# Patient Record
Sex: Female | Born: 1950
Health system: Southern US, Community
[De-identification: ages and names within clinical notes are randomized; demographics above are authoritative.]

## PROBLEM LIST (undated history)

## (undated) DIAGNOSIS — R232 Flushing: Secondary | ICD-10-CM

## (undated) DIAGNOSIS — C4490 Unspecified malignant neoplasm of skin, unspecified: Secondary | ICD-10-CM

## (undated) DIAGNOSIS — Z86006 Personal history of melanoma in-situ: Secondary | ICD-10-CM

## (undated) DIAGNOSIS — J301 Allergic rhinitis due to pollen: Secondary | ICD-10-CM

## (undated) DIAGNOSIS — D649 Anemia, unspecified: Secondary | ICD-10-CM

## (undated) DIAGNOSIS — R059 Cough, unspecified: Secondary | ICD-10-CM

## (undated) DIAGNOSIS — Z973 Presence of spectacles and contact lenses: Secondary | ICD-10-CM

## (undated) DIAGNOSIS — Z808 Family history of malignant neoplasm of other organs or systems: Secondary | ICD-10-CM

## (undated) DIAGNOSIS — F3289 Other specified depressive episodes: Secondary | ICD-10-CM

## (undated) DIAGNOSIS — G4700 Insomnia, unspecified: Secondary | ICD-10-CM

## (undated) DIAGNOSIS — F329 Major depressive disorder, single episode, unspecified: Secondary | ICD-10-CM

## (undated) DIAGNOSIS — C50919 Malignant neoplasm of unspecified site of unspecified female breast: Secondary | ICD-10-CM

## (undated) DIAGNOSIS — G2581 Restless legs syndrome: Secondary | ICD-10-CM

## (undated) DIAGNOSIS — T7840XA Allergy, unspecified, initial encounter: Secondary | ICD-10-CM

## (undated) DIAGNOSIS — K219 Gastro-esophageal reflux disease without esophagitis: Secondary | ICD-10-CM

## (undated) DIAGNOSIS — R635 Abnormal weight gain: Secondary | ICD-10-CM

## (undated) DIAGNOSIS — M722 Plantar fascial fibromatosis: Secondary | ICD-10-CM

## (undated) DIAGNOSIS — IMO0002 Reserved for concepts with insufficient information to code with codable children: Secondary | ICD-10-CM

## (undated) DIAGNOSIS — C439 Malignant melanoma of skin, unspecified: Secondary | ICD-10-CM

## (undated) DIAGNOSIS — IMO0001 Reserved for inherently not codable concepts without codable children: Secondary | ICD-10-CM

## (undated) DIAGNOSIS — R05 Cough: Secondary | ICD-10-CM

## (undated) HISTORY — DX: Unspecified malignant neoplasm of skin, unspecified: C44.90

## (undated) HISTORY — PX: TONSILLECTOMY: SUR1361

## (undated) HISTORY — DX: Insomnia, unspecified: G47.00

## (undated) HISTORY — DX: Allergic rhinitis due to pollen: J30.1

## (undated) HISTORY — DX: Allergy, unspecified, initial encounter: T78.40XA

## (undated) HISTORY — DX: Cough, unspecified: R05.9

## (undated) HISTORY — DX: Other specified depressive episodes: F32.89

## (undated) HISTORY — DX: Major depressive disorder, single episode, unspecified: F32.9

## (undated) HISTORY — DX: Family history of malignant neoplasm of other organs or systems: Z80.8

## (undated) HISTORY — DX: Personal history of melanoma in-situ: Z86.006

## (undated) HISTORY — PX: COLONOSCOPY: SHX174

## (undated) HISTORY — DX: Cough: R05

## (undated) HISTORY — DX: Abnormal weight gain: R63.5

## (undated) HISTORY — DX: Plantar fascial fibromatosis: M72.2

## (undated) HISTORY — DX: Malignant neoplasm of unspecified site of unspecified female breast: C50.919

## (undated) HISTORY — PX: MELANOMA EXCISION: SHX5266

## (undated) HISTORY — DX: Malignant melanoma of skin, unspecified: C43.9

## (undated) HISTORY — DX: Flushing: R23.2

## (undated) HISTORY — DX: Reserved for inherently not codable concepts without codable children: IMO0001

## (undated) HISTORY — DX: Restless legs syndrome: G25.81

## (undated) HISTORY — DX: Reserved for concepts with insufficient information to code with codable children: IMO0002

---

## 1997-12-06 ENCOUNTER — Other Ambulatory Visit: Admission: RE | Admit: 1997-12-06 | Discharge: 1997-12-06 | Payer: Self-pay | Admitting: *Deleted

## 1998-03-10 DIAGNOSIS — C439 Malignant melanoma of skin, unspecified: Secondary | ICD-10-CM

## 1998-03-10 HISTORY — DX: Malignant melanoma of skin, unspecified: C43.9

## 1998-03-31 ENCOUNTER — Encounter: Payer: Self-pay | Admitting: Family Medicine

## 1998-03-31 ENCOUNTER — Ambulatory Visit (HOSPITAL_COMMUNITY): Admission: RE | Admit: 1998-03-31 | Discharge: 1998-03-31 | Payer: Self-pay | Admitting: Family Medicine

## 1998-05-08 DIAGNOSIS — D229 Melanocytic nevi, unspecified: Secondary | ICD-10-CM

## 1998-05-08 HISTORY — DX: Melanocytic nevi, unspecified: D22.9

## 1999-05-20 ENCOUNTER — Ambulatory Visit (HOSPITAL_COMMUNITY): Admission: RE | Admit: 1999-05-20 | Discharge: 1999-05-20 | Payer: Self-pay | Admitting: Family Medicine

## 1999-05-20 ENCOUNTER — Encounter: Payer: Self-pay | Admitting: Family Medicine

## 2000-01-04 ENCOUNTER — Other Ambulatory Visit: Admission: RE | Admit: 2000-01-04 | Discharge: 2000-01-04 | Payer: Self-pay | Admitting: *Deleted

## 2001-02-14 ENCOUNTER — Other Ambulatory Visit: Admission: RE | Admit: 2001-02-14 | Discharge: 2001-02-14 | Payer: Self-pay | Admitting: Obstetrics and Gynecology

## 2001-08-09 ENCOUNTER — Ambulatory Visit (HOSPITAL_COMMUNITY): Admission: RE | Admit: 2001-08-09 | Discharge: 2001-08-09 | Payer: Self-pay | Admitting: Family Medicine

## 2001-08-09 ENCOUNTER — Encounter: Payer: Self-pay | Admitting: Family Medicine

## 2002-06-20 ENCOUNTER — Other Ambulatory Visit: Admission: RE | Admit: 2002-06-20 | Discharge: 2002-06-20 | Payer: Self-pay | Admitting: Family Medicine

## 2002-07-31 ENCOUNTER — Ambulatory Visit (HOSPITAL_COMMUNITY): Admission: RE | Admit: 2002-07-31 | Discharge: 2002-07-31 | Payer: Self-pay | Admitting: Gastroenterology

## 2002-07-31 ENCOUNTER — Encounter: Payer: Self-pay | Admitting: Gastroenterology

## 2005-02-03 ENCOUNTER — Other Ambulatory Visit: Admission: RE | Admit: 2005-02-03 | Discharge: 2005-02-03 | Payer: Self-pay | Admitting: *Deleted

## 2005-08-07 ENCOUNTER — Emergency Department (HOSPITAL_COMMUNITY): Admission: EM | Admit: 2005-08-07 | Discharge: 2005-08-08 | Payer: Self-pay | Admitting: Emergency Medicine

## 2005-12-15 DIAGNOSIS — D039 Melanoma in situ, unspecified: Secondary | ICD-10-CM

## 2005-12-15 HISTORY — DX: Melanoma in situ, unspecified: D03.9

## 2006-02-17 ENCOUNTER — Other Ambulatory Visit: Admission: RE | Admit: 2006-02-17 | Discharge: 2006-02-17 | Payer: Self-pay | Admitting: *Deleted

## 2006-02-23 ENCOUNTER — Emergency Department (HOSPITAL_COMMUNITY): Admission: EM | Admit: 2006-02-23 | Discharge: 2006-02-24 | Payer: Self-pay | Admitting: Emergency Medicine

## 2007-06-27 ENCOUNTER — Other Ambulatory Visit: Admission: RE | Admit: 2007-06-27 | Discharge: 2007-06-27 | Payer: Self-pay | Admitting: Family Medicine

## 2008-04-22 ENCOUNTER — Encounter: Admission: RE | Admit: 2008-04-22 | Discharge: 2008-04-22 | Payer: Self-pay | Admitting: Family Medicine

## 2010-07-31 ENCOUNTER — Encounter: Payer: Self-pay | Admitting: Critical Care Medicine

## 2010-08-03 ENCOUNTER — Ambulatory Visit (INDEPENDENT_AMBULATORY_CARE_PROVIDER_SITE_OTHER): Payer: 59 | Admitting: Critical Care Medicine

## 2010-08-03 ENCOUNTER — Other Ambulatory Visit: Payer: 59

## 2010-08-03 ENCOUNTER — Encounter: Payer: Self-pay | Admitting: Critical Care Medicine

## 2010-08-03 DIAGNOSIS — F3289 Other specified depressive episodes: Secondary | ICD-10-CM

## 2010-08-03 DIAGNOSIS — R059 Cough, unspecified: Secondary | ICD-10-CM

## 2010-08-03 DIAGNOSIS — G4700 Insomnia, unspecified: Secondary | ICD-10-CM

## 2010-08-03 DIAGNOSIS — J301 Allergic rhinitis due to pollen: Secondary | ICD-10-CM

## 2010-08-03 DIAGNOSIS — F329 Major depressive disorder, single episode, unspecified: Secondary | ICD-10-CM

## 2010-08-03 DIAGNOSIS — R05 Cough: Secondary | ICD-10-CM

## 2010-08-03 DIAGNOSIS — J45991 Cough variant asthma: Secondary | ICD-10-CM | POA: Insufficient documentation

## 2010-08-03 DIAGNOSIS — K219 Gastro-esophageal reflux disease without esophagitis: Secondary | ICD-10-CM

## 2010-08-03 LAB — IGE: IgE (Immunoglobulin E), Serum: 1.9 IU/mL (ref 0.0–180.0)

## 2010-08-03 MED ORDER — OMEPRAZOLE 20 MG PO CPDR: 20.0000 mg | DELAYED_RELEASE_CAPSULE | Freq: Every day | ORAL | Status: DC

## 2010-08-03 NOTE — Progress Notes (Signed)
Subjective:    [name], female    DOB: 1950-06-14, 60 y.o.   MRN: [id number]  60 y.o.    WF referred for cough and wheezing.  Cough This is a chronic (January 2012) problem. The current episode started more than 1 month ago. The problem has been waxing and waning (comes and goes, and is dry). Episode frequency: when exacerbates is constant.  Last spell was may 2012. The cough is non-productive. Associated symptoms include shortness of breath and wheezing. Pertinent negatives include no chest pain, chills, ear congestion, ear pain, fever, headaches, heartburn, hemoptysis, myalgias, nasal congestion, postnasal drip, rash, rhinorrhea, sore throat, sweats or weight loss. Associated symptoms comments: Chest tightness, notes wheezing  No clearing of throat Weight is up 30# in 1 year No sneezing, eyes were itching. The symptoms are aggravated by cold air, pollens, fumes and lying down. She has tried a beta-agonist inhaler, steroid inhaler and oral steroids for the symptoms. The treatment provided significant relief. Her past medical history is significant for environmental allergies. There is no history of asthma, bronchiectasis, bronchitis, emphysema or pneumonia. Allergy testing 5 yrs ago, positive dust , grasses, weeds. No immunotherapy given    .  Past Medical History  Diagnosis Date  . Cough   . Weight gain   . Hot flashes   . Insomnia, unspecified   . Depressive disorder, not elsewhere classified   . Allergic rhinitis due to pollen   . Plantar fasciitis      Family History  Problem Relation Age of Onset  . Alcohol abuse Father   . Emphysema Mother   . Heart disease Mother   . Heart failure Mother   . Asthma Daughter   . Asthma Other     grandson  . Asthma Sister      History   Social History  . Marital Status: Married    Spouse Name: N/A    Number of Children: 2  . Years of Education: N/A   Occupational History  . Research scientist (medical)    Social  History Main Topics  . Smoking status: Former Smoker -- 0.2 packs/day for 6 years    Types: Cigarettes    Quit date: 01/25/1974  . Smokeless tobacco: Never Used  . Alcohol Use: Yes     1 glass wine monthly  . Drug Use: No  . Sexually Active: Not on file   Other Topics Concern  . Not on file   Social History Narrative  . No narrative on file     Allergies  Allergen Reactions  . Iodine Hives  . Sulfa Antibiotics Hives     Outpatient Prescriptions Prior to Visit  Medication Sig Dispense Refill  . cetirizine (ZYRTEC) 10 MG tablet Take 10 mg by mouth daily.        . chlorpheniramine-HYDROcodone (TUSSIONEX PENNKINETIC ER) 10-8 MG/5ML LQCR Take 5 mLs by mouth at bedtime as needed.        . Cholecalciferol (VITAMIN D) 1000 UNITS capsule Take 4,000 Units by mouth daily.       . DULoxetine (CYMBALTA) 60 MG capsule Take 60 mg by mouth daily.        Marland Kitchen estrogen, conjugated,-medroxyprogesterone (PREMPRO) 0.3-1.5 MG per tablet Take 1 tablet by mouth daily.        . mometasone (NASONEX) 50 MCG/ACT nasal spray Place 2 sprays into the nose daily.        Marland Kitchen zolpidem (AMBIEN) 10 MG tablet Take 10 mg by mouth at  bedtime.       . cyclobenzaprine (FLEXERIL) 10 MG tablet Take 10 mg by mouth at bedtime as needed.        Marland Kitchen azelastine (ASTELIN) 137 MCG/SPRAY nasal spray Place 1 spray into the nose 2 (two) times daily. Use in each nostril as directed       . budesonide-formoterol (SYMBICORT) 160-4.5 MCG/ACT inhaler Inhale 2 puffs into the lungs 2 (two) times daily.        . calcium carbonate (OS-CAL) 600 MG TABS Take 600 mg by mouth. As directed        . esomeprazole (NEXIUM) 40 MG capsule Take 40 mg by mouth daily before breakfast.           Review of Systems  Constitutional: Positive for activity change, appetite change, fatigue and unexpected weight change. Negative for fever, chills, weight loss and diaphoresis.       30# weight gain Stays tired all the time Snores and is recent  Not refreshed  in the morning  HENT: Positive for sinus pressure. Negative for hearing loss, ear pain, nosebleeds, congestion, sore throat, facial swelling, rhinorrhea, sneezing, mouth sores, trouble swallowing, neck pain, neck stiffness, dental problem, voice change, postnasal drip, tinnitus and ear discharge.   Eyes: Positive for itching. Negative for photophobia, discharge and visual disturbance.  Respiratory: Positive for cough, chest tightness, shortness of breath and wheezing. Negative for apnea, hemoptysis, choking and stridor.   Cardiovascular: Positive for leg swelling. Negative for chest pain and palpitations.  Gastrointestinal: Negative for heartburn, nausea, vomiting, abdominal pain, constipation, blood in stool and abdominal distention.  Genitourinary: Negative for dysuria, urgency, frequency, hematuria, flank pain, decreased urine volume and difficulty urinating.  Musculoskeletal: Positive for back pain. Negative for myalgias, joint swelling, arthralgias and gait problem.  Skin: Negative for color change, pallor and rash.  Neurological: Positive for tremors. Negative for dizziness, seizures, syncope, speech difficulty, weakness, light-headedness, numbness and headaches.  Hematological: Positive for environmental allergies. Negative for adenopathy. Does not bruise/bleed easily.  Psychiatric/Behavioral: Positive for confusion and sleep disturbance. Negative for agitation. The patient is nervous/anxious.        Objective:   Physical Exam Filed Vitals:   08/03/10 1013  BP: 116/74  Pulse: 71  Temp: 98 F (36.7 C)  TempSrc: Oral  Height: 5\' 5"  (1.651 m)  Weight: 175 lb 9.6 oz (79.652 kg)  SpO2: 96%    Gen: Pleasant, well-nourished, in no distress,  normal affect  ENT: No lesions,  mouth clear,  oropharynx clear, no postnasal drip  Neck: No JVD, no TMG, no carotid bruits  Lungs: No use of accessory muscles, no dullness to percussion, clear without rales or rhonchi  Cardiovascular: RRR,  heart sounds normal, no murmur or gallops, no peripheral edema  Abdomen: soft and NT, no HSM,  BS normal  Musculoskeletal: No deformities, no cyanosis or clubbing  Neuro: alert, non focal  Skin: Warm, no lesions or rashes      08/03/10 normal spirometry CXR NAD 6/12  Assessment & Plan:   Cough Cyclical cough , suspect reactive airway disease d/t GERD and allergic atopic factors Note spirometry normal today  Plan Stay on pulmicort Stop nexium Start omeprazole one daily Follow reflux diet A sleep study will be obtained Labs today: IgE level Return 6 weeks     Updated Medication List Outpatient Encounter Prescriptions as of 08/03/2010  Medication Sig Dispense Refill  . Azelastine HCl (ASTEPRO) 0.15 % SOLN Place 1 spray into the nose 2 (two) times  daily.        . budesonide (PULMICORT FLEXHALER) 180 MCG/ACT inhaler Inhale 2 puffs into the lungs 2 (two) times daily.        . cetirizine (ZYRTEC) 10 MG tablet Take 10 mg by mouth daily.        . chlorpheniramine-HYDROcodone (TUSSIONEX PENNKINETIC ER) 10-8 MG/5ML LQCR Take 5 mLs by mouth at bedtime as needed.        . Cholecalciferol (VITAMIN D) 1000 UNITS capsule Take 4,000 Units by mouth daily.       . DULoxetine (CYMBALTA) 60 MG capsule Take 60 mg by mouth daily.        Marland Kitchen estrogen, conjugated,-medroxyprogesterone (PREMPRO) 0.3-1.5 MG per tablet Take 1 tablet by mouth daily.        . mometasone (NASONEX) 50 MCG/ACT nasal spray Place 2 sprays into the nose daily.        Marland Kitchen zolpidem (AMBIEN) 10 MG tablet Take 10 mg by mouth at bedtime.       . cyclobenzaprine (FLEXERIL) 10 MG tablet Take 10 mg by mouth at bedtime as needed.        Marland Kitchen omeprazole (PRILOSEC) 20 MG capsule Take 1 capsule (20 mg total) by mouth daily.  30 capsule  6  . DISCONTD: azelastine (ASTELIN) 137 MCG/SPRAY nasal spray Place 1 spray into the nose 2 (two) times daily. Use in each nostril as directed       . DISCONTD: budesonide-formoterol (SYMBICORT) 160-4.5 MCG/ACT  inhaler Inhale 2 puffs into the lungs 2 (two) times daily.        Marland Kitchen DISCONTD: calcium carbonate (OS-CAL) 600 MG TABS Take 600 mg by mouth. As directed        . DISCONTD: esomeprazole (NEXIUM) 40 MG capsule Take 40 mg by mouth daily before breakfast.

## 2010-08-03 NOTE — Patient Instructions (Signed)
Stay on pulmicort Stop nexium Start omeprazole one daily Follow reflux diet A sleep study will be obtained Labs today: IgE level Return 6 weeks

## 2010-08-03 NOTE — Assessment & Plan Note (Addendum)
Cyclical cough , suspect reactive airway disease d/t GERD and allergic atopic factors Note spirometry normal today  Plan Stay on pulmicort Stop nexium Start omeprazole one daily Follow reflux diet A sleep study will be obtained Labs today: IgE level Return 6 weeks

## 2010-08-04 NOTE — Progress Notes (Signed)
Quick Note:  Call pt and tell her labs IgE levels are normal. No evidence allergies are driving her symptoms now. No change in medications based upon our recs at last OV  ______

## 2010-08-06 ENCOUNTER — Ambulatory Visit (HOSPITAL_BASED_OUTPATIENT_CLINIC_OR_DEPARTMENT_OTHER): Payer: 59 | Attending: Critical Care Medicine

## 2010-08-06 DIAGNOSIS — G4700 Insomnia, unspecified: Secondary | ICD-10-CM

## 2010-08-06 DIAGNOSIS — R0989 Other specified symptoms and signs involving the circulatory and respiratory systems: Secondary | ICD-10-CM | POA: Insufficient documentation

## 2010-08-06 DIAGNOSIS — G4710 Hypersomnia, unspecified: Secondary | ICD-10-CM | POA: Insufficient documentation

## 2010-08-06 DIAGNOSIS — R5381 Other malaise: Secondary | ICD-10-CM | POA: Insufficient documentation

## 2010-08-06 DIAGNOSIS — R0609 Other forms of dyspnea: Secondary | ICD-10-CM | POA: Insufficient documentation

## 2010-08-06 DIAGNOSIS — G4734 Idiopathic sleep related nonobstructive alveolar hypoventilation: Secondary | ICD-10-CM | POA: Insufficient documentation

## 2010-08-06 DIAGNOSIS — R51 Headache: Secondary | ICD-10-CM | POA: Insufficient documentation

## 2010-08-07 NOTE — Progress Notes (Signed)
Quick Note:  Called, spoke with pt. She is aware of lab results and recs per PW. She verbalized understanding of this. ______

## 2010-08-13 DIAGNOSIS — R0609 Other forms of dyspnea: Secondary | ICD-10-CM

## 2010-08-13 DIAGNOSIS — R0989 Other specified symptoms and signs involving the circulatory and respiratory systems: Secondary | ICD-10-CM

## 2010-08-13 DIAGNOSIS — R5381 Other malaise: Secondary | ICD-10-CM

## 2010-08-13 DIAGNOSIS — G4710 Hypersomnia, unspecified: Secondary | ICD-10-CM

## 2010-08-13 DIAGNOSIS — R51 Headache: Secondary | ICD-10-CM

## 2010-08-13 DIAGNOSIS — R5383 Other fatigue: Secondary | ICD-10-CM

## 2010-08-13 DIAGNOSIS — G4734 Idiopathic sleep related nonobstructive alveolar hypoventilation: Secondary | ICD-10-CM

## 2010-08-13 NOTE — Procedures (Signed)
NAME:  Theresa Huff, Theresa Huff NO.:  [id number]  MEDICAL RECORD NO.:  [id number]          PATIENT TYPE:  OUT  LOCATION:  SLEEP CENTER                 FACILITY:  [facility]  PHYSICIAN:  Oretha Milch, MD      DATE OF BIRTH:  August 18, 1950  DATE OF STUDY:  08/06/2010                           NOCTURNAL POLYSOMNOGRAM  REFERRING PHYSICIAN:  Charlcie Cradle. Delford Field, MD, FCCP  INDICATION FOR STUDY:  Ms. Petrakis is a 60 year old woman with excessive daytime somnolence and fatigue, loud snoring, restless sleep, and morning headaches.  At the time of this study, she weighed 170 pounds with a height of 5 feet and 5 inches, BMI of 28, neck size 14 inches.  EPWORTH SLEEPINESS SCORE:  19.  This nocturnal polysomnogram was performed with a sleep technologist in attendance.  EEG, EOG, EMG, EKG, respiratory parameters, and then limb movements were recorded.  Sleep stages arousals, limb movements, and respiratory data were scored according to criteria laid out by the American Academy of Sleep Medicine.  MEDICATIONS:  Include Zyrtec, Cymbalta, Astepro, Ambien, Pulmicort, Prilosec, vitamin D.  Ambien was not taken the night of the study.  SLEEP ARCHITECTURE:  Lights out was at 10:53 p.m., lights on was at 5:12 a.m.  Total sleep time was 317 minutes with a sleep period time of 368 minutes and sleep efficiency of 84%.  Sleep latency was 10 minutes. Latency to REM sleep was 91 minutes.  Wake after sleep onset was 53 minutes.  Sleep stages as a percentage of total sleep time was N1 13%, N2 58%, N3 0.6%, and REM sleep 28% (90 minutes).  Supine sleep accounted for 225 minutes and supine REM sleep for 77 minutes.  REM sleep was noted in 3 stages with the longest being around 4:30 a.m.  AROUSAL DATA:  There were 122 arousals with an arousal index of 23 events per hour.  Most of these arousals were spontaneous.  RESPIRATORY DATA:  There were 0 obstructive apneas, 4 central apneas, 0 mixed apneas, and 7  hypopneas with an apnea/hypopnea index of 2 events per hour, 50 respiratory effort related arousals (RERAs) were noted with an RDI of 11 events per hour.  Longest hypopnea was 35 seconds.  OXYGEN DATA:  The desaturation index was 2.46 events per hour.  The lowest desaturation was 84%.  She spent 1-minute with a saturation less than 88%.  CARDIAC DATA:  No arrhythmias were noted.  The low heart rate was 32 beats per minute.  The high heart rate recorded was an artifact.  MOVEMENT-PARASOMNIA:  Periodic limb movement index was 0.8 events per hour, only one limb movement was associated with arousals.  DISCUSSION:  Normal sleep architecture with a good period of supine REM sleep, indicated of a good study.  She did not meet criteria for intervention.  IMPRESSIONS-RECOMMENDATIONS: 1. Predominant RERAs suggestive of upper airway resistance syndrome. 2. Mild sleep-related hypoxia, however, she did not meet criteria for     oxygen intervention. 3. No evidence of cardiac arrhythmias, significant limb movements, or     behavioral disturbance during sleep.  RECOMMENDATIONS:  Treatment options include weight loss, oral appliance or CPAP therapy. CPAP would be  recommended only if she is symptomatic. Rules of sleep hygiene should be discussed. Medications should be reviewed for sedative side effects. Other causes of daytime somnolence can be correlated clinically.     Oretha Milch, MD Electronically Signed    RVA/MEDQ  D:  08/13/2010 10:41:00  T:  08/13/2010 21:49:30  Job:  [id number]

## 2010-08-21 NOTE — Progress Notes (Signed)
Quick Note:  Pt has appt to see VS on 09/09/10. ______

## 2010-09-09 ENCOUNTER — Ambulatory Visit (INDEPENDENT_AMBULATORY_CARE_PROVIDER_SITE_OTHER): Payer: 59 | Admitting: Pulmonary Disease

## 2010-09-09 ENCOUNTER — Encounter: Payer: Self-pay | Admitting: Pulmonary Disease

## 2010-09-09 VITALS — BP 108/64 | HR 68 | Temp 98.1°F | Wt 176.6 lb

## 2010-09-09 DIAGNOSIS — G4733 Obstructive sleep apnea (adult) (pediatric): Secondary | ICD-10-CM

## 2010-09-09 DIAGNOSIS — G4700 Insomnia, unspecified: Secondary | ICD-10-CM

## 2010-09-09 DIAGNOSIS — G2581 Restless legs syndrome: Secondary | ICD-10-CM

## 2010-09-09 HISTORY — DX: Restless legs syndrome: G25.81

## 2010-09-09 NOTE — Assessment & Plan Note (Signed)
She has complaints consistent with restless leg syndrome.  She has prior history of iron deficiency.  Will check her lab tests, and start iron supplementation if her iron levels are low.  Otherwise, would try to optimize her therapy for sleep apnea first.  Of note is that she is using cymbalta.  This class of anti-depressant medications can be associated with restless legs syndrome.  Will defer to primary care whether this can be switched to a different class of anti-depressant medication, or if she can be weaned of this medication.

## 2010-09-09 NOTE — Assessment & Plan Note (Addendum)
She has mild sleep apnea.  She has sleep disruption and daytime sleepiness.  I have reviewed his sleep test results with the patient.  Explained how sleep apnea can affect the patient's health.  Driving precautions and importance of weight loss were discussed.  Treatment options for sleep apnea were reviewed.  She would like to try an oral appliance.  I will refer to Dr. Althea Grimmer to get assessed for this.  Explained that she would need to have further testing with device in place to document efficacy of therapy.

## 2010-09-09 NOTE — Assessment & Plan Note (Signed)
Much of her insomnia symptoms could actually be related to sleep apnea and restless leg syndrome.  Will get these stabilized first, and then determine if she needs to continue with chronic sleep aide therapy.

## 2010-09-09 NOTE — Patient Instructions (Signed)
Will arrange for dental evaluation to assess for oral appliance to treat sleep apnea Lab tests today>>will call with results Follow up in 3 months

## 2010-09-09 NOTE — Progress Notes (Signed)
Subjective:    [name], female    DOB: [birth year], 60 y.o.   MRN: [id number]  HPI  60 yo female with sleep problems.  She has noticed trouble with her sleep for years.  She has trouble falling asleep and staying asleep.  She is then sleepy during the day.  She has been using Theresa Huff regularly for some time, but still has trouble.  She has not used anything else to help her sleep.  She goes to bed at 10 pm.  She takes ambien at 930 pm.  She falls asleep in about 30 minutes.  She wakes up once, but then quickly falls back to sleep.  She gets out of bed at 630 am on weekdays, but sleeps in to 730 am on weekends.  She feels tired in the morning, and gets frequent headaches.  She drinks a cup of tea in the morning.  She will nap for up to 2 hours on the weekend, but this does not seem to help much.  She has gained about 40 lbs over the past several months.  She snores, and this has been getting worse.  She has more trouble sleeping on her back.  She used to grind her teeth, and had used a mouth guard before.  She does talk in her sleep.  She gets terrible feelings in her legs before going to sleep.  These happen several times per week, and can cause trouble falling asleep.  The patient denies sleep walking, or nightmares.  The patient denies sleep hallucinations, sleep paralysis, or cataplexy.  She quit smoking.  She does not drink much alcohol.  She had her tonsils removed.  There is no history of thyroid disease.  She has not been using tussionex, or flexeril recently.  She takes cymbalta in the morning.  Epworth score 19 out of 24.  PSG 08/06/10>>AHI 2, RDI 11, SpO2 low 84%, PLMI 0.8.  Past Medical History  Diagnosis Date  . Cough   . Weight gain   . Hot flashes   . Insomnia, unspecified   . Depressive disorder, not elsewhere classified   . Allergic rhinitis due to pollen   . Plantar fasciitis   . OSA (obstructive sleep apnea) 09/09/2010  . Restless leg syndrome  09/09/2010     Family History  Problem Relation Age of Onset  . Alcohol abuse Father   . Emphysema Mother   . Heart disease Mother   . Heart failure Mother   . Asthma Theresa Huff   . Asthma Theresa Huff     Theresa Huff  . Asthma Sister      History   Social History  . Marital Status: Married    Spouse Name: Theresa Huff    Number of Children: 2  . Years of Education: Theresa Huff   Occupational History  . Research scientist (medical)    Social History Main Topics  . Smoking status: Former Smoker -- 0.2 packs/day for 6 years    Types: Cigarettes    Quit date: 01/25/1974  . Smokeless tobacco: Never Used  . Alcohol Use: Yes     5 glasses per month  . Drug Use: No  . Sexually Active: Not on file   Theresa Huff Topics Concern  . Not on file   Social History Narrative  . No narrative on file     Allergies  Allergen Reactions  . Iodine Hives  . Sulfa Antibiotics Hives    Review of Systems  HENT: Positive for congestion.   Respiratory:  Positive for cough and shortness of breath.   Musculoskeletal: Positive for joint swelling.  Neurological: Positive for headaches.       Objective:   Physical Exam  BP 108/64  Pulse 68  Temp(Src) 98.1 F (36.7 C) (Oral)  Wt 176 lb 9.6 oz (80.105 kg)  SpO2 98%  General - Obese, health HEENT - PERRLA, EOMI, no sinus tenderness, narrow nasal angles, MP 3, scalloped tongue border, slight over-bite, decreased AP diameter oropharynx, no LAN, no thyromegaly Cardiac - s1s2 regular, no murmur, pulses symmetric Chest - no wheeze/rales/dullness Abd - obese, soft, nontender Ext - no e/c/c Neuro - normal strength, CN intact Psych - normal mood/behavior     Assessment & Plan:   OSA (obstructive sleep apnea) She has mild sleep apnea.  She has sleep disruption and daytime sleepiness.  I have reviewed his sleep test results with the patient.  Explained how sleep apnea can affect the patient's health.  Driving precautions and importance of weight loss were discussed.  Treatment  options for sleep apnea were reviewed.  She would like to try an oral appliance.  I will refer to Theresa Huff to get assessed for this.  Explained that she would need to have further testing with device in place to document efficacy of therapy.  Restless leg syndrome She has complaints consistent with restless leg syndrome.  She has prior history of iron deficiency.  Will check her lab tests, and start iron supplementation if her iron levels are low.  Otherwise, would try to optimize her therapy for sleep apnea first.  Of note is that she is using cymbalta.  This class of anti-depressant medications can be associated with restless legs syndrome.  Will defer to primary care whether this can be switched to a different class of anti-depressant medication, or if she can be weaned of this medication.  Insomnia, unspecified Much of her insomnia symptoms could actually be related to sleep apnea and restless leg syndrome.  Will get these stabilized first, and then determine if she needs to continue with chronic sleep aide therapy.    Updated Medication List Outpatient Encounter Prescriptions as of 09/09/2010  Medication Sig Dispense Refill  . Azelastine HCl (ASTEPRO) 0.15 % SOLN Place 1 spray into the nose 2 (two) times daily.        . budesonide (PULMICORT FLEXHALER) 180 MCG/ACT inhaler Inhale 2 puffs into the lungs 2 (two) times daily.        . cetirizine (ZYRTEC) 10 MG tablet Take 10 mg by mouth daily.        . chlorpheniramine-HYDROcodone (TUSSIONEX PENNKINETIC ER) 10-8 MG/5ML LQCR Take 5 mLs by mouth at bedtime as needed.        . Cholecalciferol (VITAMIN D) 1000 UNITS capsule Take 4,000 Units by mouth daily.       . cyclobenzaprine (FLEXERIL) 10 MG tablet Take 10 mg by mouth at bedtime as needed.        . DULoxetine (CYMBALTA) 60 MG capsule Take 60 mg by mouth daily.        Marland Kitchen esomeprazole (NEXIUM) 40 MG capsule Take 40 mg by mouth daily.        Marland Kitchen estrogen, conjugated,-medroxyprogesterone  (PREMPRO) 0.3-1.5 MG per tablet Take 1 tablet by mouth daily.        . mometasone (NASONEX) 50 MCG/ACT nasal spray Place 2 sprays into the nose daily.        Marland Kitchen omeprazole (PRILOSEC) 20 MG capsule Take 1 capsule (20 mg total)  by mouth daily.  30 capsule  6  . zolpidem (AMBIEN) 10 MG tablet Take 10 mg by mouth at bedtime.

## 2010-09-14 ENCOUNTER — Other Ambulatory Visit (INDEPENDENT_AMBULATORY_CARE_PROVIDER_SITE_OTHER): Payer: 59

## 2010-09-14 ENCOUNTER — Other Ambulatory Visit: Payer: Self-pay | Admitting: *Deleted

## 2010-09-14 ENCOUNTER — Ambulatory Visit (INDEPENDENT_AMBULATORY_CARE_PROVIDER_SITE_OTHER): Payer: 59 | Admitting: Critical Care Medicine

## 2010-09-14 ENCOUNTER — Encounter: Payer: Self-pay | Admitting: Critical Care Medicine

## 2010-09-14 ENCOUNTER — Other Ambulatory Visit: Payer: Self-pay | Admitting: Pulmonary Disease

## 2010-09-14 DIAGNOSIS — G2581 Restless legs syndrome: Secondary | ICD-10-CM

## 2010-09-14 DIAGNOSIS — R05 Cough: Secondary | ICD-10-CM

## 2010-09-14 DIAGNOSIS — J45909 Unspecified asthma, uncomplicated: Secondary | ICD-10-CM

## 2010-09-14 DIAGNOSIS — R059 Cough, unspecified: Secondary | ICD-10-CM

## 2010-09-14 LAB — CBC WITH DIFFERENTIAL/PLATELET
Basophils Relative: 0.8 % (ref 0.0–3.0)
Eosinophils Relative: 6.6 % — ABNORMAL HIGH (ref 0.0–5.0)
Lymphocytes Relative: 37.4 % (ref 12.0–46.0)
Monocytes Relative: 9.4 % (ref 3.0–12.0)
Neutrophils Relative %: 45.8 % (ref 43.0–77.0)
Platelets: 257 10*3/uL (ref 150.0–400.0)
RBC: 4.55 Mil/uL (ref 3.87–5.11)
WBC: 6.6 10*3/uL (ref 4.5–10.5)

## 2010-09-14 MED ORDER — BUDESONIDE 180 MCG/ACT IN AEPB: 2.0000 | INHALATION_SPRAY | Freq: Every day | RESPIRATORY_TRACT | Status: DC

## 2010-09-14 NOTE — Progress Notes (Signed)
Subjective:    [name], female    DOB: Feb 15, 1950, 60 y.o.   MRN: [id number]  60 y.o.    WF referred for cough and wheezing.  Cough This is a chronic (January 2012) problem. The current episode started more than 1 month ago. The problem has been rapidly improving. The cough is non-productive. Associated symptoms include shortness of breath. Pertinent negatives include no chest pain, chills, ear congestion, ear pain, fever, headaches, heartburn, hemoptysis, myalgias, nasal congestion, postnasal drip, rash, rhinorrhea, sore throat, sweats, weight loss or wheezing. Associated symptoms comments: No clearing of throat Weight is up 30# in 1 year No sneezing, eyes were itching. The symptoms are aggravated by cold air, pollens, fumes and lying down. She has tried a beta-agonist inhaler, steroid inhaler and oral steroids for the symptoms. The treatment provided significant relief. Her past medical history is significant for environmental allergies. There is no history of asthma, bronchiectasis, bronchitis, emphysema or pneumonia. Allergy testing 5 yrs ago, positive dust , grasses, weeds. No immunotherapy given   09/14/10 Since last ov cough is better, doe only     Pt denies any significant sore throat, nasal congestion or excess secretions, fever, chills, sweats, unintended weight loss, pleurtic or exertional chest pain, orthopnea PND, or leg swelling Pt denies any increase in rescue therapy over baseline, denies waking up needing it or having any early am or nocturnal exacerbations of coughing/wheezing/or dyspnea. Pt also denies any obvious fluctuation in symptoms with  weather or environmental change or other alleviating or aggravating factors   .  Past Medical History  Diagnosis Date  . Cough   . Weight gain   . Hot flashes   . Insomnia, unspecified   . Depressive disorder, not elsewhere classified   . Allergic rhinitis due to pollen   . Plantar fasciitis   . OSA (obstructive  sleep apnea) 09/09/2010  . Restless leg syndrome 09/09/2010     Family History  Problem Relation Age of Onset  . Alcohol abuse Father   . Emphysema Mother   . Heart disease Mother   . Heart failure Mother   . Asthma Daughter   . Asthma Other     grandson  . Asthma Sister      History   Social History  . Marital Status: Married    Spouse Name: N/A    Number of Children: 2  . Years of Education: N/A   Occupational History  . Research scientist (medical)    Social History Main Topics  . Smoking status: Former Smoker -- 0.2 packs/day for 6 years    Types: Cigarettes    Quit date: 01/25/1974  . Smokeless tobacco: Never Used  . Alcohol Use: Yes     5 glasses per month  . Drug Use: No  . Sexually Active: Not on file   Other Topics Concern  . Not on file   Social History Narrative  . No narrative on file     Allergies  Allergen Reactions  . Iodine Hives  . Sulfa Antibiotics Hives     Outpatient Prescriptions Prior to Visit  Medication Sig Dispense Refill  . Azelastine HCl (ASTEPRO) 0.15 % SOLN Place 1 spray into the nose 2 (two) times daily.        . cetirizine (ZYRTEC) 10 MG tablet Take 10 mg by mouth daily.        . chlorpheniramine-HYDROcodone (TUSSIONEX PENNKINETIC ER) 10-8 MG/5ML LQCR Take 5 mLs by mouth at bedtime as needed.        Marland Kitchen  Cholecalciferol (VITAMIN D) 1000 UNITS capsule Take 4,000 Units by mouth daily.       . cyclobenzaprine (FLEXERIL) 10 MG tablet Take 10 mg by mouth at bedtime as needed.        . DULoxetine (CYMBALTA) 60 MG capsule Take 60 mg by mouth daily.        Marland Kitchen estrogen, conjugated,-medroxyprogesterone (PREMPRO) 0.3-1.5 MG per tablet Take 1 tablet by mouth daily.        . mometasone (NASONEX) 50 MCG/ACT nasal spray Place 2 sprays into the nose daily.        Marland Kitchen omeprazole (PRILOSEC) 20 MG capsule Take 1 capsule (20 mg total) by mouth daily.  30 capsule  6  . zolpidem (AMBIEN) 10 MG tablet Take 10 mg by mouth at bedtime.       . budesonide (PULMICORT  FLEXHALER) 180 MCG/ACT inhaler Inhale 2 puffs into the lungs 2 (two) times daily.        Marland Kitchen esomeprazole (NEXIUM) 40 MG capsule Take 40 mg by mouth daily.           Review of Systems  Constitutional: Positive for activity change, appetite change, fatigue and unexpected weight change. Negative for fever, chills, weight loss and diaphoresis.       30# weight gain Stays tired all the time Snores and is recent  Not refreshed in the morning  HENT: Positive for sinus pressure. Negative for hearing loss, ear pain, nosebleeds, congestion, sore throat, facial swelling, rhinorrhea, sneezing, mouth sores, trouble swallowing, neck pain, neck stiffness, dental problem, voice change, postnasal drip, tinnitus and ear discharge.   Eyes: Positive for itching. Negative for photophobia, discharge and visual disturbance.  Respiratory: Positive for cough, chest tightness and shortness of breath. Negative for apnea, hemoptysis, choking, wheezing and stridor.   Cardiovascular: Positive for leg swelling. Negative for chest pain and palpitations.  Gastrointestinal: Negative for heartburn, nausea, vomiting, abdominal pain, constipation, blood in stool and abdominal distention.  Genitourinary: Negative for dysuria, urgency, frequency, hematuria, flank pain, decreased urine volume and difficulty urinating.  Musculoskeletal: Positive for back pain. Negative for myalgias, joint swelling, arthralgias and gait problem.  Skin: Negative for color change, pallor and rash.  Neurological: Positive for tremors. Negative for dizziness, seizures, syncope, speech difficulty, weakness, light-headedness, numbness and headaches.  Hematological: Positive for environmental allergies. Negative for adenopathy. Does not bruise/bleed easily.  Psychiatric/Behavioral: Positive for confusion and sleep disturbance. Negative for agitation. The patient is nervous/anxious.        Objective:   Physical Exam  Filed Vitals:   09/14/10 1626  BP:  128/76  Pulse: 65  Temp: 97.9 F (36.6 C)  TempSrc: Oral  Height: 5' 5.5" (1.664 m)  Weight: 175 lb 3.2 oz (79.47 kg)  SpO2: 98%    Gen: Pleasant, well-nourished, in no distress,  normal affect  ENT: No lesions,  mouth clear,  oropharynx clear, no postnasal drip  Neck: No JVD, no TMG, no carotid bruits  Lungs: No use of accessory muscles, no dullness to percussion, clear without rales or rhonchi  Cardiovascular: RRR, heart sounds normal, no murmur or gallops, no peripheral edema  Abdomen: soft and NT, no HSM,  BS normal  Musculoskeletal: No deformities, no cyanosis or clubbing  Neuro: alert, non focal  Skin: Warm, no lesions or rashes      08/03/10 normal spirometry CXR NAD 6/12  Assessment & Plan:   Asthma, cough variant Cyclical cough , suspect reactive airway disease d/t GERD and allergic atopic factors and reactive  airways disease  Normal spirometry 08/03/10  Plan Reduce pulmicort to two puff daily Return in   6 months     Updated Medication List Outpatient Encounter Prescriptions as of 09/14/2010  Medication Sig Dispense Refill  . Azelastine HCl (ASTEPRO) 0.15 % SOLN Place 1 spray into the nose 2 (two) times daily.        . budesonide (PULMICORT FLEXHALER) 180 MCG/ACT inhaler Inhale 2 puffs into the lungs daily.      . cetirizine (ZYRTEC) 10 MG tablet Take 10 mg by mouth daily.        . chlorpheniramine-HYDROcodone (TUSSIONEX PENNKINETIC ER) 10-8 MG/5ML LQCR Take 5 mLs by mouth at bedtime as needed.        . Cholecalciferol (VITAMIN D) 1000 UNITS capsule Take 4,000 Units by mouth daily.       . cyclobenzaprine (FLEXERIL) 10 MG tablet Take 10 mg by mouth at bedtime as needed.        . DULoxetine (CYMBALTA) 60 MG capsule Take 60 mg by mouth daily.        Marland Kitchen estrogen, conjugated,-medroxyprogesterone (PREMPRO) 0.3-1.5 MG per tablet Take 1 tablet by mouth daily.        . mometasone (NASONEX) 50 MCG/ACT nasal spray Place 2 sprays into the nose daily.        Marland Kitchen  omeprazole (PRILOSEC) 20 MG capsule Take 1 capsule (20 mg total) by mouth daily.  30 capsule  6  . zolpidem (AMBIEN) 10 MG tablet Take 10 mg by mouth at bedtime.       Marland Kitchen DISCONTD: budesonide (PULMICORT FLEXHALER) 180 MCG/ACT inhaler Inhale 2 puffs into the lungs 2 (two) times daily.        Marland Kitchen DISCONTD: esomeprazole (NEXIUM) 40 MG capsule Take 40 mg by mouth daily.

## 2010-09-14 NOTE — Patient Instructions (Signed)
Reduce pulmicort to two puff daily, stay on lowest effective dose to keep cough away No other medication changes Return 6 months

## 2010-09-15 LAB — COMPREHENSIVE METABOLIC PANEL
ALT: 21 U/L (ref 0–35)
BUN: 14 mg/dL (ref 6–23)
CO2: 30 mEq/L (ref 19–32)
Creatinine, Ser: 0.8 mg/dL (ref 0.4–1.2)
GFR: 75.11 mL/min (ref >60.00)
Glucose, Bld: 94 mg/dL (ref 70–99)
Total Bilirubin: 0.4 mg/dL (ref 0.3–1.2)

## 2010-09-15 LAB — FERRITIN: Ferritin: 32.3 ng/mL (ref 10.0–291.0)

## 2010-09-15 LAB — IRON: Iron: 35 ug/dL — ABNORMAL LOW (ref 42–145)

## 2010-09-15 NOTE — Assessment & Plan Note (Signed)
Cyclical cough , suspect reactive airway disease d/t GERD and allergic atopic factors and reactive airways disease  Normal spirometry 08/03/10  Plan Reduce pulmicort to two puff daily Return in   6 months

## 2010-09-16 ENCOUNTER — Telehealth: Payer: Self-pay | Admitting: Pulmonary Disease

## 2010-09-16 DIAGNOSIS — G2581 Restless legs syndrome: Secondary | ICD-10-CM

## 2010-09-16 MED ORDER — FERROUS SULFATE 325 (65 FE) MG PO TABS: 325.0000 mg | ORAL_TABLET | Freq: Two times a day (BID) | ORAL | Status: DC

## 2010-09-16 NOTE — Telephone Encounter (Signed)
Labs from 09/14/10 reviewed.  Ferritin level low.  Advised pt to start ferrous sulfate 325 mg bid for restless leg syndrome.  Will plan to repeat labs at next visit.

## 2012-06-15 ENCOUNTER — Other Ambulatory Visit: Payer: Self-pay | Admitting: Family Medicine

## 2012-06-15 ENCOUNTER — Ambulatory Visit
Admission: RE | Admit: 2012-06-15 | Discharge: 2012-06-15 | Disposition: A | Discharge status: Home / Self Care | Payer: 59 | Source: Ambulatory Visit | Attending: Family Medicine | Admitting: Family Medicine

## 2012-06-15 DIAGNOSIS — M79671 Pain in right foot: Secondary | ICD-10-CM

## 2013-12-14 ENCOUNTER — Ambulatory Visit (INDEPENDENT_AMBULATORY_CARE_PROVIDER_SITE_OTHER): Payer: 59 | Admitting: Neurology

## 2013-12-14 ENCOUNTER — Encounter: Payer: Self-pay | Admitting: Neurology

## 2013-12-14 VITALS — BP 102/70 | HR 70 | Ht 65.0 in | Wt 161.0 lb

## 2013-12-14 DIAGNOSIS — M791 Myalgia, unspecified site: Secondary | ICD-10-CM

## 2013-12-14 DIAGNOSIS — R252 Cramp and spasm: Secondary | ICD-10-CM

## 2013-12-14 DIAGNOSIS — R251 Tremor, unspecified: Secondary | ICD-10-CM

## 2013-12-14 DIAGNOSIS — R202 Paresthesia of skin: Secondary | ICD-10-CM

## 2013-12-14 DIAGNOSIS — G250 Essential tremor: Secondary | ICD-10-CM

## 2013-12-14 DIAGNOSIS — G243 Spasmodic torticollis: Secondary | ICD-10-CM

## 2013-12-14 LAB — CK: CK TOTAL: 51 U/L (ref 7–177)

## 2013-12-14 LAB — TSH: TSH: 1.214 u[IU]/mL (ref 0.350–4.500)

## 2013-12-14 MED ORDER — PRIMIDONE 50 MG PO TABS
50.0000 mg | ORAL_TABLET | Freq: Every day | ORAL | Status: DC
Start: 2013-12-14 — End: 2013-12-17

## 2013-12-14 NOTE — Patient Instructions (Signed)
1. Start Primidone 50 mg tablets. Take 1/2 tablet for 4 nights, then increase to 1 tablet daily.  2. We will call you with appt for your EMG.  3. Your provider has requested that you have labwork completed today. Please go to [facility] on the first floor of this building before leaving the office today.

## 2013-12-14 NOTE — Progress Notes (Signed)
Subjective:   Theresa Huff was seen in consultation in the movement disorder clinic at the request of Gara Kroner, MD.  The evaluation is for tremor.  Pt reports head and hand tremor for the last 5 years.  She is not sure which started first, as she didn't even realize her head was shaking until her sister pointed it out.  Head shakes in the "yes" direction.  Both hands tremor, but the L is worse than the right and she is L handed.  She notices it when holding a camera.  She also notes cramping in the hands and they will close up.    There is a family hx of tremor in paternal cousins and paternal aunt.  It is independent of time of day or cold/hot weather.  That has been going on 1.5 years.  No feet cramping.    She has been on cymbalta for 5-6 years and doesn't know if it contributes to tremor.  Affected by caffeine:  No. (chai tea, starbucks, daily) Affected by alcohol:  unknown Affected by stress:  No. Affected by fatigue:  No. Spills soup if on spoon:  No. Spills glass of liquid if full:  No. Affects ADL's (tying shoes, brushing teeth, etc):  No.  Current/Previously tried tremor medications: no  Current medications that may exacerbate tremor:  ? cymbalta  Outside reports reviewed: historical medical records, lab reports and referral letter/letters.  Allergies  Allergen Reactions  . Codeine Nausea And Vomiting  . Iodine Hives  . Sulfa Antibiotics Hives    Outpatient Encounter Prescriptions as of 12/14/2013  Medication Sig  . CALCIUM PO Take by mouth daily.  . cetirizine (ZYRTEC) 10 MG tablet Take 10 mg by mouth daily.    . Cholecalciferol (VITAMIN D PO) Take 2,000 Units by mouth daily.  . DULoxetine (CYMBALTA) 60 MG capsule Take 30 mg by mouth daily.   Marland Kitchen estrogen, conjugated,-medroxyprogesterone (PREMPRO) 0.3-1.5 MG per tablet Take 1 tablet by mouth daily.  . Eszopiclone (ESZOPICLONE) 3 MG TABS Take 3 mg by mouth at bedtime. Take immediately before bedtime  . mometasone  (NASONEX) 50 MCG/ACT nasal spray Place 2 sprays into the nose daily.    Marland Kitchen omeprazole (PRILOSEC) 20 MG capsule Take 1 capsule (20 mg total) by mouth daily.  . Vitamin D, Ergocalciferol, (DRISDOL) 50000 UNITS CAPS capsule Take 50,000 Units by mouth every 7 (seven) days.  . [DISCONTINUED] Azelastine HCl (ASTEPRO) 0.15 % SOLN Place 1 spray into the nose 2 (two) times daily.    . [DISCONTINUED] Cholecalciferol (VITAMIN D) 1000 UNITS capsule Take 4,000 Units by mouth daily.   . [DISCONTINUED] budesonide (PULMICORT FLEXHALER) 180 MCG/ACT inhaler Inhale 2 puffs into the lungs daily.  . [DISCONTINUED] chlorpheniramine-HYDROcodone (TUSSIONEX PENNKINETIC ER) 10-8 MG/5ML LQCR Take 5 mLs by mouth at bedtime as needed.    . [DISCONTINUED] cyclobenzaprine (FLEXERIL) 10 MG tablet Take 10 mg by mouth at bedtime as needed.    . [DISCONTINUED] estrogen, conjugated,-medroxyprogesterone (PREMPRO) 0.3-1.5 MG per tablet Take 1 tablet by mouth daily.    . [DISCONTINUED] ferrous sulfate 325 (65 FE) MG tablet Take 1 tablet (325 mg total) by mouth 2 (two) times daily.  . [DISCONTINUED] zolpidem (AMBIEN) 10 MG tablet Take 10 mg by mouth at bedtime.     Past Medical History  Diagnosis Date  . Cough   . Weight gain   . Hot flashes   . Insomnia, unspecified   . Depressive disorder, not elsewhere classified   . Allergic rhinitis  due to pollen   . Plantar fasciitis   . OSA (obstructive sleep apnea) 09/09/2010    Pt denies on 12/14/2013  . Restless leg syndrome 09/09/2010    Past Surgical History  Procedure Laterality Date  . Tonsillectomy    . Melanoma excision      right leg, left arm    History   Social History  . Marital Status: Married    Spouse Name: N/A    Number of Children: 2  . Years of Education: N/A   Occupational History  . Scientist, water quality    Social History Main Topics  . Smoking status: Former Smoker -- 0.20 packs/day for 6 years    Types: Cigarettes    Quit date: 01/25/1974  .  Smokeless tobacco: Never Used  . Alcohol Use: Yes     Comment: 5 glasses per month  . Drug Use: No  . Sexual Activity: Not on file   Other Topics Concern  . Not on file   Social History Narrative    Family Status  Relation Status Death Age  . Father Deceased     ETOH  . Mother Deceased     heart disease, emphysema  . Sister Alive     asthma  . Sister Alive     healthy  . Brother Alive     healthy  . Daughter Alive     healthy  . Daughter Alive     thyroid disease    Review of Systems A complete 10 system ROS was obtained and was negative apart from what is mentioned.   Objective:   VITALS:   Filed Vitals:   12/14/13 0820  BP: 102/70  Pulse: 70  Height: 5\' 5"  (1.651 m)  Weight: 161 lb (73.029 kg)   Gen:  Appears stated age and in NAD. HEENT:  Normocephalic, atraumatic. The mucous membranes are moist. The superficial temporal arteries are without ropiness or tenderness.  The head is turned just slightly to the left and tilted just slightly to the left. Cardiovascular: Regular rate and rhythm. Lungs: Clear to auscultation bilaterally. Neck: There are no carotid bruits noted bilaterally.  There is mild hypertrophy of the right sternocleidomastoid.  NEUROLOGICAL:  Orientation:  The patient is alert and oriented x 3.  Recent and remote memory are intact.  Attention span and concentration are normal.  Able to name objects and repeat without trouble.  Fund of knowledge is appropriate Cranial nerves: There is good facial symmetry. The pupils are equal round and reactive to light bilaterally. Fundoscopic exam reveals clear disc margins bilaterally. Extraocular muscles are intact and visual fields are full to confrontational testing. Speech is fluent and clear. Soft palate rises symmetrically and there is no tongue deviation. Hearing is intact to conversational tone. Tone: Tone is good throughout. Sensation: Sensation is intact to light touch and pinprick throughout  (facial, trunk, extremities). Vibration is intact at the bilateral big toe. There is no extinction with double simultaneous stimulation. There is no sensory dermatomal level identified. Coordination:  The patient has no dysdiadichokinesia or dysmetria. Motor: Strength is 5/5 in the bilateral upper and lower extremities.  Shoulder shrug is equal bilaterally.  There is no pronator drift.  There are no fasciculations noted. DTR's: Deep tendon reflexes are 2/4 at the bilateral biceps, triceps, brachioradialis, patella and achilles.  Plantar responses are downgoing bilaterally. Gait and Station: The patient is able to ambulate without difficulty. The patient is able to heel toe walk without any difficulty. The patient  is able to ambulate in a tandem fashion. The patient is able to stand in the Romberg position.   MOVEMENT EXAM: Tremor:  There is no notable tremor in the outstretched hands.  She is able to draw Archimedes spirals without difficulty.  She pours water from one glass to another without difficulty.  There is very rare intermittent tremor of the head in the "no" direction.  It is irregular/nonrhythmic.       Assessment/Plan:   1.  Tremor  -Head tremor likely represents cervical dystonia.  She had very little head tremor in the room, but I was able to catch some, and right sternocleidomastoid is hypertrophied.  She and I talked about various treatments.  It is really pretty mild and I would not recommend treatment right now, but if it gets worse in the future then Botox is an option.  We talked about risks/benefits/side effects including the black box warning.  -The patient does report that she has hand tremor, although I saw none of that today.  She states that that is far more bothersome in the head tremor, which she does not even notice.  She does ask me if we can try some medication for that.  We decided to start primidone.  Risks, benefits, side effects and alternative therapies were  discussed.  The opportunity to ask questions was given and they were answered to the best of my ability.  The patient expressed understanding and willingness to follow the outlined treatment protocols.  -I told the patient that Cymbalta could contribute to tremor, although in some patients it does not affect tremor at all.  -I will check her TSH  2.  Hand cramping.    -I really do not know the etiology of this.  She is having some myalgias and I will check her CPK.  She has some hand paresthesias.  This really does not sound like myotonia.  We decided to go ahead and proceed with an EMG to see if there is anything notable on that. 3.  Follow-up in the next few months, sooner should new neurologic issues arise.

## 2013-12-17 ENCOUNTER — Telehealth: Payer: Self-pay | Admitting: Neurology

## 2013-12-17 NOTE — Telephone Encounter (Signed)
Spoke with patient. She states she took a half tablet of Primidone on Friday and she still feels sick to her stomach and lightheaded today. She was vomiting all weekend and she feels itchy everywhere with no skin changes. She did not take anymore than 1 half tablet. She states she is not going to take this medication and is not interested in trying a different medication at this time. I advised benadryl if she does not plan to be driving to help relieve the itching. She will call if needed. Medication added to allergy list.

## 2013-12-17 NOTE — Telephone Encounter (Signed)
Tell her I'm sorry about that (sounds like first dose effect that I told her about but would be odd to last this long).  There are other meds that can help if she changes her mind that are unrelated to this one

## 2013-12-17 NOTE — Telephone Encounter (Signed)
Pt needs to talk someone about  Medication that Dr tat put her on [phone]

## 2014-01-10 ENCOUNTER — Telehealth: Payer: Self-pay | Admitting: Neurology

## 2014-01-10 NOTE — Telephone Encounter (Signed)
Pt resch the EMG from 01-28-14 to 02-21-14

## 2014-01-28 ENCOUNTER — Encounter: Payer: 59 | Admitting: Neurology

## 2014-02-05 ENCOUNTER — Other Ambulatory Visit: Payer: Self-pay | Admitting: Radiology

## 2014-02-05 DIAGNOSIS — C50912 Malignant neoplasm of unspecified site of left female breast: Secondary | ICD-10-CM

## 2014-02-06 ENCOUNTER — Telehealth: Payer: Self-pay | Admitting: *Deleted

## 2014-02-06 DIAGNOSIS — C50412 Malignant neoplasm of upper-outer quadrant of left female breast: Secondary | ICD-10-CM | POA: Insufficient documentation

## 2014-02-06 NOTE — Telephone Encounter (Signed)
Left message for a return phone call to schedule patient for [facility] 02/13/14.

## 2014-02-06 NOTE — Telephone Encounter (Signed)
Received call back from patient.  Confirmed [facility] for 02/13/14 at 8am .  Instructions and contact information given.

## 2014-02-11 ENCOUNTER — Ambulatory Visit
Admission: RE | Admit: 2014-02-11 | Discharge: 2014-02-11 | Disposition: A | Discharge status: Home / Self Care | Payer: 59 | Source: Ambulatory Visit | Attending: Radiology | Admitting: Radiology

## 2014-02-11 DIAGNOSIS — C50912 Malignant neoplasm of unspecified site of left female breast: Secondary | ICD-10-CM

## 2014-02-11 MED ORDER — GADOBENATE DIMEGLUMINE 529 MG/ML IV SOLN
14.0000 mL | Freq: Once | INTRAVENOUS | Status: AC | PRN
  Administered 2014-02-11: 14 mL via INTRAVENOUS

## 2014-02-13 ENCOUNTER — Other Ambulatory Visit (HOSPITAL_BASED_OUTPATIENT_CLINIC_OR_DEPARTMENT_OTHER): Payer: 59

## 2014-02-13 ENCOUNTER — Ambulatory Visit
Admission: RE | Admit: 2014-02-13 | Discharge: 2014-02-13 | Disposition: A | Discharge status: Home / Self Care | Payer: 59 | Source: Ambulatory Visit | Attending: Radiation Oncology | Admitting: Radiation Oncology

## 2014-02-13 ENCOUNTER — Encounter (INDEPENDENT_AMBULATORY_CARE_PROVIDER_SITE_OTHER): Payer: Self-pay

## 2014-02-13 ENCOUNTER — Encounter: Payer: Self-pay | Admitting: Physical Therapy

## 2014-02-13 ENCOUNTER — Encounter: Payer: Self-pay | Admitting: Hematology and Oncology

## 2014-02-13 ENCOUNTER — Ambulatory Visit (HOSPITAL_BASED_OUTPATIENT_CLINIC_OR_DEPARTMENT_OTHER): Payer: 59

## 2014-02-13 ENCOUNTER — Encounter: Payer: Self-pay | Admitting: *Deleted

## 2014-02-13 ENCOUNTER — Ambulatory Visit (HOSPITAL_BASED_OUTPATIENT_CLINIC_OR_DEPARTMENT_OTHER): Payer: 59 | Admitting: Hematology and Oncology

## 2014-02-13 ENCOUNTER — Ambulatory Visit: Payer: 59 | Attending: General Surgery | Admitting: Physical Therapy

## 2014-02-13 VITALS — BP 120/74 | HR 74 | Temp 97.5°F | Resp 18 | Ht 65.0 in | Wt 154.9 lb

## 2014-02-13 DIAGNOSIS — C50412 Malignant neoplasm of upper-outer quadrant of left female breast: Secondary | ICD-10-CM

## 2014-02-13 DIAGNOSIS — M439 Deforming dorsopathy, unspecified: Secondary | ICD-10-CM | POA: Insufficient documentation

## 2014-02-13 DIAGNOSIS — C50912 Malignant neoplasm of unspecified site of left female breast: Secondary | ICD-10-CM | POA: Insufficient documentation

## 2014-02-13 LAB — CBC WITH DIFFERENTIAL/PLATELET
BASO%: 0.6 % (ref 0.0–2.0)
Basophils Absolute: 0 10*3/uL (ref 0.0–0.1)
EOS%: 2 % (ref 0.0–7.0)
Eosinophils Absolute: 0.1 10*3/uL (ref 0.0–0.5)
HCT: 42 % (ref 34.8–46.6)
HGB: 13.9 g/dL (ref 11.6–15.9)
LYMPH%: 35.7 % (ref 14.0–49.7)
MCH: 30.4 pg (ref 25.1–34.0)
MCHC: 33.1 g/dL (ref 31.5–36.0)
MCV: 91.9 fL (ref 79.5–101.0)
MONO#: 0.5 10*3/uL (ref 0.1–0.9)
MONO%: 8.6 % (ref 0.0–14.0)
NEUT#: 2.9 10*3/uL (ref 1.5–6.5)
NEUT%: 53.1 % (ref 38.4–76.8)
Platelets: 254 10*3/uL (ref 145–400)
RBC: 4.57 10*6/uL (ref 3.70–5.45)
RDW: 13.8 % (ref 11.2–14.5)
WBC: 5.4 10*3/uL (ref 3.9–10.3)
lymph#: 1.9 10*3/uL (ref 0.9–3.3)

## 2014-02-13 LAB — COMPREHENSIVE METABOLIC PANEL (CC13)
ALBUMIN: 4.1 g/dL (ref 3.5–5.0)
ALK PHOS: 73 U/L (ref 40–150)
ALT: 13 U/L (ref 0–55)
AST: 15 U/L (ref 5–34)
Anion Gap: 8 mEq/L (ref 3–11)
BILIRUBIN TOTAL: 0.54 mg/dL (ref 0.20–1.20)
BUN: 14.1 mg/dL (ref 7.0–26.0)
CHLORIDE: 107 meq/L (ref 98–109)
CO2: 28 mEq/L (ref 22–29)
Calcium: 9.3 mg/dL (ref 8.4–10.4)
Creatinine: 0.8 mg/dL (ref 0.6–1.1)
EGFR: 78 mL/min/{1.73_m2} — AB (ref >90)
Glucose: 62 mg/dl — ABNORMAL LOW (ref 70–140)
Potassium: 4.4 mEq/L (ref 3.5–5.1)
Sodium: 142 mEq/L (ref 136–145)
TOTAL PROTEIN: 7 g/dL (ref 6.4–8.3)

## 2014-02-13 NOTE — Progress Notes (Signed)
Checked in new patient with no issues. She has my card and I advised of alight. She has packet.

## 2014-02-13 NOTE — Patient Instructions (Signed)

## 2014-02-13 NOTE — Progress Notes (Signed)
[facility] Psychosocial Distress Screening Clinical Social Work  Patient completed distress screening protocol and scored a 3 on the Psychosocial Distress Thermometer which indicates mild distress. Clinical Social Worker met with patient and patients husband in [facility] to assess for distress and other psychosocial needs.  Patient stated she was feeling "better" after meeting with the treatment team and getting information on her treatment plan.  CSW and patient discussed the importance of emotional support during treatment and common feelings/emotional responses to being diagnosed with cancer.  CSW informed patient of the support team and support services at [facility] and patient was agreeable to an [employer] referral.  CSW encouraged patient and/or family to call with questions or concerns.     ONCBCN DISTRESS SCREENING 02/13/2014  Screening Type Initial Screening  Distress experienced in past week (1-10) 3  Emotional problem type Depression  Information Concerns Type Lack of info about diagnosis;Lack of info about treatment  Physical Problem type Sleep/insomnia  Physician notified of physical symptoms Yes  Referral to clinical psychology No  Referral to clinical social work Yes  Referral to support programs Yes  Referral to palliative care No   Johnnye Lana, MSW, LCSW, OSW-C Clinical Social Worker [place] [phone]

## 2014-02-13 NOTE — Addendum Note (Signed)
Encounter addended by: [name], MD on: 02/13/2014  1:35 PM<BR>     Documentation filed: Follow-up Section, LOS Section

## 2014-02-13 NOTE — Progress Notes (Signed)
[facility] NOTE  Patient Care Team: Gara Kroner, MD as PCP - General (Family Medicine) Elsie Stain, MD (Pulmonary Disease) Rolm Bookbinder, MD as Consulting Physician (General Surgery) Rulon Eisenmenger, MD as Consulting Physician (Hematology and Oncology) [name], MD as Consulting Physician (Radiation Oncology) Roselee Culver, RN as Registered Nurse [facility] Caleen Jobs, RN as Registered Nurse  CHIEF COMPLAINTS/PURPOSE OF CONSULTATION:  Newly diagnosed breast cancer  HISTORY OF PRESENTING ILLNESS:  Theresa Huff 64 y.o. female is here because of recent diagnosis of left breast cancer. Patient felt a palpable abnormality in mid December. She had nipple inversion starting in November. This was evaluated with a mammogram that revealed 3 separate masses in the left breast 3.7 cm, 0 point Was, 1 cm. This led to an ultrasound and ultrasound-guided biopsy of all 3 nodules. The first 2 nodules revealed invasive ductal carcinoma: An invasive ductal carcinoma with DCIS. There were ER/PR positive HER-2 negative with a KS 67 of 19%. The third nodule him back as invasive mammary cancer with lobular features less intraductal papilloma E 100%, PR positive, HER-2 negative, Ki-67 26%. Patient was presented to the breast multidisciplinary tumor board and she is here today at the multidisciplinary clinic to discuss the treatment plan.  I reviewed her records extensively and collaborated the history with the patient.  SUMMARY OF ONCOLOGIC HISTORY:   Breast cancer of upper-outer quadrant of left female breast   02/04/2014 Initial Diagnosis left breast: Invasive ductal carcinoma with DCIS ER 100%, PR 100%, HER-2 negative, Ki-67 ranged from 19-26%; third biopsy showed invasive mammary cancer with lobular features and intraductal papilloma, ER/PR positive HER-2 negative   02/11/2014 Breast MRI left breast: 3 masses that are confluent measuring 5.6 x 2.7 x 3 cm extends to  the left aspect of area along the penis to involve skin of the area no lymph nodes detected right breast negative    In terms of breast cancer risk profile:  She menarched at early age of 12 and went to menopause at age 72  She had 2 pregnancy, her first child was born at age 11  She has not received birth control pills.  She was never exposed to fertility medications or hormone replacement therapy.  She has no family history of Breast/GYN/GI cancer  MEDICAL HISTORY:  Past Medical History  Diagnosis Date  . Cough   . Weight gain   . Hot flashes   . Insomnia, unspecified   . Depressive disorder, not elsewhere classified   . Allergic rhinitis due to pollen   . Plantar fasciitis   . OSA (obstructive sleep apnea) 09/09/2010    Pt denies on 12/14/2013  . Restless leg syndrome 09/09/2010  . Breast cancer   . Skin cancer     SURGICAL HISTORY: Past Surgical History  Procedure Laterality Date  . Tonsillectomy    . Melanoma excision      right leg, left arm    SOCIAL HISTORY: History   Social History  . Marital Status: Married    Spouse Name: N/A    Number of Children: 2  . Years of Education: N/A   Occupational History  . Scientist, water quality    Social History Main Topics  . Smoking status: Former Smoker -- 0.20 packs/day for 6 years    Types: Cigarettes    Quit date: 01/25/1974  . Smokeless tobacco: Never Used  . Alcohol Use: Yes     Comment: 5 glasses per month  .  Drug Use: No  . Sexual Activity: Not on file   Other Topics Concern  . Not on file   Social History Narrative    FAMILY HISTORY: Family History  Problem Relation Age of Onset  . Alcohol abuse Father   . Emphysema Mother   . Heart disease Mother   . Heart failure Mother   . Asthma Daughter   . Asthma Other     grandson  . Asthma Sister     ALLERGIES:  is allergic to codeine; iodine; primidone; and sulfa antibiotics.  MEDICATIONS:  Current Outpatient Prescriptions  Medication Sig Dispense  Refill  . cetirizine (ZYRTEC) 10 MG tablet Take 10 mg by mouth daily.      . Cholecalciferol (VITAMIN D PO) Take 2,000 Units by mouth daily.    . DULoxetine (CYMBALTA) 30 MG capsule Take 30 mg by mouth daily.  5  . DULoxetine (CYMBALTA) 60 MG capsule Take 30 mg by mouth daily.     Marland Kitchen esomeprazole (NEXIUM) 20 MG packet Take 20 mg by mouth daily before breakfast.    . Eszopiclone (ESZOPICLONE) 3 MG TABS Take 3 mg by mouth at bedtime. Take immediately before bedtime    . Vitamin D, Ergocalciferol, (DRISDOL) 50000 UNITS CAPS capsule Take 50,000 Units by mouth every 7 (seven) days.    Marland Kitchen CALCIUM PO Take by mouth daily.    Marland Kitchen estrogen, conjugated,-medroxyprogesterone (PREMPRO) 0.3-1.5 MG per tablet Take 1 tablet by mouth daily.    . mometasone (NASONEX) 50 MCG/ACT nasal spray Place 2 sprays into the nose daily.      Marland Kitchen omeprazole (PRILOSEC) 20 MG capsule Take 1 capsule (20 mg total) by mouth daily. 30 capsule 6   No current facility-administered medications for this visit.    REVIEW OF SYSTEMS:   Constitutional: Denies fevers, chills or abnormal night sweats Eyes: Denies blurriness of vision, double vision or watery eyes Ears, nose, mouth, throat, and face: Denies mucositis or sore throat Respiratory: Denies cough, dyspnea or wheezes Cardiovascular: Denies palpitation, chest discomfort or lower extremity swelling Gastrointestinal:  Denies nausea, heartburn or change in bowel habits Skin: Denies abnormal skin rashes Lymphatics: Denies new lymphadenopathy or easy bruising Neurological:Denies numbness, tingling or new weaknesses Behavioral/Psych: Mood is stable, no new changes  Breast: large palpable lump in the right breast with nipple inversion All other systems were reviewed with the patient and are negative.  PHYSICAL EXAMINATION: ECOG PERFORMANCE STATUS: 0 - Asymptomatic  Filed Vitals:   02/13/14 0903  BP: 120/74  Pulse: 74  Temp: 97.5 F (36.4 C)  Resp: 18   Filed Weights    02/13/14 0903  Weight: 154 lb 14.4 oz (70.262 kg)    GENERAL:alert, no distress and comfortable SKIN: skin color, texture, turgor are normal, no rashes or significant lesions EYES: normal, conjunctiva are pink and non-injected, sclera clear OROPHARYNX:no exudate, no erythema and lips, buccal mucosa, and tongue normal  NECK: supple, thyroid normal size, non-tender, without nodularity LYMPH:  no palpable lymphadenopathy in the cervical, axillary or inguinal LUNGS: clear to auscultation and percussion with normal breathing effort HEART: regular rate & rhythm and no murmurs and no lower extremity edema ABDOMEN:abdomen soft, non-tender and normal bowel sounds Musculoskeletal:no cyanosis of digits and no clubbing  PSYCH: alert & oriented x 3 with fluent speech NEURO: no focal motor/sensory deficits BREAST:large palpable breast in the right breast 5 cm in size with bruising. No palpable axillary or supraclavicular lymphadenopathy (exam performed in the presence of a chaperone)   LABORATORY  DATA:  I have reviewed the data as listed Lab Results  Component Value Date   WBC 5.4 02/13/2014   HGB 13.9 02/13/2014   HCT 42.0 02/13/2014   MCV 91.9 02/13/2014   PLT 254 02/13/2014   Lab Results  Component Value Date   NA 142 02/13/2014   K 4.4 02/13/2014   CL 102 09/14/2010   CO2 28 02/13/2014    RADIOGRAPHIC STUDIES: I have personally reviewed the radiological reports and agreed with the findings in the report. Results summarized as above  ASSESSMENT AND PLAN:  Breast cancer of upper-outer quadrant of left female breast Left breast multifocal invasive ductal carcinoma with DCIS, 3 nodules measuring 3.7 cm, 0.97 as, 1 cm all 3 are ER/PR positive HER-2 negative cast some 19% to 26%; third nodule invasive mammary cancer with intraductal papilloma, MRI showing 5.6 and another area of enhancement, clinical stage TIII N0 M0 stage IIB, Grade 1  Pathology radiology counseling:Discussed with the  patient, the details of pathology including the type of breast cancer,the clinical staging, the significance of ER, PR and HER-2/neu receptors and the implications for treatment. After reviewing the pathology in detail, we proceeded to discuss the different treatment options between surgery, radiation, chemotherapy, antiestrogen therapies.  Recommendation: 1. Breast conserving surgery 2. Oncotype DX testing to determine benefit from chemotherapy 3. Adjuvant radiation therapy followed by 4. Antiestrogen therapy with anastrozole for 5 years  Oncotype DX counseling:I discussed Oncotype DX test. I explained to the patient that this is a 21 gene panel to evaluate patient tumors DNA to calculate recurrence score. This would help determine whether patient has high risk or intermediate risk or low risk breast cancer. She understands that if her tumor was found to be high risk, she would benefit from systemic chemotherapy. If low risk, no need of chemotherapy. If she was found to be intermediate risk, we would need to evaluate the score as well as other risk factors and determine if an abbreviated chemotherapy may be of benefit.  Plan: Return to clinic after surgery to discuss final pathology and to determine if Oncotype DX needs to be sent   All questions were answered. The patient knows to call the clinic with any problems, questions or concerns.    Rulon Eisenmenger, MD 11:56 AM

## 2014-02-13 NOTE — Assessment & Plan Note (Signed)
Left breast multifocal invasive ductal carcinoma with DCIS, 3 nodules measuring 3.7 cm, 0.97 as, 1 cm all 3 are ER/PR positive HER-2 negative cast some 19% to 26%; third nodule invasive mammary cancer with intraductal papilloma, MRI showing 5.6 and another area of enhancement, clinical stage TIII N0 M0 stage IIB, Grade 1  Pathology radiology counseling:Discussed with the patient, the details of pathology including the type of breast cancer,the clinical staging, the significance of ER, PR and HER-2/neu receptors and the implications for treatment. After reviewing the pathology in detail, we proceeded to discuss the different treatment options between surgery, radiation, chemotherapy, antiestrogen therapies.  Recommendation: 1. Breast conserving surgery 2. Oncotype DX testing to determine benefit from chemotherapy 3. Adjuvant radiation therapy followed by 4. Antiestrogen therapy with anastrozole for 5 years  Oncotype DX counseling:I discussed Oncotype DX test. I explained to the patient that this is a 21 gene panel to evaluate patient tumors DNA to calculate recurrence score. This would help determine whether patient has high risk or intermediate risk or low risk breast cancer. She understands that if her tumor was found to be high risk, she would benefit from systemic chemotherapy. If low risk, no need of chemotherapy. If she was found to be intermediate risk, we would need to evaluate the score as well as other risk factors and determine if an abbreviated chemotherapy may be of benefit.  Plan: Return to clinic after surgery to discuss final pathology and to determine if Oncotype DX needs to be sent  

## 2014-02-13 NOTE — Therapy (Signed)
[place], [place], [address] Phone: [phone]   Fax:  [phone]  Physical Therapy Evaluation  Patient Details  Name: Theresa Huff MRN: [id number] Date of Birth: 10/10/1950 Referring Provider:  Rolm Bookbinder, MD  Encounter Date: 02/13/2014      PT End of Session - 02/13/14 1036    Visit Number 1   Number of Visits 1   PT Start Time 1003   PT Stop Time 1027   PT Time Calculation (min) 24 min   Activity Tolerance Patient tolerated treatment well   Behavior During Therapy [facility] for tasks assessed/performed      Past Medical History  Diagnosis Date  . Cough   . Weight gain   . Hot flashes   . Insomnia, unspecified   . Depressive disorder, not elsewhere classified   . Allergic rhinitis due to pollen   . Plantar fasciitis   . OSA (obstructive sleep apnea) 09/09/2010    Pt denies on 12/14/2013  . Restless leg syndrome 09/09/2010  . Breast cancer   . Skin cancer     Past Surgical History  Procedure Laterality Date  . Tonsillectomy    . Melanoma excision      right leg, left arm    There were no vitals taken for this visit.  Visit Diagnosis:  Breast cancer, left  Postural deformity      Subjective Assessment - 02/13/14 1028    Symptoms Patient is being seen today at the breast multidisciplinary clinic for her new diagnosis of left breast cancer.   Pertinent History Diagnosed 01/30/14 with left ER/PR positive, HER2 negative grade I invasive ductal carcinoma breast cancer.  She is being seen today for a baseline assessment.   Patient Stated Goals Reduce lymphedema risk and learn post op shoulder ROM exercises.   Currently in Pain? No/denies          [facility] PT Assessment - 02/13/14 0001    Assessment   Medical Diagnosis Left breast cancer   Onset Date 01/30/14   Precautions   Precautions Other (comment)  Active left breast cancer   Restrictions   Weight Bearing Restrictions  No   Balance Screen   Has the patient fallen in the past 6 months No   Has the patient had a decrease in activity level because of a fear of falling?  No   Is the patient reluctant to leave their home because of a fear of falling?  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   Leisure Does cardio 3x/wk; plays alot of racquetball   Cognition   Overall Cognitive Status Within Functional Limits for tasks assessed   Posture/Postural Control   Posture/Postural Control Postural limitations   Postural Limitations Rounded Shoulders;Forward head   AROM   Right Shoulder Extension 64 Degrees   Right Shoulder Flexion 156 Degrees   Right Shoulder ABduction 168 Degrees   Right Shoulder Internal Rotation 82 Degrees   Right Shoulder External Rotation 81 Degrees   Left Shoulder Extension 68 Degrees   Left Shoulder Flexion 151 Degrees   Left Shoulder ABduction 169 Degrees   Left Shoulder Internal Rotation 75 Degrees   Left Shoulder External Rotation 75 Degrees  Strength   Overall Strength Within functional limits for tasks performed           LYMPHEDEMA/ONCOLOGY QUESTIONNAIRE - 02/13/14 1033    Type   Cancer Type Left breast   Lymphedema Assessments   Lymphedema Assessments Upper extremities   Right Upper Extremity Lymphedema   10 cm Proximal to Olecranon Process 28 cm   Olecranon Process 24.3 cm   10 cm Proximal to Ulnar Styloid Process 19.5 cm   Just Proximal to Ulnar Styloid Process 14.7 cm   Across Hand at [employer] 17.1 cm   At [place] of 2nd Digit 5.7 cm   Left Upper Extremity Lymphedema   10 cm Proximal to Olecranon Process 29.4 cm   Olecranon Process 24.8 cm   10 cm Proximal to Ulnar Styloid Process 20.4  cm   Just Proximal to Ulnar Styloid Process 15.2 cm   Across Hand at [employer] 18.1 cm   At [place] of 2nd Digit 5.8 cm           PT Education - 02/13/14 1036    Education provided Yes   Education Details Post op shoulder ROM HEP; lymphedema risk reduction practices   Person(s) Educated Patient;Spouse   Methods Explanation;Demonstration;Handout   Comprehension Verbalized understanding              Breast Clinic Goals - 02/13/14 1043    Patient will be able to verbalize understanding of pertinent lymphedema risk reduction practices relevant to her diagnosis specifically related to skin care.   Time 1   Period Days   Status Achieved   Patient will be able to return demonstrate and/or verbalize understanding of the post-op home exercise program related to regaining shoulder range of motion.   Time 1   Period Days   Status Achieved   Patient will be able to verbalize understanding of the importance of attending the postoperative After Breast Cancer Class for further lymphedema risk reduction education and therapeutic exercise.   Time 1   Period Days   Status Achieved              Plan - 02/13/14 1037    Clinical Impression Statement Patient was esen today for baseline PT assessment to reduce and monitor for lymphedema risk and obtain arm circumferential and shoulder ROM measurements.  She is planning to have a left mastectomy and sentinel node biopsy with reconstruction followed by radiation and anti-estrogen therapy.   Pt will benefit from skilled therapeutic intervention in order to improve on the following deficits Decreased range of motion;Increased edema;Decreased knowledge of precautions;Pain;Impaired UE functional use;Decreased strength   Rehab Potential Excellent   Clinical Impairments Affecting Rehab Potential none   PT Frequency One time visit   PT Treatment/Interventions Patient/family education;Therapeutic exercise   PT Next Visit Plan Will follow up  postoperatively if needed.   Consulted and Agree with Plan of Care Patient;Family member/caregiver   Family Member Consulted Husband         Problem List Patient Active Problem List   Diagnosis Date Noted  . Breast cancer of upper-outer quadrant of left female breast 02/06/2014  . OSA (obstructive sleep apnea) 09/09/2010  . Restless leg syndrome 09/09/2010  . Depressive disorder, not elsewhere classified   . Asthma, cough variant   . Insomnia, unspecified   . Allergic rhinitis due to pollen     [facility], PT 02/13/2014, 10:47 AM  [place], [place], [address]  Phone: [phone]   Fax:  [phone]

## 2014-02-13 NOTE — Progress Notes (Signed)
Radiation Oncology         [phone]) [phone] ________________________________  Initial outpatient Consultation - Date: 02/13/2014   Name: Theresa Huff MRN: [id number]   DOB: Oct 18, 1950  REFERRING PHYSICIAN: Rolm Bookbinder, MD  DIAGNOSIS:    ICD-9-CM ICD-10-CM   1. Breast cancer of upper-outer quadrant of left female breast 174.4 C50.412     STAGE: T3N0 Invasive Ductal Carcinoma of the left breast (multifocal)  HISTORY OF PRESENT ILLNESS::Theresa Huff is a 64 y.o. female  Who palpated a left breast mass and noted nipple inversion. This was not painful.  She was found on mammogram to have a mass in the upper outer quadrant. Ultrasound confirmed 3 masses measuring 0.9, 3.7, and 1.0 cm These were biopsied separately and were found to be mostly invasive ductal cancer, strongly ER and PR positive HER2- with low KI67.  The lesion closes to the nipple had lobular features. MRI showed a 5.6 cm mass with possible involvement of the areola. She is sore after her biopsy and has some bruising. She has stopped her HRT which she was weaning off of and taking every other day. She is GxP2 with her first birth at 55. Menarche at 50 with menopause in 2003. She is accompanied by a friend and her husband.   PREVIOUS RADIATION THERAPY: No  FAMILY HISTORY:  Family History  Problem Relation Age of Onset  . Alcohol abuse Father   . Emphysema Mother   . Heart disease Mother   . Heart failure Mother   . Asthma Daughter   . Asthma Other     grandson  . Asthma Sister     SOCIAL HISTORY:  History  Substance Use Topics  . Smoking status: Former Smoker -- 0.20 packs/day for 6 years    Types: Cigarettes    Quit date: 01/25/1974  . Smokeless tobacco: Never Used  . Alcohol Use: Yes     Comment: 5 glasses per month    REVIEW OF SYSTEMS:  A 15 point review of systems is documented in the electronic medical record. This was obtained by the nursing staff. However, I reviewed this with the patient to  discuss relevant findings and make appropriate changes.  Pertinent positives are included in the chart.   PHYSICAL EXAM:  Pleasant female. No distress. Palapble mass from the left nipple out to the upper outer quadrant with associated bruising. No palpable lymphadenopathy bilaterally. No palpable abnormalities of the left breast. Appropriate mood affect and judgement.   IMPRESSION: T3N0 Invasive ductal carcinoma of the left breast  PLAN:I spoke to the patient today regarding her diagnosis and options for treatment. We discussed the equivalence in terms of survival and local failure between mastectomy and breast conservation. We discussed the role of radiation in decreasing local failures in patients who undergo mastectomy and have risk factors for recurrence including positive lymph nodes and/or tumors over 5 cm and/or positive margins. We discussed the process of simulation and the placement tattoos. We discussed 6 weeks of treatment as an outpatient. We discussed the possibility of asymptomatic lung damage. We discussed the low likelihood of secondary malignancies. We discussed the possible side effects including but not limited to skin redness, fatigue, permanent skin darkening, and chest wall swelling. We discussed increased complications that can occur with reconstruction after radiation.    She will undergo surgery and follow up with me following this. I would consider treatment of the chest wall alone if her lymph nodes are negative and no LVI was noted.  She will be referred to plastic surgery for consultation. We discussed the pros and cons of immediate versus delayed reconstruction.   She will also have oncotype ordered to determine her need for chemotherapy vs. Antiestrogen treatment alone.   I spent 60 minutes  face to face with the patient and more than 50% of that time was spent in counseling and/or coordination of care.   ------------------------------------------------  Theresa Silversmith,  MD

## 2014-02-13 NOTE — Progress Notes (Signed)
Ms. Villatoro is a very pleasant 64 y.o. female from [place], [place] with newly diagnosed grade 1 invasive ductal carcinoma and foci of DCIS of the left breast.  Biopsy results revealed the tumor's hormone status as ER positive, PR positive, and HER2/neu negative. Ki67 is 19%.  She presents today with her husband and friend to the [facility]) for treatment consideration and recommendations from the breast surgeon, radiation oncologist, and medical oncologist.     I briefly met with Ms. Hardacre and her family during her [facility] visit today. We discussed the purpose of the Survivorship Clinic, which will include monitoring for recurrence, coordinating completion of age and gender-appropriate cancer screenings, promotion of overall wellness, as well as managing potential late/long-term side effects of anti-cancer treatments.    As of today, the treatment plan for Ms. Matlin will likely include surgery and anti-estrogen therapy.  The intent of treatment for Ms. Moeser is cure, therefore she will be eligible for the Survivorship Clinic upon her completion of treatment.  Her survivorship care plan (SCP) document will be drafted and updated throughout the course of her treatment trajectory. She will receive the SCP in an office visit with myself in the Survivorship Clinic once she has completed treatment.   Ms. Etzkorn was encouraged to ask questions and all questions were answered to her satisfaction.  She was given my business card and encouraged to contact me with any concerns regarding survivorship.  I look forward to participating in her care.   Mike Craze, NP [place] [phone]

## 2014-02-13 NOTE — Progress Notes (Signed)
MD note created during office visit - copy to patient original to scan.

## 2014-02-14 ENCOUNTER — Telehealth: Payer: Self-pay | Admitting: Neurology

## 2014-02-14 NOTE — Telephone Encounter (Signed)
Pt called and canceled EMG on 02-21-14 with Dr Posey Pronto and her appt with Dr Tat on 03-26-14. She was just dx with breast cancer and is working on getting those appts first. Will call bck to resch when she can

## 2014-02-18 ENCOUNTER — Telehealth: Payer: Self-pay | Admitting: *Deleted

## 2014-02-18 NOTE — Telephone Encounter (Signed)
Left vm for pt to return call regarding [place] from 02/13/14.

## 2014-02-18 NOTE — Telephone Encounter (Signed)
Spoke to pt concerning [place] from 02/13/14. Pt denies questions or concerns regarding dx or treatment care plan. Pt saw Dr. Harlow Mares today for plastic consult. Pt has decided not to have reconstruction at this time. Informed pt that I would inform care team. Encourage pt to call with needs. Received verbal understanding. Contact information given.

## 2014-02-19 ENCOUNTER — Other Ambulatory Visit (INDEPENDENT_AMBULATORY_CARE_PROVIDER_SITE_OTHER): Payer: Self-pay | Admitting: General Surgery

## 2014-02-19 DIAGNOSIS — C50412 Malignant neoplasm of upper-outer quadrant of left female breast: Secondary | ICD-10-CM

## 2014-02-21 ENCOUNTER — Encounter: Payer: 59 | Admitting: Neurology

## 2014-02-22 ENCOUNTER — Telehealth: Payer: Self-pay | Admitting: *Deleted

## 2014-02-22 NOTE — Telephone Encounter (Signed)
Spoke with patient and confirmed follow up with Dr. Lindi Adie for 03/22/14 at 845am.  Encouraged patient to call with any needs or concerns.

## 2014-03-01 ENCOUNTER — Encounter (HOSPITAL_BASED_OUTPATIENT_CLINIC_OR_DEPARTMENT_OTHER): Payer: Self-pay | Admitting: *Deleted

## 2014-03-01 NOTE — Progress Notes (Signed)
Labs done 02/13/14-were in good range-no other labs needed To bring all meds and overnight bag

## 2014-03-07 ENCOUNTER — Encounter (HOSPITAL_BASED_OUTPATIENT_CLINIC_OR_DEPARTMENT_OTHER)
Admission: RE | Disposition: A | Discharge status: Home / Self Care | Payer: Self-pay | Source: Ambulatory Visit | Attending: General Surgery

## 2014-03-07 ENCOUNTER — Encounter (HOSPITAL_BASED_OUTPATIENT_CLINIC_OR_DEPARTMENT_OTHER): Payer: Self-pay | Admitting: Anesthesiology

## 2014-03-07 ENCOUNTER — Ambulatory Visit (HOSPITAL_BASED_OUTPATIENT_CLINIC_OR_DEPARTMENT_OTHER)
Admission: RE | Admit: 2014-03-07 | Discharge: 2014-03-08 | Disposition: A | Discharge status: Home / Self Care | Payer: 59 | Source: Ambulatory Visit | Attending: General Surgery | Admitting: General Surgery

## 2014-03-07 ENCOUNTER — Encounter (HOSPITAL_COMMUNITY)
Admission: RE | Admit: 2014-03-07 | Discharge: 2014-03-07 | Disposition: A | Discharge status: Home / Self Care | Payer: 59 | Source: Ambulatory Visit | Attending: General Surgery | Admitting: General Surgery

## 2014-03-07 ENCOUNTER — Ambulatory Visit (HOSPITAL_BASED_OUTPATIENT_CLINIC_OR_DEPARTMENT_OTHER): Payer: 59 | Admitting: Anesthesiology

## 2014-03-07 DIAGNOSIS — F329 Major depressive disorder, single episode, unspecified: Secondary | ICD-10-CM | POA: Diagnosis not present

## 2014-03-07 DIAGNOSIS — C50412 Malignant neoplasm of upper-outer quadrant of left female breast: Secondary | ICD-10-CM | POA: Insufficient documentation

## 2014-03-07 DIAGNOSIS — Z170 Estrogen receptor positive status [ER+]: Secondary | ICD-10-CM | POA: Insufficient documentation

## 2014-03-07 DIAGNOSIS — Z85828 Personal history of other malignant neoplasm of skin: Secondary | ICD-10-CM | POA: Diagnosis not present

## 2014-03-07 DIAGNOSIS — G4730 Sleep apnea, unspecified: Secondary | ICD-10-CM | POA: Diagnosis not present

## 2014-03-07 DIAGNOSIS — C50912 Malignant neoplasm of unspecified site of left female breast: Secondary | ICD-10-CM | POA: Insufficient documentation

## 2014-03-07 DIAGNOSIS — K219 Gastro-esophageal reflux disease without esophagitis: Secondary | ICD-10-CM | POA: Diagnosis not present

## 2014-03-07 DIAGNOSIS — Z87891 Personal history of nicotine dependence: Secondary | ICD-10-CM | POA: Diagnosis not present

## 2014-03-07 DIAGNOSIS — C50919 Malignant neoplasm of unspecified site of unspecified female breast: Secondary | ICD-10-CM | POA: Diagnosis present

## 2014-03-07 DIAGNOSIS — C773 Secondary and unspecified malignant neoplasm of axilla and upper limb lymph nodes: Secondary | ICD-10-CM | POA: Diagnosis not present

## 2014-03-07 DIAGNOSIS — N6012 Diffuse cystic mastopathy of left breast: Secondary | ICD-10-CM | POA: Insufficient documentation

## 2014-03-07 DIAGNOSIS — Z9010 Acquired absence of unspecified breast and nipple: Secondary | ICD-10-CM

## 2014-03-07 HISTORY — PX: MASTECTOMY W/ SENTINEL NODE BIOPSY: SHX2001

## 2014-03-07 HISTORY — DX: Gastro-esophageal reflux disease without esophagitis: K21.9

## 2014-03-07 HISTORY — DX: Presence of spectacles and contact lenses: Z97.3

## 2014-03-07 SURGERY — MASTECTOMY WITH SENTINEL LYMPH NODE BIOPSY
Anesthesia: General | Site: Breast | Laterality: Left

## 2014-03-07 MED ORDER — ESOMEPRAZOLE MAGNESIUM 20 MG PO PACK: 20.0000 mg | PACK | Freq: Every day | ORAL | Status: DC

## 2014-03-07 MED ORDER — ZOLPIDEM TARTRATE 5 MG PO TABS: 5.0000 mg | ORAL_TABLET | Freq: Every evening | ORAL | Status: DC | PRN

## 2014-03-07 MED ORDER — SODIUM CHLORIDE 0.9 % IJ SOLN
INTRAMUSCULAR | Status: DC | PRN
  Administered 2014-03-07: 3 mL via INTRAVENOUS

## 2014-03-07 MED ORDER — TECHNETIUM TC 99M SULFUR COLLOID FILTERED
1.0000 | Freq: Once | INTRAVENOUS | Status: AC | PRN
  Administered 2014-03-07: 1 via INTRADERMAL

## 2014-03-07 MED ORDER — OXYCODONE HCL 5 MG/5ML PO SOLN: 5.0000 mg | Freq: Once | ORAL | Status: AC | PRN

## 2014-03-07 MED ORDER — DULOXETINE HCL 30 MG PO CPEP: 30.0000 mg | ORAL_CAPSULE | Freq: Every day | ORAL | Status: DC

## 2014-03-07 MED ORDER — DEXAMETHASONE SODIUM PHOSPHATE 4 MG/ML IJ SOLN
INTRAMUSCULAR | Status: DC | PRN
  Administered 2014-03-07: 10 mg via INTRAVENOUS

## 2014-03-07 MED ORDER — MORPHINE SULFATE 2 MG/ML IJ SOLN: 2.0000 mg | INTRAMUSCULAR | Status: DC | PRN

## 2014-03-07 MED ORDER — MIDAZOLAM HCL 2 MG/2ML IJ SOLN
INTRAMUSCULAR | Status: AC
  Filled 2014-03-07: qty 2

## 2014-03-07 MED ORDER — FENTANYL CITRATE 0.05 MG/ML IJ SOLN
INTRAMUSCULAR | Status: AC
  Filled 2014-03-07: qty 6

## 2014-03-07 MED ORDER — FENTANYL CITRATE 0.05 MG/ML IJ SOLN
INTRAMUSCULAR | Status: DC | PRN
  Administered 2014-03-07 (×2): 50 ug via INTRAVENOUS

## 2014-03-07 MED ORDER — FENTANYL CITRATE 0.05 MG/ML IJ SOLN
INTRAMUSCULAR | Status: AC
  Filled 2014-03-07: qty 2

## 2014-03-07 MED ORDER — MIDAZOLAM HCL 2 MG/2ML IJ SOLN
1.0000 mg | INTRAMUSCULAR | Status: DC | PRN
  Administered 2014-03-07 (×2): 2 mg via INTRAVENOUS

## 2014-03-07 MED ORDER — LIDOCAINE HCL (CARDIAC) 20 MG/ML IV SOLN
INTRAVENOUS | Status: DC | PRN
  Administered 2014-03-07: 60 mg via INTRAVENOUS

## 2014-03-07 MED ORDER — FENTANYL CITRATE 0.05 MG/ML IJ SOLN
50.0000 ug | INTRAMUSCULAR | Status: DC | PRN
  Administered 2014-03-07 (×2): 100 ug via INTRAVENOUS

## 2014-03-07 MED ORDER — HYDROMORPHONE HCL 1 MG/ML IJ SOLN
INTRAMUSCULAR | Status: AC
  Filled 2014-03-07: qty 1

## 2014-03-07 MED ORDER — OXYCODONE HCL 5 MG PO TABS
5.0000 mg | ORAL_TABLET | ORAL | Status: DC | PRN
Start: 2014-03-07 — End: 2014-03-08
  Administered 2014-03-07 – 2014-03-08 (×4): 5 mg via ORAL
  Filled 2014-03-07 (×4): qty 1

## 2014-03-07 MED ORDER — CEFAZOLIN SODIUM-DEXTROSE 2-3 GM-% IV SOLR
2.0000 g | INTRAVENOUS | Status: AC
  Administered 2014-03-07: 2 g via INTRAVENOUS

## 2014-03-07 MED ORDER — PROPOFOL 10 MG/ML IV BOLUS
INTRAVENOUS | Status: DC | PRN
  Administered 2014-03-07: 200 mg via INTRAVENOUS

## 2014-03-07 MED ORDER — ACETAMINOPHEN 650 MG RE SUPP: 650.0000 mg | Freq: Four times a day (QID) | RECTAL | Status: DC | PRN

## 2014-03-07 MED ORDER — ACETAMINOPHEN 325 MG PO TABS: 650.0000 mg | ORAL_TABLET | Freq: Four times a day (QID) | ORAL | Status: DC | PRN

## 2014-03-07 MED ORDER — ONDANSETRON HCL 4 MG/2ML IJ SOLN
INTRAMUSCULAR | Status: DC | PRN
  Administered 2014-03-07: 4 mg via INTRAVENOUS

## 2014-03-07 MED ORDER — SODIUM CHLORIDE 0.9 % IV SOLN
INTRAVENOUS | Status: DC
  Administered 2014-03-07: 16:00:00 via INTRAVENOUS

## 2014-03-07 MED ORDER — METHYLENE BLUE 1 % INJ SOLN
INTRAMUSCULAR | Status: DC | PRN
  Administered 2014-03-07: 2 mL via SUBMUCOSAL

## 2014-03-07 MED ORDER — ONDANSETRON HCL 4 MG/2ML IJ SOLN: 4.0000 mg | Freq: Once | INTRAMUSCULAR | Status: AC | PRN

## 2014-03-07 MED ORDER — OXYCODONE HCL 5 MG PO TABS: 5.0000 mg | ORAL_TABLET | Freq: Once | ORAL | Status: AC | PRN

## 2014-03-07 MED ORDER — LACTATED RINGERS IV SOLN
INTRAVENOUS | Status: DC
  Administered 2014-03-07 (×3): via INTRAVENOUS

## 2014-03-07 MED ORDER — CEFAZOLIN SODIUM-DEXTROSE 2-3 GM-% IV SOLR
INTRAVENOUS | Status: AC
  Filled 2014-03-07: qty 50

## 2014-03-07 MED ORDER — ONDANSETRON HCL 4 MG/2ML IJ SOLN: 4.0000 mg | Freq: Four times a day (QID) | INTRAMUSCULAR | Status: DC | PRN

## 2014-03-07 MED ORDER — HYDROMORPHONE HCL 1 MG/ML IJ SOLN
0.2500 mg | INTRAMUSCULAR | Status: DC | PRN
  Administered 2014-03-07 (×2): 0.5 mg via INTRAVENOUS

## 2014-03-07 SURGICAL SUPPLY — 70 items
ADH SKN CLS APL DERMABOND .7 (GAUZE/BANDAGES/DRESSINGS) ×2
APL SKNCLS STERI-STRIP NONHPOA (GAUZE/BANDAGES/DRESSINGS) ×1
APPLIER CLIP 11 MED OPEN (CLIP)
APPLIER CLIP 9.375 MED OPEN (MISCELLANEOUS) ×2
APR CLP MED 11 20 MLT OPN (CLIP)
APR CLP MED 9.3 20 MLT OPN (MISCELLANEOUS) ×1
BENZOIN TINCTURE PRP APPL 2/3 (GAUZE/BANDAGES/DRESSINGS) ×2 IMPLANT
BIOPATCH RED 1 DISK 7.0 (GAUZE/BANDAGES/DRESSINGS) ×1 IMPLANT
BLADE CLIPPER SURG (BLADE) IMPLANT
BLADE HEX COATED 2.75 (ELECTRODE) ×2 IMPLANT
BLADE SURG 10 STRL SS (BLADE) ×2 IMPLANT
BLADE SURG 15 STRL LF DISP TIS (BLADE) ×1 IMPLANT
BLADE SURG 15 STRL SS (BLADE) ×2
CANISTER SUCT 1200ML W/VALVE (MISCELLANEOUS) ×2 IMPLANT
CHLORAPREP W/TINT 26ML (MISCELLANEOUS) ×2 IMPLANT
CLIP APPLIE 11 MED OPEN (CLIP) IMPLANT
CLIP APPLIE 9.375 MED OPEN (MISCELLANEOUS) IMPLANT
COVER BACK TABLE 60X90IN (DRAPES) ×2 IMPLANT
COVER MAYO STAND STRL (DRAPES) ×2 IMPLANT
COVER PROBE W GEL 5X96 (DRAPES) ×2 IMPLANT
DECANTER SPIKE VIAL GLASS SM (MISCELLANEOUS) ×2 IMPLANT
DERMABOND ADVANCED (GAUZE/BANDAGES/DRESSINGS) ×2
DERMABOND ADVANCED .7 DNX12 (GAUZE/BANDAGES/DRESSINGS) IMPLANT
DEVICE DISSECT PLASMABLAD 3.0S (MISCELLANEOUS) ×1 IMPLANT
DRAIN CHANNEL 19F RND (DRAIN) ×2 IMPLANT
DRAPE LAPAROSCOPIC ABDOMINAL (DRAPES) ×2 IMPLANT
DRAPE UTILITY XL STRL (DRAPES) ×2 IMPLANT
DRSG TEGADERM 2-3/8X2-3/4 SM (GAUZE/BANDAGES/DRESSINGS) ×1 IMPLANT
ELECT REM PT RETURN 9FT ADLT (ELECTROSURGICAL) ×2
ELECTRODE REM PT RTRN 9FT ADLT (ELECTROSURGICAL) ×1 IMPLANT
EVACUATOR SILICONE 100CC (DRAIN) ×2 IMPLANT
GLOVE BIO SURGEON STRL SZ7 (GLOVE) ×2 IMPLANT
GLOVE BIOGEL M STRL SZ7.5 (GLOVE) ×1 IMPLANT
GLOVE BIOGEL PI IND STRL 7.5 (GLOVE) ×1 IMPLANT
GLOVE BIOGEL PI IND STRL 8 (GLOVE) IMPLANT
GLOVE BIOGEL PI INDICATOR 7.5 (GLOVE) ×1
GLOVE BIOGEL PI INDICATOR 8 (GLOVE) ×1
GOWN STRL REUS W/ TWL LRG LVL3 (GOWN DISPOSABLE) ×3 IMPLANT
GOWN STRL REUS W/TWL LRG LVL3 (GOWN DISPOSABLE) ×6
MARKER SKIN DUAL TIP RULER LAB (MISCELLANEOUS) ×2 IMPLANT
NDL HYPO 25X1 1.5 SAFETY (NEEDLE) ×2 IMPLANT
NDL SAFETY ECLIPSE 18X1.5 (NEEDLE) ×1 IMPLANT
NEEDLE HYPO 18GX1.5 SHARP (NEEDLE) ×2
NEEDLE HYPO 25X1 1.5 SAFETY (NEEDLE) ×4 IMPLANT
NS IRRIG 1000ML POUR BTL (IV SOLUTION) ×2 IMPLANT
PACK BASIN DAY SURGERY FS (CUSTOM PROCEDURE TRAY) ×2 IMPLANT
PENCIL BUTTON HOLSTER BLD 10FT (ELECTRODE) ×2 IMPLANT
PIN SAFETY STERILE (MISCELLANEOUS) ×2 IMPLANT
PLASMABLADE 3.0S (MISCELLANEOUS) ×2
SHEET MEDIUM DRAPE 40X70 STRL (DRAPES) ×2 IMPLANT
SLEEVE SCD COMPRESS KNEE MED (MISCELLANEOUS) ×2 IMPLANT
SPONGE GAUZE 4X4 12PLY STER LF (GAUZE/BANDAGES/DRESSINGS) IMPLANT
SPONGE LAP 18X18 X RAY DECT (DISPOSABLE) ×2 IMPLANT
SPONGE LAP 4X18 X RAY DECT (DISPOSABLE) IMPLANT
STAPLER VISISTAT 35W (STAPLE) ×2 IMPLANT
STOCKINETTE IMPERVIOUS LG (DRAPES) IMPLANT
STRIP CLOSURE SKIN 1/2X4 (GAUZE/BANDAGES/DRESSINGS) ×2 IMPLANT
SUT ETHILON 2 0 FS 18 (SUTURE) ×3 IMPLANT
SUT MNCRL AB 4-0 PS2 18 (SUTURE) ×1 IMPLANT
SUT SILK 2 0 SH (SUTURE) IMPLANT
SUT VIC AB 3-0 54X BRD REEL (SUTURE) IMPLANT
SUT VIC AB 3-0 BRD 54 (SUTURE)
SUT VIC AB 3-0 SH 27 (SUTURE)
SUT VIC AB 3-0 SH 27X BRD (SUTURE) IMPLANT
SUT VICRYL 3-0 CR8 SH (SUTURE) ×2 IMPLANT
SYR CONTROL 10ML LL (SYRINGE) ×4 IMPLANT
TOWEL OR 17X24 6PK STRL BLUE (TOWEL DISPOSABLE) ×4 IMPLANT
TOWEL OR NON WOVEN STRL DISP B (DISPOSABLE) ×2 IMPLANT
TUBE CONNECTING 20X1/4 (TUBING) ×2 IMPLANT
YANKAUER SUCT BULB TIP NO VENT (SUCTIONS) ×2 IMPLANT

## 2014-03-07 NOTE — Transfer of Care (Signed)
Immediate Anesthesia Transfer of Care Note  Patient: Theresa Huff  Procedure(s) Performed: Procedure(s): LEFT TOTAL MASTECTOMY WITH LEFT SENTINEL LYMPH NODE BIOPSY (Left)  Patient Location: PACU  Anesthesia Type:General  Level of Consciousness: sedated  Airway & Oxygen Therapy: Patient Spontanous Breathing and Patient connected to face mask oxygen  Post-op Assessment: Report given to RN and Post -op Vital signs reviewed and stable  Post vital signs: Reviewed and stable  Last Vitals:  Filed Vitals:   03/07/14 1308  BP:   Pulse: 64  Temp:   Resp: 4    Complications: No apparent anesthesia complications

## 2014-03-07 NOTE — H&P (Signed)
64 yof who palpated a left breast mass Dec 26. Has not changed much since then.She had noticed in November that it looked like her left nipple was inverting and turning outwards. She denies discharge. She has no FH. She underwent mm that showed abnormality. US showed three separate masses. the largest was the 3.7 mass at 2 oclock. There are two other about 1 cm masses at 3 and 1 oclock. Biopsy of the two are morphologically identical and show grade I IDC that is er pos, pr pos, her 2 neg and Ki is 19% The third lesion has some lobular features. I have asked pathology to do separate prognostic panel on this one and will await those results. MRI shows a 5.6x2.7x3 cm mass on the left, right is negative no abnormal nodes seen. She is here today to discuss options.   Other Problems Francee Nodal, RN; 02/13/2014 8:43 AM) Back Pain Breast Cancer Depression Gastroesophageal Reflux Disease Melanoma Sleep Apnea  Past Surgical History Francee Nodal, RN; 02/13/2014 8:43 AM) Breast Biopsy Left. Oral Surgery Tonsillectomy  Diagnostic Studies History Francee Nodal, RN; 02/13/2014 8:43 AM) Colonoscopy 5-10 years ago Mammogram within last year Pap Smear 1-5 years ago  Social History Francee Nodal, RN; 02/13/2014 8:43 AM) Alcohol use Moderate alcohol use. Caffeine use Carbonated beverages. Illicit drug use Remotely quit drug use. Tobacco use Former smoker.  Family History Francee Nodal, RN; 02/13/2014 8:43 AM) Alcohol Abuse Father. Arthritis Mother. Depression Mother. Heart Disease Mother. Hypertension Father, Mother. Melanoma Brother. Migraine Headache Daughter, Family Members In General. Respiratory Condition Father. Thyroid problems Daughter, Family Members In General.  Pregnancy / Birth History Francee Nodal, RN; 02/13/2014 8:43 AM) Age at menarche 31 years. Age of menopause 13-50 Contraceptive History Intrauterine device, Oral contraceptives. Gravida  3 Irregular periods Maternal age 62-20 Para 2  Review of Systems Francee Nodal RN; 02/13/2014 8:43 AM) General Present- Appetite Loss and Weight Loss. Not Present- Chills, Fatigue, Fever, Night Sweats and Weight Gain. Skin Present- Dryness. Not Present- Change in Wart/Mole, Hives, Jaundice, New Lesions, Non-Healing Wounds, Rash and Ulcer. HEENT Present- Seasonal Allergies and Wears glasses/contact lenses. Not Present- Earache, Hearing Loss, Hoarseness, Nose Bleed, Oral Ulcers, Ringing in the Ears, Sinus Pain, Sore Throat, Visual Disturbances and Yellow Eyes. Respiratory Present- Snoring. Not Present- Bloody sputum, Chronic Cough, Difficulty Breathing and Wheezing. Breast Present- Breast Mass, Breast Pain and Skin Changes. Not Present- Nipple Discharge. Cardiovascular Present- Leg Cramps. Not Present- Chest Pain, Difficulty Breathing Lying Down, Palpitations, Rapid Heart Rate, Shortness of Breath and Swelling of Extremities. Gastrointestinal Present- Change in Bowel Habits, Constipation and Gets full quickly at meals. Not Present- Abdominal Pain, Bloating, Bloody Stool, Chronic diarrhea, Difficulty Swallowing, Excessive gas, Hemorrhoids, Indigestion, Nausea, Rectal Pain and Vomiting. Female Genitourinary Not Present- Frequency, Nocturia, Painful Urination, Pelvic Pain and Urgency. Musculoskeletal Not Present- Back Pain, Joint Pain, Joint Stiffness, Muscle Pain, Muscle Weakness and Swelling of Extremities. Neurological Present- Tremor. Not Present- Decreased Memory, Fainting, Headaches, Numbness, Seizures, Tingling, Trouble walking and Weakness. Psychiatric Present- Depression. Not Present- Anxiety, Bipolar, Change in Sleep Pattern, Fearful and Frequent crying. Endocrine Not Present- Cold Intolerance, Excessive Hunger, Hair Changes, Heat Intolerance, Hot flashes and New Diabetes. Hematology Present- Easy Bruising. Not Present- Excessive bleeding, Gland problems, HIV and Persistent  Infections.   Physical Exam Rolm Bookbinder MD; 02/13/2014 8:20 PM) General Mental Status-Alert. Orientation-Oriented X3.  Chest and Lung Exam Chest and lung exam reveals -on auscultation, normal breath sounds, no adventitious sounds and normal vocal resonance.  Breast Nipples-No Discharge. Breast -  Right-Normal. Note: left nipple is turned laterally and partially inverted left breast has small 1 cm mass palpable and adherent to areolar skin at lateral border with larger more amorphous area extending laterally measuring about 5 cm   Cardiovascular Cardiovascular examination reveals -normal heart sounds, regular rate and rhythm with no murmurs.  Lymphatic Head & Neck  General Head & Neck Lymphatics: Bilateral - Description - Normal. Axillary  General Axillary Region: Bilateral - Description - Normal. Note: no Alta adenopathy     Assessment & Plan Rolm Bookbinder MD; 02/13/2014 8:22 PM) STAGE III CARCINOMA OF BREAST, LEFT (174.9  C50.912) Story: Left total mastectomy with left axillary sentinel node biopsy

## 2014-03-07 NOTE — Interval H&P Note (Signed)
History and Physical Interval Note:  03/07/2014 1:00 PM  Theresa Huff  has presented today for surgery, with the diagnosis of LEFT BREAST CANCER  The various methods of treatment have been discussed with the patient and family. After consideration of risks, benefits and other options for treatment, the patient has consented to  Procedure(s): LEFT TOTAL MASTECTOMY WITH LEFT SENTINEL LYMPH NODE BIOPSY (Left) as a surgical intervention .  The patient's history has been reviewed, patient examined, no change in status, stable for surgery.  I have reviewed the patient's chart and labs.  Questions were answered to the patient's satisfaction.     Debi Cousin

## 2014-03-07 NOTE — Anesthesia Preprocedure Evaluation (Signed)
Anesthesia Evaluation  Patient identified by MRN, date of birth, ID band Patient awake    Reviewed: Allergy & Precautions, NPO status , Patient's Chart, lab work & pertinent test results  Airway Mallampati: I  TM Distance: >3 FB Neck ROM: Full    Dental  (+) Teeth Intact, Dental Advisory Given   Pulmonary former smoker,  breath sounds clear to auscultation        Cardiovascular Rhythm:Regular Rate:Normal     Neuro/Psych    GI/Hepatic   Endo/Other    Renal/GU      Musculoskeletal   Abdominal   Peds  Hematology   Anesthesia Other Findings   Reproductive/Obstetrics                             Anesthesia Physical Anesthesia Plan  ASA: II  Anesthesia Plan: General   Post-op Pain Management: MAC Combined w/ Regional for Post-op pain   Induction: Intravenous  Airway Management Planned: LMA  Additional Equipment:   Intra-op Plan:   Post-operative Plan: Extubation in OR  Informed Consent: I have reviewed the patients History and Physical, chart, labs and discussed the procedure including the risks, benefits and alternatives for the proposed anesthesia with the patient or authorized representative who has indicated his/her understanding and acceptance.   Dental advisory given  Plan Discussed with: CRNA, Anesthesiologist and Surgeon  Anesthesia Plan Comments:         Anesthesia Quick Evaluation

## 2014-03-07 NOTE — Op Note (Signed)
Preoperative diagnosis: clinical stage III left breast cancer Postoperative diagnosis: same as above Procedure: 1. Left total mastectomy 2. Left axillary sentinel node biopsy 3. Injection of blue dye for sentinel node identification Surgeon: Dr Serita Grammes Anesthesia: general with pectoral block Specimen: 1. Left breast marked short superior, long lateral 2. Sentinel node packet with highest count of 49 but blue dye appears in at least 2 nodes Complications none Drains: 19 Fr Blake  Sponge and needle count correct Dispo to pacu stable  Indications: This is a 64 year old female who noted a left breast mass. This area is involving her nipple areolar complex. On MRI this was over 5 cm. She clinically does not have any lymph node involvement. She was seen in the multidisciplinary clinic. She has elected to undergo a left total mastectomy with left axillary sentinel lymph node biopsy. She has declined reconstruction.  Procedure: After informed consent was obtained the patient first underwent a pectoral block as well as administration of technetium in the standard periareolar fashion. She was given 2 g of cefazolin. Sequential compression devices were on her legs. She was placed under general anesthesia without complication. Her left breast was then prepped and draped in the standard sterile surgical fashion. A surgical timeout was then performed.I infiltrated a mixture of methylene blue dye and saline in the periareolar area and massaged this for 2 minutes. I used the dye as there was not a lot of radioactivity in her axilla.  I made an elliptical incision encompassing both the tumor and the nipple areolar complex. Flaps were then created to the clavicle, parasternal region, inframammary crease, and the latissimus laterally. I then removed the breast in the pectoralis fascia from the muscle. This was then marked as above. She had very little radioactivity in the axilla. She did however have what  appeared to be 2 or possibly 3 lymph nodes that had blue dye in them. I removed these. There was no background radioactivity. There was no more blue dye that was present. I then obtained hemostasis. I placed a 109 [place] Blake drain in this space. I then closed this with 3-0 Vicryl and 4-0 Monocryl. Glue and Steri-Strips were applied. A Biopatch was applied to the drain. A breast binder was placed. She tolerated this well was extubated and transferred to recovery stable.

## 2014-03-07 NOTE — Anesthesia Procedure Notes (Signed)
Procedure Name: LMA Insertion Date/Time: 03/07/2014 1:31 PM Performed by: Maryella Shivers Pre-anesthesia Checklist: Patient identified, Emergency Drugs available, Suction available and Patient being monitored Patient Re-evaluated:Patient Re-evaluated prior to inductionOxygen Delivery Method: Circle System Utilized Preoxygenation: Pre-oxygenation with 100% oxygen Intubation Type: IV induction Ventilation: Mask ventilation without difficulty LMA: LMA inserted LMA Size: 4.0 Number of attempts: 1 Airway Equipment and Method: Bite block Placement Confirmation: positive ETCO2 Tube secured with: Tape Dental Injury: Teeth and Oropharynx as per pre-operative assessment

## 2014-03-07 NOTE — Progress Notes (Signed)
Emotional support during breast injections °

## 2014-03-07 NOTE — Anesthesia Postprocedure Evaluation (Signed)
  Anesthesia Post-op Note  Patient: Theresa Huff  Procedure(s) Performed: Procedure(s): LEFT TOTAL MASTECTOMY WITH LEFT SENTINEL LYMPH NODE BIOPSY (Left)  Patient Location: PACU  Anesthesia Type: General   Level of Consciousness: awake, alert  and oriented  Airway and Oxygen Therapy: Patient Spontanous Breathing  Post-op Pain: mild  Post-op Assessment: Post-op Vital signs reviewed  Post-op Vital Signs: Reviewed  Last Vitals:  Filed Vitals:   03/07/14 1530  BP: 121/69  Pulse: 67  Temp:   Resp: 14    Complications: No apparent anesthesia complications

## 2014-03-07 NOTE — Progress Notes (Signed)
Assisted Dr. Crews with left, ultrasound guided, pectoralis block. Side rails up, monitors on throughout procedure. See vital signs in flow sheet. Tolerated Procedure well. 

## 2014-03-08 DIAGNOSIS — C50912 Malignant neoplasm of unspecified site of left female breast: Secondary | ICD-10-CM | POA: Diagnosis not present

## 2014-03-08 MED ORDER — OXYCODONE HCL 5 MG PO TABS: 5.0000 mg | ORAL_TABLET | ORAL | Status: DC | PRN

## 2014-03-08 NOTE — Discharge Instructions (Signed)
[facility] °[phone] ° °MASTECTOMY: POST OP INSTRUCTIONS ° °Always review your discharge instruction sheet given to you by the facility where your surgery was performed. °IF YOU HAVE DISABILITY OR FAMILY LEAVE FORMS, YOU MUST BRING THEM TO THE OFFICE FOR PROCESSING.   °DO NOT GIVE THEM TO YOUR DOCTOR. °A prescription for pain medication may be given to you upon discharge.  Take your pain medication as prescribed, if needed.  If narcotic pain medicine is not needed, then you may take acetaminophen (Tylenol), naprosyn (Alleve) or ibuprofen (Advil) as needed. °1. Take your usually prescribed medications unless otherwise directed. °2. If you need a refill on your pain medication, please contact your pharmacy.  They will contact our office to request authorization.  Prescriptions will not be filled after 5pm or on week-ends. °3. You should follow a light diet the first few days after arrival home, such as soup and crackers, etc.  Resume your normal diet the day after surgery. °4. Most patients will experience some swelling and bruising on the chest and underarm.  Ice packs will help.  Swelling and bruising can take several days to resolve. Wear the binder day and night until you return to the office.  °5. It is common to experience some constipation if taking pain medication after surgery.  Increasing fluid intake and taking a stool softener (such as Colace) will usually help or prevent this problem from occurring.  A mild laxative (Milk of Magnesia or Miralax) should be taken according to package instructions if there are no bowel movements after 48 hours. °6. Unless discharge instructions indicate otherwise, leave your bandage dry and in place until your next appointment in 3-5 days.  You may take a limited sponge bath.  No tube baths or showers until the drains are removed.  You may have steri-strips (small skin tapes) in place directly over the incision.  These strips should be left on the  skin for 7-10 days. If you have glue it will come off in next couple week.  Any sutures will be removed at an office visit °7. DRAINS:  If you have drains in place, it is important to keep a list of the amount of drainage produced each day in your drains.  Before leaving the hospital, you should be instructed on drain care.  Call our office if you have any questions about your drains. I will remove your drains when they put out less than 30 cc or ml for 2 consecutive days. °8. ACTIVITIES:  You may resume regular (light) daily activities beginning the next day--such as daily self-care, walking, climbing stairs--gradually increasing activities as tolerated.  You may have sexual intercourse when it is comfortable.  Refrain from any heavy lifting or straining until approved by your doctor. °a. You may drive when you are no longer taking prescription pain medication, you can comfortably wear a seatbelt, and you can safely maneuver your car and apply brakes. °b. RETURN TO WORK:  __________________________________________________________ °9. You should see your doctor in the office for a follow-up appointment approximately 3-5 days after your surgery.  Your doctor’s nurse will typically make your follow-up appointment when she calls you with your pathology report.  Expect your pathology report 3-4business days after surgery. °10. OTHER INSTRUCTIONS: ______________________________________________________________________________________________ ____________________________________________________________________________________________ °WHEN TO CALL YOUR DR [place]: °1. Fever over 101.0 °2. Nausea and/or vomiting °3. Extreme swelling or bruising °4. Continued bleeding from incision. °5. Increased pain, redness, or drainage from the incision. °The clinic staff is available   to answer your questions during regular business hours.  Please don’t hesitate to call and ask to speak to one of the nurses for clinical concerns.  If  you have a medical emergency, go to the nearest emergency room or call 911.  A surgeon from [facility] is always on call at the hospital. °[address], [address], [place], [place]  [address] ? [address] [address], [place], [place]   [address] °[phone] ? [phone] ? FAX [phone] °Web site: [email or web address] ° ° ° °About my Jackson-Pratt Bulb Drain ° °What is a Jackson-Pratt bulb? °A Jackson-Pratt is a soft, round device used to collect drainage. It is connected to a long, thin drainage catheter, which is held in place by one or two small stiches near your surgical incision site. When the bulb is squeezed, it forms a vacuum, forcing the drainage to empty into the bulb. ° °Emptying the Jackson-Pratt bulb- °To empty the bulb: °1. Release the plug on the top of the bulb. °2. Pour the bulb's contents into a measuring container which your nurse will provide. °3. Record the time emptied and amount of drainage. Empty the drain(s) as often as your     doctor or nurse recommends. ° °Date                  Time                    Amount (Drain 1)                 Amount (Drain 2) ° °_____________________________________________________________________ ° °_____________________________________________________________________ ° °_____________________________________________________________________ ° °_____________________________________________________________________ ° °_____________________________________________________________________ ° °_____________________________________________________________________ ° °_____________________________________________________________________ ° °_____________________________________________________________________ ° °Squeezing the Jackson-Pratt Bulb- °To squeeze the bulb: °1. Make sure the plug at the top of the bulb is open. °2. Squeeze the bulb tightly in your fist. You will hear air squeezing from the bulb. °3. Replace the plug while the bulb is  squeezed. °4. Use a safety pin to attach the bulb to your clothing. This will keep the catheter from     pulling at the bulb insertion site. ° °When to call your doctor- °Call your doctor if: °· Drain site becomes red, swollen or hot. °· You have a fever greater than 101 degrees F. °· There is oozing at the drain site. °· Drain falls out (apply a guaze bandage over the drain hole and secure it with tape). °· Drainage increases daily not related to activity patterns. (You will usually have more drainage when you are active than when you are resting.) °· Drainage has a bad odor. ° ° °

## 2014-03-11 ENCOUNTER — Encounter (HOSPITAL_BASED_OUTPATIENT_CLINIC_OR_DEPARTMENT_OTHER): Payer: Self-pay | Admitting: General Surgery

## 2014-03-15 ENCOUNTER — Other Ambulatory Visit (INDEPENDENT_AMBULATORY_CARE_PROVIDER_SITE_OTHER): Payer: Self-pay | Admitting: General Surgery

## 2014-03-22 ENCOUNTER — Telehealth: Payer: Self-pay | Admitting: Hematology and Oncology

## 2014-03-22 ENCOUNTER — Telehealth: Payer: Self-pay | Admitting: *Deleted

## 2014-03-22 ENCOUNTER — Ambulatory Visit: Payer: 59 | Admitting: Neurology

## 2014-03-22 ENCOUNTER — Ambulatory Visit (HOSPITAL_BASED_OUTPATIENT_CLINIC_OR_DEPARTMENT_OTHER): Payer: 59 | Admitting: Hematology and Oncology

## 2014-03-22 VITALS — BP 110/69 | HR 79 | Temp 98.5°F | Resp 18 | Ht 65.0 in | Wt 149.2 lb

## 2014-03-22 DIAGNOSIS — C50412 Malignant neoplasm of upper-outer quadrant of left female breast: Secondary | ICD-10-CM

## 2014-03-22 DIAGNOSIS — Z170 Estrogen receptor positive status [ER+]: Secondary | ICD-10-CM

## 2014-03-22 DIAGNOSIS — C773 Secondary and unspecified malignant neoplasm of axilla and upper limb lymph nodes: Secondary | ICD-10-CM

## 2014-03-22 MED ORDER — ONDANSETRON HCL 8 MG PO TABS: 8.0000 mg | ORAL_TABLET | Freq: Two times a day (BID) | ORAL | Status: DC | PRN

## 2014-03-22 MED ORDER — DEXAMETHASONE 4 MG PO TABS: ORAL_TABLET | ORAL | Status: DC

## 2014-03-22 MED ORDER — LIDOCAINE-PRILOCAINE 2.5-2.5 % EX CREA: TOPICAL_CREAM | CUTANEOUS | Status: DC

## 2014-03-22 MED ORDER — PROCHLORPERAZINE MALEATE 10 MG PO TABS: 10.0000 mg | ORAL_TABLET | Freq: Four times a day (QID) | ORAL | Status: DC | PRN

## 2014-03-22 MED ORDER — LORAZEPAM 0.5 MG PO TABS: 0.5000 mg | ORAL_TABLET | Freq: Four times a day (QID) | ORAL | Status: DC | PRN

## 2014-03-22 NOTE — Telephone Encounter (Signed)
Per staff message and POF I have scheduled appts. Advised scheduler of appts. JMW  

## 2014-03-22 NOTE — Telephone Encounter (Signed)
per pof to sch pty appt-gave pt copy of sch-sent emailt o MW to sch trmt-sent email to [facility] to pre-cert [facility] call pt after reply

## 2014-03-22 NOTE — Progress Notes (Signed)
Patient Care Team: Gara Kroner, MD as PCP - General (Family Medicine) Elsie Stain, MD (Pulmonary Disease) Rolm Bookbinder, MD as Consulting Physician (General Surgery) Rulon Eisenmenger, MD as Consulting Physician (Hematology and Oncology) [name], MD as Consulting Physician (Radiation Oncology) Roselee Culver, RN as Registered Nurse [facility], RN as Registered Nurse Trinda Pascal, NP as Nurse Practitioner (Nurse Practitioner)  DIAGNOSIS: Breast cancer of upper-outer quadrant of left female breast   Staging form: Breast, AJCC 7th Edition     Clinical stage from 02/13/2014: Stage IIB (T3, N0, M0) - Unsigned       Staging comments: Staged at breast conference on 1.20.16      Pathologic stage from 03/11/2014: Stage IIB (T2, N1a, cM0) - Signed by Seward Grater, MD on 03/19/2014       Staging comments: Staged on final mastectomy specimen by Dr. Avis Epley    SUMMARY OF ONCOLOGIC HISTORY:   Breast cancer of upper-outer quadrant of left female breast   02/04/2014 Initial Diagnosis left breast: Invasive ductal carcinoma with DCIS ER 100%, PR 100%, HER-2 negative, Ki-67 ranged from 19-26%; third biopsy showed invasive mammary cancer with lobular features and intraductal papilloma, ER/PR positive HER-2 negative   02/11/2014 Breast MRI left breast: 3 masses that are confluent measuring 5.6 x 2.7 x 3 cm extends to the left aspect of area along the penis to involve skin of the area no lymph nodes detected right breast negative   03/07/2014 Surgery Left breast invasive ductal carcinoma grade 1; 4.8 cm with low-grade DCIS; margins negative 1/4 lymph nodes positive, ER 100% PR 100% HER-2 negative ratio 1.18 Ki-67 26% and 19% T2 N1 stage IIB    CHIEF COMPLIANT: Follow-up after surgery to discuss treatment options  INTERVAL HISTORY: Theresa Huff is a 64 year old lady with above-mentioned history of left-sided breast cancer who underwent left mastectomy and is here today  to discuss pathology report and treatment options. She is accompanied by HER-2 daughters. She is recovering very well from surgery without any major problems or concerns.  REVIEW OF SYSTEMS:   Constitutional: Denies fevers, chills or abnormal weight loss Eyes: Denies blurriness of vision Ears, nose, mouth, throat, and face: Denies mucositis or sore throat Respiratory: Denies cough, dyspnea or wheezes Cardiovascular: Denies palpitation, chest discomfort or lower extremity swelling Gastrointestinal:  Denies nausea, heartburn or change in bowel habits Skin: Denies abnormal skin rashes Lymphatics: Denies new lymphadenopathy or easy bruising Neurological:Denies numbness, tingling or new weaknesses Behavioral/Psych: Mood is stable, no new changes  Breast: Recovering well from surgery. All other systems were reviewed with the patient and are negative.  I have reviewed the past medical history, past surgical history, social history and family history with the patient and they are unchanged from previous note.  ALLERGIES:  is allergic to codeine; iodine; primidone; and sulfa antibiotics.  MEDICATIONS:  Current Outpatient Prescriptions  Medication Sig Dispense Refill  . calcium carbonate (OS-CAL) 600 MG TABS tablet Take 600 mg by mouth daily.    . cetirizine (ZYRTEC) 10 MG tablet Take 10 mg by mouth daily.      . Cholecalciferol (VITAMIN D PO) Take 2,000 Units by mouth daily.    . DULoxetine (CYMBALTA) 60 MG capsule Take 30 mg by mouth daily.     Marland Kitchen esomeprazole (NEXIUM) 20 MG packet Take 20 mg by mouth daily before breakfast.    . Eszopiclone (ESZOPICLONE) 3 MG TABS Take 3 mg by mouth at bedtime. Take immediately before bedtime    .  mometasone (NASONEX) 50 MCG/ACT nasal spray Place 2 sprays into the nose daily.      Marland Kitchen oxyCODONE (OXY IR/ROXICODONE) 5 MG immediate release tablet Take 1-2 tablets (5-10 mg total) by mouth every 4 (four) hours as needed for moderate pain. 30 tablet 0  . Vitamin D,  Ergocalciferol, (DRISDOL) 50000 UNITS CAPS capsule Take 50,000 Units by mouth every 7 (seven) days.    Marland Kitchen dexamethasone (DECADRON) 4 MG tablet Take 2 tablets by mouth once a day on the day after chemotherapy and then take 2 tablets two times a day for 2 days. Take with food. 30 tablet 1  . lidocaine-prilocaine (EMLA) cream Apply to affected area once 30 g 3  . LORazepam (ATIVAN) 0.5 MG tablet Take 1 tablet (0.5 mg total) by mouth every 6 (six) hours as needed (Nausea or vomiting). 30 tablet 0  . ondansetron (ZOFRAN) 8 MG tablet Take 1 tablet (8 mg total) by mouth 2 (two) times daily as needed. Start on the third day after chemotherapy. 30 tablet 1  . prochlorperazine (COMPAZINE) 10 MG tablet Take 1 tablet (10 mg total) by mouth every 6 (six) hours as needed (Nausea or vomiting). 30 tablet 1   No current facility-administered medications for this visit.    PHYSICAL EXAMINATION: ECOG PERFORMANCE STATUS: 1 - Symptomatic but completely ambulatory  Filed Vitals:   03/22/14 0906  BP: 110/69  Pulse: 79  Temp: 98.5 F (36.9 C)  Resp: 18   Filed Weights   03/22/14 0906  Weight: 149 lb 3.2 oz (67.677 kg)    GENERAL:alert, no distress and comfortable SKIN: skin color, texture, turgor are normal, no rashes or significant lesions EYES: normal, Conjunctiva are pink and non-injected, sclera clear OROPHARYNX:no exudate, no erythema and lips, buccal mucosa, and tongue normal  NECK: supple, thyroid normal size, non-tender, without nodularity LYMPH:  no palpable lymphadenopathy in the cervical, axillary or inguinal LUNGS: clear to auscultation and percussion with normal breathing effort HEART: regular rate & rhythm and no murmurs and no lower extremity edema ABDOMEN:abdomen soft, non-tender and normal bowel sounds Musculoskeletal:no cyanosis of digits and no clubbing  NEURO: alert & oriented x 3 with fluent speech, no focal motor/sensory deficits   LABORATORY DATA:  I have reviewed the data as  listed   Chemistry      Component Value Date/Time   NA 142 02/13/2014 0816   NA 140 09/14/2010 1708   K 4.4 02/13/2014 0816   K 4.5 09/14/2010 1708   CL 102 09/14/2010 1708   CO2 28 02/13/2014 0816   CO2 30 09/14/2010 1708   BUN 14.1 02/13/2014 0816   BUN 14 09/14/2010 1708   CREATININE 0.8 02/13/2014 0816   CREATININE 0.8 09/14/2010 1708      Component Value Date/Time   CALCIUM 9.3 02/13/2014 0816   CALCIUM 9.2 09/14/2010 1708   ALKPHOS 73 02/13/2014 0816   ALKPHOS 76 09/14/2010 1708   AST 15 02/13/2014 0816   AST 18 09/14/2010 1708   ALT 13 02/13/2014 0816   ALT 21 09/14/2010 1708   BILITOT 0.54 02/13/2014 0816   BILITOT 0.4 09/14/2010 1708       Lab Results  Component Value Date   WBC 5.4 02/13/2014   HGB 13.9 02/13/2014   HCT 42.0 02/13/2014   MCV 91.9 02/13/2014   PLT 254 02/13/2014   NEUTROABS 2.9 02/13/2014   ASSESSMENT & PLAN:  Breast cancer of upper-outer quadrant of left female breast Left breast invasive ductal carcinoma grade 1; 4.8  cm with low-grade DCIS; margins negative 1/4 lymph nodes positive, ER 100% PR 100% HER-2 negative ratio 1.18 Ki-67 26% and 19% T2 N1 stage IIB  Pathology review: I discussed the pathology report in great detail including the fact that there is lymph node involvement 1 out of 4 lymph nodes. Because of lymph node positive disease, I do not recommend Oncotype DX testing.  Recommendation: 1. Systemic chemotherapy with dose dense Adriamycin and Cytoxan 4 followed by Abraxane weekly 12 2. Followed by adjuvant radiation 3. Followed by adjuvant antiestrogen therapy with anastrozole for 5 years  Chemotherapy counseling:I have discussed the risks and benefits of chemotherapy including the risks of nausea/ vomiting, risk of infection from low WBC count, fatigue due to chemo or anemia, bruising or bleeding due to low platelets, mouth sores, loss/ change in taste and decreased appetite. Liver and kidney function will be monitored  through out chemotherapy as abnormalities in liver and kidney function may be a side effect of treatment. Cardiac dysfunction due to Adriamycin was discussed in detail.Risk of permanent bone marrow dysfunction and leukemia due to chemo were also discussed.  Plan: 1. Port placement 2. Echocardiogram 3. Chemotherapy education 4. Plan to start chemotherapy in about 2 weeks    Orders Placed This Encounter  Procedures  . CBC with Differential    Standing Status: Standing     Number of Occurrences: 20     Standing Expiration Date: 03/23/2015  . Comprehensive metabolic panel    Standing Status: Standing     Number of Occurrences: 20     Standing Expiration Date: 03/23/2015  . PHYSICIAN COMMUNICATION ORDER    A baseline Echo/ [place] should be obtained prior to initiation of Anthracycline Chemotherapy  . 2D Echocardiogram without contrast    Standing Status: Future     Number of Occurrences:      Standing Expiration Date: 03/22/2015    Scheduling Instructions:     PROVIDERS If this is NOT for EF then please REASON that  needs COMPLETE STUDY    Order Specific Question:  Type of Echo    Answer:  Complete    Order Specific Question:  Reason for Exam    Answer:  Pre-chemo    Order Specific Question:  Where should this test be performed    Answer:  [facility]   The patient has a good understanding of the overall plan. she agrees with it. She will call with any problems that may develop before her next visit here.   Rulon Eisenmenger, MD

## 2014-03-22 NOTE — Assessment & Plan Note (Addendum)
Left breast invasive ductal carcinoma grade 1; 4.8 cm with low-grade DCIS; margins negative 1/4 lymph nodes positive, ER 100% PR 100% HER-2 negative ratio 1.18 Ki-67 26% and 19% T2 N1 stage IIB  Pathology review: I discussed the pathology report in great detail including the fact that there is lymph node involvement 1 out of 4 lymph nodes. Because of lymph node positive disease, I do not recommend Oncotype DX testing.  Recommendation: 1. Systemic chemotherapy with dose dense Adriamycin and Cytoxan 4 followed by Abraxane weekly 12 2. Followed by adjuvant radiation 3. Followed by adjuvant antiestrogen therapy with anastrozole for 5 years  Chemotherapy counseling:I have discussed the risks and benefits of chemotherapy including the risks of nausea/ vomiting, risk of infection from low WBC count, fatigue due to chemo or anemia, bruising or bleeding due to low platelets, mouth sores, loss/ change in taste and decreased appetite. Liver and kidney function will be monitored through out chemotherapy as abnormalities in liver and kidney function may be a side effect of treatment. Cardiac dysfunction due to Adriamycin was discussed in detail.Risk of permanent bone marrow dysfunction and leukemia due to chemo were also discussed.  Plan: 1. Port placement 2. Echocardiogram 3. Chemotherapy education 4. Plan to start chemotherapy in about 2 weeks

## 2014-03-26 ENCOUNTER — Other Ambulatory Visit: Payer: 59

## 2014-03-26 ENCOUNTER — Inpatient Hospital Stay: Payer: 59

## 2014-03-26 ENCOUNTER — Ambulatory Visit: Payer: Self-pay | Admitting: Neurology

## 2014-03-26 ENCOUNTER — Ambulatory Visit (HOSPITAL_COMMUNITY)
Admission: RE | Admit: 2014-03-26 | Discharge: 2014-03-26 | Disposition: A | Discharge status: Home / Self Care | Payer: 59 | Source: Ambulatory Visit | Attending: Hematology and Oncology | Admitting: Hematology and Oncology

## 2014-03-26 DIAGNOSIS — C50412 Malignant neoplasm of upper-outer quadrant of left female breast: Secondary | ICD-10-CM | POA: Diagnosis not present

## 2014-03-26 DIAGNOSIS — C50919 Malignant neoplasm of unspecified site of unspecified female breast: Secondary | ICD-10-CM

## 2014-03-26 DIAGNOSIS — Z01818 Encounter for other preprocedural examination: Secondary | ICD-10-CM | POA: Diagnosis not present

## 2014-03-26 DIAGNOSIS — Z0189 Encounter for other specified special examinations: Secondary | ICD-10-CM

## 2014-03-26 NOTE — Progress Notes (Signed)
Echocardiogram 2D Echocardiogram has been performed.  Mikaelah Trostle 03/26/2014, 11:45 AM

## 2014-03-27 ENCOUNTER — Other Ambulatory Visit (HOSPITAL_COMMUNITY): Payer: Self-pay | Admitting: *Deleted

## 2014-03-27 ENCOUNTER — Encounter (HOSPITAL_COMMUNITY): Payer: Self-pay

## 2014-03-27 ENCOUNTER — Encounter (HOSPITAL_COMMUNITY)
Admission: RE | Admit: 2014-03-27 | Discharge: 2014-03-27 | Disposition: A | Discharge status: Home / Self Care | Payer: 59 | Source: Ambulatory Visit | Attending: General Surgery | Admitting: General Surgery

## 2014-03-27 DIAGNOSIS — C50919 Malignant neoplasm of unspecified site of unspecified female breast: Secondary | ICD-10-CM | POA: Diagnosis not present

## 2014-03-27 DIAGNOSIS — Z01812 Encounter for preprocedural laboratory examination: Secondary | ICD-10-CM | POA: Insufficient documentation

## 2014-03-27 HISTORY — DX: Anemia, unspecified: D64.9

## 2014-03-27 LAB — BASIC METABOLIC PANEL
Anion gap: 5 (ref 5–15)
BUN: 9 mg/dL (ref 6–23)
CHLORIDE: 108 mmol/L (ref 96–112)
CO2: 28 mmol/L (ref 19–32)
CREATININE: 0.78 mg/dL (ref 0.50–1.10)
Calcium: 9.5 mg/dL (ref 8.4–10.5)
GFR calc Af Amer: >90 mL/min (ref >90)
GFR calc non Af Amer: 88 mL/min — ABNORMAL LOW (ref >90)
Glucose, Bld: 95 mg/dL (ref 70–99)
Potassium: 4 mmol/L (ref 3.5–5.1)
Sodium: 141 mmol/L (ref 135–145)

## 2014-03-27 LAB — CBC WITH DIFFERENTIAL/PLATELET
BASOS ABS: 0.1 10*3/uL (ref 0.0–0.1)
BASOS PCT: 1 % (ref 0–1)
Eosinophils Absolute: 0.4 10*3/uL (ref 0.0–0.7)
Eosinophils Relative: 5 % (ref 0–5)
HCT: 41.8 % (ref 36.0–46.0)
Hemoglobin: 13.5 g/dL (ref 12.0–15.0)
LYMPHS PCT: 43 % (ref 12–46)
Lymphs Abs: 3.5 10*3/uL (ref 0.7–4.0)
MCH: 29.7 pg (ref 26.0–34.0)
MCHC: 32.3 g/dL (ref 30.0–36.0)
MCV: 92.1 fL (ref 78.0–100.0)
MONO ABS: 0.6 10*3/uL (ref 0.1–1.0)
MONOS PCT: 7 % (ref 3–12)
NEUTROS ABS: 3.5 10*3/uL (ref 1.7–7.7)
Neutrophils Relative %: 44 % (ref 43–77)
Platelets: 290 10*3/uL (ref 150–400)
RBC: 4.54 MIL/uL (ref 3.87–5.11)
RDW: 13.6 % (ref 11.5–15.5)
WBC: 8.1 10*3/uL (ref 4.0–10.5)

## 2014-03-27 NOTE — Pre-Procedure Instructions (Signed)
Theresa Huff  03/27/2014   Your procedure is scheduled on:  Monday, April 01, 2014 at 10:25 AM.   Report to [facility] Entrance "A" Admitting Office at 8:30 AM.   Call this number if you have problems the morning of surgery: [phone]               Any questions prior to day of surgery, please call [phone] between 8 & 4 PM.   Remember:   Do not eat food or drink liquids after midnight Sunday, 03/31/14.   Take these medicines the morning of surgery with A SIP OF WATER: Cetirizine (Zyrtec), Duloxetine (Cymbalta), Esomeprazole (Nexium), Nasonex nasal spray, Oxycodone - if needed, Zofran - if needed.   Do not wear jewelry, make-up or nail polish.  Do not wear lotions, powders, or perfumes. You may wear deodorant.  Do not shave 48 hours prior to surgery.    Do not bring valuables to the hospital.  [facility] is not responsible                  for any belongings or valuables.               Contacts, dentures or bridgework may not be worn into surgery.  Leave suitcase in the car. After surgery it may be brought to your room.  For patients admitted to the hospital, discharge time is determined by your                treatment team.               Patients discharged the day of surgery will not be allowed to drive home.    Special Instructions: [place] - Preparing for Surgery  Before surgery, you can play an important role.  Because skin is not sterile, your skin needs to be as free of germs as possible.  You can reduce the number of germs on you skin by washing with CHG (chlorahexidine gluconate) soap before surgery.  CHG is an antiseptic cleaner which kills germs and bonds with the skin to continue killing germs even after washing.  Please DO NOT use if you have an allergy to CHG or antibacterial soaps.  If your skin becomes reddened/irritated stop using the CHG and inform your nurse when you arrive at Short Stay.  Do not shave (including legs and underarms) for at least  48 hours prior to the first CHG shower.  You may shave your face.  Please follow these instructions carefully:   1.  Shower with CHG Soap the night before surgery and the                                morning of Surgery.  2.  If you choose to wash your hair, wash your hair first as usual with your       normal shampoo.  3.  After you shampoo, rinse your hair and body thoroughly to remove the                      Shampoo.  4.  Use CHG as you would any other liquid soap.  You can apply chg directly       to the skin and wash gently with scrungie or a clean washcloth.  5.  Apply the CHG Soap to your body ONLY FROM THE NECK DOWN.  Do not use on open wounds or open sores.  Avoid contact with your eyes, ears, mouth and genitals (private parts).  Wash genitals (private parts) with your normal soap.  6.  Wash thoroughly, paying special attention to the area where your surgery        will be performed.  7.  Thoroughly rinse your body with warm water from the neck down.  8.  DO NOT shower/wash with your normal soap after using and rinsing off       the CHG Soap.  9.  Pat yourself dry with a clean towel.            10.  Wear clean pajamas.            11.  Place clean sheets on your bed the night of your first shower and do not        sleep with pets.  Day of Surgery  Do not apply any lotions the morning of surgery.  Please wear clean clothes to the hospital.     Please read over the following fact sheets that you were given: Pain Booklet, Coughing and Deep Breathing and Surgical Site Infection Prevention

## 2014-03-28 ENCOUNTER — Ambulatory Visit: Payer: 59 | Attending: General Surgery | Admitting: Physical Therapy

## 2014-03-28 DIAGNOSIS — C50912 Malignant neoplasm of unspecified site of left female breast: Secondary | ICD-10-CM | POA: Diagnosis not present

## 2014-03-28 DIAGNOSIS — M6281 Muscle weakness (generalized): Secondary | ICD-10-CM

## 2014-03-28 DIAGNOSIS — M439 Deforming dorsopathy, unspecified: Secondary | ICD-10-CM | POA: Insufficient documentation

## 2014-03-28 DIAGNOSIS — R609 Edema, unspecified: Secondary | ICD-10-CM

## 2014-03-28 NOTE — Therapy (Signed)
[place], [place], [address] Phone: [phone]   Fax:  [phone]  Physical Therapy Evaluation  Patient Details  Name: Theresa Huff MRN: [id number] Date of Birth: [birth year] Referring Provider:  Gara Kroner, MD  Encounter Date: 03/28/2014      PT End of Session - 03/28/14 1613    Visit Number 1   Number of Visits 9   Date for PT Re-Evaluation 04/25/14   PT Start Time 3818   PT Stop Time 1600   PT Time Calculation (min) 45 min   Activity Tolerance Patient tolerated treatment well   Behavior During Therapy [facility] for tasks assessed/performed      Past Medical History  Diagnosis Date  . Cough   . Weight gain   . Hot flashes   . Insomnia, unspecified   . Depressive disorder, not elsewhere classified   . Allergic rhinitis due to pollen   . Plantar fasciitis   . Restless leg syndrome 09/09/2010  . Breast cancer   . Skin cancer   . OSA (obstructive sleep apnea) 09/09/2010    mild-no cpap recommended  . GERD (gastroesophageal reflux disease)   . Wears glasses   . Anemia     with pregnancy    Past Surgical History  Procedure Laterality Date  . Tonsillectomy    . Melanoma excision      right leg, left arm, right arm (several from each area)  . Colonoscopy    . Mastectomy w/ sentinel node biopsy Left 03/07/2014    Procedure: LEFT TOTAL MASTECTOMY WITH LEFT SENTINEL LYMPH NODE BIOPSY;  Surgeon: Rolm Bookbinder, MD;  Location: [facility];  Service: General;  Laterality: Left;    There were no vitals taken for this visit.  Visit Diagnosis:  Postoperative edema  Muscular weakness      Subjective Assessment - 03/28/14 1521    Symptoms pt has been soreness and swelling in left antecubital fossa on Tuesday of this week    Patient Stated Goals make sure that she doesn't have lymphedema and that she is set for her port insertion on Monday   Currently in Pain? Yes   Pain  Score 4    Pain Location Arm   Pain Orientation Left   Pain Descriptors / Indicators Tightness   Pain Type Acute pain          [facility] PT Assessment - 03/28/14 0001    Assessment   Medical Diagnosis Left breast cancer   Onset Date 01/30/14   Precautions   Precautions Other (comment)  Active left breast cancer   Restrictions   Weight Bearing Restrictions No   Balance Screen   Has the patient fallen in the past 6 months No   Has the patient had a decrease in activity level because of a fear of falling?  No   Is the patient reluctant to leave their home because of a fear of falling?  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   Sensation   Light Touch Appears Intact   Coordination   Gross Motor Movements are Fluid and Coordinated Yes   Posture/Postural Control   Posture/Postural Control Postural limitations   Postural Limitations Rounded Shoulders;Forward head   AROM   Right Shoulder Extension --   Right Shoulder Flexion --   Right Shoulder ABduction --   Right Shoulder Internal Rotation --   Right Shoulder External Rotation --   Left Shoulder Extension --   Left Shoulder Flexion 161 Degrees   Left Shoulder ABduction 167 Degrees   Left Shoulder Internal Rotation --   Left Shoulder External Rotation --   Strength   Overall Strength Deficits   Right Shoulder Flexion 5/5    Right Shoulder ABduction 5/5   Left Shoulder Flexion 4/5   Left Shoulder ABduction 4/5   Palpation   Palpation palpable edema at forearm and axilla            LYMPHEDEMA/ONCOLOGY QUESTIONNAIRE - 03/28/14 1545    Left Upper Extremity Lymphedema   15 cm Proximal to Olecranon Process 30.9 cm   10 cm Proximal to Olecranon Process 30.4 cm   Olecranon Process 25.3 cm   15 cm Proximal to Ulnar Styloid Process 23.5 cm   10 cm Proximal to Ulnar Styloid Process 21 cm   Just Proximal to Ulnar Styloid Process 15.2 cm   Across Hand at [employer] 17.8 cm   At [place] of 2nd Digit 5.6 cm           Quick Dash - 03/28/14 0001    Open a tight or new jar Mild difficulty   Do heavy household chores (wash walls, wash floors) Mild difficulty   Carry a shopping bag or briefcase Mild difficulty   Wash your back No difficulty   Use a knife to cut food No difficulty   Recreational activities in which you take some force or impact through your arm, shoulder, or hand (golf, hammering, tennis) Mild difficulty   During the past week, to what extent has your arm, shoulder or hand problem interfered with your normal social activities with family, friends, neighbors, or groups? Not at all   During the past week, to what extent has your arm, shoulder or hand problem limited your work or other regular daily activities Not at all   Arm, shoulder, or hand pain. None   Tingling (pins and needles) in your arm, shoulder, or hand None   Difficulty Sleeping No difficulty   DASH Score 9.09 %           PT Education - 03/28/14 1618    Education provided Yes   Education Details began lymphededma eductiona:: issued weblink information from [email or web address] for lymphedema eduction session   Person(s) Educated Patient   Methods Explanation;Demonstration;Handout   Comprehension Verbalized understanding           Short Term Clinic Goals - 03/28/14 1627    CC Short Term Goal  #1   Title short term =  long term           Breast Clinic Goals - 02/13/14 1043    Patient will be able to verbalize understanding of pertinent lymphedema risk reduction practices relevant to her diagnosis specifically related to skin care.   Time 1   Period Days   Status Achieved   Patient will  be able to return demonstrate and/or verbalize understanding of the post-op home exercise program related to regaining shoulder range of motion.   Time 1   Period Days   Status Achieved   Patient will be able to verbalize understanding of the importance of attending the postoperative After Breast Cancer Class for further lymphedema risk reduction education and therapeutic exercise.   Time 1   Period Days   Status Achieved          Long Term Clinic Goals - 03/28/14 1627    CC Long Term Goal  #1   Title pt will verbalize understanding of lymphedema risk reduction practices   Time 4   Period Weeks   Status New   CC Long Term Goal  #2   Title Pt will reports 50% reduction in pain and swellling from axillary cording   Time 4   Period Weeks   Status New   CC Long Term Goal  #3   Title Pt will verbalize understanding of progressive resistance strength training   Time 4   Period Weeks   Status New   CC Long Term Goal  #4   Title pt will have decrease in .5 cm left arm circumference at 10 cm proximal to the olecranon   Time 4   Period Weeks   Status New            Plan - 03/28/14 1614    Clinical Impression Statement pt with edema in axilla, upper arm and elbow. with axillary cording palpable .  She is preparing for chemotherapy and possibly radiation therapy.  Issued small Tg soft for UE lymphatic support and pt instructed to remove it with any pain or increasead sensation of fullness at anterior axilla    Pt will benefit from skilled therapeutic intervention in order to improve on the following deficits Increased edema;Decreased knowledge of precautions;Pain;Impaired UE functional use;Decreased  strength   Rehab Potential Excellent   Clinical Impairments Affecting Rehab Potential none   PT Frequency 2x / week   PT Duration 4 weeks   PT Treatment/Interventions Therapeutic exercise;Therapeutic activities;Manual lymph drainage;Patient/family education   PT Next Visit Plan Manual lymph drainage with  beginning self insturction.  Issue Celine Ahr DVD   Recommended Other Services ABC class   Consulted and Agree with Plan of Care Patient         Problem List Patient Active Problem List   Diagnosis Date Noted  . S/P mastectomy 03/07/2014  . Breast cancer 03/07/2014  . Breast cancer of upper-outer quadrant of left female breast 02/06/2014  . OSA (obstructive sleep apnea) 09/09/2010  . Restless leg syndrome 09/09/2010  . Depressive disorder, not elsewhere classified   . Asthma, cough variant   . Insomnia, unspecified   . Allergic rhinitis due to pollen    [place] K. Owens Shark, PT  03/28/2014, 4:32 PM  [place], [place], [address] Phone: [phone]   Fax:  [phone]

## 2014-03-28 NOTE — Patient Instructions (Signed)
[email or web address] Courses  Online Strength ABC Right of page lymphedema education session.

## 2014-03-31 MED ORDER — CEFAZOLIN SODIUM-DEXTROSE 2-3 GM-% IV SOLR
2.0000 g | INTRAVENOUS | Status: AC
  Administered 2014-04-01: 2 g via INTRAVENOUS
  Filled 2014-03-31: qty 50

## 2014-04-01 ENCOUNTER — Ambulatory Visit (HOSPITAL_COMMUNITY)
Admission: RE | Admit: 2014-04-01 | Discharge: 2014-04-01 | Disposition: A | Discharge status: Home / Self Care | Payer: 59 | Source: Ambulatory Visit | Attending: General Surgery | Admitting: General Surgery

## 2014-04-01 ENCOUNTER — Ambulatory Visit (HOSPITAL_COMMUNITY): Payer: 59 | Admitting: Anesthesiology

## 2014-04-01 ENCOUNTER — Encounter (HOSPITAL_COMMUNITY): Payer: Self-pay

## 2014-04-01 ENCOUNTER — Encounter (HOSPITAL_COMMUNITY)
Admission: RE | Disposition: A | Discharge status: Home / Self Care | Payer: Self-pay | Source: Ambulatory Visit | Attending: General Surgery

## 2014-04-01 ENCOUNTER — Ambulatory Visit (HOSPITAL_COMMUNITY): Payer: 59

## 2014-04-01 DIAGNOSIS — F329 Major depressive disorder, single episode, unspecified: Secondary | ICD-10-CM | POA: Diagnosis not present

## 2014-04-01 DIAGNOSIS — G4730 Sleep apnea, unspecified: Secondary | ICD-10-CM | POA: Diagnosis not present

## 2014-04-01 DIAGNOSIS — Z85820 Personal history of malignant melanoma of skin: Secondary | ICD-10-CM | POA: Insufficient documentation

## 2014-04-01 DIAGNOSIS — K219 Gastro-esophageal reflux disease without esophagitis: Secondary | ICD-10-CM | POA: Diagnosis not present

## 2014-04-01 DIAGNOSIS — F1921 Other psychoactive substance dependence, in remission: Secondary | ICD-10-CM | POA: Insufficient documentation

## 2014-04-01 DIAGNOSIS — C50912 Malignant neoplasm of unspecified site of left female breast: Secondary | ICD-10-CM | POA: Diagnosis not present

## 2014-04-01 DIAGNOSIS — Z87891 Personal history of nicotine dependence: Secondary | ICD-10-CM | POA: Diagnosis not present

## 2014-04-01 DIAGNOSIS — Z95828 Presence of other vascular implants and grafts: Secondary | ICD-10-CM

## 2014-04-01 DIAGNOSIS — C50919 Malignant neoplasm of unspecified site of unspecified female breast: Secondary | ICD-10-CM

## 2014-04-01 DIAGNOSIS — J45909 Unspecified asthma, uncomplicated: Secondary | ICD-10-CM | POA: Diagnosis not present

## 2014-04-01 HISTORY — PX: PORTACATH PLACEMENT: SHX2246

## 2014-04-01 SURGERY — INSERTION, TUNNELED CENTRAL VENOUS DEVICE, WITH PORT
Anesthesia: General | Site: Chest | Laterality: Right

## 2014-04-01 MED ORDER — OXYCODONE HCL 5 MG PO TABS
5.0000 mg | ORAL_TABLET | ORAL | Status: DC | PRN
  Administered 2014-04-01: 10 mg via ORAL
  Filled 2014-04-01: qty 2

## 2014-04-01 MED ORDER — BUPIVACAINE HCL (PF) 0.25 % IJ SOLN
INTRAMUSCULAR | Status: DC | PRN
  Administered 2014-04-01: 10 mL

## 2014-04-01 MED ORDER — MIDAZOLAM HCL 5 MG/5ML IJ SOLN
INTRAMUSCULAR | Status: DC | PRN
  Administered 2014-04-01: 2 mg via INTRAVENOUS

## 2014-04-01 MED ORDER — HEPARIN SOD (PORK) LOCK FLUSH 100 UNIT/ML IV SOLN
INTRAVENOUS | Status: AC
  Filled 2014-04-01: qty 5

## 2014-04-01 MED ORDER — LIDOCAINE HCL (CARDIAC) 20 MG/ML IV SOLN
INTRAVENOUS | Status: AC
  Filled 2014-04-01: qty 5

## 2014-04-01 MED ORDER — MIDAZOLAM HCL 2 MG/2ML IJ SOLN
INTRAMUSCULAR | Status: AC
  Filled 2014-04-01: qty 2

## 2014-04-01 MED ORDER — FENTANYL CITRATE 0.05 MG/ML IJ SOLN
INTRAMUSCULAR | Status: DC | PRN
  Administered 2014-04-01: 50 ug via INTRAVENOUS

## 2014-04-01 MED ORDER — OXYCODONE HCL 5 MG PO TABS
ORAL_TABLET | ORAL | Status: AC
  Filled 2014-04-01: qty 2

## 2014-04-01 MED ORDER — ONDANSETRON HCL 4 MG/2ML IJ SOLN
INTRAMUSCULAR | Status: DC | PRN
  Administered 2014-04-01: 4 mg via INTRAVENOUS

## 2014-04-01 MED ORDER — PROPOFOL 10 MG/ML IV BOLUS
INTRAVENOUS | Status: AC
  Filled 2014-04-01: qty 20

## 2014-04-01 MED ORDER — FENTANYL CITRATE 0.05 MG/ML IJ SOLN
INTRAMUSCULAR | Status: AC
  Filled 2014-04-01: qty 5

## 2014-04-01 MED ORDER — LIDOCAINE HCL (CARDIAC) 20 MG/ML IV SOLN
INTRAVENOUS | Status: DC | PRN
  Administered 2014-04-01: 40 mg via INTRAVENOUS

## 2014-04-01 MED ORDER — LACTATED RINGERS IV SOLN
INTRAVENOUS | Status: DC | PRN
  Administered 2014-04-01: 10:00:00 via INTRAVENOUS

## 2014-04-01 MED ORDER — PROPOFOL 10 MG/ML IV BOLUS
INTRAVENOUS | Status: DC | PRN
  Administered 2014-04-01: 130 mg via INTRAVENOUS

## 2014-04-01 MED ORDER — SODIUM CHLORIDE 0.9 % IR SOLN
Status: DC | PRN
  Administered 2014-04-01: 500 mL

## 2014-04-01 MED ORDER — DEXAMETHASONE SODIUM PHOSPHATE 4 MG/ML IJ SOLN
INTRAMUSCULAR | Status: DC | PRN
  Administered 2014-04-01: 4 mg via INTRAVENOUS

## 2014-04-01 MED ORDER — HEPARIN SOD (PORK) LOCK FLUSH 100 UNIT/ML IV SOLN
INTRAVENOUS | Status: DC | PRN
  Administered 2014-04-01: 300 [IU] via INTRAVENOUS

## 2014-04-01 MED ORDER — ONDANSETRON HCL 4 MG/2ML IJ SOLN
INTRAMUSCULAR | Status: AC
  Filled 2014-04-01: qty 2

## 2014-04-01 MED ORDER — LACTATED RINGERS IV SOLN
INTRAVENOUS | Status: DC
  Administered 2014-04-01: 10:00:00 via INTRAVENOUS

## 2014-04-01 MED ORDER — 0.9 % SODIUM CHLORIDE (POUR BTL) OPTIME
TOPICAL | Status: DC | PRN
  Administered 2014-04-01: 1000 mL

## 2014-04-01 MED ORDER — FENTANYL CITRATE 0.05 MG/ML IJ SOLN: 25.0000 ug | INTRAMUSCULAR | Status: DC | PRN

## 2014-04-01 MED ORDER — BUPIVACAINE HCL (PF) 0.25 % IJ SOLN
INTRAMUSCULAR | Status: AC
  Filled 2014-04-01: qty 30

## 2014-04-01 SURGICAL SUPPLY — 55 items
BAG DECANTER FOR FLEXI CONT (MISCELLANEOUS) ×2 IMPLANT
BLADE SURG 11 STRL SS (BLADE) ×2 IMPLANT
BLADE SURG 15 STRL LF DISP TIS (BLADE) ×1 IMPLANT
BLADE SURG 15 STRL SS (BLADE) ×2
CANISTER SUCTION 2500CC (MISCELLANEOUS) IMPLANT
CHLORAPREP W/TINT 26ML (MISCELLANEOUS) ×2 IMPLANT
COVER SURGICAL LIGHT HANDLE (MISCELLANEOUS) ×2 IMPLANT
CRADLE DONUT ADULT HEAD (MISCELLANEOUS) ×2 IMPLANT
DECANTER SPIKE VIAL GLASS SM (MISCELLANEOUS) ×2 IMPLANT
DRAPE C-ARM 42X72 X-RAY (DRAPES) ×2 IMPLANT
DRAPE LAPAROSCOPIC ABDOMINAL (DRAPES) ×2 IMPLANT
ELECT CAUTERY BLADE 6.4 (BLADE) ×2 IMPLANT
ELECT REM PT RETURN 9FT ADLT (ELECTROSURGICAL) ×2
ELECTRODE REM PT RTRN 9FT ADLT (ELECTROSURGICAL) ×1 IMPLANT
GAUZE SPONGE 4X4 16PLY XRAY LF (GAUZE/BANDAGES/DRESSINGS) ×2 IMPLANT
GLOVE BIO SURGEON STRL SZ7 (GLOVE) ×2 IMPLANT
GLOVE BIOGEL PI IND STRL 7.0 (GLOVE) IMPLANT
GLOVE BIOGEL PI IND STRL 7.5 (GLOVE) ×1 IMPLANT
GLOVE BIOGEL PI INDICATOR 7.0 (GLOVE) ×1
GLOVE BIOGEL PI INDICATOR 7.5 (GLOVE) ×1
GLOVE SURG SS PI 7.0 STRL IVOR (GLOVE) ×1 IMPLANT
GOWN STRL REUS W/ TWL LRG LVL3 (GOWN DISPOSABLE) ×2 IMPLANT
GOWN STRL REUS W/TWL LRG LVL3 (GOWN DISPOSABLE) ×4
INTRODUCER COOK 11FR (CATHETERS) IMPLANT
KIT BASIN OR (CUSTOM PROCEDURE TRAY) ×2 IMPLANT
KIT PORT POWER 8FR ISP CVUE (Catheter) ×1 IMPLANT
KIT PORT POWER 9.6FR MRI PREA (Catheter) IMPLANT
KIT PORT POWER ISP 8FR (Catheter) IMPLANT
KIT ROOM TURNOVER OR (KITS) ×2 IMPLANT
LIQUID BAND (GAUZE/BANDAGES/DRESSINGS) ×2 IMPLANT
NDL HYPO 25GX1X1/2 BEV (NEEDLE) ×1 IMPLANT
NEEDLE HYPO 25GX1X1/2 BEV (NEEDLE) ×2 IMPLANT
NS IRRIG 1000ML POUR BTL (IV SOLUTION) ×2 IMPLANT
PACK SURGICAL SETUP 50X90 (CUSTOM PROCEDURE TRAY) ×2 IMPLANT
PAD ARMBOARD 7.5X6 YLW CONV (MISCELLANEOUS) ×4 IMPLANT
PENCIL BUTTON HOLSTER BLD 10FT (ELECTRODE) ×2 IMPLANT
SET INTRODUCER 12FR PACEMAKER (SHEATH) IMPLANT
SET SHEATH INTRODUCER 10FR (MISCELLANEOUS) IMPLANT
SHEATH COOK PEEL AWAY SET 9F (SHEATH) IMPLANT
STAPLER VISISTAT 35W (STAPLE) ×1 IMPLANT
STRIP CLOSURE SKIN 1/2X4 (GAUZE/BANDAGES/DRESSINGS) ×1 IMPLANT
SUT MNCRL AB 4-0 PS2 18 (SUTURE) ×2 IMPLANT
SUT PROLENE 2 0 SH DA (SUTURE) ×2 IMPLANT
SUT SILK 2 0 (SUTURE) ×2
SUT SILK 2-0 18XBRD TIE 12 (SUTURE) IMPLANT
SUT VIC AB 3-0 SH 27 (SUTURE) ×2
SUT VIC AB 3-0 SH 27XBRD (SUTURE) ×1 IMPLANT
SYR 20ML ECCENTRIC (SYRINGE) ×4 IMPLANT
SYR 5ML LUER SLIP (SYRINGE) ×2 IMPLANT
SYR CONTROL 10ML LL (SYRINGE) IMPLANT
TOWEL OR 17X24 6PK STRL BLUE (TOWEL DISPOSABLE) ×2 IMPLANT
TOWEL OR 17X26 10 PK STRL BLUE (TOWEL DISPOSABLE) ×2 IMPLANT
TUBE CONNECTING 12X1/4 (SUCTIONS) IMPLANT
WATER STERILE IRR 1000ML POUR (IV SOLUTION) IMPLANT
YANKAUER SUCT BULB TIP NO VENT (SUCTIONS) IMPLANT

## 2014-04-01 NOTE — Transfer of Care (Signed)
Immediate Anesthesia Transfer of Care Note  Patient: Theresa Huff  Procedure(s) Performed: Procedure(s): INSERTION PORT-A-CATH (Right)  Patient Location: PACU  Anesthesia Type:General  Level of Consciousness: awake, alert  and oriented  Airway & Oxygen Therapy: Patient Spontanous Breathing and Patient connected to nasal cannula oxygen  Post-op Assessment: Report given to RN and Post -op Vital signs reviewed and stable  Post vital signs: Reviewed and stable  Last Vitals:  Filed Vitals:   04/01/14 0827  BP: 117/73  Pulse: 70  Temp: 36.5 C  Resp: 20    Complications: No apparent anesthesia complications

## 2014-04-01 NOTE — Anesthesia Postprocedure Evaluation (Signed)
  Anesthesia Post-op Note  Patient: Theresa Huff  Procedure(s) Performed: Procedure(s): INSERTION PORT-A-CATH (Right)  Patient Location: PACU  Anesthesia Type:General  Level of Consciousness: awake and alert   Airway and Oxygen Therapy: Patient Spontanous Breathing  Post-op Pain: mild  Post-op Assessment: Post-op Vital signs reviewed, Patient's Cardiovascular Status Stable and Respiratory Function Stable  Post-op Vital Signs: Reviewed  Filed Vitals:   04/01/14 1215  BP:   Pulse: 62  Temp:   Resp: 13    Complications: No apparent anesthesia complications

## 2014-04-01 NOTE — Op Note (Signed)
Preoperative diagnosis: Breast cancer, needs venous access for chemotherapy Postoperative diagnosis: same as above Procedure: Right subclavian PowerPort insertion Surgeon: Dr. Serita Grammes Anesthesia: general with lma Estimated blood loss: Minimal Drains: None Counts gauge: None Sponge count was correct Disposition to recovery stable  Indications: This a 59 yof who has undergone left mastectomy and sentinel node biopsy for node positive left breast cancer. She is due to begin chemotherapy soon. We discussed port placement  Procedure: After informed consent was obtained the patient was taken to the operating room. She was given cefazolin. SCDs were placed. She was placed under general anesthesia. Arms were tucked and appropriately padded. She was then prepped and draped in a standard sterile surgical fashion. A surgical timeout was then performed.  I infiltrated Marcaine throughout the right chest wall as well as onto the clavicle. I accessed her subclavian vein easily. The wire was placed. I then made a pocket overlying the pectoralis fascia. I then tunneled the line between the 2 sites. I then dilated the tract. I then inserted the peel-away sheath and dilator assembly and watched this go into position under fluoroscopy. I removed the wire assembly. I then placed the line. The peel-away sheath was removed. The line was pulled back to be in the cava. The port was then attached. This was secured with 2-0 Prolene in 2 places. I then flushed this and aspirated blood. I then closed this with 3-0 Vicryl and 4-0 Monocryl.This aspirated blood and I placed heparin in the port. I did leave this accessed for chemotherapy tomorrow. Dermabond was placed over the wound. She tolerated this well as expected and transferred to recovery stable.

## 2014-04-01 NOTE — Anesthesia Preprocedure Evaluation (Addendum)
Anesthesia Evaluation  Patient identified by MRN, date of birth, ID band Patient awake    Reviewed: Allergy & Precautions, H&P , NPO status , Patient's Chart, lab work & pertinent test results  Airway Mallampati: II  TM Distance: >3 FB Neck ROM: Full    Dental no notable dental hx. (+) Teeth Intact, Dental Advisory Given   Pulmonary asthma , former smoker,  breath sounds clear to auscultation  Pulmonary exam normal       Cardiovascular negative cardio ROS  Rhythm:Regular Rate:Normal     Neuro/Psych Depression negative neurological ROS     GI/Hepatic Neg liver ROS, GERD-  Medicated and Controlled,  Endo/Other  negative endocrine ROS  Renal/GU negative Renal ROS  negative genitourinary   Musculoskeletal   Abdominal   Peds  Hematology negative hematology ROS (+)   Anesthesia Other Findings   Reproductive/Obstetrics negative OB ROS                           Anesthesia Physical Anesthesia Plan  ASA: II  Anesthesia Plan: General   Post-op Pain Management:    Induction: Intravenous  Airway Management Planned: LMA  Additional Equipment:   Intra-op Plan:   Post-operative Plan: Extubation in OR  Informed Consent: I have reviewed the patients History and Physical, chart, labs and discussed the procedure including the risks, benefits and alternatives for the proposed anesthesia with the patient or authorized representative who has indicated his/her understanding and acceptance.   Dental advisory given  Plan Discussed with: CRNA  Anesthesia Plan Comments:        Anesthesia Quick Evaluation

## 2014-04-01 NOTE — Discharge Instructions (Signed)
PORT-A-CATH: POST OP INSTRUCTIONS  Always review your discharge instruction sheet given to you by the facility where your surgery was performed.   1. A prescription for pain medication may be given to you upon discharge. Take your pain medication as prescribed, if needed. If narcotic pain medicine is not needed, then you make take acetaminophen (Tylenol) or ibuprofen (Advil) as needed.  2. Take your usually prescribed medications unless otherwise directed. 3. If you need a refill on your pain medication, please contact our office. All narcotic pain medicine now requires a paper prescription.  Phoned in and fax refills are no longer allowed by law.  Prescriptions will not be filled after 5 pm or on weekends.  4. You should follow a light diet for the remainder of the day after your procedure. 5. Most patients will experience some mild swelling and/or bruising in the area of the incision. It may take several days to resolve. 6. It is common to experience some constipation if taking pain medication after surgery. Increasing fluid intake and taking a stool softener (such as Colace) will usually help or prevent this problem from occurring. A mild laxative (Milk of Magnesia or Miralax) should be taken according to package directions if there are no bowel movements after 48 hours.  7. Unless discharge instructions indicate otherwise, you may remove your bandages 48 hours after surgery, and you may shower at that time. You may have steri-strips (small white skin tapes) in place directly over the incision.  These strips should be left on the skin for 7-10 days.  If your surgeon used Dermabond (skin glue) on the incision, you may shower in 24 hours.  The glue will flake off over the next 2-3 weeks.  8. If your port is left accessed at the end of surgery (needle left in port), the dressing cannot get wet and should only by changed by a healthcare professional. When the port is no longer accessed (when the  needle has been removed), follow step 7.   9. ACTIVITIES:  Limit activity involving your arms for the next 72 hours. Do no strenuous exercise or activity for 1 week. You may drive when you are no longer taking prescription pain medication, you can comfortably wear a seatbelt, and you can maneuver your car. 10.You may need to see your doctor in the office for a follow-up appointment.  Please       check with your doctor.  11.When you receive a new Port-a-Cath, you will get a product guide and        ID card.  Please keep them in case you need them.  WHEN TO CALL YOUR DOCTOR [phone]): 1. Fever over 101.0 2. Chills 3. Continued bleeding from incision 4. Increased redness and tenderness at the site 5. Shortness of breath, difficulty breathing   The clinic staff is available to answer your questions during regular business hours. Please dont hesitate to call and ask to speak to one of the nurses or medical assistants for clinical concerns. If you have a medical emergency, go to the nearest emergency room or call 911.  A surgeon from [facility] is always on call at the hospital.     For further information, please visit [email or web address]        General Anesthesia, Adult, Care After  Refer to this sheet in the next few weeks. These instructions provide you with information on caring for yourself after your procedure. Your health care provider may also give you  more specific instructions. Your treatment has been planned according to current medical practices, but problems sometimes occur. Call your health care provider if you have any problems or questions after your procedure.  WHAT TO EXPECT AFTER THE PROCEDURE  After the procedure, it is typical to experience:  Sleepiness.  Nausea and vomiting. HOME CARE INSTRUCTIONS  For the first 24 hours after general anesthesia:  Have a responsible person with you.  Do not drive a car. If you are alone, do not take  public transportation.  Do not drink alcohol.  Do not take medicine that has not been prescribed by your health care provider.  Do not sign important papers or make important decisions.  You may resume a normal diet and activities as directed by your health care provider.  Change bandages (dressings) as directed.  If you have questions or problems that seem related to general anesthesia, call the hospital and ask for the anesthetist or anesthesiologist on call. SEEK MEDICAL CARE IF:  You have nausea and vomiting that continue the day after anesthesia.  You develop a rash. SEEK IMMEDIATE MEDICAL CARE IF:  You have difficulty breathing.  You have chest pain.  You have any allergic problems. Document Released: 04/19/2000 Document Revised: 09/13/2012 Document Reviewed: 07/27/2012  [facility] Patient Information 2014 [place], [place].   Tunneled Catheter Insertion, Care After Refer to this sheet in the next few weeks. These instructions provide you with information on caring for yourself after your procedure. Your caregiver may also give you more specific instructions. Your treatment has been planned according to current medical practices, but problems sometimes occur. Call your caregiver if you have any problems or questions after your procedure.  HOME CARE INSTRUCTIONS  Rest at home the day of the procedure. You will likely be able to return to normal activities the following day.  Follow your caregiver's specific instructions for the type of device that you have.  Only take over-the-counter or prescription medicines as directed by your caregiver.  Keep the insertion site of the catheter clean and dry at all times.  Do notlet air enter the catheter.  Never open the cap at the catheter tip.  Always make sure there is no air in the syringe or in the tubing for infusions.   Do notlift anything heavy.  Do not drive until your caregiver approves.  Do not shower or bathe until your  caregiver approves. When you shower or bathe, place a piece of plastic wrap over the catheter site. Do not allow the catheter site or the dressing to get wet. If taking a bath, do not allow the catheter to get submerged in the water.  ONLY shower if the Doctor says it is allowed. If the catheter was inserted through an arm vein:  Avoid wearing tight clothes or jewelry on the arm that has the catheter.   Do not sleep with your head on the arm that has the catheter.   Do not allow use of a blood pressure cuff on the arm that has the catheter.   Do not let anyone draw blood from the arm that has the catheter, except through the catheter itself. SEEK MEDICAL CARE IF:  You have bleeding at the insertion site of the catheter.   You feel weak or nauseous.   Your catheter is not working properly.   You have redness, pain, swelling, and warmth at the insertion site.   You notice fluid draining from the insertion site.  SEEK IMMEDIATE MEDICAL CARE IF:  Your catheter breaks or has a hole in it.   Your catheter comes loose or gets pulled completely out. If this happens, hold firm pressure over the area with your hand or a clean cloth.   You have a fever.  You have chills.   Your catheter becomes totally blocked.   You have swelling in your arm, shoulder, neck, or face.   You have bleeding from the insertion site that does not stop.   You develop chest pain or have trouble breathing.   You feel dizzy or faint.  MAKE SURE YOU:  Understand these instructions.  Will watch your condition.  Will get help right away if you are not doing well or get worse. Document Released: 12/29/2011 Document Revised: 09/13/2012 Document Reviewed: 12/29/2011 [facility] Patient Information 2015 [place], [place]. This information is not intended to replace advice given to you by your health care provider. Make sure you discuss any questions you have with your health care provider.

## 2014-04-01 NOTE — H&P (View-Only) (Signed)
64 yof who palpated a left breast mass Dec 26. Has not changed much since then.She had noticed in November that it looked like her left nipple was inverting and turning outwards. She denies discharge. She has no FH. She underwent mm that showed abnormality. US showed three separate masses. the largest was the 3.7 mass at 2 oclock. There are two other about 1 cm masses at 3 and 1 oclock. Biopsy of the two are morphologically identical and show grade I IDC that is er pos, pr pos, her 2 neg and Ki is 19% The third lesion has some lobular features. I have asked pathology to do separate prognostic panel on this one and will await those results. MRI shows a 5.6x2.7x3 cm mass on the left, right is negative no abnormal nodes seen. She is here today to discuss options.   Other Problems Francee Nodal, RN; 02/13/2014 8:43 AM) Back Pain Breast Cancer Depression Gastroesophageal Reflux Disease Melanoma Sleep Apnea  Past Surgical History Francee Nodal, RN; 02/13/2014 8:43 AM) Breast Biopsy Left. Oral Surgery Tonsillectomy  Diagnostic Studies History Francee Nodal, RN; 02/13/2014 8:43 AM) Colonoscopy 5-10 years ago Mammogram within last year Pap Smear 1-5 years ago  Social History Francee Nodal, RN; 02/13/2014 8:43 AM) Alcohol use Moderate alcohol use. Caffeine use Carbonated beverages. Illicit drug use Remotely quit drug use. Tobacco use Former smoker.  Family History Francee Nodal, RN; 02/13/2014 8:43 AM) Alcohol Abuse Father. Arthritis Mother. Depression Mother. Heart Disease Mother. Hypertension Father, Mother. Melanoma Brother. Migraine Headache Daughter, Family Members In General. Respiratory Condition Father. Thyroid problems Daughter, Family Members In General.  Pregnancy / Birth History Francee Nodal, RN; 02/13/2014 8:43 AM) Age at menarche 64 years. Age of menopause 62-50 Contraceptive History Intrauterine device, Oral contraceptives. Gravida  3 Irregular periods Maternal age 20-20 Para 2  Review of Systems Francee Nodal RN; 02/13/2014 8:43 AM) General Present- Appetite Loss and Weight Loss. Not Present- Chills, Fatigue, Fever, Night Sweats and Weight Gain. Skin Present- Dryness. Not Present- Change in Wart/Mole, Hives, Jaundice, New Lesions, Non-Healing Wounds, Rash and Ulcer. HEENT Present- Seasonal Allergies and Wears glasses/contact lenses. Not Present- Earache, Hearing Loss, Hoarseness, Nose Bleed, Oral Ulcers, Ringing in the Ears, Sinus Pain, Sore Throat, Visual Disturbances and Yellow Eyes. Respiratory Present- Snoring. Not Present- Bloody sputum, Chronic Cough, Difficulty Breathing and Wheezing. Breast Present- Breast Mass, Breast Pain and Skin Changes. Not Present- Nipple Discharge. Cardiovascular Present- Leg Cramps. Not Present- Chest Pain, Difficulty Breathing Lying Down, Palpitations, Rapid Heart Rate, Shortness of Breath and Swelling of Extremities. Gastrointestinal Present- Change in Bowel Habits, Constipation and Gets full quickly at meals. Not Present- Abdominal Pain, Bloating, Bloody Stool, Chronic diarrhea, Difficulty Swallowing, Excessive gas, Hemorrhoids, Indigestion, Nausea, Rectal Pain and Vomiting. Female Genitourinary Not Present- Frequency, Nocturia, Painful Urination, Pelvic Pain and Urgency. Musculoskeletal Not Present- Back Pain, Joint Pain, Joint Stiffness, Muscle Pain, Muscle Weakness and Swelling of Extremities. Neurological Present- Tremor. Not Present- Decreased Memory, Fainting, Headaches, Numbness, Seizures, Tingling, Trouble walking and Weakness. Psychiatric Present- Depression. Not Present- Anxiety, Bipolar, Change in Sleep Pattern, Fearful and Frequent crying. Endocrine Not Present- Cold Intolerance, Excessive Hunger, Hair Changes, Heat Intolerance, Hot flashes and New Diabetes. Hematology Present- Easy Bruising. Not Present- Excessive bleeding, Gland problems, HIV and Persistent  Infections.   Physical Exam Rolm Bookbinder MD; 02/13/2014 8:20 PM) General Mental Status-Alert. Orientation-Oriented X3.  Chest and Lung Exam Chest and lung exam reveals -on auscultation, normal breath sounds, no adventitious sounds and normal vocal resonance.  Breast Nipples-No Discharge. Breast -  Right-Normal. Note: left nipple is turned laterally and partially inverted left breast has small 1 cm mass palpable and adherent to areolar skin at lateral border with larger more amorphous area extending laterally measuring about 5 cm   Cardiovascular Cardiovascular examination reveals -normal heart sounds, regular rate and rhythm with no murmurs.  Lymphatic Head & Neck  General Head & Neck Lymphatics: Bilateral - Description - Normal. Axillary  General Axillary Region: Bilateral - Description - Normal. Note: no [place] adenopathy     Assessment & Plan Rolm Bookbinder MD; 02/13/2014 8:22 PM) STAGE III CARCINOMA OF BREAST, LEFT (174.9  C50.912) Story: Left total mastectomy with left axillary sentinel node biopsy

## 2014-04-01 NOTE — Anesthesia Procedure Notes (Signed)
Procedure Name: LMA Insertion Date/Time: 04/01/2014 10:27 AM Performed by: Ollen Bowl Pre-anesthesia Checklist: Patient identified, Emergency Drugs available, Suction available, Patient being monitored and Timeout performed Patient Re-evaluated:Patient Re-evaluated prior to inductionOxygen Delivery Method: Circle system utilized and Simple face mask Preoxygenation: Pre-oxygenation with 100% oxygen Intubation Type: IV induction LMA: LMA inserted LMA Size: 4.0 Number of attempts: 1 Airway Equipment and Method: Patient positioned with wedge pillow Placement Confirmation: positive ETCO2 and breath sounds checked- equal and bilateral Tube secured with: Tape Dental Injury: Teeth and Oropharynx as per pre-operative assessment

## 2014-04-01 NOTE — Interval H&P Note (Signed)
History and Physical Interval Note:  04/01/2014 10:07 AM  Theresa Huff  has presented today for surgery, with the diagnosis of BREAST CANCER  The various methods of treatment have been discussed with the patient and family. After consideration of risks, benefits and other options for treatment, the patient has consented to  Procedure(s): INSERTION PORT-A-CATH (N/A) as a surgical intervention .  The patient's history has been reviewed, patient examined, no change in status, stable for surgery.  I have reviewed the patient's chart and labs.  Questions were answered to the patient's satisfaction.     Bandon Sherwin

## 2014-04-02 ENCOUNTER — Encounter (HOSPITAL_COMMUNITY): Payer: Self-pay | Admitting: General Surgery

## 2014-04-03 ENCOUNTER — Ambulatory Visit (HOSPITAL_BASED_OUTPATIENT_CLINIC_OR_DEPARTMENT_OTHER): Payer: 59 | Admitting: Hematology and Oncology

## 2014-04-03 ENCOUNTER — Encounter: Payer: Self-pay | Admitting: *Deleted

## 2014-04-03 ENCOUNTER — Ambulatory Visit (HOSPITAL_BASED_OUTPATIENT_CLINIC_OR_DEPARTMENT_OTHER): Payer: 59

## 2014-04-03 ENCOUNTER — Telehealth: Payer: Self-pay | Admitting: Hematology and Oncology

## 2014-04-03 ENCOUNTER — Other Ambulatory Visit (HOSPITAL_BASED_OUTPATIENT_CLINIC_OR_DEPARTMENT_OTHER): Payer: 59

## 2014-04-03 VITALS — BP 111/71 | HR 77 | Temp 98.3°F | Resp 20 | Ht 65.0 in | Wt 153.0 lb

## 2014-04-03 DIAGNOSIS — Z5189 Encounter for other specified aftercare: Secondary | ICD-10-CM

## 2014-04-03 DIAGNOSIS — C50412 Malignant neoplasm of upper-outer quadrant of left female breast: Secondary | ICD-10-CM

## 2014-04-03 DIAGNOSIS — C773 Secondary and unspecified malignant neoplasm of axilla and upper limb lymph nodes: Secondary | ICD-10-CM

## 2014-04-03 DIAGNOSIS — Z5111 Encounter for antineoplastic chemotherapy: Secondary | ICD-10-CM

## 2014-04-03 DIAGNOSIS — Z170 Estrogen receptor positive status [ER+]: Secondary | ICD-10-CM

## 2014-04-03 LAB — COMPREHENSIVE METABOLIC PANEL (CC13)
ALK PHOS: 60 U/L (ref 40–150)
ALT: 7 U/L (ref 0–55)
AST: 11 U/L (ref 5–34)
Albumin: 3.6 g/dL (ref 3.5–5.0)
Anion Gap: 10 mEq/L (ref 3–11)
BILIRUBIN TOTAL: 0.37 mg/dL (ref 0.20–1.20)
BUN: 15.2 mg/dL (ref 7.0–26.0)
CO2: 25 mEq/L (ref 22–29)
CREATININE: 0.8 mg/dL (ref 0.6–1.1)
Calcium: 9.2 mg/dL (ref 8.4–10.4)
Chloride: 107 mEq/L (ref 98–109)
EGFR: 77 mL/min/{1.73_m2} — ABNORMAL LOW (ref >90)
Glucose: 70 mg/dl (ref 70–140)
POTASSIUM: 3.9 meq/L (ref 3.5–5.1)
Sodium: 142 mEq/L (ref 136–145)
Total Protein: 6.3 g/dL — ABNORMAL LOW (ref 6.4–8.3)

## 2014-04-03 LAB — CBC WITH DIFFERENTIAL/PLATELET
BASO%: 0.8 % (ref 0.0–2.0)
Basophils Absolute: 0.1 10*3/uL (ref 0.0–0.1)
EOS ABS: 0.2 10*3/uL (ref 0.0–0.5)
EOS%: 2.5 % (ref 0.0–7.0)
HEMATOCRIT: 40.2 % (ref 34.8–46.6)
HEMOGLOBIN: 12.8 g/dL (ref 11.6–15.9)
LYMPH%: 46.2 % (ref 14.0–49.7)
MCH: 29.3 pg (ref 25.1–34.0)
MCHC: 31.8 g/dL (ref 31.5–36.0)
MCV: 92.1 fL (ref 79.5–101.0)
MONO#: 0.6 10*3/uL (ref 0.1–0.9)
MONO%: 7.2 % (ref 0.0–14.0)
NEUT#: 3.8 10*3/uL (ref 1.5–6.5)
NEUT%: 43.3 % (ref 38.4–76.8)
PLATELETS: 262 10*3/uL (ref 145–400)
RBC: 4.37 10*6/uL (ref 3.70–5.45)
RDW: 13.9 % (ref 11.2–14.5)
WBC: 8.7 10*3/uL (ref 3.9–10.3)
lymph#: 4 10*3/uL — ABNORMAL HIGH (ref 0.9–3.3)

## 2014-04-03 MED ORDER — HEPARIN SOD (PORK) LOCK FLUSH 100 UNIT/ML IV SOLN
500.0000 [IU] | Freq: Once | INTRAVENOUS | Status: AC | PRN
  Administered 2014-04-03: 500 [IU]
  Filled 2014-04-03: qty 5

## 2014-04-03 MED ORDER — SODIUM CHLORIDE 0.9 % IV SOLN
Freq: Once | INTRAVENOUS | Status: AC
  Administered 2014-04-03: 09:00:00 via INTRAVENOUS

## 2014-04-03 MED ORDER — PALONOSETRON HCL INJECTION 0.25 MG/5ML
0.2500 mg | Freq: Once | INTRAVENOUS | Status: AC
  Administered 2014-04-03: 0.25 mg via INTRAVENOUS

## 2014-04-03 MED ORDER — SODIUM CHLORIDE 0.9 % IV SOLN
Freq: Once | INTRAVENOUS | Status: AC
  Administered 2014-04-03: 09:00:00 via INTRAVENOUS
  Filled 2014-04-03: qty 5

## 2014-04-03 MED ORDER — SODIUM CHLORIDE 0.9 % IV SOLN
600.0000 mg/m2 | Freq: Once | INTRAVENOUS | Status: AC
  Administered 2014-04-03: 1060 mg via INTRAVENOUS
  Filled 2014-04-03: qty 53

## 2014-04-03 MED ORDER — SODIUM CHLORIDE 0.9 % IJ SOLN
10.0000 mL | INTRAMUSCULAR | Status: DC | PRN
  Administered 2014-04-03: 10 mL
  Filled 2014-04-03: qty 10

## 2014-04-03 MED ORDER — PEGFILGRASTIM 6 MG/0.6ML ~~LOC~~ PSKT
6.0000 mg | PREFILLED_SYRINGE | Freq: Once | SUBCUTANEOUS | Status: AC
  Administered 2014-04-03: 6 mg via SUBCUTANEOUS
  Filled 2014-04-03: qty 0.6

## 2014-04-03 MED ORDER — DOXORUBICIN HCL CHEMO IV INJECTION 2 MG/ML
60.0000 mg/m2 | Freq: Once | INTRAVENOUS | Status: AC
  Administered 2014-04-03: 106 mg via INTRAVENOUS
  Filled 2014-04-03: qty 53

## 2014-04-03 MED ORDER — PALONOSETRON HCL INJECTION 0.25 MG/5ML
INTRAVENOUS | Status: AC
  Filled 2014-04-03: qty 5

## 2014-04-03 NOTE — Progress Notes (Signed)
Spoke with patient today at her 1st chemo.  She is doing well.  She has cut her hair short. Offered her wig prescription but she states she thinks it will be too hot and she is going to go with a scarf.  Informed her to let me know if she changes her mind.  Encouraged her to call with any needs or concerns. Patient verbalized understanding.

## 2014-04-03 NOTE — Progress Notes (Signed)
Patient Care Team: Antony Contras, MD as PCP - General (Family Medicine) Elsie Stain, MD (Pulmonary Disease) Rolm Bookbinder, MD as Consulting Physician (General Surgery) Nicholas Lose, MD as Consulting Physician (Hematology and Oncology) [name], MD as Consulting Physician (Radiation Oncology) Rockwell Germany, RN as Registered Nurse Mauro Kaufmann, RN as Registered Nurse Holley Bouche, NP as Nurse Practitioner (Nurse Practitioner)  DIAGNOSIS: Breast cancer of upper-outer quadrant of left female breast   Staging form: Breast, AJCC 7th Edition     Clinical stage from 02/13/2014: Stage IIB (T3, N0, M0) - Unsigned       Staging comments: Staged at breast conference on 1.20.16      Pathologic stage from 03/11/2014: Stage IIB (T2, N1a, cM0) - Signed by Seward Grater, MD on 03/19/2014       Staging comments: Staged on final mastectomy specimen by Dr. Avis Epley    SUMMARY OF ONCOLOGIC HISTORY:   Breast cancer of upper-outer quadrant of left female breast   02/04/2014 Initial Diagnosis left breast: Invasive ductal carcinoma with DCIS ER 100%, PR 100%, HER-2 negative, Ki-67 ranged from 19-26%; third biopsy showed invasive mammary cancer with lobular features and intraductal papilloma, ER/PR positive HER-2 negative   02/11/2014 Breast MRI left breast: 3 masses that are confluent measuring 5.6 x 2.7 x 3 cm extends to the left aspect of area along the penis to involve skin of the area no lymph nodes Theresa right breast negative   03/07/2014 Surgery Left breast invasive ductal carcinoma grade 1; 4.8 cm with low-grade DCIS; margins negative 1/4 lymph nodes positive, ER 100% PR 100% HER-2 negative ratio 1.18 Ki-67 26% and 19% T2 N1 stage IIB   04/03/2014 -  Chemotherapy Adjuvant chemotherapy with dose dense Adriamycin and Cytoxan followed by weekly Abraxane 12    CHIEF COMPLIANT: Cycle 1 day 1 of dose dense Adriamycin and Cytoxan  INTERVAL HISTORY: Theresa Huff is a 64 year old lady with  above-mentioned history of left breast cancer who had lumpectomy and had positive lymph node and she is now here to start cycle 1 of chemotherapy with dose dense Adriamycin and Cytoxan. She filled all of her medications and is here to get started with the treatment.  REVIEW OF SYSTEMS:   Constitutional: Denies fevers, chills or abnormal weight loss Eyes: Denies blurriness of vision Ears, nose, mouth, throat, and face: Denies mucositis or sore throat Respiratory: Denies cough, dyspnea or wheezes Cardiovascular: Denies palpitation, chest discomfort or lower extremity swelling Gastrointestinal:  Denies nausea, heartburn or change in bowel habits Skin: Denies abnormal skin rashes Lymphatics: Denies new lymphadenopathy or easy bruising Neurological:Denies numbness, tingling or new weaknesses Behavioral/Psych: Mood is stable, no new changes  Breast:  denies any pain or lumps or nodules in either breasts All other systems were reviewed with the patient and are negative.  I have reviewed the past medical history, past surgical history, social history and family history with the patient and they are unchanged from previous note.  ALLERGIES:  is allergic to codeine; iodine; primidone; sulfa antibiotics; and tape.  MEDICATIONS:  Current Outpatient Prescriptions  Medication Sig Dispense Refill  . calcium carbonate (OS-CAL) 600 MG TABS tablet Take 600 mg by mouth daily.    . cetirizine (ZYRTEC) 10 MG tablet Take 10 mg by mouth daily.      . Cholecalciferol (VITAMIN D PO) Take 2,000 Units by mouth daily.    Marland Kitchen dexamethasone (DECADRON) 4 MG tablet Take 2 tablets by mouth once a day on the  day after chemotherapy and then take 2 tablets two times a day for 2 days. Take with food. 30 tablet 1  . DULoxetine (CYMBALTA) 30 MG capsule     . DULoxetine (CYMBALTA) 60 MG capsule Take 30 mg by mouth daily.     Marland Kitchen esomeprazole (NEXIUM) 20 MG packet Take 20 mg by mouth daily before breakfast.    . Eszopiclone  (ESZOPICLONE) 3 MG TABS Take 3 mg by mouth at bedtime. Take immediately before bedtime    . lidocaine-prilocaine (EMLA) cream Apply to affected area once 30 g 3  . LORazepam (ATIVAN) 0.5 MG tablet Take 1 tablet (0.5 mg total) by mouth every 6 (six) hours as needed (Nausea or vomiting). 30 tablet 0  . mometasone (NASONEX) 50 MCG/ACT nasal spray Place 2 sprays into the nose daily.      . ondansetron (ZOFRAN) 4 MG tablet     . ondansetron (ZOFRAN) 8 MG tablet Take 1 tablet (8 mg total) by mouth 2 (two) times daily as needed. Start on the third day after chemotherapy. 30 tablet 1  . oxyCODONE (OXY IR/ROXICODONE) 5 MG immediate release tablet Take 1-2 tablets (5-10 mg total) by mouth every 4 (four) hours as needed for moderate pain. 30 tablet 0  . prochlorperazine (COMPAZINE) 10 MG tablet Take 1 tablet (10 mg total) by mouth every 6 (six) hours as needed (Nausea or vomiting). 30 tablet 1  . Vitamin D, Ergocalciferol, (DRISDOL) 50000 UNITS CAPS capsule Take 50,000 Units by mouth every 7 (seven) days.     No current facility-administered medications for this visit.    PHYSICAL EXAMINATION: ECOG PERFORMANCE STATUS: 0 - Asymptomatic  Filed Vitals:   04/03/14 0827  BP: 111/71  Pulse: 77  Temp: 98.3 F (36.8 C)  Resp: 20   Filed Weights   04/03/14 0827  Weight: 153 lb (69.4 kg)    GENERAL:alert, no distress and comfortable SKIN: skin color, texture, turgor are normal, no rashes or significant lesions EYES: normal, Conjunctiva are pink and non-injected, sclera clear OROPHARYNX:no exudate, no erythema and lips, buccal mucosa, and tongue normal  NECK: supple, thyroid normal size, non-tender, without nodularity LYMPH:  no palpable lymphadenopathy in the cervical, axillary or inguinal LUNGS: clear to auscultation and percussion with normal breathing effort HEART: regular rate & rhythm and no murmurs and no lower extremity edema ABDOMEN:abdomen soft, non-tender and normal bowel  sounds Musculoskeletal:no cyanosis of digits and no clubbing  NEURO: alert & oriented x 3 with fluent speech, no focal motor/sensory deficits  LABORATORY DATA:  I have reviewed the data as listed   Chemistry      Component Value Date/Time   NA 141 03/27/2014 1439   NA 142 02/13/2014 0816   K 4.0 03/27/2014 1439   K 4.4 02/13/2014 0816   CL 108 03/27/2014 1439   CO2 28 03/27/2014 1439   CO2 28 02/13/2014 0816   BUN 9 03/27/2014 1439   BUN 14.1 02/13/2014 0816   CREATININE 0.78 03/27/2014 1439   CREATININE 0.8 02/13/2014 0816      Component Value Date/Time   CALCIUM 9.5 03/27/2014 1439   CALCIUM 9.3 02/13/2014 0816   ALKPHOS 73 02/13/2014 0816   ALKPHOS 76 09/14/2010 1708   AST 15 02/13/2014 0816   AST 18 09/14/2010 1708   ALT 13 02/13/2014 0816   ALT 21 09/14/2010 1708   BILITOT 0.54 02/13/2014 0816   BILITOT 0.4 09/14/2010 1708       Lab Results  Component Value Date  WBC 8.7 04/03/2014   HGB 12.8 04/03/2014   HCT 40.2 04/03/2014   MCV 92.1 04/03/2014   PLT 262 04/03/2014   NEUTROABS 3.8 04/03/2014     RADIOGRAPHIC STUDIES: I have personally reviewed the radiology reports and agreed with their findings. Dg Chest Port 1 View  04/01/2014   CLINICAL DATA:  Post Port-A-Cath placement.  EXAM: PORTABLE CHEST - 1 VIEW  COMPARISON:  06/06/2010  FINDINGS: Right Port-A-Cath is in place with the tip in the SVC. No pneumothorax.  There is bibasilar atelectasis. No effusions. Heart is borderline in size.  IMPRESSION: Right Port-A-Cath placement with the tip in the SVC. No pneumothorax.  Bibasilar atelectasis.   Electronically Signed   By: Rolm Baptise M.D.   On: 04/01/2014 11:41   Dg Fluoro Guide Cv Line-no Report  04/01/2014   CLINICAL DATA:    FLOURO GUIDE CV LINE  Fluoroscopy was utilized by the requesting physician.  No radiographic  interpretation.      ASSESSMENT & PLAN:  Breast cancer of upper-outer quadrant of left female breast Left breast invasive ductal  carcinoma grade 1; 4.8 cm with low-grade DCIS; margins negative 1/4 lymph nodes positive, ER 100% PR 100% HER-2 negative ratio 1.18 Ki-67 26% and 19% T2 N1 stage IIB  Treatment plan:  1. Systemic chemotherapy with dose dense Adriamycin and Cytoxan 4 followed by Abraxane weekly 12 2. Followed by adjuvant radiation 3. Followed by adjuvant antiestrogen therapy with anastrozole for 5 years  Current treatment: Cycle 1 day 1 dose dense Adriamycin and Cytoxan  Chemotherapy monitoring 1. Echocardiogram 03/26/2014 EF 55-60% 2. Port has been placed 3. Consent has been obtained 4. Chemotherapy class completed  Return to clinic in 1 week for toxicity check       Orders Placed This Encounter  Procedures  . CBC with Differential    Standing Status: Future     Number of Occurrences:      Standing Expiration Date: 04/03/2015  . Comprehensive metabolic panel (Cmet) - [facility]    Standing Status: Future     Number of Occurrences:      Standing Expiration Date: 04/03/2015   The patient has a good understanding of the overall plan. she agrees with it. She will call with any problems that may develop before her next visit here.   Rulon Eisenmenger, MD

## 2014-04-03 NOTE — Patient Instructions (Signed)
[facility] Discharge Instructions for Patients Receiving Chemotherapy  Today you received the following chemotherapy agents adriamycin/cytoxan  To help prevent nausea and vomiting after your treatment, we encourage you to take your nausea medication as directed   If you develop nausea and vomiting that is not controlled by your nausea medication, call the clinic.   BELOW ARE SYMPTOMS THAT SHOULD BE REPORTED IMMEDIATELY:  *FEVER GREATER THAN 100.5 F  *CHILLS WITH OR WITHOUT FEVER  NAUSEA AND VOMITING THAT IS NOT CONTROLLED WITH YOUR NAUSEA MEDICATION  *UNUSUAL SHORTNESS OF BREATH  *UNUSUAL BRUISING OR BLEEDING  TENDERNESS IN MOUTH AND THROAT WITH OR WITHOUT PRESENCE OF ULCERS  *URINARY PROBLEMS  *BOWEL PROBLEMS  UNUSUAL RASH Items with * indicate a potential emergency and should be followed up as soon as possible.  Feel free to call the clinic you have any questions or concerns. The clinic phone number is [phone].   Doxorubicin injection What is this medicine? DOXORUBICIN (dox oh ROO bi sin) is a chemotherapy drug. It is used to treat many kinds of cancer like Hodgkin's disease, leukemia, non-Hodgkin's lymphoma, neuroblastoma, sarcoma, and Wilms' tumor. It is also used to treat bladder cancer, breast cancer, lung cancer, ovarian cancer, stomach cancer, and thyroid cancer. This medicine may be used for other purposes; ask your health care provider or pharmacist if you have questions. COMMON BRAND NAME(S): Adriamycin, Adriamycin PFS, Adriamycin RDF, Rubex What should I tell my health care provider before I take this medicine? They need to know if you have any of these conditions: -blood disorders -heart disease, recent heart attack -infection (especially a virus infection such as chickenpox, cold sores, or herpes) -irregular heartbeat -liver disease -recent or ongoing radiation therapy -an unusual or allergic reaction to doxorubicin, other chemotherapy  agents, other medicines, foods, dyes, or preservatives -pregnant or trying to get pregnant -breast-feeding How should I use this medicine? This drug is given as an infusion into a vein. It is administered in a hospital or clinic by a specially trained health care professional. If you have pain, swelling, burning or any unusual feeling around the site of your injection, tell your health care professional right away. Talk to your pediatrician regarding the use of this medicine in children. Special care may be needed. Overdosage: If you think you have taken too much of this medicine contact a poison control center or emergency room at once. NOTE: This medicine is only for you. Do not share this medicine with others. What if I miss a dose? It is important not to miss your dose. Call your doctor or health care professional if you are unable to keep an appointment. What may interact with this medicine? Do not take this medicine with any of the following medications: -cisapride -droperidol -halofantrine -pimozide -zidovudine This medicine may also interact with the following medications: -chloroquine -chlorpromazine -clarithromycin -cyclophosphamide -cyclosporine -erythromycin -medicines for depression, anxiety, or psychotic disturbances -medicines for irregular heart beat like amiodarone, bepridil, dofetilide, encainide, flecainide, propafenone, quinidine -medicines for seizures like ethotoin, fosphenytoin, phenytoin -medicines for nausea, vomiting like dolasetron, ondansetron, palonosetron -medicines to increase blood counts like filgrastim, pegfilgrastim, sargramostim -methadone -methotrexate -pentamidine -progesterone -vaccines -verapamil Talk to your doctor or health care professional before taking any of these medicines: -acetaminophen -aspirin -ibuprofen -ketoprofen -naproxen This list may not describe all possible interactions. Give your health care provider a list of all  the medicines, herbs, non-prescription drugs, or dietary supplements you use. Also tell them if you smoke, drink alcohol, or use   illegal drugs. Some items may interact with your medicine. What should I watch for while using this medicine? Your condition will be monitored carefully while you are receiving this medicine. You will need important blood work done while you are taking this medicine. This drug may make you feel generally unwell. This is not uncommon, as chemotherapy can affect healthy cells as well as cancer cells. Report any side effects. Continue your course of treatment even though you feel ill unless your doctor tells you to stop. Your urine may turn red for a few days after your dose. This is not blood. If your urine is dark or brown, call your doctor. In some cases, you may be given additional medicines to help with side effects. Follow all directions for their use. Call your doctor or health care professional for advice if you get a fever, chills or sore throat, or other symptoms of a cold or flu. Do not treat yourself. This drug decreases your body's ability to fight infections. Try to avoid being around people who are sick. This medicine may increase your risk to bruise or bleed. Call your doctor or health care professional if you notice any unusual bleeding. Be careful brushing and flossing your teeth or using a toothpick because you may get an infection or bleed more easily. If you have any dental work done, tell your dentist you are receiving this medicine. Avoid taking products that contain aspirin, acetaminophen, ibuprofen, naproxen, or ketoprofen unless instructed by your doctor. These medicines may hide a fever. Men and women of childbearing age should use effective birth control methods while using taking this medicine. Do not become pregnant while taking this medicine. There is a potential for serious side effects to an unborn child. Talk to your health care professional or  pharmacist for more information. Do not breast-feed an infant while taking this medicine. Do not let others touch your urine or other body fluids for 5 days after each treatment with this medicine. Caregivers should wear latex gloves to avoid touching body fluids during this time. There is a maximum amount of this medicine you should receive throughout your life. The amount depends on the medical condition being treated and your overall health. Your doctor will watch how much of this medicine you receive in your lifetime. Tell your doctor if you have taken this medicine before. What side effects may I notice from receiving this medicine? Side effects that you should report to your doctor or health care professional as soon as possible: -allergic reactions like skin rash, itching or hives, swelling of the face, lips, or tongue -low blood counts - this medicine may decrease the number of white blood cells, red blood cells and platelets. You may be at increased risk for infections and bleeding. -signs of infection - fever or chills, cough, sore throat, pain or difficulty passing urine -signs of decreased platelets or bleeding - bruising, pinpoint red spots on the skin, black, tarry stools, blood in the urine -signs of decreased red blood cells - unusually weak or tired, fainting spells, lightheadedness -breathing problems -chest pain -fast, irregular heartbeat -mouth sores -nausea, vomiting -pain, swelling, redness at site where injected -pain, tingling, numbness in the hands or feet -swelling of ankles, feet, or hands -unusual bleeding or bruising Side effects that usually do not require medical attention (report to your doctor or health care professional if they continue or are bothersome): -diarrhea -facial flushing -hair loss -loss of appetite -missed menstrual periods -nail discoloration or damage -red   or watery eyes -red colored urine -stomach upset This list may not describe all  possible side effects. Call your doctor for medical advice about side effects. You may report side effects to FDA at 1-800-FDA-1088. Where should I keep my medicine? This drug is given in a hospital or clinic and will not be stored at home. NOTE: This sheet is a summary. It may not cover all possible information. If you have questions about this medicine, talk to your doctor, pharmacist, or health care provider.  2015, Elsevier/Gold Standard. (2012-05-09 09:54:34)  Cyclophosphamide injection What is this medicine? CYCLOPHOSPHAMIDE (sye kloe FOSS fa mide) is a chemotherapy drug. It slows the growth of cancer cells. This medicine is used to treat many types of cancer like lymphoma, myeloma, leukemia, breast cancer, and ovarian cancer, to name a few. This medicine may be used for other purposes; ask your health care provider or pharmacist if you have questions. COMMON BRAND NAME(S): Cytoxan, Neosar What should I tell my health care provider before I take this medicine? They need to know if you have any of these conditions: -blood disorders -history of other chemotherapy -infection -kidney disease -liver disease -recent or ongoing radiation therapy -tumors in the bone marrow -an unusual or allergic reaction to cyclophosphamide, other chemotherapy, other medicines, foods, dyes, or preservatives -pregnant or trying to get pregnant -breast-feeding How should I use this medicine? This drug is usually given as an injection into a vein or muscle or by infusion into a vein. It is administered in a hospital or clinic by a specially trained health care professional. Talk to your pediatrician regarding the use of this medicine in children. Special care may be needed. Overdosage: If you think you have taken too much of this medicine contact a poison control center or emergency room at once. NOTE: This medicine is only for you. Do not share this medicine with others. What if I miss a dose? It is  important not to miss your dose. Call your doctor or health care professional if you are unable to keep an appointment. What may interact with this medicine? This medicine may interact with the following medications: -amiodarone -amphotericin B -azathioprine -certain antiviral medicines for HIV or AIDS such as protease inhibitors (e.g., indinavir, ritonavir) and zidovudine -certain blood pressure medications such as benazepril, captopril, enalapril, fosinopril, lisinopril, moexipril, monopril, perindopril, quinapril, ramipril, trandolapril -certain cancer medications such as anthracyclines (e.g., daunorubicin, doxorubicin), busulfan, cytarabine, paclitaxel, pentostatin, tamoxifen, trastuzumab -certain diuretics such as chlorothiazide, chlorthalidone, hydrochlorothiazide, indapamide, metolazone -certain medicines that treat or prevent blood clots like warfarin -certain muscle relaxants such as succinylcholine -cyclosporine -etanercept -indomethacin -medicines to increase blood counts like filgrastim, pegfilgrastim, sargramostim -medicines used as general anesthesia -metronidazole -natalizumab This list may not describe all possible interactions. Give your health care provider a list of all the medicines, herbs, non-prescription drugs, or dietary supplements you use. Also tell them if you smoke, drink alcohol, or use illegal drugs. Some items may interact with your medicine. What should I watch for while using this medicine? Visit your doctor for checks on your progress. This drug may make you feel generally unwell. This is not uncommon, as chemotherapy can affect healthy cells as well as cancer cells. Report any side effects. Continue your course of treatment even though you feel ill unless your doctor tells you to stop. Drink water or other fluids as directed. Urinate often, even at night. In some cases, you may be given additional medicines to help with side effects. Follow all directions   for  their use. Call your doctor or health care professional for advice if you get a fever, chills or sore throat, or other symptoms of a cold or flu. Do not treat yourself. This drug decreases your body's ability to fight infections. Try to avoid being around people who are sick. This medicine may increase your risk to bruise or bleed. Call your doctor or health care professional if you notice any unusual bleeding. Be careful brushing and flossing your teeth or using a toothpick because you may get an infection or bleed more easily. If you have any dental work done, tell your dentist you are receiving this medicine. You may get drowsy or dizzy. Do not drive, use machinery, or do anything that needs mental alertness until you know how this medicine affects you. Do not become pregnant while taking this medicine or for 1 year after stopping it. Women should inform their doctor if they wish to become pregnant or think they might be pregnant. Men should not father a child while taking this medicine and for 4 months after stopping it. There is a potential for serious side effects to an unborn child. Talk to your health care professional or pharmacist for more information. Do not breast-feed an infant while taking this medicine. This medicine may interfere with the ability to have a child. This medicine has caused ovarian failure in some women. This medicine has caused reduced sperm counts in some men. You should talk with your doctor or health care professional if you are concerned about your fertility. If you are going to have surgery, tell your doctor or health care professional that you have taken this medicine. What side effects may I notice from receiving this medicine? Side effects that you should report to your doctor or health care professional as soon as possible: -allergic reactions like skin rash, itching or hives, swelling of the face, lips, or tongue -low blood counts - this medicine may decrease the  number of white blood cells, red blood cells and platelets. You may be at increased risk for infections and bleeding. -signs of infection - fever or chills, cough, sore throat, pain or difficulty passing urine -signs of decreased platelets or bleeding - bruising, pinpoint red spots on the skin, black, tarry stools, blood in the urine -signs of decreased red blood cells - unusually weak or tired, fainting spells, lightheadedness -breathing problems -dark urine -dizziness -palpitations -swelling of the ankles, feet, hands -trouble passing urine or change in the amount of urine -weight gain -yellowing of the eyes or skin Side effects that usually do not require medical attention (report to your doctor or health care professional if they continue or are bothersome): -changes in nail or skin color -hair loss -missed menstrual periods -mouth sores -nausea, vomiting This list may not describe all possible side effects. Call your doctor for medical advice about side effects. You may report side effects to FDA at 1-800-FDA-1088. Where should I keep my medicine? This drug is given in a hospital or clinic and will not be stored at home. NOTE: This sheet is a summary. It may not cover all possible information. If you have questions about this medicine, talk to your doctor, pharmacist, or health care provider.  2015, Elsevier/Gold Standard. (2011-11-26 16:22:58)  

## 2014-04-03 NOTE — Telephone Encounter (Signed)
Appts made and avs printed for pt  Theresa Huff

## 2014-04-03 NOTE — Assessment & Plan Note (Addendum)
Left breast invasive ductal carcinoma grade 1; 4.8 cm with low-grade DCIS; margins negative 1/4 lymph nodes positive, ER 100% PR 100% HER-2 negative ratio 1.18 Ki-67 26% and 19% T2 N1 stage IIB  Treatment plan:  1. Systemic chemotherapy with dose dense Adriamycin and Cytoxan 4 followed by Abraxane weekly 12 2. Followed by adjuvant radiation 3. Followed by adjuvant antiestrogen therapy with anastrozole for 5 years  Current treatment: Cycle 1 day 1 dose dense Adriamycin and Cytoxan  Chemotherapy monitoring 1. Echocardiogram 03/26/2014 EF 55-60% 2. Port has been placed 3. Consent has been obtained 4. Chemotherapy class completed  Return to clinic in 1 week for toxicity check

## 2014-04-04 ENCOUNTER — Telehealth: Payer: Self-pay | Admitting: *Deleted

## 2014-04-04 NOTE — Telephone Encounter (Signed)
Patient has no questions or signs/symptoms from chemotherapy. Instructed patient to call the clinic if she has any questions. Patient verbalized understanding.

## 2014-04-04 NOTE — Telephone Encounter (Signed)
-----   Message from Arty Baumgartner, RN sent at 04/03/2014  9:07 AM EST ----- Regarding: First time chemo First time A/C  Dr Lindi Adie.  [phone]

## 2014-04-08 ENCOUNTER — Ambulatory Visit: Payer: 59 | Admitting: Physical Therapy

## 2014-04-08 DIAGNOSIS — R609 Edema, unspecified: Secondary | ICD-10-CM

## 2014-04-08 DIAGNOSIS — M439 Deforming dorsopathy, unspecified: Secondary | ICD-10-CM | POA: Diagnosis not present

## 2014-04-08 NOTE — Therapy (Signed)
[place], [place], [address] Phone: [phone]   Fax:  [phone]  Physical Therapy Treatment  Patient Details  Name: Theresa Huff MRN: [id number] Date of Birth: Aug 15, 1950 Referring Provider:  Antony Contras, MD  Encounter Date: 04/08/2014      PT End of Session - 04/08/14 2140    Visit Number 2   Number of Visits 9   Date for PT Re-Evaluation 04/25/14   PT Start Time 1602   PT Stop Time 1646   PT Time Calculation (min) 44 min      Past Medical History  Diagnosis Date  . Cough   . Weight gain   . Hot flashes   . Insomnia, unspecified   . Depressive disorder, not elsewhere classified   . Allergic rhinitis due to pollen   . Plantar fasciitis   . Restless leg syndrome 09/09/2010  . Breast cancer   . Skin cancer   . OSA (obstructive sleep apnea) 09/09/2010    mild-no cpap recommended  . GERD (gastroesophageal reflux disease)   . Wears glasses   . Anemia     with pregnancy    Past Surgical History  Procedure Laterality Date  . Tonsillectomy    . Melanoma excision      right leg, left arm, right arm (several from each area)  . Colonoscopy    . Mastectomy w/ sentinel node biopsy Left 03/07/2014    Procedure: LEFT TOTAL MASTECTOMY WITH LEFT SENTINEL LYMPH NODE BIOPSY;  Surgeon: Rolm Bookbinder, MD;  Location: [facility];  Service: General;  Laterality: Left;  . Portacath placement Right 04/01/2014    Procedure: INSERTION PORT-A-CATH;  Surgeon: Rolm Bookbinder, MD;  Location: [facility];  Service: General;  Laterality: Right;    There were no vitals filed for this visit.  Visit Diagnosis:  Postoperative edema      Subjective Assessment - 04/08/14 1606    Symptoms "I forgot to wear my compression sleeve thing--I've been wearing it; it seems fine.  The pain at anterior elbow has dissipated to the point of being nearly gone."   Currently in Pain? No/denies                        [facility] Adult PT Treatment/Exercise - 04/08/14 0001    Manual Therapy   Manual Therapy Manual Lymphatic Drainage (MLD)   Manual Lymphatic Drainage (MLD) In supine:  short neck, superficial and deep abdomen (including instruction in diaphragmatic breathing), right axilla and anterior interaxillary anastomosis, left groin and axillo-inguinal anastomosis, and left UE from dorsal hand to shoulder; in right sidelying, left axillo-inguinal anastomosis and posterior interaxillary anastomosis, left to right.                PT Education - 04/08/14 2139    Education provided Yes   Education Details diaphragmatic breathing; basic principles and techniques of manual lymph drainage   Person(s) Educated Patient   Methods Explanation;Demonstration   Comprehension Need further instruction;Verbalized understanding           Short Term Clinic Goals - 03/28/14 1627    CC Short Term Goal  #1   Title short term = long term           Breast Clinic Goals - 02/13/14 1043    Patient will be able to verbalize understanding of pertinent lymphedema risk reduction practices relevant to her diagnosis specifically related to skin care.   Time  1   Period Days   Status Achieved   Patient will be able to return demonstrate and/or verbalize understanding of the post-op home exercise program related to regaining shoulder range of motion.   Time 1   Period Days   Status Achieved   Patient will be able to verbalize understanding of the importance of attending the postoperative After Breast Cancer Class for further lymphedema risk reduction education and therapeutic exercise.   Time 1   Period Days   Status Achieved          Long Term Clinic Goals - 03/28/14 1627    CC Long Term Goal  #1   Title pt will verbalize understanding of lymphedema risk reduction practices   Time 4   Period Weeks   Status New   CC Long Term Goal  #2   Title Pt will reports 50%  reduction in pain and swellling from axillary cording   Time 4   Period Weeks   Status New   CC Long Term Goal  #3   Title Pt will verbalize understanding of progressive resistance strength training   Time 4   Period Weeks   Status New   CC Long Term Goal  #4   Title pt will have decrease in .5 cm left arm circumference at 10 cm proximal to the olecranon   Time 4   Period Weeks   Status New            Plan - 04/08/14 2141    Clinical Impression Statement Patient reported feeling good after session; appeared attentive to instruction today.   Pt will benefit from skilled therapeutic intervention in order to improve on the following deficits Increased edema;Decreased knowledge of precautions;Pain;Impaired UE functional use;Decreased strength   PT Treatment/Interventions Manual lymph drainage;Patient/family education   PT Next Visit Plan Reassess.  Have patient perform self-manual lymph drainage; loan DVD.   Consulted and Agree with Plan of Care Patient        Problem List Patient Active Problem List   Diagnosis Date Noted  . S/P mastectomy 03/07/2014  . Breast cancer 03/07/2014  . Breast cancer of upper-outer quadrant of left female breast 02/06/2014  . OSA (obstructive sleep apnea) 09/09/2010  . Restless leg syndrome 09/09/2010  . Depressive disorder, not elsewhere classified   . Asthma, cough variant   . Insomnia, unspecified   . Allergic rhinitis due to pollen     [facility] 04/08/2014, 9:44 PM  [place], [place], [address] Phone: [phone]   Fax:  [place], PT 04/08/2014 9:44 PM

## 2014-04-10 ENCOUNTER — Other Ambulatory Visit (HOSPITAL_BASED_OUTPATIENT_CLINIC_OR_DEPARTMENT_OTHER): Payer: 59

## 2014-04-10 ENCOUNTER — Ambulatory Visit (HOSPITAL_BASED_OUTPATIENT_CLINIC_OR_DEPARTMENT_OTHER): Payer: 59 | Admitting: Hematology and Oncology

## 2014-04-10 ENCOUNTER — Telehealth: Payer: Self-pay | Admitting: Hematology and Oncology

## 2014-04-10 VITALS — BP 103/78 | HR 79 | Temp 98.7°F | Resp 18 | Ht 65.0 in | Wt 149.1 lb

## 2014-04-10 DIAGNOSIS — C773 Secondary and unspecified malignant neoplasm of axilla and upper limb lymph nodes: Secondary | ICD-10-CM

## 2014-04-10 DIAGNOSIS — R5383 Other fatigue: Secondary | ICD-10-CM

## 2014-04-10 DIAGNOSIS — D696 Thrombocytopenia, unspecified: Secondary | ICD-10-CM

## 2014-04-10 DIAGNOSIS — C50412 Malignant neoplasm of upper-outer quadrant of left female breast: Secondary | ICD-10-CM

## 2014-04-10 DIAGNOSIS — D709 Neutropenia, unspecified: Secondary | ICD-10-CM

## 2014-04-10 LAB — CBC WITH DIFFERENTIAL/PLATELET
BASO%: 1 % (ref 0.0–2.0)
BASOS ABS: 0 10*3/uL (ref 0.0–0.1)
EOS ABS: 0.5 10*3/uL (ref 0.0–0.5)
EOS%: 24.4 % — AB (ref 0.0–7.0)
HCT: 36.3 % (ref 34.8–46.6)
HGB: 11.7 g/dL (ref 11.6–15.9)
LYMPH#: 1.1 10*3/uL (ref 0.9–3.3)
LYMPH%: 56 % — AB (ref 14.0–49.7)
MCH: 29.5 pg (ref 25.1–34.0)
MCHC: 32.2 g/dL (ref 31.5–36.0)
MCV: 91.7 fL (ref 79.5–101.0)
MONO#: 0.1 10*3/uL (ref 0.1–0.9)
MONO%: 7.3 % (ref 0.0–14.0)
NEUT#: 0.2 10*3/uL — CL (ref 1.5–6.5)
NEUT%: 11.3 % — ABNORMAL LOW (ref 38.4–76.8)
Platelets: 103 10*3/uL — ABNORMAL LOW (ref 145–400)
RBC: 3.96 10*6/uL (ref 3.70–5.45)
RDW: 13.2 % (ref 11.2–14.5)
WBC: 1.9 10*3/uL — AB (ref 3.9–10.3)

## 2014-04-10 LAB — COMPREHENSIVE METABOLIC PANEL (CC13)
ALBUMIN: 3.3 g/dL — AB (ref 3.5–5.0)
ALK PHOS: 71 U/L (ref 40–150)
ALT: 9 U/L (ref 0–55)
AST: <7 U/L (ref 5–34)
Anion Gap: 8 mEq/L (ref 3–11)
BUN: 10.2 mg/dL (ref 7.0–26.0)
CHLORIDE: 106 meq/L (ref 98–109)
CO2: 26 mEq/L (ref 22–29)
Calcium: 8.8 mg/dL (ref 8.4–10.4)
Creatinine: 0.7 mg/dL (ref 0.6–1.1)
EGFR: >90 mL/min/{1.73_m2} (ref >90)
Glucose: 80 mg/dl (ref 70–140)
POTASSIUM: 4.5 meq/L (ref 3.5–5.1)
Sodium: 140 mEq/L (ref 136–145)
TOTAL PROTEIN: 6.1 g/dL — AB (ref 6.4–8.3)
Total Bilirubin: 0.52 mg/dL (ref 0.20–1.20)

## 2014-04-10 NOTE — Progress Notes (Signed)
Patient Care Team: Antony Contras, MD as PCP - General (Family Medicine) Elsie Stain, MD (Pulmonary Disease) Rolm Bookbinder, MD as Consulting Physician (General Surgery) Nicholas Lose, MD as Consulting Physician (Hematology and Oncology) [name], MD as Consulting Physician (Radiation Oncology) Rockwell Germany, RN as Registered Nurse Mauro Kaufmann, RN as Registered Nurse Holley Bouche, NP as Nurse Practitioner (Nurse Practitioner)  DIAGNOSIS: Breast cancer of upper-outer quadrant of left female breast   Staging form: Breast, AJCC 7th Edition     Clinical stage from 02/13/2014: Stage IIB (T3, N0, M0) - Unsigned       Staging comments: Staged at breast conference on 1.20.16      Pathologic stage from 03/11/2014: Stage IIB (T2, N1a, cM0) - Signed by Seward Grater, MD on 03/19/2014       Staging comments: Staged on final mastectomy specimen by Dr. Avis Epley    SUMMARY OF ONCOLOGIC HISTORY:   Breast cancer of upper-outer quadrant of left female breast   02/04/2014 Initial Diagnosis left breast: Invasive ductal carcinoma with DCIS ER 100%, PR 100%, HER-2 negative, Ki-67 ranged from 19-26%; third biopsy showed invasive mammary cancer with lobular features and intraductal papilloma, ER/PR positive HER-2 negative   02/11/2014 Breast MRI left breast: 3 masses that are confluent measuring 5.6 x 2.7 x 3 cm extends to the left aspect of area along the penis to involve skin of the area no lymph nodes detected right breast negative   03/07/2014 Surgery Left breast invasive ductal carcinoma grade 1; 4.8 cm with low-grade DCIS; margins negative 1/4 lymph nodes positive, ER 100% PR 100% HER-2 negative ratio 1.18 Ki-67 26% and 19% T2 N1 stage IIB   04/03/2014 -  Chemotherapy Adjuvant chemotherapy with dose dense Adriamycin and Cytoxan followed by weekly Abraxane 12    CHIEF COMPLIANT: Cycle 1 day 8 dose dense Adriamycin and Cytoxan  INTERVAL HISTORY: Theresa Huff is a 64 year old lady with  above-mentioned history of left breast cancer treated with lumpectomy and is now on adjuvant chemotherapy. Today cycle 1 day 8 of dose dense Adriamycin and Cytoxan. Patient reports that she felt extremely tired over the last weekend. She is now starting to feel better slowly over time. She denies any fevers or chills. She denies any nausea or vomiting.  REVIEW OF SYSTEMS:   Constitutional: Denies fevers, chills or abnormal weight loss, extreme tiredness Eyes: Denies blurriness of vision Ears, nose, mouth, throat, and face: Denies mucositis or sore throat Respiratory: Denies cough, dyspnea or wheezes Cardiovascular: Denies palpitation, chest discomfort or lower extremity swelling Gastrointestinal:  Denies nausea, heartburn or change in bowel habits Skin: Denies abnormal skin rashes Lymphatics: Denies new lymphadenopathy or easy bruising Neurological:Denies numbness, tingling or new weaknesses Behavioral/Psych: Mood is stable, no new changes  Breast:  denies any pain or lumps or nodules in either breasts All other systems were reviewed with the patient and are negative.  I have reviewed the past medical history, past surgical history, social history and family history with the patient and they are unchanged from previous note.  ALLERGIES:  is allergic to codeine; iodine; primidone; sulfa antibiotics; and tape.  MEDICATIONS:  Current Outpatient Prescriptions  Medication Sig Dispense Refill  . calcium carbonate (OS-CAL) 600 MG TABS tablet Take 600 mg by mouth daily.    . cetirizine (ZYRTEC) 10 MG tablet Take 10 mg by mouth daily.      . Cholecalciferol (VITAMIN D PO) Take 2,000 Units by mouth daily.    Marland Kitchen dexamethasone (  DECADRON) 4 MG tablet Take 2 tablets by mouth once a day on the day after chemotherapy and then take 2 tablets two times a day for 2 days. Take with food. 30 tablet 1  . DULoxetine (CYMBALTA) 30 MG capsule     . DULoxetine (CYMBALTA) 60 MG capsule Take 30 mg by mouth daily.      Marland Kitchen esomeprazole (NEXIUM) 20 MG packet Take 20 mg by mouth daily before breakfast.    . Eszopiclone (ESZOPICLONE) 3 MG TABS Take 3 mg by mouth at bedtime. Take immediately before bedtime    . lidocaine-prilocaine (EMLA) cream Apply to affected area once 30 g 3  . LORazepam (ATIVAN) 0.5 MG tablet Take 1 tablet (0.5 mg total) by mouth every 6 (six) hours as needed (Nausea or vomiting). 30 tablet 0  . mometasone (NASONEX) 50 MCG/ACT nasal spray Place 2 sprays into the nose daily.      . ondansetron (ZOFRAN) 4 MG tablet     . ondansetron (ZOFRAN) 8 MG tablet Take 1 tablet (8 mg total) by mouth 2 (two) times daily as needed. Start on the third day after chemotherapy. 30 tablet 1  . oxyCODONE (OXY IR/ROXICODONE) 5 MG immediate release tablet Take 1-2 tablets (5-10 mg total) by mouth every 4 (four) hours as needed for moderate pain. 30 tablet 0  . prochlorperazine (COMPAZINE) 10 MG tablet Take 1 tablet (10 mg total) by mouth every 6 (six) hours as needed (Nausea or vomiting). 30 tablet 1  . Vitamin D, Ergocalciferol, (DRISDOL) 50000 UNITS CAPS capsule Take 50,000 Units by mouth every 7 (seven) days.     No current facility-administered medications for this visit.    PHYSICAL EXAMINATION: ECOG PERFORMANCE STATUS: 1 - Symptomatic but completely ambulatory  Filed Vitals:   04/10/14 1015  BP: 103/78  Pulse: 79  Temp: 98.7 F (37.1 C)  Resp: 18   Filed Weights   04/10/14 1015  Weight: 149 lb 1.6 oz (67.631 kg)    GENERAL:alert, no distress and comfortable SKIN: skin color, texture, turgor are normal, no rashes or significant lesions EYES: normal, Conjunctiva are pink and non-injected, sclera clear OROPHARYNX:no exudate, no erythema and lips, buccal mucosa, and tongue normal  NECK: supple, thyroid normal size, non-tender, without nodularity LYMPH:  no palpable lymphadenopathy in the cervical, axillary or inguinal LUNGS: clear to auscultation and percussion with normal breathing effort HEART:  regular rate & rhythm and no murmurs and no lower extremity edema ABDOMEN:abdomen soft, non-tender and normal bowel sounds Musculoskeletal:no cyanosis of digits and no clubbing  NEURO: alert & oriented x 3 with fluent speech, no focal motor/sensory deficits   LABORATORY DATA:  I have reviewed the data as listed   Chemistry      Component Value Date/Time   NA 140 04/10/2014 1001   NA 141 03/27/2014 1439   K 4.5 04/10/2014 1001   K 4.0 03/27/2014 1439   CL 108 03/27/2014 1439   CO2 26 04/10/2014 1001   CO2 28 03/27/2014 1439   BUN 10.2 04/10/2014 1001   BUN 9 03/27/2014 1439   CREATININE 0.7 04/10/2014 1001   CREATININE 0.78 03/27/2014 1439      Component Value Date/Time   CALCIUM 8.8 04/10/2014 1001   CALCIUM 9.5 03/27/2014 1439   ALKPHOS 71 04/10/2014 1001   ALKPHOS 76 09/14/2010 1708   AST <7 04/10/2014 1001   AST 18 09/14/2010 1708   ALT 9 04/10/2014 1001   ALT 21 09/14/2010 1708   BILITOT 0.52 04/10/2014  1001   BILITOT 0.4 09/14/2010 1708       Lab Results  Component Value Date   WBC 1.9* 04/10/2014   HGB 11.7 04/10/2014   HCT 36.3 04/10/2014   MCV 91.7 04/10/2014   PLT 103* 04/10/2014   NEUTROABS 0.2* 04/10/2014     ASSESSMENT & PLAN:  Breast cancer of upper-outer quadrant of left female breast Breast cancer of upper-outer quadrant of left female breast Left breast invasive ductal carcinoma grade 1; 4.8 cm with low-grade DCIS; margins negative 1/4 lymph nodes positive, ER 100% PR 100% HER-2 negative ratio 1.18 Ki-67 26% and 19% T2 N1 stage IIB  Treatment plan:  1. Systemic chemotherapy with dose dense Adriamycin and Cytoxan 4 followed by Abraxane weekly 12 2. Followed by adjuvant radiation 3. Followed by adjuvant antiestrogen therapy with anastrozole for 5 years  Current treatment: Cycle 1 day 1 dose dense Adriamycin and Cytoxan, today is cycle 1 day 8  Chemotherapy toxicities: 1. Profound fatigue for 2-3 days after chemotherapy starting day 4 2.  Grade 4 neutropenia: We'll decrease the dosage of cycle 2 chemotherapy of Adriamycin 50 mg/m and Cytoxan 500 mg/m 3. Thrombocytopenia grade 1  Closely monitoring for chemotherapy toxicities. Return to clinic in 1 week for cycle 2  No orders of the defined types were placed in this encounter.   The patient has a good understanding of the overall plan. she agrees with it. She will call with any problems that may develop before her next visit here.   Rulon Eisenmenger, MD

## 2014-04-10 NOTE — Telephone Encounter (Signed)
appts made and avs printed for pt  Theresa Huff °

## 2014-04-10 NOTE — Assessment & Plan Note (Signed)
Breast cancer of upper-outer quadrant of left female breast Left breast invasive ductal carcinoma grade 1; 4.8 cm with low-grade DCIS; margins negative 1/4 lymph nodes positive, ER 100% PR 100% HER-2 negative ratio 1.18 Ki-67 26% and 19% T2 N1 stage IIB  Treatment plan:  1. Systemic chemotherapy with dose dense Adriamycin and Cytoxan 4 followed by Abraxane weekly 12 2. Followed by adjuvant radiation 3. Followed by adjuvant antiestrogen therapy with anastrozole for 5 years  Current treatment: Cycle 1 day 1 dose dense Adriamycin and Cytoxan, today is cycle 1 day 8  Chemotherapy toxicities:  Closely monitoring for chemotherapy toxicities. Return to clinic in 1 week for cycle 2

## 2014-04-11 ENCOUNTER — Ambulatory Visit: Payer: 59

## 2014-04-11 DIAGNOSIS — M6281 Muscle weakness (generalized): Secondary | ICD-10-CM

## 2014-04-11 DIAGNOSIS — M439 Deforming dorsopathy, unspecified: Secondary | ICD-10-CM | POA: Diagnosis not present

## 2014-04-11 DIAGNOSIS — C50912 Malignant neoplasm of unspecified site of left female breast: Secondary | ICD-10-CM

## 2014-04-11 DIAGNOSIS — R609 Edema, unspecified: Secondary | ICD-10-CM

## 2014-04-11 NOTE — Therapy (Signed)
[place], [place], [address] Phone: [phone]   Fax:  [phone]  Physical Therapy Treatment  Patient Details  Name: Theresa Huff MRN: [id number] Date of Birth: 1950-05-20 Referring Provider:  Antony Contras, MD  Encounter Date: 04/11/2014      PT End of Session - 04/11/14 1659    Visit Number 3   Number of Visits 9   Date for PT Re-Evaluation 04/25/14   PT Start Time 6962   PT Stop Time 1656   PT Time Calculation (min) 43 min      Past Medical History  Diagnosis Date  . Cough   . Weight gain   . Hot flashes   . Insomnia, unspecified   . Depressive disorder, not elsewhere classified   . Allergic rhinitis due to pollen   . Plantar fasciitis   . Restless leg syndrome 09/09/2010  . Breast cancer   . Skin cancer   . OSA (obstructive sleep apnea) 09/09/2010    mild-no cpap recommended  . GERD (gastroesophageal reflux disease)   . Wears glasses   . Anemia     with pregnancy    Past Surgical History  Procedure Laterality Date  . Tonsillectomy    . Melanoma excision      right leg, left arm, right arm (several from each area)  . Colonoscopy    . Mastectomy w/ sentinel node biopsy Left 03/07/2014    Procedure: LEFT TOTAL MASTECTOMY WITH LEFT SENTINEL LYMPH NODE BIOPSY;  Surgeon: Rolm Bookbinder, MD;  Location: [facility];  Service: General;  Laterality: Left;  . Portacath placement Right 04/01/2014    Procedure: INSERTION PORT-A-CATH;  Surgeon: Rolm Bookbinder, MD;  Location: [facility];  Service: General;  Laterality: Right;    There were no vitals filed for this visit.  Visit Diagnosis:  Postoperative edema  Muscular weakness  Breast cancer, left  Postural deformity      Subjective Assessment - 04/11/14 1616    Symptoms My Lt lateral trunk near axilla feels bruised today, maybe the nerves are healing.    Currently in Pain? No/denies                        [facility] Adult PT Treatment/Exercise - 04/11/14 0001    Manual Therapy   Edema Management 1/2" gray foam in stockinette issued for pt to wear in bra at edematous area.   Manual Lymphatic Drainage (MLD) In supine:  Diaphragmatic breathing, short neck, right axilla and anterior interaxillary anastomosis, left groin and axillo-inguinal anastomosis, and left UE from dorsal hand to shoulder; in right sidelying, left axillo-inguinal anastomosis and posterior interaxillary anastomosis, left to right instructing pt throughout.                PT Education - 04/11/14 1700    Education provided Yes   Education Details Self manual lymph drainage   Person(s) Educated Patient   Methods Explanation;Demonstration;Tactile cues;Verbal cues;Handout   Comprehension Verbalized understanding;Returned demonstration;Need further instruction;Verbal cues required           Short Term Clinic Goals - 03/28/14 1627    CC Short Term Goal  #1   Title short term = long term           Breast Clinic Goals - 02/13/14 1043    Patient will be able to verbalize understanding of pertinent lymphedema risk reduction practices relevant to her diagnosis specifically related to skin care.  Time 1   Period Days   Status Achieved   Patient will be able to return demonstrate and/or verbalize understanding of the post-op home exercise program related to regaining shoulder range of motion.   Time 1   Period Days   Status Achieved   Patient will be able to verbalize understanding of the importance of attending the postoperative After Breast Cancer Class for further lymphedema risk reduction education and therapeutic exercise.   Time 1   Period Days   Status Achieved          Long Term Clinic Goals - 03/28/14 1627    CC Long Term Goal  #1   Title pt will verbalize understanding of lymphedema risk reduction practices   Time 4   Period Weeks   Status New   CC Long Term Goal   #2   Title Pt will reports 50% reduction in pain and swellling from axillary cording   Time 4   Period Weeks   Status New   CC Long Term Goal  #3   Title Pt will verbalize understanding of progressive resistance strength training   Time 4   Period Weeks   Status New   CC Long Term Goal  #4   Title pt will have decrease in .5 cm left arm circumference at 10 cm proximal to the olecranon   Time 4   Period Weeks   Status New            Plan - 04/11/14 1700    Clinical Impression Statement Pt demonstrated good understanding of sequence of manual lymph drainage by end of treatment today, able to verbalize sequence correctly. Also loaned pt DVD of manual lymph drainage that she reports will try to get her husband to watch with her. Pt asked appropriate questions throughout treatment today.   Pt will benefit from skilled therapeutic intervention in order to improve on the following deficits Increased edema;Decreased knowledge of precautions;Pain;Impaired UE functional use;Decreased strength   Rehab Potential Excellent   Clinical Impairments Affecting Rehab Potential none   PT Frequency 2x / week   PT Duration 4 weeks   PT Treatment/Interventions Manual lymph drainage;Patient/family education   PT Next Visit Plan Have patient perform self-manual lymph drainage and review prn. Instruct husband if he comes.        Problem List Patient Active Problem List   Diagnosis Date Noted  . S/P mastectomy 03/07/2014  . Breast cancer 03/07/2014  . Breast cancer of upper-outer quadrant of left female breast 02/06/2014  . OSA (obstructive sleep apnea) 09/09/2010  . Restless leg syndrome 09/09/2010  . Depressive disorder, not elsewhere classified   . Asthma, cough variant   . Insomnia, unspecified   . Allergic rhinitis due to pollen     Otelia Limes, PTA 04/11/2014, 5:07 PM  [place], [place],  [address] Phone: [phone]   Fax:  [phone]

## 2014-04-11 NOTE — Patient Instructions (Signed)

## 2014-04-15 ENCOUNTER — Ambulatory Visit: Payer: 59 | Admitting: Physical Therapy

## 2014-04-15 DIAGNOSIS — R609 Edema, unspecified: Secondary | ICD-10-CM

## 2014-04-15 DIAGNOSIS — M439 Deforming dorsopathy, unspecified: Secondary | ICD-10-CM | POA: Diagnosis not present

## 2014-04-15 DIAGNOSIS — M6281 Muscle weakness (generalized): Secondary | ICD-10-CM

## 2014-04-15 NOTE — Therapy (Signed)
[place], [place], [address] Phone: [phone]   Fax:  [phone]  Physical Therapy Treatment  Patient Details  Name: Theresa Huff MRN: [id number] Date of Birth: 10/19/1950 Referring Provider:  Antony Contras, MD  Encounter Date: 04/15/2014      PT End of Session - 04/15/14 1706    Visit Number 4   Number of Visits 9   Date for PT Re-Evaluation 04/25/14   PT Start Time 1608   PT Stop Time 1656   PT Time Calculation (min) 48 min   Activity Tolerance Patient tolerated treatment well   Behavior During Therapy [facility] for tasks assessed/performed      Past Medical History  Diagnosis Date  . Cough   . Weight gain   . Hot flashes   . Insomnia, unspecified   . Depressive disorder, not elsewhere classified   . Allergic rhinitis due to pollen   . Plantar fasciitis   . Restless leg syndrome 09/09/2010  . Breast cancer   . Skin cancer   . OSA (obstructive sleep apnea) 09/09/2010    mild-no cpap recommended  . GERD (gastroesophageal reflux disease)   . Wears glasses   . Anemia     with pregnancy    Past Surgical History  Procedure Laterality Date  . Tonsillectomy    . Melanoma excision      right leg, left arm, right arm (several from each area)  . Colonoscopy    . Mastectomy w/ sentinel node biopsy Left 03/07/2014    Procedure: LEFT TOTAL MASTECTOMY WITH LEFT SENTINEL LYMPH NODE BIOPSY;  Surgeon: Rolm Bookbinder, MD;  Location: [facility];  Service: General;  Laterality: Left;  . Portacath placement Right 04/01/2014    Procedure: INSERTION PORT-A-CATH;  Surgeon: Rolm Bookbinder, MD;  Location: [facility];  Service: General;  Laterality: Right;    There were no vitals filed for this visit.  Visit Diagnosis:  Postoperative edema  Muscular weakness      Subjective Assessment - 04/15/14 1610    Symptoms Husband here to learn how to do manual lymph drainage today; pt. reports  white blood cell count is low so is wearing a mask today.   Currently in Pain? No/denies                       [facility] Adult PT Treatment/Exercise - 04/15/14 0001    Manual Therapy   Manual Lymphatic Drainage (MLD) In supine:  Diaphragmatic breathing, short neck, right axilla and anterior interaxillary anastomosis, left groin and axillo-inguinal anastomosis, and left UE from dorsal hand to shoulder; in right sidelying, left axillo-inguinal anastomosis and posterior interaxillary anastomosis, left to right instructing pt throughout:  instructed patient's husband in performing this, with therapist performing each step first, then her husband.                PT Education - 04/15/14 1705    Education provided Yes   Education Details Instructed patient's husband in manual lymph drainage in supine and sidelying.   Person(s) Educated Spouse   Methods Explanation;Demonstration;Tactile cues;Verbal cues   Comprehension Returned demonstration           Short Term Clinic Goals - 03/28/14 1627    CC Short Term Goal  #1   Title short term = long term           Breast Clinic Goals - 02/13/14 1043    Patient will be able  to verbalize understanding of pertinent lymphedema risk reduction practices relevant to her diagnosis specifically related to skin care.   Time 1   Period Days   Status Achieved   Patient will be able to return demonstrate and/or verbalize understanding of the post-op home exercise program related to regaining shoulder range of motion.   Time 1   Period Days   Status Achieved   Patient will be able to verbalize understanding of the importance of attending the postoperative After Breast Cancer Class for further lymphedema risk reduction education and therapeutic exercise.   Time 1   Period Days   Status Achieved          Long Term Clinic Goals - 03/28/14 1627    CC Long Term Goal  #1   Title pt will verbalize understanding of lymphedema risk  reduction practices   Time 4   Period Weeks   Status New   CC Long Term Goal  #2   Title Pt will reports 50% reduction in pain and swellling from axillary cording   Time 4   Period Weeks   Status New   CC Long Term Goal  #3   Title Pt will verbalize understanding of progressive resistance strength training   Time 4   Period Weeks   Status New   CC Long Term Goal  #4   Title pt will have decrease in .5 cm left arm circumference at 10 cm proximal to the olecranon   Time 4   Period Weeks   Status New            Plan - 04/15/14 1707    Clinical Impression Statement patient's husband did well learning manual lymph drainage, and took feedback from the patient on how much pressure to use.   Pt will benefit from skilled therapeutic intervention in order to improve on the following deficits Increased edema;Decreased knowledge of precautions;Decreased knowledge of use of DME;Decreased strength   Rehab Potential Excellent   PT Frequency 2x / week   PT Duration 4 weeks   PT Treatment/Interventions Manual lymph drainage;Patient/family education  also discussed obtaining compression sleeve:  patient wants demographics sent to [place] and assess goals.  Begin strength ABC program.  Have pt. perform self-manual lymph drainage.   PT Home Exercise Plan Patient asked about playing racquetball in the future. Recommended she start with learning strength ABC program, and progress to starting slowly with racquetball later.   Recommended Other Services Referral to [facility] for compression sleeve (suggested [facility]); send prescription to MD for signature.   Consulted and Agree with Plan of Care Patient        Problem List Patient Active Problem List   Diagnosis Date Noted  . S/P mastectomy 03/07/2014  . Breast cancer 03/07/2014  . Breast cancer of upper-outer quadrant of left female breast 02/06/2014  . OSA (obstructive sleep apnea)  09/09/2010  . Restless leg syndrome 09/09/2010  . Depressive disorder, not elsewhere classified   . Asthma, cough variant   . Insomnia, unspecified   . Allergic rhinitis due to pollen     [facility] 04/15/2014, 5:12 PM  [place], [place], [address] Phone: [phone]   Fax:  [place], PT 04/15/2014 5:12 PM

## 2014-04-17 ENCOUNTER — Ambulatory Visit: Payer: 59 | Admitting: Physical Therapy

## 2014-04-17 ENCOUNTER — Other Ambulatory Visit: Payer: 59

## 2014-04-17 ENCOUNTER — Ambulatory Visit (HOSPITAL_BASED_OUTPATIENT_CLINIC_OR_DEPARTMENT_OTHER): Payer: 59

## 2014-04-17 ENCOUNTER — Ambulatory Visit (HOSPITAL_BASED_OUTPATIENT_CLINIC_OR_DEPARTMENT_OTHER): Payer: 59 | Admitting: Nurse Practitioner

## 2014-04-17 ENCOUNTER — Ambulatory Visit: Payer: 59

## 2014-04-17 ENCOUNTER — Ambulatory Visit: Payer: 59 | Admitting: Hematology and Oncology

## 2014-04-17 ENCOUNTER — Encounter: Payer: Self-pay | Admitting: Nurse Practitioner

## 2014-04-17 ENCOUNTER — Other Ambulatory Visit (HOSPITAL_BASED_OUTPATIENT_CLINIC_OR_DEPARTMENT_OTHER): Payer: 59

## 2014-04-17 ENCOUNTER — Telehealth: Payer: Self-pay | Admitting: Hematology and Oncology

## 2014-04-17 VITALS — BP 128/74 | HR 92 | Temp 98.1°F | Resp 18 | Ht 65.0 in | Wt 153.3 lb

## 2014-04-17 DIAGNOSIS — Z5189 Encounter for other specified aftercare: Secondary | ICD-10-CM

## 2014-04-17 DIAGNOSIS — C50412 Malignant neoplasm of upper-outer quadrant of left female breast: Secondary | ICD-10-CM

## 2014-04-17 DIAGNOSIS — Z5111 Encounter for antineoplastic chemotherapy: Secondary | ICD-10-CM

## 2014-04-17 DIAGNOSIS — C773 Secondary and unspecified malignant neoplasm of axilla and upper limb lymph nodes: Secondary | ICD-10-CM

## 2014-04-17 DIAGNOSIS — R5383 Other fatigue: Secondary | ICD-10-CM | POA: Diagnosis not present

## 2014-04-17 DIAGNOSIS — D709 Neutropenia, unspecified: Secondary | ICD-10-CM | POA: Diagnosis not present

## 2014-04-17 DIAGNOSIS — D696 Thrombocytopenia, unspecified: Secondary | ICD-10-CM

## 2014-04-17 LAB — COMPREHENSIVE METABOLIC PANEL (CC13)
ALBUMIN: 3.8 g/dL (ref 3.5–5.0)
ALT: 11 U/L (ref 0–55)
AST: 11 U/L (ref 5–34)
Alkaline Phosphatase: 75 U/L (ref 40–150)
Anion Gap: 11 mEq/L (ref 3–11)
BUN: 11.9 mg/dL (ref 7.0–26.0)
CALCIUM: 9 mg/dL (ref 8.4–10.4)
CHLORIDE: 105 meq/L (ref 98–109)
CO2: 24 mEq/L (ref 22–29)
CREATININE: 0.7 mg/dL (ref 0.6–1.1)
Glucose: 90 mg/dl (ref 70–140)
Potassium: 4 mEq/L (ref 3.5–5.1)
Sodium: 141 mEq/L (ref 136–145)
Total Bilirubin: <0.2 mg/dL (ref 0.20–1.20)
Total Protein: 6.7 g/dL (ref 6.4–8.3)

## 2014-04-17 LAB — CBC WITH DIFFERENTIAL/PLATELET
BASO%: 0.9 % (ref 0.0–2.0)
Basophils Absolute: 0.1 10*3/uL (ref 0.0–0.1)
EOS ABS: 0 10*3/uL (ref 0.0–0.5)
EOS%: 0.6 % (ref 0.0–7.0)
HCT: 37.2 % (ref 34.8–46.6)
HEMOGLOBIN: 12.1 g/dL (ref 11.6–15.9)
LYMPH%: 27.2 % (ref 14.0–49.7)
MCH: 29.6 pg (ref 25.1–34.0)
MCHC: 32.6 g/dL (ref 31.5–36.0)
MCV: 90.9 fL (ref 79.5–101.0)
MONO#: 0.6 10*3/uL (ref 0.1–0.9)
MONO%: 8 % (ref 0.0–14.0)
NEUT#: 5 10*3/uL (ref 1.5–6.5)
NEUT%: 63.3 % (ref 38.4–76.8)
Platelets: 319 10*3/uL (ref 145–400)
RBC: 4.1 10*6/uL (ref 3.70–5.45)
RDW: 13.6 % (ref 11.2–14.5)
WBC: 7.9 10*3/uL (ref 3.9–10.3)
lymph#: 2.1 10*3/uL (ref 0.9–3.3)

## 2014-04-17 MED ORDER — DOXORUBICIN HCL CHEMO IV INJECTION 2 MG/ML
50.0000 mg/m2 | Freq: Once | INTRAVENOUS | Status: AC
  Administered 2014-04-17: 88 mg via INTRAVENOUS
  Filled 2014-04-17: qty 44

## 2014-04-17 MED ORDER — SODIUM CHLORIDE 0.9 % IV SOLN
Freq: Once | INTRAVENOUS | Status: AC
  Administered 2014-04-17: 14:00:00 via INTRAVENOUS

## 2014-04-17 MED ORDER — SODIUM CHLORIDE 0.9 % IJ SOLN
10.0000 mL | INTRAMUSCULAR | Status: DC | PRN
  Administered 2014-04-17: 10 mL
  Filled 2014-04-17: qty 10

## 2014-04-17 MED ORDER — PEGFILGRASTIM 6 MG/0.6ML ~~LOC~~ PSKT
6.0000 mg | PREFILLED_SYRINGE | Freq: Once | SUBCUTANEOUS | Status: AC
  Administered 2014-04-17: 6 mg via SUBCUTANEOUS
  Filled 2014-04-17: qty 0.6

## 2014-04-17 MED ORDER — HEPARIN SOD (PORK) LOCK FLUSH 100 UNIT/ML IV SOLN
500.0000 [IU] | Freq: Once | INTRAVENOUS | Status: AC | PRN
  Administered 2014-04-17: 500 [IU]
  Filled 2014-04-17: qty 5

## 2014-04-17 MED ORDER — PALONOSETRON HCL INJECTION 0.25 MG/5ML
INTRAVENOUS | Status: AC
  Filled 2014-04-17: qty 5

## 2014-04-17 MED ORDER — SODIUM CHLORIDE 0.9 % IV SOLN
500.0000 mg/m2 | Freq: Once | INTRAVENOUS | Status: AC
  Administered 2014-04-17: 880 mg via INTRAVENOUS
  Filled 2014-04-17: qty 44

## 2014-04-17 MED ORDER — PALONOSETRON HCL INJECTION 0.25 MG/5ML
0.2500 mg | Freq: Once | INTRAVENOUS | Status: AC
  Administered 2014-04-17: 0.25 mg via INTRAVENOUS

## 2014-04-17 MED ORDER — SODIUM CHLORIDE 0.9 % IV SOLN
Freq: Once | INTRAVENOUS | Status: AC
  Administered 2014-04-17: 15:00:00 via INTRAVENOUS
  Filled 2014-04-17: qty 5

## 2014-04-17 NOTE — Patient Instructions (Signed)
[facility] Discharge Instructions for Patients Receiving Chemotherapy  Today you received the following chemotherapy agents: Adriamycin (push), Cytoxan  To help prevent nausea and vomiting after your treatment, we encourage you to take your nausea medication as prescribed by your physician.   If you develop nausea and vomiting that is not controlled by your nausea medication, call the clinic.   BELOW ARE SYMPTOMS THAT SHOULD BE REPORTED IMMEDIATELY:  *FEVER GREATER THAN 100.5 F  *CHILLS WITH OR WITHOUT FEVER  NAUSEA AND VOMITING THAT IS NOT CONTROLLED WITH YOUR NAUSEA MEDICATION  *UNUSUAL SHORTNESS OF BREATH  *UNUSUAL BRUISING OR BLEEDING  TENDERNESS IN MOUTH AND THROAT WITH OR WITHOUT PRESENCE OF ULCERS  *URINARY PROBLEMS  *BOWEL PROBLEMS  UNUSUAL RASH Items with * indicate a potential emergency and should be followed up as soon as possible.  Feel free to call the clinic you have any questions or concerns. The clinic phone number is [phone].  Please show the [facility] at check-in to the Emergency Department and triage nurse.

## 2014-04-17 NOTE — Telephone Encounter (Signed)
Gave patient avs report and appointments for march and April.

## 2014-04-17 NOTE — Progress Notes (Signed)
Patient Care Team: Antony Contras, MD as PCP - General (Family Medicine) Elsie Stain, MD (Pulmonary Disease) Rolm Bookbinder, MD as Consulting Physician (General Surgery) Nicholas Lose, MD as Consulting Physician (Hematology and Oncology) [name], MD as Consulting Physician (Radiation Oncology) Rockwell Germany, RN as Registered Nurse Mauro Kaufmann, RN as Registered Nurse Holley Bouche, NP as Nurse Practitioner (Nurse Practitioner)  DIAGNOSIS: Breast cancer of upper-outer quadrant of left female breast   Staging form: Breast, AJCC 7th Edition     Clinical stage from 02/13/2014: Stage IIB (T3, N0, M0) - Unsigned       Staging comments: Staged at breast conference on 1.20.16      Pathologic stage from 03/11/2014: Stage IIB (T2, N1a, cM0) - Signed by Seward Grater, MD on 03/19/2014       Staging comments: Staged on final mastectomy specimen by Dr. Avis Epley    SUMMARY OF ONCOLOGIC HISTORY:   Breast cancer of upper-outer quadrant of left female breast   02/04/2014 Initial Diagnosis left breast: Invasive ductal carcinoma with DCIS ER 100%, PR 100%, HER-2 negative, Ki-67 ranged from 19-26%; third biopsy showed invasive mammary cancer with lobular features and intraductal papilloma, ER/PR positive HER-2 negative   02/11/2014 Breast MRI left breast: 3 masses that are confluent measuring 5.6 x 2.7 x 3 cm extends to the left aspect of area along the penis to involve skin of the area no lymph nodes detected right breast negative   03/07/2014 Surgery Left breast invasive ductal carcinoma grade 1; 4.8 cm with low-grade DCIS; margins negative 1/4 lymph nodes positive, ER 100% PR 100% HER-2 negative ratio 1.18 Ki-67 26% and 19% T2 N1 stage IIB   04/03/2014 -  Chemotherapy Adjuvant chemotherapy with dose dense Adriamycin and Cytoxan followed by weekly Abraxane 12    CHIEF COMPLIANT: Cycle 1 day 8 dose dense Adriamycin and Cytoxan  INTERVAL HISTORY: Theresa Huff is a 64 year old lady with  above-mentioned history of left breast cancer treated with lumpectomy and is now on adjuvant chemotherapy. Today cycle 2 day 1 of dose dense Adriamycin and Cytoxan. She denies fevers, chills, nausea, or vomiting. She is moving her bowels well. Her appetite is healthy despite taste changes. Her main complaint this week was fatigue. She has never felt so tired.   REVIEW OF SYSTEMS:   Constitutional: Denies fevers, chills or abnormal weight loss, extreme tiredness Eyes: Denies blurriness of vision Ears, nose, mouth, throat, and face: Denies mucositis or sore throat Respiratory: Denies cough, dyspnea or wheezes Cardiovascular: Denies palpitation, chest discomfort or lower extremity swelling Gastrointestinal:  Denies nausea, heartburn or change in bowel habits Skin: Denies abnormal skin rashes Lymphatics: Denies new lymphadenopathy or easy bruising Neurological:Denies numbness, tingling or new weaknesses Behavioral/Psych: Mood is stable, no new changes  Breast:  denies any pain or lumps or nodules in either breasts All other systems were reviewed with the patient and are negative.  I have reviewed the past medical history, past surgical history, social history and family history with the patient and they are unchanged from previous note.  ALLERGIES:  is allergic to codeine; iodine; primidone; sulfa antibiotics; and tape.  MEDICATIONS:  Current Outpatient Prescriptions  Medication Sig Dispense Refill  . calcium carbonate (OS-CAL) 600 MG TABS tablet Take 600 mg by mouth daily.    . cetirizine (ZYRTEC) 10 MG tablet Take 10 mg by mouth daily.      . Cholecalciferol (VITAMIN D PO) Take 2,000 Units by mouth daily.    Marland Kitchen  dexamethasone (DECADRON) 4 MG tablet Take 2 tablets by mouth once a day on the day after chemotherapy and then take 2 tablets two times a day for 2 days. Take with food. 30 tablet 1  . DULoxetine (CYMBALTA) 30 MG capsule     . esomeprazole (NEXIUM) 20 MG packet Take 20 mg by mouth  daily before breakfast.    . Eszopiclone (ESZOPICLONE) 3 MG TABS Take 3 mg by mouth at bedtime. Take immediately before bedtime    . lidocaine-prilocaine (EMLA) cream Apply to affected area once 30 g 3  . mometasone (NASONEX) 50 MCG/ACT nasal spray Place 2 sprays into the nose daily.      . ondansetron (ZOFRAN) 8 MG tablet Take 1 tablet (8 mg total) by mouth 2 (two) times daily as needed. Start on the third day after chemotherapy. 30 tablet 1  . Vitamin D, Ergocalciferol, (DRISDOL) 50000 UNITS CAPS capsule Take 50,000 Units by mouth every 7 (seven) days.    Marland Kitchen LORazepam (ATIVAN) 0.5 MG tablet Take 1 tablet (0.5 mg total) by mouth every 6 (six) hours as needed (Nausea or vomiting). (Patient not taking: Reported on 04/17/2014) 30 tablet 0  . ondansetron (ZOFRAN) 4 MG tablet     . oxyCODONE (OXY IR/ROXICODONE) 5 MG immediate release tablet Take 1-2 tablets (5-10 mg total) by mouth every 4 (four) hours as needed for moderate pain. (Patient not taking: Reported on 04/17/2014) 30 tablet 0  . prochlorperazine (COMPAZINE) 10 MG tablet Take 1 tablet (10 mg total) by mouth every 6 (six) hours as needed (Nausea or vomiting). (Patient not taking: Reported on 04/17/2014) 30 tablet 1   No current facility-administered medications for this visit.    PHYSICAL EXAMINATION: ECOG PERFORMANCE STATUS: 1 - Symptomatic but completely ambulatory  Filed Vitals:   04/17/14 1302  BP: 128/74  Pulse: 92  Temp: 98.1 F (36.7 C)  Resp: 18   Filed Weights   04/17/14 1302  Weight: 153 lb 4.8 oz (69.536 kg)   Skin: warm, dry  HEENT: sclerae anicteric, conjunctivae pink, oropharynx clear. No thrush or mucositis.  Lymph Nodes: No cervical or supraclavicular lymphadenopathy  Lungs: clear to auscultation bilaterally, no rales, wheezes, or rhonci  Heart: regular rate and rhythm  Abdomen: round, soft, non tender, positive bowel sounds  Musculoskeletal: No focal spinal tenderness, no peripheral edema  Neuro: non focal, well  oriented, positive affect  Breasts: deferred   LABORATORY DATA:  I have reviewed the data as listed   Chemistry      Component Value Date/Time   NA 140 04/10/2014 1001   NA 141 03/27/2014 1439   K 4.5 04/10/2014 1001   K 4.0 03/27/2014 1439   CL 108 03/27/2014 1439   CO2 26 04/10/2014 1001   CO2 28 03/27/2014 1439   BUN 10.2 04/10/2014 1001   BUN 9 03/27/2014 1439   CREATININE 0.7 04/10/2014 1001   CREATININE 0.78 03/27/2014 1439      Component Value Date/Time   CALCIUM 8.8 04/10/2014 1001   CALCIUM 9.5 03/27/2014 1439   ALKPHOS 71 04/10/2014 1001   ALKPHOS 76 09/14/2010 1708   AST <7 04/10/2014 1001   AST 18 09/14/2010 1708   ALT 9 04/10/2014 1001   ALT 21 09/14/2010 1708   BILITOT 0.52 04/10/2014 1001   BILITOT 0.4 09/14/2010 1708       Lab Results  Component Value Date   WBC 7.9 04/17/2014   HGB 12.1 04/17/2014   HCT 37.2 04/17/2014   MCV  90.9 04/17/2014   PLT 319 04/17/2014   NEUTROABS 5.0 04/17/2014     ASSESSMENT & PLAN:   Breast cancer of upper-outer quadrant of left female breast Left breast invasive ductal carcinoma grade 1; 4.8 cm with low-grade DCIS; margins negative 1/4 lymph nodes positive, ER 100% PR 100% HER-2 negative ratio 1.18 Ki-67 26% and 19% T2 N1 stage IIB  Treatment plan:  1. Systemic chemotherapy with dose dense Adriamycin and Cytoxan 4 followed by Abraxane weekly 12 2. Followed by adjuvant radiation 3. Followed by adjuvant antiestrogen therapy with anastrozole for 5 years  Current treatment: Cycle 2 day 1, dose dense Adriamycin and Cytoxan  Chemotherapy toxicities: 1. Profound fatigue for 2-3 days after chemotherapy starting day 4 2. Grade 4 neutropenia: We'll decrease the dosage of cycle 2 chemotherapy of Adriamycin 50 mg/m and Cytoxan 500 mg/m 3. Thrombocytopenia grade 1  The labs were reviewed in detail and were entirely normal this week. Her [place] has risen to 5.0. She will proceed with cycle 2 of treatment as planned  today.   She will continue to manage her nausea and bowels on her own, paying particular attention to fluid volume intake. She will rest when necessary.   [place] will return next week for labs and a nadir visit.   No orders of the defined types were placed in this encounter.   The patient has a good understanding of the overall plan. she agrees with it. She will call with any problems that may develop before her next visit here.   Laurie Panda, NP

## 2014-04-18 ENCOUNTER — Ambulatory Visit: Payer: 59

## 2014-04-18 DIAGNOSIS — M439 Deforming dorsopathy, unspecified: Secondary | ICD-10-CM

## 2014-04-18 DIAGNOSIS — R609 Edema, unspecified: Secondary | ICD-10-CM

## 2014-04-18 DIAGNOSIS — M6281 Muscle weakness (generalized): Secondary | ICD-10-CM

## 2014-04-18 DIAGNOSIS — C50912 Malignant neoplasm of unspecified site of left female breast: Secondary | ICD-10-CM

## 2014-04-18 NOTE — Therapy (Signed)
[place], [place], [address] Phone: [phone]   Fax:  [phone]  Physical Therapy Treatment  Patient Details  Name: Theresa Huff MRN: [id number] Date of Birth: 1950/07/20 Referring Provider:  Antony Contras, MD  Encounter Date: 04/18/2014      PT End of Session - 04/18/14 1334    Visit Number 5   Number of Visits 9   Date for PT Re-Evaluation 04/25/14   PT Start Time 7096   PT Stop Time 2836   PT Time Calculation (min) 44 min      Past Medical History  Diagnosis Date  . Cough   . Weight gain   . Hot flashes   . Insomnia, unspecified   . Depressive disorder, not elsewhere classified   . Allergic rhinitis due to pollen   . Plantar fasciitis   . Restless leg syndrome 09/09/2010  . Breast cancer   . Skin cancer   . OSA (obstructive sleep apnea) 09/09/2010    mild-no cpap recommended  . GERD (gastroesophageal reflux disease)   . Wears glasses   . Anemia     with pregnancy    Past Surgical History  Procedure Laterality Date  . Tonsillectomy    . Melanoma excision      right leg, left arm, right arm (several from each area)  . Colonoscopy    . Mastectomy w/ sentinel node biopsy Left 03/07/2014    Procedure: LEFT TOTAL MASTECTOMY WITH LEFT SENTINEL LYMPH NODE BIOPSY;  Surgeon: Rolm Bookbinder, MD;  Location: [facility];  Service: General;  Laterality: Left;  . Portacath placement Right 04/01/2014    Procedure: INSERTION PORT-A-CATH;  Surgeon: Rolm Bookbinder, MD;  Location: [facility];  Service: General;  Laterality: Right;    There were no vitals filed for this visit.  Visit Diagnosis:  Postoperative edema  Muscular weakness  Breast cancer, left  Postural deformity      Subjective Assessment - 04/18/14 1311    Symptoms Last session went well. My blood count was back to normal yesterday so I had another chemo treatment.    Currently in Pain? No/denies                        [facility] Adult PT Treatment/Exercise - 04/18/14 0001    Shoulder Exercises: ROM/Strengthening   Other ROM/Strengthening Exercises All Stretches from Strength ABC Program: Bil chest, shoulder, tricep, calf, seated hamstring and piriformis figure 4 stretch, supine low trunk rotation all 1 rep and 15 second holds   Other ROM/Strengthening Exercises All Strength from Strength ABC Program: Bridging, bil S/L clam, chest press 2 lbs, quadruped alternate UE/LE 5 reps, squats 1 lb UE lifts, rows with chair 2 lbs, bil hip abduction with fingertips on wall, scaption against wall 2 lbs, 6" step ups, tricep kickbacks 2 lbs, calf raises, and bicep curls 2 lbs all 10 reps unless otherwise noted. Pt with excellent technique as she used to work out with a Clinical research associate before diagnosis.                 PT Education - 04/18/14 1333    Education provided Yes   Education Details Strength ABC Program   Person(s) Educated Patient   Methods Explanation;Demonstration   Comprehension Verbalized understanding;Returned demonstration           Short Term Clinic Goals - 03/28/14 1627    CC Short Term Goal  #1   Title  short term = long term           Breast Clinic Goals - 02/13/14 1043    Patient will be able to verbalize understanding of pertinent lymphedema risk reduction practices relevant to her diagnosis specifically related to skin care.   Time 1   Period Days   Status Achieved   Patient will be able to return demonstrate and/or verbalize understanding of the post-op home exercise program related to regaining shoulder range of motion.   Time 1   Period Days   Status Achieved   Patient will be able to verbalize understanding of the importance of attending the postoperative After Breast Cancer Class for further lymphedema risk reduction education and therapeutic exercise.   Time 1   Period Days   Status Achieved          Long Term Clinic Goals - 04/18/14 1351     CC Long Term Goal  #1   Title pt will verbalize understanding of lymphedema risk reduction practices   Status On-going   CC Long Term Goal  #3   Title Pt will verbalize understanding of progressive resistance strength training   Status On-going            Plan - 04/18/14 1335    Clinical Impression Statement Pt did excellent with learning Strength ABC Program demonstrating proper technique or with minimal cuing to learn a new exercise, though pt was familiar with alot of the exercises.  Advised pt to wait at least until end of April to play racquetball so she can do Strength ABC Program for a few weeks increasing muscle endurance and lyphatic load tolerance.     Pt will benefit from skilled therapeutic intervention in order to improve on the following deficits Increased edema;Decreased knowledge of precautions;Decreased knowledge of use of DME;Decreased strength   Rehab Potential Excellent   Clinical Impairments Affecting Rehab Potential none   PT Frequency 2x / week   PT Duration 4 weeks   PT Treatment/Interventions Manual lymph drainage;Patient/family education   PT Next Visit Plan Review strength ABC program prn.  Have pt. perform self-manual lymph drainage.   Consulted and Agree with Plan of Care Patient        Problem List Patient Active Problem List   Diagnosis Date Noted  . S/P mastectomy 03/07/2014  . Breast cancer 03/07/2014  . Breast cancer of upper-outer quadrant of left female breast 02/06/2014  . OSA (obstructive sleep apnea) 09/09/2010  . Restless leg syndrome 09/09/2010  . Depressive disorder, not elsewhere classified   . Asthma, cough variant   . Insomnia, unspecified   . Allergic rhinitis due to pollen     Otelia Limes, PTA 04/18/2014, 1:52 PM  [place], [place], [address] Phone: [phone]   Fax:  [phone]

## 2014-04-22 ENCOUNTER — Ambulatory Visit: Payer: 59 | Admitting: Physical Therapy

## 2014-04-22 DIAGNOSIS — R609 Edema, unspecified: Secondary | ICD-10-CM

## 2014-04-22 DIAGNOSIS — M439 Deforming dorsopathy, unspecified: Secondary | ICD-10-CM | POA: Diagnosis not present

## 2014-04-22 NOTE — Therapy (Signed)
[place], [place], [address] Phone: [phone]   Fax:  [phone]  Physical Therapy Treatment  Patient Details  Name: Theresa Huff MRN: [id number] Date of Birth: Mar 13, 1950 Referring Provider:  Antony Contras, MD  Encounter Date: 04/22/2014      PT End of Session - 04/22/14 1655    Visit Number 6   Number of Visits 9   Date for PT Re-Evaluation 04/25/14   PT Start Time 1602   PT Stop Time 1641   PT Time Calculation (min) 39 min   Activity Tolerance Patient tolerated treatment well   Behavior During Therapy [facility] for tasks assessed/performed      Past Medical History  Diagnosis Date  . Cough   . Weight gain   . Hot flashes   . Insomnia, unspecified   . Depressive disorder, not elsewhere classified   . Allergic rhinitis due to pollen   . Plantar fasciitis   . Restless leg syndrome 09/09/2010  . Breast cancer   . Skin cancer   . OSA (obstructive sleep apnea) 09/09/2010    mild-no cpap recommended  . GERD (gastroesophageal reflux disease)   . Wears glasses   . Anemia     with pregnancy    Past Surgical History  Procedure Laterality Date  . Tonsillectomy    . Melanoma excision      right leg, left arm, right arm (several from each area)  . Colonoscopy    . Mastectomy w/ sentinel node biopsy Left 03/07/2014    Procedure: LEFT TOTAL MASTECTOMY WITH LEFT SENTINEL LYMPH NODE BIOPSY;  Surgeon: Rolm Bookbinder, MD;  Location: [place];  Service: General;  Laterality: Left;  . Portacath placement Right 04/01/2014    Procedure: INSERTION PORT-A-CATH;  Surgeon: Rolm Bookbinder, MD;  Location: [place];  Service: General;  Laterality: Right;    There were no vitals filed for this visit.  Visit Diagnosis:  Postoperative edema      Subjective Assessment - 04/22/14 1608    Symptoms Went ahead and shaved my head.  Didn't have a chance to have my husband do manual lymph drainage  over the weekend.  No questions on exercise.   Currently in Pain? Yes   Pain Score 4    Pain Location Other (Comment)  upper trap area   Pain Orientation Right;Left   Pain Descriptors / Indicators Sore   Aggravating Factors  pressure on the area   Pain Relieving Factors rest                       [facility] Adult PT Treatment/Exercise - 04/22/14 0001    Manual Therapy   Manual Lymphatic Drainage (MLD) In supine, short neck, superficial and deep abdomen, right axilla and anterior interaxillary anastomosis, left groin and axillo-inguinal anastomosis, and left UE from fingers to shoulder; in right sidelying, posterior interaxillary anastomosis left to right and left axillo-inguinal anastomosis.                   Short Term Clinic Goals - 03/28/14 1627    CC Short Term Goal  #1   Title short term = long term           Breast Clinic Goals - 02/13/14 1043    Patient will be able to verbalize understanding of pertinent lymphedema risk reduction practices relevant to her diagnosis specifically related to skin care.   Time 1   Period Days  Status Achieved   Patient will be able to return demonstrate and/or verbalize understanding of the post-op home exercise program related to regaining shoulder range of motion.   Time 1   Period Days   Status Achieved   Patient will be able to verbalize understanding of the importance of attending the postoperative After Breast Cancer Class for further lymphedema risk reduction education and therapeutic exercise.   Time 1   Period Days   Status Achieved          Long Term Clinic Goals - 04/18/14 1351    CC Long Term Goal  #1   Title pt will verbalize understanding of lymphedema risk reduction practices   Status On-going   CC Long Term Goal  #3   Title Pt will verbalize understanding of progressive resistance strength training   Status On-going            Plan - 04/22/14 1656    Clinical Impression Statement Pt.  requested manual lymph drainage today because she hadn't had time to do it over the holiday weekend.  She pointed out a blister at web of thumb on left hand from the TG soft, so she has stopped putting her thumb in the thumb holde.    She did not feel a need to review stregth ABC program.   Pt will benefit from skilled therapeutic intervention in order to improve on the following deficits Increased edema;Decreased knowledge of precautions;Decreased knowledge of use of DME;Decreased strength   Rehab Potential Excellent   PT Treatment/Interventions Manual lymph drainage;Patient/family education   PT Next Visit Plan Have patient perform self-manual lymph drainage.  Reassess, checkc goals.   Recommended Other Services Prescription and info have been faxed to [facility] re: compression sleeve.        Problem List Patient Active Problem List   Diagnosis Date Noted  . S/P mastectomy 03/07/2014  . Breast cancer 03/07/2014  . Breast cancer of upper-outer quadrant of left female breast 02/06/2014  . OSA (obstructive sleep apnea) 09/09/2010  . Restless leg syndrome 09/09/2010  . Depressive disorder, not elsewhere classified   . Asthma, cough variant   . Insomnia, unspecified   . Allergic rhinitis due to pollen     [facility] 04/22/2014, 4:59 PM  [place], [place], [address] Phone: [phone]   Fax:  [place], PT 04/22/2014 4:59 PM

## 2014-04-24 ENCOUNTER — Telehealth: Payer: Self-pay | Admitting: Hematology and Oncology

## 2014-04-24 ENCOUNTER — Ambulatory Visit (HOSPITAL_BASED_OUTPATIENT_CLINIC_OR_DEPARTMENT_OTHER): Payer: 59 | Admitting: Hematology and Oncology

## 2014-04-24 ENCOUNTER — Ambulatory Visit: Payer: 59 | Admitting: Physical Therapy

## 2014-04-24 ENCOUNTER — Other Ambulatory Visit (HOSPITAL_BASED_OUTPATIENT_CLINIC_OR_DEPARTMENT_OTHER): Payer: 59

## 2014-04-24 VITALS — BP 114/64 | HR 76 | Temp 98.4°F | Resp 18 | Ht 65.0 in | Wt 151.6 lb

## 2014-04-24 DIAGNOSIS — D696 Thrombocytopenia, unspecified: Secondary | ICD-10-CM | POA: Diagnosis not present

## 2014-04-24 DIAGNOSIS — M439 Deforming dorsopathy, unspecified: Secondary | ICD-10-CM | POA: Diagnosis not present

## 2014-04-24 DIAGNOSIS — R5383 Other fatigue: Secondary | ICD-10-CM | POA: Diagnosis not present

## 2014-04-24 DIAGNOSIS — R609 Edema, unspecified: Secondary | ICD-10-CM

## 2014-04-24 DIAGNOSIS — Z170 Estrogen receptor positive status [ER+]: Secondary | ICD-10-CM | POA: Diagnosis not present

## 2014-04-24 DIAGNOSIS — C50412 Malignant neoplasm of upper-outer quadrant of left female breast: Secondary | ICD-10-CM

## 2014-04-24 LAB — COMPREHENSIVE METABOLIC PANEL (CC13)
ALT: 12 U/L (ref 0–55)
ANION GAP: 6 meq/L (ref 3–11)
AST: 8 U/L (ref 5–34)
Albumin: 3.5 g/dL (ref 3.5–5.0)
Alkaline Phosphatase: 107 U/L (ref 40–150)
BILIRUBIN TOTAL: 0.69 mg/dL (ref 0.20–1.20)
BUN: 9.5 mg/dL (ref 7.0–26.0)
CO2: 28 mEq/L (ref 22–29)
Calcium: 9 mg/dL (ref 8.4–10.4)
Chloride: 107 mEq/L (ref 98–109)
Creatinine: 0.7 mg/dL (ref 0.6–1.1)
EGFR: >90 mL/min/{1.73_m2} (ref >90)
Glucose: 75 mg/dl (ref 70–140)
POTASSIUM: 4.9 meq/L (ref 3.5–5.1)
Sodium: 141 mEq/L (ref 136–145)
TOTAL PROTEIN: 6.1 g/dL — AB (ref 6.4–8.3)

## 2014-04-24 LAB — CBC WITH DIFFERENTIAL/PLATELET
BASO%: 1.6 % (ref 0.0–2.0)
Basophils Absolute: 0 10*3/uL (ref 0.0–0.1)
EOS ABS: 0 10*3/uL (ref 0.0–0.5)
EOS%: 1.2 % (ref 0.0–7.0)
HCT: 35.9 % (ref 34.8–46.6)
HGB: 11.6 g/dL (ref 11.6–15.9)
LYMPH%: 33.5 % (ref 14.0–49.7)
MCH: 30.2 pg (ref 25.1–34.0)
MCHC: 32.3 g/dL (ref 31.5–36.0)
MCV: 93.5 fL (ref 79.5–101.0)
MONO#: 0.2 10*3/uL (ref 0.1–0.9)
MONO%: 7 % (ref 0.0–14.0)
NEUT%: 56.7 % (ref 38.4–76.8)
NEUTROS ABS: 1.5 10*3/uL (ref 1.5–6.5)
PLATELETS: 207 10*3/uL (ref 145–400)
RBC: 3.84 10*6/uL (ref 3.70–5.45)
RDW: 14 % (ref 11.2–14.5)
WBC: 2.6 10*3/uL — ABNORMAL LOW (ref 3.9–10.3)
lymph#: 0.9 10*3/uL (ref 0.9–3.3)

## 2014-04-24 NOTE — Telephone Encounter (Signed)
per pof to sch pt appt-gave pt copy of sch-did referral for [facility]

## 2014-04-24 NOTE — Progress Notes (Signed)
Patient Care Team: Antony Contras, MD as PCP - General (Family Medicine) Elsie Stain, MD (Pulmonary Disease) Rolm Bookbinder, MD as Consulting Physician (General Surgery) Nicholas Lose, MD as Consulting Physician (Hematology and Oncology) [name], MD as Consulting Physician (Radiation Oncology) Rockwell Germany, RN as Registered Nurse Mauro Kaufmann, RN as Registered Nurse Holley Bouche, NP as Nurse Practitioner (Nurse Practitioner)  DIAGNOSIS: Breast cancer of upper-outer quadrant of left female breast   Staging form: Breast, AJCC 7th Edition     Clinical stage from 02/13/2014: Stage IIB (T3, N0, M0) - Unsigned       Staging comments: Staged at breast conference on 1.20.16      Pathologic stage from 03/11/2014: Stage IIB (T2, N1a, cM0) - Signed by Seward Grater, MD on 03/19/2014       Staging comments: Staged on final mastectomy specimen by Dr. Avis Epley    SUMMARY OF ONCOLOGIC HISTORY:   Breast cancer of upper-outer quadrant of left female breast   02/04/2014 Initial Diagnosis left breast: Invasive ductal carcinoma with DCIS ER 100%, PR 100%, HER-2 negative, Ki-67 ranged from 19-26%; third biopsy showed invasive mammary cancer with lobular features and intraductal papilloma, ER/PR positive HER-2 negative   02/11/2014 Breast MRI left breast: 3 masses that are confluent measuring 5.6 x 2.7 x 3 cm extends to the left aspect of area along the penis to involve skin of the area no lymph nodes detected right breast negative   03/07/2014 Surgery Left breast invasive ductal carcinoma grade 1; 4.8 cm with low-grade DCIS; margins negative 1/4 lymph nodes positive, ER 100% PR 100% HER-2 negative ratio 1.18 Ki-67 26% and 19% T2 N1 stage IIB   04/03/2014 -  Chemotherapy Adjuvant chemotherapy with dose dense Adriamycin and Cytoxan followed by weekly Abraxane 12    CHIEF COMPLIANT: Cycle 2 day 8 Adriamycin Cytoxan  INTERVAL HISTORY: Theresa Huff is a 64 year old with above-mentioned history  of left-sided breast cancer treated with left mastectomy and is currently on adjuvant chemotherapy. today is cycle 2 day 8 of Adriamycin and Cytoxan. Cycle 2 she tolerated much better. We had reduced the dosage of chemotherapy after cycle 1 which caused her profound fatigue and terms of opinion neutropenia. Today she is not neutropenic and has an [place] of 1500. She has much better energy levels. Did not have any nausea vomiting. She is quite sad that she lost her hair.  REVIEW OF SYSTEMS:   Constitutional: Denies fevers, chills or abnormal weight loss Eyes: Denies blurriness of vision Ears, nose, mouth, throat, and face: Denies mucositis or sore throat Respiratory: Denies cough, dyspnea or wheezes Cardiovascular: Denies palpitation, chest discomfort or lower extremity swelling Gastrointestinal:  Denies nausea, heartburn or change in bowel habits Skin: Denies abnormal skin rashes Lymphatics: Denies new lymphadenopathy or easy bruising Neurological:Denies numbness, tingling or new weaknesses Behavioral/Psych: Mood is stable, no new changes  Breast:  denies any pain or lumps or nodules in either breasts All other systems were reviewed with the patient and are negative.  I have reviewed the past medical history, past surgical history, social history and family history with the patient and they are unchanged from previous note.  ALLERGIES:  is allergic to codeine; iodine; primidone; sulfa antibiotics; and tape.  MEDICATIONS:  Current Outpatient Prescriptions  Medication Sig Dispense Refill  . calcium carbonate (OS-CAL) 600 MG TABS tablet Take 600 mg by mouth daily.    . cetirizine (ZYRTEC) 10 MG tablet Take 10 mg by mouth daily.      Marland Kitchen  Cholecalciferol (VITAMIN D PO) Take 2,000 Units by mouth daily.    Marland Kitchen dexamethasone (DECADRON) 4 MG tablet Take 2 tablets by mouth once a day on the day after chemotherapy and then take 2 tablets two times a day for 2 days. Take with food. 30 tablet 1  . DULoxetine  (CYMBALTA) 30 MG capsule     . esomeprazole (NEXIUM) 20 MG packet Take 20 mg by mouth daily before breakfast.    . Eszopiclone (ESZOPICLONE) 3 MG TABS Take 3 mg by mouth at bedtime. Take immediately before bedtime    . lidocaine-prilocaine (EMLA) cream Apply to affected area once 30 g 3  . LORazepam (ATIVAN) 0.5 MG tablet Take 1 tablet (0.5 mg total) by mouth every 6 (six) hours as needed (Nausea or vomiting). 30 tablet 0  . mometasone (NASONEX) 50 MCG/ACT nasal spray Place 2 sprays into the nose daily.      . ondansetron (ZOFRAN) 4 MG tablet     . ondansetron (ZOFRAN) 8 MG tablet Take 1 tablet (8 mg total) by mouth 2 (two) times daily as needed. Start on the third day after chemotherapy. 30 tablet 1  . oxyCODONE (OXY IR/ROXICODONE) 5 MG immediate release tablet Take 1-2 tablets (5-10 mg total) by mouth every 4 (four) hours as needed for moderate pain. 30 tablet 0  . prochlorperazine (COMPAZINE) 10 MG tablet Take 1 tablet (10 mg total) by mouth every 6 (six) hours as needed (Nausea or vomiting). 30 tablet 1  . Vitamin D, Ergocalciferol, (DRISDOL) 50000 UNITS CAPS capsule Take 50,000 Units by mouth every 7 (seven) days.     No current facility-administered medications for this visit.    PHYSICAL EXAMINATION: ECOG PERFORMANCE STATUS: 1 - Symptomatic but completely ambulatory  Filed Vitals:   04/24/14 0853  BP: 114/64  Pulse: 76  Temp: 98.4 F (36.9 C)  Resp: 18   Filed Weights   04/24/14 0853  Weight: 151 lb 9.6 oz (68.765 kg)    GENERAL:alert, no distress and comfortable SKIN: skin color, texture, turgor are normal, no rashes or significant lesions EYES: normal, Conjunctiva are pink and non-injected, sclera clear OROPHARYNX:no exudate, no erythema and lips, buccal mucosa, and tongue normal  NECK: supple, thyroid normal size, non-tender, without nodularity LYMPH:  no palpable lymphadenopathy in the cervical, axillary or inguinal LUNGS: clear to auscultation and percussion with  normal breathing effort HEART: regular rate & rhythm and no murmurs and no lower extremity edema ABDOMEN:abdomen soft, non-tender and normal bowel sounds Musculoskeletal:no cyanosis of digits and no clubbing  NEURO: alert & oriented x 3 with fluent speech, no focal motor/sensory deficits  LABORATORY DATA:  I have reviewed the data as listed   Chemistry      Component Value Date/Time   NA 141 04/24/2014 0811   NA 141 03/27/2014 1439   K 4.9 04/24/2014 0811   K 4.0 03/27/2014 1439   CL 108 03/27/2014 1439   CO2 28 04/24/2014 0811   CO2 28 03/27/2014 1439   BUN 9.5 04/24/2014 0811   BUN 9 03/27/2014 1439   CREATININE 0.7 04/24/2014 0811   CREATININE 0.78 03/27/2014 1439      Component Value Date/Time   CALCIUM 9.0 04/24/2014 0811   CALCIUM 9.5 03/27/2014 1439   ALKPHOS 107 04/24/2014 0811   ALKPHOS 76 09/14/2010 1708   AST 8 04/24/2014 0811   AST 18 09/14/2010 1708   ALT 12 04/24/2014 0811   ALT 21 09/14/2010 1708   BILITOT 0.69 04/24/2014 1610  BILITOT 0.4 09/14/2010 1708       Lab Results  Component Value Date   WBC 2.6* 04/24/2014   HGB 11.6 04/24/2014   HCT 35.9 04/24/2014   MCV 93.5 04/24/2014   PLT 207 04/24/2014   NEUTROABS 1.5 04/24/2014     ASSESSMENT & PLAN:  Breast cancer of upper-outer quadrant of left female breast Left breast invasive ductal carcinoma grade 1; 4.8 cm with low-grade DCIS; margins negative 1/4 lymph nodes positive, ER 100% PR 100% HER-2 negative ratio 1.18 Ki-67 26% and 19% T2 N1 stage IIB  Treatment plan:  1. Systemic chemotherapy with dose dense Adriamycin and Cytoxan 4 followed by Abraxane weekly 12 2. Followed by adjuvant radiation 3. Followed by adjuvant antiestrogen therapy with anastrozole for 5 years  Current treatment: Cycle 2 day 8 dose dense Adriamycin and Cytoxan  Chemotherapy toxicities: 1. Profound fatigue for 2-3 days after chemotherapy starting day 4 2. Grade 4 neutropenia: decreased the dosage of cycle 2  chemotherapy of Adriamycin 50 mg/m and Cytoxan 500 mg/m 3. Thrombocytopenia grade 1  I reviewed her blood work from today. Her ANC is 1500. Her platelet counts are also normal. So she is tolerating this current dosage very well. We will keep her at this dosage level. She will not need to come the week after for nadir count checks.  Closely monitoring for chemotherapy toxicities. Return to clinic in 1 weeks for cycle 3    Orders Placed This Encounter  Procedures  . Ambulatory referral to Radiation Oncology    Referral Priority:  Routine    Referral Type:  Consultation    Referral Reason:  Specialty Services Required    Referred to Provider:  [name], MD    Requested Specialty:  Radiation Oncology    Number of Visits Requested:  1   The patient has a good understanding of the overall plan. she agrees with it. She will call with any problems that may develop before her next visit here.   Rulon Eisenmenger, MD

## 2014-04-24 NOTE — Assessment & Plan Note (Addendum)
Left breast invasive ductal carcinoma grade 1; 4.8 cm with low-grade DCIS; margins negative 1/4 lymph nodes positive, ER 100% PR 100% HER-2 negative ratio 1.18 Ki-67 26% and 19% T2 N1 stage IIB  Treatment plan:  1. Systemic chemotherapy with dose dense Adriamycin and Cytoxan 4 followed by Abraxane weekly 12 2. Followed by adjuvant radiation 3. Followed by adjuvant antiestrogen therapy with anastrozole for 5 years  Current treatment: Cycle 2 day 8 dose dense Adriamycin and Cytoxan  Chemotherapy toxicities: 1. Profound fatigue for 2-3 days after chemotherapy starting day 4 2. Grade 4 neutropenia: decreased the dosage of cycle 2 chemotherapy of Adriamycin 50 mg/m and Cytoxan 500 mg/m 3. Thrombocytopenia grade 1  I reviewed her blood work from today. Her ANC is 1500. Her platelet counts are also normal. So she is tolerating this current dosage very well. We will keep her at this dosage level. She will not need to come the week after for nadir count checks.  Closely monitoring for chemotherapy toxicities. Return to clinic in 1 weeks for cycle 3

## 2014-04-24 NOTE — Therapy (Signed)
[place], [place], [address] Phone: [phone]   Fax:  [phone]  Physical Therapy Treatment  Patient Details  Name: Theresa Huff MRN: [id number] Date of Birth: 04/30/1950 Referring Provider:  Antony Contras, MD  Encounter Date: 04/24/2014      PT End of Session - 04/24/14 1658    Visit Number 7   Number of Visits 9   Date for PT Re-Evaluation 05/02/14   PT Start Time 1245   PT Stop Time 1646   PT Time Calculation (min) 41 min   Activity Tolerance Patient tolerated treatment well   Behavior During Therapy [facility] for tasks assessed/performed      Past Medical History  Diagnosis Date  . Cough   . Weight gain   . Hot flashes   . Insomnia, unspecified   . Depressive disorder, not elsewhere classified   . Allergic rhinitis due to pollen   . Plantar fasciitis   . Restless leg syndrome 09/09/2010  . Breast cancer   . Skin cancer   . OSA (obstructive sleep apnea) 09/09/2010    mild-no cpap recommended  . GERD (gastroesophageal reflux disease)   . Wears glasses   . Anemia     with pregnancy    Past Surgical History  Procedure Laterality Date  . Tonsillectomy    . Melanoma excision      right leg, left arm, right arm (several from each area)  . Colonoscopy    . Mastectomy w/ sentinel node biopsy Left 03/07/2014    Procedure: LEFT TOTAL MASTECTOMY WITH LEFT SENTINEL LYMPH NODE BIOPSY;  Surgeon: Rolm Bookbinder, MD;  Location: [place];  Service: General;  Laterality: Left;  . Portacath placement Right 04/01/2014    Procedure: INSERTION PORT-A-CATH;  Surgeon: Rolm Bookbinder, MD;  Location: [place];  Service: General;  Laterality: Right;    There were no vitals filed for this visit.  Visit Diagnosis:  Postoperative edema      Subjective Assessment - 04/24/14 1608    Symptoms Got measured for a sleeve today; it'll be a couple of weeks.  Had an appointment with Dr. Lindi Adie  today and he said everything looks good.  Will meet with radiation oncologist on the 14th.   Currently in Pain? No/denies               LYMPHEDEMA/ONCOLOGY QUESTIONNAIRE - 04/24/14 1612    Left Upper Extremity Lymphedema   15 cm Proximal to Olecranon Process 29.6 cm   10 cm Proximal to Olecranon Process 29.1 cm   Olecranon Process 23.6 cm   15 cm Proximal to Ulnar Styloid Process 22.2 cm   10 cm Proximal to Ulnar Styloid Process 20.5 cm   Just Proximal to Ulnar Styloid Process 15.3 cm   Across Hand at [employer] 17.4 cm   At [place] of 2nd Digit 5.6 cm                [facility] Adult PT Treatment/Exercise - 04/24/14 0001    Manual Therapy   Edema Management circumference measurements taken   Manual Lymphatic Drainage (MLD) In supine, diaphragmatic breathing, then instructed patient in performing short neck, right axilla and anterior interaxillary anastomosis, left groin and axillo-inguinal anastomosis, and left UE from dorsal hand to shoulder.                PT Education - 04/24/14 1657    Education provided Yes   Education Details self-manual lymph  drainage   Person(s) Educated Patient   Methods Explanation;Tactile cues;Verbal cues   Comprehension Returned demonstration           Short Term Clinic Goals - 03/28/14 1627    CC Short Term Goal  #1   Title short term = long term           Breast Clinic Goals - 02/13/14 1043    Patient will be able to verbalize understanding of pertinent lymphedema risk reduction practices relevant to her diagnosis specifically related to skin care.   Time 1   Period Days   Status Achieved   Patient will be able to return demonstrate and/or verbalize understanding of the post-op home exercise program related to regaining shoulder range of motion.   Time 1   Period Days   Status Achieved   Patient will be able to verbalize understanding of the importance of attending the postoperative After Breast Cancer Class for  further lymphedema risk reduction education and therapeutic exercise.   Time 1   Period Days   Status Achieved          Long Term Clinic Goals - 04/24/14 1700    CC Long Term Goal  #3   Status On-going   CC Long Term Goal  #4   Status On-going            Plan - 04/24/14 1658    Clinical Impression Statement Pt., who had been part of her husband's instruction in manual lymph drainage, did well with learning it herself today; reductions in circumference measurements noted.   Pt will benefit from skilled therapeutic intervention in order to improve on the following deficits Increased edema;Decreased knowledge of precautions;Decreased knowledge of use of DME;Decreased strength   Rehab Potential Excellent   PT Frequency 2x / week   PT Duration 4 weeks   PT Treatment/Interventions Manual lymph drainage;Patient/family education   PT Next Visit Plan Review self-manual lymph drainage as needed; review strength ABC program.  Probable discharge 05/02/14.   Consulted and Agree with Plan of Care Patient        Problem List Patient Active Problem List   Diagnosis Date Noted  . S/P mastectomy 03/07/2014  . Breast cancer 03/07/2014  . Breast cancer of upper-outer quadrant of left female breast 02/06/2014  . OSA (obstructive sleep apnea) 09/09/2010  . Restless leg syndrome 09/09/2010  . Depressive disorder, not elsewhere classified   . Asthma, cough variant   . Insomnia, unspecified   . Allergic rhinitis due to pollen     [facility] 04/24/2014, 5:02 PM  [place], [place], [address] Phone: [phone]   Fax:  [place], PT 04/24/2014 5:02 PM

## 2014-04-29 ENCOUNTER — Encounter: Payer: Self-pay | Admitting: Hematology and Oncology

## 2014-04-29 ENCOUNTER — Ambulatory Visit: Payer: 59 | Attending: General Surgery | Admitting: Physical Therapy

## 2014-04-29 DIAGNOSIS — R609 Edema, unspecified: Secondary | ICD-10-CM

## 2014-04-29 DIAGNOSIS — C50912 Malignant neoplasm of unspecified site of left female breast: Secondary | ICD-10-CM | POA: Insufficient documentation

## 2014-04-29 DIAGNOSIS — M439 Deforming dorsopathy, unspecified: Secondary | ICD-10-CM | POA: Insufficient documentation

## 2014-04-29 DIAGNOSIS — M6281 Muscle weakness (generalized): Secondary | ICD-10-CM

## 2014-04-29 NOTE — Telephone Encounter (Signed)
LMOVM - If no other signs of infection, sx she is reporting probably allergies.  If she has temp, other symptoms, drainage or coughing up colored mucous - pt to call clinic as may be infection that would need to be treated so as not to delay treatment. Recommended pt have Dr. Lindi Adie look at her arm when she is on on Wed since she will be here anyway, can see dermatologist after if needed.  Pt to call clinic if she has any questions.

## 2014-04-29 NOTE — Therapy (Signed)
[place], [place], [address] Phone: [phone]   Fax:  [phone]  Physical Therapy Treatment  Patient Details  Name: Theresa Huff MRN: [id number] Date of Birth: [birth year] Referring Provider:  Antony Contras, MD  Encounter Date: 04/29/2014      PT End of Session - 04/29/14 1653    Visit Number 8   Number of Visits 9   Date for PT Re-Evaluation 05/02/14   PT Start Time 1600   PT Stop Time 1642   PT Time Calculation (min) 42 min      Past Medical History  Diagnosis Date  . Cough   . Weight gain   . Hot flashes   . Insomnia, unspecified   . Depressive disorder, not elsewhere classified   . Allergic rhinitis due to pollen   . Plantar fasciitis   . Restless leg syndrome 09/09/2010  . Breast cancer   . Skin cancer   . OSA (obstructive sleep apnea) 09/09/2010    mild-no cpap recommended  . GERD (gastroesophageal reflux disease)   . Wears glasses   . Anemia     with pregnancy    Past Surgical History  Procedure Laterality Date  . Tonsillectomy    . Melanoma excision      right leg, left arm, right arm (several from each area)  . Colonoscopy    . Mastectomy w/ sentinel node biopsy Left 03/07/2014    Procedure: LEFT TOTAL MASTECTOMY WITH LEFT SENTINEL LYMPH NODE BIOPSY;  Surgeon: Rolm Bookbinder, MD;  Location: [place] [facility];  Service: General;  Laterality: Left;  . Portacath placement Right 04/01/2014    Procedure: INSERTION PORT-A-CATH;  Surgeon: Rolm Bookbinder, MD;  Location: [facility];  Service: General;  Laterality: Right;    There were no vitals filed for this visit.  Visit Diagnosis:  Postoperative edema  Muscular weakness      Subjective Assessment - 04/29/14 1601    Subjective Nothing new.  Did have a chance to practice self-manual lymph drainage; no questions on that.   Currently in Pain? No/denies                       [facility] Adult PT  Treatment/Exercise - 04/29/14 0001    Shoulder Exercises: ROM/Strengthening   Other ROM/Strengthening Exercises All Stretches from Strength ABC Program: Bil chest, shoulder, tricep, calf, seated hamstring and piriformis figure 4 stretch, supine low trunk rotation all 1 rep and 15 second holds; also quad stretch in standing.   Other ROM/Strengthening Exercises All Strength from Strength ABC Program: Bridging, bil S/L clam, quadruped alternate UE/LE 10 reps each; chest press 2# each hand, squats 1 lb UE lifts, rows with chair 2 lbs, bil hip abduction with fingertips on countertop, scaption against wall 2 lbs, 8" step ups, tricep kickbacks 2 lbs, calf raises with 2# each hand, and bicep curls 2 lbs all 10 reps x 2 sets unless otherwise noted.                 PT Education - 04/29/14 1652    Education provided Yes   Education Details strength ABC program   Person(s) Educated Patient   Methods Explanation;Verbal cues;Other (comment)  gave log filled out for today   Comprehension Returned demonstration           Short Term Clinic Goals - 03/28/14 1627    CC Short Term Goal  #1   Title short term =  long term           Breast Clinic Goals - 02/13/14 1043    Patient will be able to verbalize understanding of pertinent lymphedema risk reduction practices relevant to her diagnosis specifically related to skin care.   Time 1   Period Days   Status Achieved   Patient will be able to return demonstrate and/or verbalize understanding of the post-op home exercise program related to regaining shoulder range of motion.   Time 1   Period Days   Status Achieved   Patient will be able to verbalize understanding of the importance of attending the postoperative After Breast Cancer Class for further lymphedema risk reduction education and therapeutic exercise.   Time 1   Period Days   Status Achieved          Long Term Clinic Goals - 04/24/14 1700    CC Long Term Goal  #3   Status  On-going   CC Long Term Goal  #4   Status On-going            Plan - 04/29/14 1653    Clinical Impression Statement Pt. did well with strength ABC program today, with need for a few verbal/tactile cues.   Pt will benefit from skilled therapeutic intervention in order to improve on the following deficits Increased edema;Decreased knowledge of precautions;Decreased knowledge of use of DME;Decreased strength   PT Treatment/Interventions Therapeutic exercise;Patient/family education   PT Next Visit Plan Discharge next visit.  Check goals.  Review HEP as needed.  Manual lymph drainage as needed.        Problem List Patient Active Problem List   Diagnosis Date Noted  . S/P mastectomy 03/07/2014  . Breast cancer 03/07/2014  . Breast cancer of upper-outer quadrant of left female breast 02/06/2014  . OSA (obstructive sleep apnea) 09/09/2010  . Restless leg syndrome 09/09/2010  . Depressive disorder, not elsewhere classified   . Asthma, cough variant   . Insomnia, unspecified   . Allergic rhinitis due to pollen     [facility] 04/29/2014, 4:56 PM  [place], [place], [address] Phone: [phone]   Fax:  [place], PT 04/29/2014 4:56 PM

## 2014-05-01 ENCOUNTER — Other Ambulatory Visit (HOSPITAL_BASED_OUTPATIENT_CLINIC_OR_DEPARTMENT_OTHER): Payer: 59

## 2014-05-01 ENCOUNTER — Ambulatory Visit (HOSPITAL_BASED_OUTPATIENT_CLINIC_OR_DEPARTMENT_OTHER): Payer: 59

## 2014-05-01 ENCOUNTER — Encounter: Payer: Self-pay | Admitting: Nurse Practitioner

## 2014-05-01 ENCOUNTER — Telehealth: Payer: Self-pay | Admitting: Hematology and Oncology

## 2014-05-01 ENCOUNTER — Ambulatory Visit (HOSPITAL_BASED_OUTPATIENT_CLINIC_OR_DEPARTMENT_OTHER): Payer: 59 | Admitting: Nurse Practitioner

## 2014-05-01 VITALS — BP 120/76 | HR 80 | Temp 97.8°F | Resp 18 | Ht 65.0 in | Wt 153.6 lb

## 2014-05-01 DIAGNOSIS — D709 Neutropenia, unspecified: Secondary | ICD-10-CM

## 2014-05-01 DIAGNOSIS — Z5111 Encounter for antineoplastic chemotherapy: Secondary | ICD-10-CM | POA: Diagnosis not present

## 2014-05-01 DIAGNOSIS — C50412 Malignant neoplasm of upper-outer quadrant of left female breast: Secondary | ICD-10-CM | POA: Diagnosis not present

## 2014-05-01 DIAGNOSIS — C773 Secondary and unspecified malignant neoplasm of axilla and upper limb lymph nodes: Secondary | ICD-10-CM | POA: Diagnosis not present

## 2014-05-01 DIAGNOSIS — R530 Neoplastic (malignant) related fatigue: Secondary | ICD-10-CM

## 2014-05-01 DIAGNOSIS — D696 Thrombocytopenia, unspecified: Secondary | ICD-10-CM

## 2014-05-01 LAB — CBC WITH DIFFERENTIAL/PLATELET
BASO%: 1 % (ref 0.0–2.0)
Basophils Absolute: 0.1 10*3/uL (ref 0.0–0.1)
EOS%: 0.2 % (ref 0.0–7.0)
Eosinophils Absolute: 0 10*3/uL (ref 0.0–0.5)
HCT: 37.3 % (ref 34.8–46.6)
HGB: 12 g/dL (ref 11.6–15.9)
LYMPH#: 2.2 10*3/uL (ref 0.9–3.3)
LYMPH%: 23.4 % (ref 14.0–49.7)
MCH: 29.4 pg (ref 25.1–34.0)
MCHC: 32.3 g/dL (ref 31.5–36.0)
MCV: 91.1 fL (ref 79.5–101.0)
MONO#: 0.9 10*3/uL (ref 0.1–0.9)
MONO%: 9.9 % (ref 0.0–14.0)
NEUT%: 65.5 % (ref 38.4–76.8)
NEUTROS ABS: 6.1 10*3/uL (ref 1.5–6.5)
Platelets: 286 10*3/uL (ref 145–400)
RBC: 4.1 10*6/uL (ref 3.70–5.45)
RDW: 13.8 % (ref 11.2–14.5)
WBC: 9.4 10*3/uL (ref 3.9–10.3)

## 2014-05-01 LAB — COMPREHENSIVE METABOLIC PANEL (CC13)
ALT: 15 U/L (ref 0–55)
ANION GAP: 10 meq/L (ref 3–11)
AST: 13 U/L (ref 5–34)
Albumin: 3.9 g/dL (ref 3.5–5.0)
Alkaline Phosphatase: 87 U/L (ref 40–150)
BUN: 9.9 mg/dL (ref 7.0–26.0)
CALCIUM: 9.4 mg/dL (ref 8.4–10.4)
CO2: 27 meq/L (ref 22–29)
CREATININE: 0.8 mg/dL (ref 0.6–1.1)
Chloride: 106 mEq/L (ref 98–109)
EGFR: 84 mL/min/{1.73_m2} — ABNORMAL LOW (ref >90)
GLUCOSE: 89 mg/dL (ref 70–140)
Potassium: 4.8 mEq/L (ref 3.5–5.1)
Sodium: 142 mEq/L (ref 136–145)
TOTAL PROTEIN: 6.7 g/dL (ref 6.4–8.3)
Total Bilirubin: 0.21 mg/dL (ref 0.20–1.20)

## 2014-05-01 MED ORDER — PALONOSETRON HCL INJECTION 0.25 MG/5ML
0.2500 mg | Freq: Once | INTRAVENOUS | Status: AC
  Administered 2014-05-01: 0.25 mg via INTRAVENOUS

## 2014-05-01 MED ORDER — PALONOSETRON HCL INJECTION 0.25 MG/5ML
INTRAVENOUS | Status: AC
  Filled 2014-05-01: qty 5

## 2014-05-01 MED ORDER — SODIUM CHLORIDE 0.9 % IV SOLN
Freq: Once | INTRAVENOUS | Status: AC
  Administered 2014-05-01: 15:00:00 via INTRAVENOUS
  Filled 2014-05-01: qty 5

## 2014-05-01 MED ORDER — HEPARIN SOD (PORK) LOCK FLUSH 100 UNIT/ML IV SOLN
500.0000 [IU] | Freq: Once | INTRAVENOUS | Status: AC | PRN
  Administered 2014-05-01: 500 [IU]
  Filled 2014-05-01: qty 5

## 2014-05-01 MED ORDER — SODIUM CHLORIDE 0.9 % IV SOLN
500.0000 mg/m2 | Freq: Once | INTRAVENOUS | Status: AC
  Administered 2014-05-01: 880 mg via INTRAVENOUS
  Filled 2014-05-01: qty 44

## 2014-05-01 MED ORDER — SODIUM CHLORIDE 0.9 % IJ SOLN
10.0000 mL | INTRAMUSCULAR | Status: DC | PRN
  Administered 2014-05-01: 10 mL
  Filled 2014-05-01: qty 10

## 2014-05-01 MED ORDER — SODIUM CHLORIDE 0.9 % IV SOLN
Freq: Once | INTRAVENOUS | Status: AC
  Administered 2014-05-01: 15:00:00 via INTRAVENOUS

## 2014-05-01 MED ORDER — DOXORUBICIN HCL CHEMO IV INJECTION 2 MG/ML
50.0000 mg/m2 | Freq: Once | INTRAVENOUS | Status: AC
  Administered 2014-05-01: 88 mg via INTRAVENOUS
  Filled 2014-05-01: qty 44

## 2014-05-01 MED ORDER — PEGFILGRASTIM 6 MG/0.6ML ~~LOC~~ PSKT
6.0000 mg | PREFILLED_SYRINGE | Freq: Once | SUBCUTANEOUS | Status: AC
  Administered 2014-05-01: 6 mg via SUBCUTANEOUS
  Filled 2014-05-01: qty 0.6

## 2014-05-01 NOTE — Assessment & Plan Note (Signed)
Left breast invasive ductal carcinoma grade 1; 4.8 cm with low-grade DCIS; margins negative 1/4 lymph nodes positive, ER 100% PR 100% HER-2 negative ratio 1.18 Ki-67 26% and 19% T2 N1 stage IIB  Treatment plan:  1. Systemic chemotherapy with dose dense Adriamycin and Cytoxan 4 followed by Abraxane weekly 12 2. Followed by adjuvant radiation 3. Followed by adjuvant antiestrogen therapy with anastrozole for 5 years  Current treatment: Cycle 3 day 1 dose dense Adriamycin and Cytoxan  Chemotherapy toxicities: 1. Profound fatigue for 2-3 days after chemotherapy starting day 4 2. Grade 4 neutropenia: decreased the dosage of cycle 2 chemotherapy of Adriamycin 50 mg/m and Cytoxan 500 mg/m 3. Thrombocytopenia grade 1  Libra continues to do well with treatment. The labs were reviewed in detail and were entirely stable. Her ANC and platelet count are normal. Per Dr. Geralyn Flash last note, she will not need to return for follow up visits the week after chemotherapy.   Her next labs and office visit will be on 4/20, prior to her 4th and final treatment of adriamycin and cytoxan.

## 2014-05-01 NOTE — Patient Instructions (Signed)
[facility] Discharge Instructions for Patients Receiving Chemotherapy  Today you received the following chemotherapy agents Adria and Cytoxan  To help prevent nausea and vomiting after your treatment, we encourage you to take your nausea medication as prescribed.   If you develop nausea and vomiting that is not controlled by your nausea medication, call the clinic.   BELOW ARE SYMPTOMS THAT SHOULD BE REPORTED IMMEDIATELY:  *FEVER GREATER THAN 100.5 F  *CHILLS WITH OR WITHOUT FEVER  NAUSEA AND VOMITING THAT IS NOT CONTROLLED WITH YOUR NAUSEA MEDICATION  *UNUSUAL SHORTNESS OF BREATH  *UNUSUAL BRUISING OR BLEEDING  TENDERNESS IN MOUTH AND THROAT WITH OR WITHOUT PRESENCE OF ULCERS  *URINARY PROBLEMS  *BOWEL PROBLEMS  UNUSUAL RASH Items with * indicate a potential emergency and should be followed up as soon as possible.  Feel free to call the clinic you have any questions or concerns. The clinic phone number is [phone].  Please show the CHEMO ALERT CARD at check-in to the Emergency Department and triage nurse.   

## 2014-05-01 NOTE — Progress Notes (Signed)
Patient Care Team: David Swayne, MD as PCP - General (Family Medicine) Patrick E Wright, MD (Pulmonary Disease) Matthew Wakefield, MD as Consulting Physician (General Surgery) Vinay Gudena, MD as Consulting Physician (Hematology and Oncology) Stacy Wentworth, MD as Consulting Physician (Radiation Oncology) Keisha N Martini, RN as Registered Nurse Dawn C Stuart, RN as Registered Nurse Gretchen W Dawson, NP as Nurse Practitioner (Nurse Practitioner)  DIAGNOSIS: Breast cancer of upper-outer quadrant of left female breast   Staging form: Breast, AJCC 7th Edition     Clinical stage from 02/13/2014: Stage IIB (T3, N0, M0) - Unsigned       Staging comments: Staged at breast conference on 1.20.16      Pathologic stage from 03/11/2014: Stage IIB (T2, N1a, cM0) - Signed by Joshua B Kish, MD on 03/19/2014       Staging comments: Staged on final mastectomy specimen by Dr. Hillard    SUMMARY OF ONCOLOGIC HISTORY:   Breast cancer of upper-outer quadrant of left female breast   02/04/2014 Initial Diagnosis left breast: Invasive ductal carcinoma with DCIS ER 100%, PR 100%, HER-2 negative, Ki-67 ranged from 19-26%; third biopsy showed invasive mammary cancer with lobular features and intraductal papilloma, ER/PR positive HER-2 negative   02/11/2014 Breast MRI left breast: 3 masses that are confluent measuring 5.6 x 2.7 x 3 cm extends to the left aspect of area along the penis to involve skin of the area no lymph nodes detected right breast negative   03/07/2014 Surgery Left breast invasive ductal carcinoma grade 1; 4.8 cm with low-grade DCIS; margins negative 1/4 lymph nodes positive, ER 100% PR 100% HER-2 negative ratio 1.18 Ki-67 26% and 19% T2 N1 stage IIB   04/03/2014 -  Chemotherapy Adjuvant chemotherapy with dose dense Adriamycin and Cytoxan followed by weekly Abraxane 12    CHIEF COMPLIANT: Cycle 3 day 1 Adriamycin Cytoxan  INTERVAL HISTORY: Theresa Huff is a 64-year-old with above-mentioned history  of left-sided breast cancer treated with left mastectomy and is currently on adjuvant chemotherapy. today is cycle 3 day 1 of Adriamycin and Cytoxan. Overall she does not have many complaints to offer. As stated at her last visit, the fatigue has not been as limiting with this past cycle of chemo. She is still able to shop a fair amount of time at [employer] every Sunday. She does have 2 moles on her right and left forearm that seem to be changing in terms of color and dryness.  REVIEW OF SYSTEMS:   Constitutional: Denies fevers, chills or abnormal weight loss Eyes: Denies blurriness of vision Ears, nose, mouth, throat, and face: Denies mucositis or sore throat Respiratory: Denies cough, dyspnea or wheezes Cardiovascular: Denies palpitation, chest discomfort or lower extremity swelling Gastrointestinal:  Denies nausea, heartburn or change in bowel habits Skin: 2 moles to right and left forearm Lymphatics: Denies new lymphadenopathy or easy bruising Neurological:Denies numbness, tingling or new weaknesses Behavioral/Psych: Mood is stable, no new changes  Breast:  denies any pain or lumps or nodules in either breasts All other systems were reviewed with the patient and are negative.  I have reviewed the past medical history, past surgical history, social history and family history with the patient and they are unchanged from previous note.  ALLERGIES:  is allergic to codeine; iodine; primidone; sulfa antibiotics; and tape.  MEDICATIONS:  Current Outpatient Prescriptions  Medication Sig Dispense Refill  . calcium carbonate (OS-CAL) 600 MG TABS tablet Take 600 mg by mouth daily.    . cetirizine (ZYRTEC) 10   MG tablet Take 10 mg by mouth daily.      . Cholecalciferol (VITAMIN D PO) Take 2,000 Units by mouth daily.    . dexamethasone (DECADRON) 4 MG tablet Take 2 tablets by mouth once a day on the day after chemotherapy and then take 2 tablets two times a day for 2 days. Take with food. 30 tablet 1   . DULoxetine (CYMBALTA) 30 MG capsule     . esomeprazole (NEXIUM) 20 MG packet Take 20 mg by mouth daily before breakfast.    . Eszopiclone (ESZOPICLONE) 3 MG TABS Take 3 mg by mouth at bedtime. Take immediately before bedtime    . lidocaine-prilocaine (EMLA) cream Apply to affected area once 30 g 3  . mometasone (NASONEX) 50 MCG/ACT nasal spray Place 2 sprays into the nose daily.      . ondansetron (ZOFRAN) 8 MG tablet Take 1 tablet (8 mg total) by mouth 2 (two) times daily as needed. Start on the third day after chemotherapy. 30 tablet 1  . Vitamin D, Ergocalciferol, (DRISDOL) 50000 UNITS CAPS capsule Take 50,000 Units by mouth every 7 (seven) days.    . LORazepam (ATIVAN) 0.5 MG tablet Take 1 tablet (0.5 mg total) by mouth every 6 (six) hours as needed (Nausea or vomiting). (Patient not taking: Reported on 05/01/2014) 30 tablet 0  . oxyCODONE (OXY IR/ROXICODONE) 5 MG immediate release tablet Take 1-2 tablets (5-10 mg total) by mouth every 4 (four) hours as needed for moderate pain. (Patient not taking: Reported on 05/01/2014) 30 tablet 0  . prochlorperazine (COMPAZINE) 10 MG tablet Take 1 tablet (10 mg total) by mouth every 6 (six) hours as needed (Nausea or vomiting). (Patient not taking: Reported on 05/01/2014) 30 tablet 1   No current facility-administered medications for this visit.    PHYSICAL EXAMINATION: ECOG PERFORMANCE STATUS: 1 - Symptomatic but completely ambulatory  Filed Vitals:   05/01/14 1303  BP: 120/76  Pulse: 80  Temp: 97.8 F (36.6 C)  Resp: 18   Filed Weights   05/01/14 1303  Weight: 153 lb 9.6 oz (69.673 kg)    GENERAL:alert, no distress and comfortable SKIN: skin color, texture, turgor are normal, no rashes, bilateral arm moles flaking and discolored EYES: normal, Conjunctiva are pink and non-injected, sclera clear OROPHARYNX:no exudate, no erythema and lips, buccal mucosa, and tongue normal  NECK: supple, thyroid normal size, non-tender, without  nodularity LYMPH:  no palpable lymphadenopathy in the cervical, axillary or inguinal LUNGS: clear to auscultation and percussion with normal breathing effort HEART: regular rate & rhythm and no murmurs and no lower extremity edema ABDOMEN:abdomen soft, non-tender and normal bowel sounds Musculoskeletal:no cyanosis of digits and no clubbing  NEURO: alert & oriented x 3 with fluent speech, no focal motor/sensory deficits  LABORATORY DATA:  I have reviewed the data as listed   Chemistry      Component Value Date/Time   NA 142 05/01/2014 1229   NA 141 03/27/2014 1439   K 4.8 05/01/2014 1229   K 4.0 03/27/2014 1439   CL 108 03/27/2014 1439   CO2 27 05/01/2014 1229   CO2 28 03/27/2014 1439   BUN 9.9 05/01/2014 1229   BUN 9 03/27/2014 1439   CREATININE 0.8 05/01/2014 1229   CREATININE 0.78 03/27/2014 1439      Component Value Date/Time   CALCIUM 9.4 05/01/2014 1229   CALCIUM 9.5 03/27/2014 1439   ALKPHOS 87 05/01/2014 1229   ALKPHOS 76 09/14/2010 1708   AST 13 05/01/2014 1229     AST 18 09/14/2010 1708   ALT 15 05/01/2014 1229   ALT 21 09/14/2010 1708   BILITOT 0.21 05/01/2014 1229   BILITOT 0.4 09/14/2010 1708       Lab Results  Component Value Date   WBC 9.4 05/01/2014   HGB 12.0 05/01/2014   HCT 37.3 05/01/2014   MCV 91.1 05/01/2014   PLT 286 05/01/2014   NEUTROABS 6.1 05/01/2014     ASSESSMENT & PLAN:  Breast cancer of upper-outer quadrant of left female breast Left breast invasive ductal carcinoma grade 1; 4.8 cm with low-grade DCIS; margins negative 1/4 lymph nodes positive, ER 100% PR 100% HER-2 negative ratio 1.18 Ki-67 26% and 19% T2 N1 stage IIB  Treatment plan:  1. Systemic chemotherapy with dose dense Adriamycin and Cytoxan 4 followed by Abraxane weekly 12 2. Followed by adjuvant radiation 3. Followed by adjuvant antiestrogen therapy with anastrozole for 5 years  Current treatment: Cycle 3 day 1 dose dense Adriamycin and Cytoxan  Chemotherapy  toxicities: 1. Profound fatigue for 2-3 days after chemotherapy starting day 4 2. Grade 4 neutropenia: decreased the dosage of cycle 2 chemotherapy of Adriamycin 50 mg/m and Cytoxan 500 mg/m 3. Thrombocytopenia grade 1  Lachrista continues to do well with treatment. The labs were reviewed in detail and were entirely stable. Her ANC and platelet count are normal. Per Dr. Geralyn Flash last note, she will not need to return for follow up visits the week after chemotherapy.   Her next labs and office visit will be on 4/20, prior to her 4th and final treatment of adriamycin and cytoxan.     No orders of the defined types were placed in this encounter.   The patient has a good understanding of the overall plan. she agrees with it. She will call with any problems that may develop before her next visit here.   Laurie Panda, NP

## 2014-05-01 NOTE — Telephone Encounter (Signed)
Appointments made and avs printed for patient °

## 2014-05-02 ENCOUNTER — Ambulatory Visit: Payer: 59 | Admitting: Physical Therapy

## 2014-05-02 DIAGNOSIS — M439 Deforming dorsopathy, unspecified: Secondary | ICD-10-CM | POA: Diagnosis not present

## 2014-05-02 DIAGNOSIS — R609 Edema, unspecified: Secondary | ICD-10-CM

## 2014-05-02 NOTE — Therapy (Signed)
[place], [place], [address] Phone: [phone]   Fax:  [phone]  Physical Therapy Treatment  Patient Details  Name: Theresa Huff MRN: [id number] Date of Birth: [birth year] Referring Provider:  Antony Contras, MD  Encounter Date: 05/02/2014      PT End of Session - 05/02/14 1646    Visit Number 9   Number of Visits 9   Date for PT Re-Evaluation 05/02/14   PT Start Time 1430   PT Stop Time 1515   PT Time Calculation (min) 45 min   Activity Tolerance Patient tolerated treatment well   Behavior During Therapy [facility] for tasks assessed/performed      Past Medical History  Diagnosis Date  . Cough   . Weight gain   . Hot flashes   . Insomnia, unspecified   . Depressive disorder, not elsewhere classified   . Allergic rhinitis due to pollen   . Plantar fasciitis   . Restless leg syndrome 09/09/2010  . Breast cancer   . Skin cancer   . OSA (obstructive sleep apnea) 09/09/2010    mild-no cpap recommended  . GERD (gastroesophageal reflux disease)   . Wears glasses   . Anemia     with pregnancy    Past Surgical History  Procedure Laterality Date  . Tonsillectomy    . Melanoma excision      right leg, left arm, right arm (several from each area)  . Colonoscopy    . Mastectomy w/ sentinel node biopsy Left 03/07/2014    Procedure: LEFT TOTAL MASTECTOMY WITH LEFT SENTINEL LYMPH NODE BIOPSY;  Surgeon: Rolm Bookbinder, MD;  Location: [place];  Service: General;  Laterality: Left;  . Portacath placement Right 04/01/2014    Procedure: INSERTION PORT-A-CATH;  Surgeon: Rolm Bookbinder, MD;  Location: [facility];  Service: General;  Laterality: Right;    There were no vitals filed for this visit.  Visit Diagnosis:  Postoperative edema      Subjective Assessment - 05/02/14 1436    Subjective pt reports chemo is "going" she has a bit of a headache today  pt requests manual lymph  drainage   Pertinent History Diagnosed 01/30/14 with left ER/PR positive, HER2 negative grade I invasive ductal carcinoma breast cancer.     Currently in Pain? No/denies                       [facility] Adult PT Treatment/Exercise - 05/02/14 0001    Manual Therapy   Manual Lymphatic Drainage (MLD) In supine, diaphragmatic breathing, then short neck, right axilla and anterior interaxillary anastomosis, left groin and axillo-inguinal anastomosis, and left UE from dorsal hand to shoulder.Marland Kitchenextra time and instruction spent on axillary and lateral trunk edema  and scar mobilzation                PT Education - 05/02/14 1645    Education provided Yes   Education Details links to lymphedema education session, [employer] and [employer] website   Person(s) Educated Patient   Methods Explanation;Handout   Comprehension Verbalized understanding           Short Term Clinic Goals - 05/02/14 1648    CC Short Term Goal  #1   Title short term = long term   Status Achieved           Breast Clinic Goals - 02/13/14 1043    Patient will be able to verbalize understanding of pertinent lymphedema  risk reduction practices relevant to her diagnosis specifically related to skin care.   Time 1   Period Days   Status Achieved   Patient will be able to return demonstrate and/or verbalize understanding of the post-op home exercise program related to regaining shoulder range of motion.   Time 1   Period Days   Status Achieved   Patient will be able to verbalize understanding of the importance of attending the postoperative After Breast Cancer Class for further lymphedema risk reduction education and therapeutic exercise.   Time 1   Period Days   Status Achieved          Long Term Clinic Goals - 05/02/14 1519    CC Long Term Goal  #1   Title pt will verbalize understanding of lymphedema risk reduction practices   Status Achieved   CC Long Term Goal  #2   Title Pt will reports 50%  reduction in pain and swellling from axillary cording   Status Achieved   CC Long Term Goal  #3   Title Pt will verbalize understanding of progressive resistance strength training   Status Achieved   CC Long Term Goal  #4   Title pt will have decrease in .5 cm left arm circumference at 10 cm proximal to the olecranon   Baseline reduced by 1.3 cm    Status Achieved            Plan - 05/02/14 1646    Clinical Impression Statement pt reports she is understands strength ABC program, reviewd exercise progression.  Provided chip pack to try to place axilla and information about wearease compression cami to help with axillary swelling. pt demonstrated ability to perfrom self manual lymph drainage,  Goals met, she is ready for discharge   Consulted and Agree with Plan of Care Patient        Problem List Patient Active Problem List   Diagnosis Date Noted  . S/P mastectomy 03/07/2014  . Breast cancer 03/07/2014  . Breast cancer of upper-outer quadrant of left female breast 02/06/2014  . OSA (obstructive sleep apnea) 09/09/2010  . Restless leg syndrome 09/09/2010  . Depressive disorder, not elsewhere classified   . Asthma, cough variant   . Insomnia, unspecified   . Allergic rhinitis due to pollen   PHYSICAL THERAPY DISCHARGE SUMMARY  Visits from Start of Care: 9  Current functional level related to goals / functional outcomes: Pt is able to self manage lymphedema and exercise program   Remaining deficits: Lymphedema in left axilla    Education / Equipment: Self manual lymph drainage, progressive exercise program, compression sleeve in ordered Plan: Patient agrees to discharge.  Patient goals were partially met. Patient is being discharged due to meeting the stated rehab goals.  ?????       Donato Heinz. Owens Shark, PT  05/02/2014, 4:49 PM  [place], [place], [address] Phone: [phone]   Fax:   [phone]

## 2014-05-02 NOTE — Patient Instructions (Signed)
[email or web address] Courses  Online Strength after breast cancer Right of page.Marland Kitchenlymphedema education session

## 2014-05-03 ENCOUNTER — Emergency Department (HOSPITAL_COMMUNITY)
Admission: EM | Admit: 2014-05-03 | Discharge: 2014-05-03 | Disposition: A | Discharge status: Home / Self Care | Payer: 59 | Attending: Emergency Medicine | Admitting: Emergency Medicine

## 2014-05-03 ENCOUNTER — Emergency Department (HOSPITAL_COMMUNITY): Payer: 59

## 2014-05-03 ENCOUNTER — Encounter (HOSPITAL_COMMUNITY): Payer: Self-pay

## 2014-05-03 ENCOUNTER — Telehealth: Payer: Self-pay

## 2014-05-03 DIAGNOSIS — Z85828 Personal history of other malignant neoplasm of skin: Secondary | ICD-10-CM | POA: Diagnosis not present

## 2014-05-03 DIAGNOSIS — K219 Gastro-esophageal reflux disease without esophagitis: Secondary | ICD-10-CM | POA: Insufficient documentation

## 2014-05-03 DIAGNOSIS — Z862 Personal history of diseases of the blood and blood-forming organs and certain disorders involving the immune mechanism: Secondary | ICD-10-CM | POA: Insufficient documentation

## 2014-05-03 DIAGNOSIS — F319 Bipolar disorder, unspecified: Secondary | ICD-10-CM | POA: Diagnosis not present

## 2014-05-03 DIAGNOSIS — G4700 Insomnia, unspecified: Secondary | ICD-10-CM | POA: Insufficient documentation

## 2014-05-03 DIAGNOSIS — Z853 Personal history of malignant neoplasm of breast: Secondary | ICD-10-CM | POA: Diagnosis not present

## 2014-05-03 DIAGNOSIS — Z87891 Personal history of nicotine dependence: Secondary | ICD-10-CM | POA: Insufficient documentation

## 2014-05-03 DIAGNOSIS — D72829 Elevated white blood cell count, unspecified: Secondary | ICD-10-CM | POA: Diagnosis not present

## 2014-05-03 DIAGNOSIS — Z79899 Other long term (current) drug therapy: Secondary | ICD-10-CM | POA: Insufficient documentation

## 2014-05-03 DIAGNOSIS — H538 Other visual disturbances: Secondary | ICD-10-CM | POA: Diagnosis not present

## 2014-05-03 DIAGNOSIS — R531 Weakness: Secondary | ICD-10-CM | POA: Insufficient documentation

## 2014-05-03 DIAGNOSIS — Z7951 Long term (current) use of inhaled steroids: Secondary | ICD-10-CM | POA: Diagnosis not present

## 2014-05-03 DIAGNOSIS — R51 Headache: Secondary | ICD-10-CM | POA: Insufficient documentation

## 2014-05-03 DIAGNOSIS — G2581 Restless legs syndrome: Secondary | ICD-10-CM | POA: Insufficient documentation

## 2014-05-03 DIAGNOSIS — R519 Headache, unspecified: Secondary | ICD-10-CM

## 2014-05-03 LAB — CBC
HEMATOCRIT: 34 % — AB (ref 36.0–46.0)
HEMOGLOBIN: 11.3 g/dL — AB (ref 12.0–15.0)
MCH: 31.1 pg (ref 26.0–34.0)
MCHC: 33.2 g/dL (ref 30.0–36.0)
MCV: 93.7 fL (ref 78.0–100.0)
Platelets: 250 10*3/uL (ref 150–400)
RBC: 3.63 MIL/uL — ABNORMAL LOW (ref 3.87–5.11)
RDW: 15.3 % (ref 11.5–15.5)
WBC: 110.3 10*3/uL — AB (ref 4.0–10.5)

## 2014-05-03 LAB — BASIC METABOLIC PANEL
Anion gap: 10 (ref 5–15)
BUN: 17 mg/dL (ref 6–23)
CALCIUM: 9.6 mg/dL (ref 8.4–10.5)
CO2: 26 mmol/L (ref 19–32)
Chloride: 103 mmol/L (ref 96–112)
Creatinine, Ser: 0.77 mg/dL (ref 0.50–1.10)
GFR calc Af Amer: >90 mL/min (ref >90)
GFR, EST NON AFRICAN AMERICAN: 89 mL/min — AB (ref >90)
Glucose, Bld: 121 mg/dL — ABNORMAL HIGH (ref 70–99)
Potassium: 4.1 mmol/L (ref 3.5–5.1)
Sodium: 139 mmol/L (ref 135–145)

## 2014-05-03 LAB — CBC WITH DIFFERENTIAL/PLATELET
BASOS PCT: 0 % (ref 0–1)
Basophils Absolute: 0 10*3/uL (ref 0.0–0.1)
EOS ABS: 0 10*3/uL (ref 0.0–0.7)
Eosinophils Relative: 0 % (ref 0–5)
HEMATOCRIT: 32.7 % — AB (ref 36.0–46.0)
HEMOGLOBIN: 10.8 g/dL — AB (ref 12.0–15.0)
LYMPHS PCT: 1 % — AB (ref 12–46)
Lymphs Abs: 1.1 10*3/uL (ref 0.7–4.0)
MCH: 30.9 pg (ref 26.0–34.0)
MCHC: 33 g/dL (ref 30.0–36.0)
MCV: 93.7 fL (ref 78.0–100.0)
MONO ABS: 1.1 10*3/uL — AB (ref 0.1–1.0)
Monocytes Relative: 1 % — ABNORMAL LOW (ref 3–12)
Neutro Abs: 109.1 10*3/uL — ABNORMAL HIGH (ref 1.7–7.7)
Neutrophils Relative %: 98 % — ABNORMAL HIGH (ref 43–77)
Platelets: 235 10*3/uL (ref 150–400)
RBC: 3.49 MIL/uL — ABNORMAL LOW (ref 3.87–5.11)
RDW: 15.2 % (ref 11.5–15.5)
WBC: 111.3 10*3/uL (ref 4.0–10.5)

## 2014-05-03 LAB — URINALYSIS, ROUTINE W REFLEX MICROSCOPIC
Bilirubin Urine: NEGATIVE
GLUCOSE, UA: NEGATIVE mg/dL
Hgb urine dipstick: NEGATIVE
Ketones, ur: NEGATIVE mg/dL
NITRITE: NEGATIVE
PROTEIN: NEGATIVE mg/dL
Specific Gravity, Urine: 1.007 (ref 1.005–1.030)
Urobilinogen, UA: 0.2 mg/dL (ref 0.0–1.0)
pH: 6.5 (ref 5.0–8.0)

## 2014-05-03 LAB — URINE MICROSCOPIC-ADD ON

## 2014-05-03 MED ORDER — FLUORESCEIN SODIUM 1 MG OP STRP
1.0000 | ORAL_STRIP | Freq: Once | OPHTHALMIC | Status: DC
  Filled 2014-05-03: qty 1

## 2014-05-03 MED ORDER — DIPHENHYDRAMINE HCL 50 MG/ML IJ SOLN
25.0000 mg | Freq: Once | INTRAMUSCULAR | Status: AC
  Administered 2014-05-03: 25 mg via INTRAVENOUS
  Filled 2014-05-03: qty 1

## 2014-05-03 MED ORDER — METOCLOPRAMIDE HCL 5 MG/ML IJ SOLN
10.0000 mg | Freq: Once | INTRAMUSCULAR | Status: AC
  Administered 2014-05-03: 10 mg via INTRAVENOUS
  Filled 2014-05-03: qty 2

## 2014-05-03 MED ORDER — SODIUM CHLORIDE 0.9 % IV BOLUS (SEPSIS)
1000.0000 mL | Freq: Once | INTRAVENOUS | Status: AC
  Administered 2014-05-03: 1000 mL via INTRAVENOUS

## 2014-05-03 MED ORDER — TETRACAINE HCL 0.5 % OP SOLN
2.0000 [drp] | Freq: Once | OPHTHALMIC | Status: DC
  Filled 2014-05-03: qty 2

## 2014-05-03 NOTE — ED Provider Notes (Signed)
CSN: [id number]     Arrival date & time 05/03/14  1815 History   First MD Initiated Contact with Patient 05/03/14 1955     Chief Complaint  Patient presents with  . Eye Pain  . Weakness  . Headache     (Consider location/radiation/quality/duration/timing/severity/associated sxs/prior Treatment) Patient is a 64 y.o. female presenting with eye pain, weakness, and headaches. The history is provided by the patient. No language interpreter was used.  Eye Pain This is a new problem. The current episode started 6 to 12 hours ago. Progression since onset: Waxing and waning. Associated symptoms include headaches. Pertinent negatives include no chest pain, no abdominal pain and no shortness of breath. Nothing aggravates the symptoms. Nothing relieves the symptoms. She has tried nothing for the symptoms. The treatment provided no relief.  Weakness Associated symptoms include headaches. Pertinent negatives include no chest pain, no abdominal pain and no shortness of breath.  Headache Location: Right ocular. Quality:  Dull Radiates to:  Does not radiate Pain severity now: Moderate. Pain scale at highest: Moderate. Onset quality:  Gradual Timing:  Constant Progression:  Waxing and waning Chronicity:  New Similar to prior headaches: no   Relieved by:  Nothing Worsened by:  Light Associated symptoms: blurred vision, eye pain, photophobia and weakness   Associated symptoms: no abdominal pain, no back pain, no congestion, no cough, no diarrhea, no dizziness, no drainage, no ear pain, no facial pain, no fatigue, no fever, no focal weakness, no nausea, no neck pain, no neck stiffness, no numbness, no seizures, no sinus pressure, no sore throat, no visual change and no vomiting   Risk factors: no anger, no family hx of SAH, does not have insomnia and lifestyle not sedentary     Past Medical History  Diagnosis Date  . Cough   . Weight gain   . Hot flashes   . Insomnia, unspecified   . Depressive  disorder, not elsewhere classified   . Allergic rhinitis due to pollen   . Plantar fasciitis   . Restless leg syndrome 09/09/2010  . Breast cancer   . Skin cancer   . OSA (obstructive sleep apnea) 09/09/2010    mild-no cpap recommended  . GERD (gastroesophageal reflux disease)   . Wears glasses   . Anemia     with pregnancy   Past Surgical History  Procedure Laterality Date  . Tonsillectomy    . Melanoma excision      right leg, left arm, right arm (several from each area)  . Colonoscopy    . Mastectomy w/ sentinel node biopsy Left 03/07/2014    Procedure: LEFT TOTAL MASTECTOMY WITH LEFT SENTINEL LYMPH NODE BIOPSY;  Surgeon: Rolm Bookbinder, MD;  Location: [facility];  Service: General;  Laterality: Left;  . Portacath placement Right 04/01/2014    Procedure: INSERTION PORT-A-CATH;  Surgeon: Rolm Bookbinder, MD;  Location: [facility];  Service: General;  Laterality: Right;   Family History  Problem Relation Age of Onset  . Alcohol abuse Father   . Emphysema Mother   . Heart disease Mother   . Heart failure Mother   . Asthma Daughter   . Asthma Other     grandson  . Asthma Sister    History  Substance Use Topics  . Smoking status: Former Smoker -- 0.20 packs/day for 6 years    Types: Cigarettes    Quit date: 01/25/1974  . Smokeless tobacco: Never Used  . Alcohol Use: Yes     Comment: 5  glasses per month   OB History    No data available     Review of Systems  Constitutional: Negative for fever, chills, diaphoresis, activity change, appetite change and fatigue.  HENT: Negative for congestion, ear pain, facial swelling, postnasal drip, rhinorrhea, sinus pressure and sore throat.   Eyes: Positive for blurred vision, photophobia and pain. Negative for discharge.  Respiratory: Negative for cough, chest tightness and shortness of breath.   Cardiovascular: Negative for chest pain, palpitations and leg swelling.  Gastrointestinal: Negative for nausea, vomiting,  abdominal pain and diarrhea.  Endocrine: Negative for polydipsia and polyuria.  Genitourinary: Negative for dysuria, frequency, difficulty urinating and pelvic pain.  Musculoskeletal: Negative for back pain, arthralgias, neck pain and neck stiffness.  Skin: Negative for color change and wound.  Allergic/Immunologic: Negative for immunocompromised state.  Neurological: Positive for weakness and headaches. Negative for dizziness, focal weakness, seizures, facial asymmetry and numbness.  Hematological: Does not bruise/bleed easily.  Psychiatric/Behavioral: Negative for confusion and agitation.      Allergies  Codeine; Iodine; Primidone; Sulfa antibiotics; and Tape  Home Medications   Prior to Admission medications   Medication Sig Start Date End Date Taking? Authorizing Provider  calcium carbonate (OS-CAL) 600 MG TABS tablet Take 600 mg by mouth daily.   Yes Historical Provider, MD  cetirizine (ZYRTEC) 10 MG tablet Take 10 mg by mouth daily.     Yes Historical Provider, MD  Cholecalciferol (VITAMIN D PO) Take 2,000 Units by mouth daily.   Yes Historical Provider, MD  dexamethasone (DECADRON) 4 MG tablet Take 2 tablets by mouth once a day on the day after chemotherapy and then take 2 tablets two times a day for 2 days. Take with food. 03/22/14  Yes Nicholas Lose, MD  DULoxetine (CYMBALTA) 30 MG capsule Take 30 mg by mouth daily.  03/09/14  Yes Historical Provider, MD  esomeprazole (NEXIUM) 20 MG packet Take 20 mg by mouth daily before breakfast.   Yes Historical Provider, MD  Eszopiclone (ESZOPICLONE) 3 MG TABS Take 3 mg by mouth at bedtime. Take immediately before bedtime   Yes Historical Provider, MD  mometasone (NASONEX) 50 MCG/ACT nasal spray Place 2 sprays into the nose daily.     Yes Historical Provider, MD  ondansetron (ZOFRAN) 8 MG tablet Take 1 tablet (8 mg total) by mouth 2 (two) times daily as needed. Start on the third day after chemotherapy. 03/22/14  Yes Nicholas Lose, MD   lidocaine-prilocaine (EMLA) cream Apply to affected area once 03/22/14   Nicholas Lose, MD  LORazepam (ATIVAN) 0.5 MG tablet Take 1 tablet (0.5 mg total) by mouth every 6 (six) hours as needed (Nausea or vomiting). Patient not taking: Reported on 05/01/2014 03/22/14   Nicholas Lose, MD  oxyCODONE (OXY IR/ROXICODONE) 5 MG immediate release tablet Take 1-2 tablets (5-10 mg total) by mouth every 4 (four) hours as needed for moderate pain. Patient not taking: Reported on 05/01/2014 03/08/14   Rolm Bookbinder, MD  prochlorperazine (COMPAZINE) 10 MG tablet Take 1 tablet (10 mg total) by mouth every 6 (six) hours as needed (Nausea or vomiting). Patient not taking: Reported on 05/01/2014 03/22/14   Nicholas Lose, MD  Vitamin D, Ergocalciferol, (DRISDOL) 50000 UNITS CAPS capsule Take 50,000 Units by mouth every 7 (seven) days.    Historical Provider, MD   BP 109/73 mmHg  Pulse 77  Temp(Src) 98 F (36.7 C) (Oral)  Resp 16  Ht 5\' 5"  (1.651 m)  Wt 149 lb (67.586 kg)  BMI 24.79 kg/m2  SpO2 99% Physical Exam  Constitutional: She is oriented to person, place, and time. She appears well-developed and well-nourished. No distress.  HENT:  Head: Normocephalic and atraumatic.  Mouth/Throat: No oropharyngeal exudate.  Eyes: Conjunctivae and EOM are normal. Pupils are equal, round, and reactive to light.  Neck: Normal range of motion. Neck supple.  Cardiovascular: Normal rate, regular rhythm and normal heart sounds.  Exam reveals no gallop and no friction rub.   No murmur heard. Pulmonary/Chest: Effort normal and breath sounds normal. No respiratory distress. She has no wheezes. She has no rales.  Abdominal: Soft. Bowel sounds are normal. She exhibits no distension and no mass. There is no tenderness. There is no rebound and no guarding.  Musculoskeletal: Normal range of motion. She exhibits no edema or tenderness.  Neurological: She is alert and oriented to person, place, and time. She has normal strength. She  displays no atrophy and no tremor. No cranial nerve deficit or sensory deficit. She exhibits normal muscle tone. She displays a negative Romberg sign. She displays no seizure activity. Coordination and gait normal. GCS eye subscore is 4. GCS verbal subscore is 5. GCS motor subscore is 6.  Skin: Skin is warm and dry.  Psychiatric: She has a normal mood and affect.    ED Course  Procedures (including critical care time) Labs Review Labs Reviewed  CBC - Abnormal; Notable for the following:    WBC 110.3 (*)    RBC 3.63 (*)    Hemoglobin 11.3 (*)    HCT 34.0 (*)    All other components within normal limits  BASIC METABOLIC PANEL - Abnormal; Notable for the following:    Glucose, Bld 121 (*)    GFR calc non Af Amer 89 (*)    All other components within normal limits  URINALYSIS, ROUTINE W REFLEX MICROSCOPIC - Abnormal; Notable for the following:    Leukocytes, UA TRACE (*)    All other components within normal limits  CBC WITH DIFFERENTIAL/PLATELET - Abnormal; Notable for the following:    WBC 111.3 (*)    RBC 3.49 (*)    Hemoglobin 10.8 (*)    HCT 32.7 (*)    Neutrophils Relative % 98 (*)    Lymphocytes Relative 1 (*)    Monocytes Relative 1 (*)    Neutro Abs 109.1 (*)    Monocytes Absolute 1.1 (*)    All other components within normal limits  URINE MICROSCOPIC-ADD ON    Imaging Review Ct Head Wo Contrast  05/03/2014   CLINICAL DATA:  Headache.  Right eye pain.  Chemotherapy.  EXAM: CT HEAD WITHOUT CONTRAST  TECHNIQUE: Contiguous axial images were obtained from the base of the skull through the vertex without intravenous contrast.  COMPARISON:  None.  FINDINGS: No acute cortical infarct, hemorrhage, or mass lesion is present. The ventricles are of normal size. No significant extra-axial fluid collection is evident. The paranasal sinuses and mastoid air cells are clear. The calvarium is intact.  IMPRESSION: Negative CT of the head.   Electronically Signed   By: San Morelle  M.D.   On: 05/03/2014 21:21     EKG Interpretation None      MDM   Final diagnoses:  Nonintractable episodic headache, unspecified headache type  Blurry vision  Leukocytosis    Pt is a 64 y.o. female with Pmhx as above who presents with 1 day of intermittent blurry vision, photophobia and right-sided headache.  She states she has some tearing in her right thigh  earlier and had a foreign body sensation.  Headache has been waxing and waning, though constant.  She has been having increased fatigue since last chemotherapy treatment.  She denies fevers, chills, nausea, vomiting, numbness, weakness or confusion.  She has no visual changes currently.  Physical exam vital signs are stable and she is in no acute distress.  Neurologic exam is unremarkable.  Eyes also appear unremarkable.  Pupils are equal, round, reactive to light.  There is no conjunctival injection or tearing.  Woods lamp exam is unremarkable.  Patient has history of ocular migraines, though states that the symptoms are somewhat different.  CT had ordered which was negative.  CBC and CMP have also been ordered and white blood cell count is significantly elevated.  Lab was repeated to confirm.  Spoke with Dr.Feng -oncology, she believes that this leukocytosis is likely due to recent Neupogen injection.  She will leave a message for patient's primary oncologist, and this will be followed.  Patient feeling somewhat improved after migraine cocktail.. Exam is not consistent with CVA, TIA, subarachnoid hemorrhage, meningitis.  Believe she is safe to be discharged with outpatient follow-up    SHANTELLE ALLES evaluation in the Emergency Department is complete. It has been determined that no acute conditions requiring further emergency intervention are present at this time. The patient/guardian have been advised of the diagnosis and plan. We have discussed signs and symptoms that warrant return to the ED, such as changes or worsening in symptoms,  worsening pain, fever, numbness, weakness, confusion.      Ernestina Patches, MD 05/03/14 [phone]

## 2014-05-03 NOTE — Telephone Encounter (Signed)
Pt lvm stating she is having trouble with her eyes, they are going out of focus, they are flickering, watering real bad. This is especially out in the sunlight even if she is wearing sunglasses. She is receiving adriamycin, cytoxan last given on 4/6.

## 2014-05-03 NOTE — ED Notes (Addendum)
1st set of blood cultures collected.

## 2014-05-03 NOTE — ED Notes (Addendum)
Initially Pt states this morning noted eye became painful and watery.  No injury noted.  However, upon further assessment.  Pt had last chemo Wednesday and was told she should be feeling well and have appetite back.  Pt states still has no appetite.  Has headache.  Blurred vision in both eyes to the sun.  And feels weaker than normal

## 2014-05-03 NOTE — ED Notes (Signed)
2nd set of blood cultures collected 

## 2014-05-03 NOTE — Discharge Instructions (Signed)

## 2014-05-03 NOTE — Telephone Encounter (Signed)
Called pt back. This started this morning, the eyes are not itching but burning slightly. She cannot tell if they are red when she looks in the mirror without her glasses on. The right eye is slightly swollen. She stated earlier today she had trouble seeing the side of the road to pull over. S/w Selena Lesser NP and called pt back. Told her to go to ER because of inability to focus. It is most likely a reaction to adriamycin but we do not want to miss something else. Told her to have a friend drive her or call [phone]. Pt said OK.

## 2014-05-03 NOTE — ED Notes (Signed)
Nurse drawing labs. 

## 2014-05-06 ENCOUNTER — Ambulatory Visit (HOSPITAL_BASED_OUTPATIENT_CLINIC_OR_DEPARTMENT_OTHER): Payer: 59

## 2014-05-06 ENCOUNTER — Ambulatory Visit: Payer: 59

## 2014-05-06 ENCOUNTER — Other Ambulatory Visit: Payer: Self-pay | Admitting: *Deleted

## 2014-05-06 ENCOUNTER — Ambulatory Visit (HOSPITAL_BASED_OUTPATIENT_CLINIC_OR_DEPARTMENT_OTHER): Payer: 59 | Admitting: Nurse Practitioner

## 2014-05-06 ENCOUNTER — Encounter: Payer: Self-pay | Admitting: Nurse Practitioner

## 2014-05-06 ENCOUNTER — Other Ambulatory Visit (HOSPITAL_BASED_OUTPATIENT_CLINIC_OR_DEPARTMENT_OTHER): Payer: 59

## 2014-05-06 VITALS — BP 122/73 | HR 83 | Temp 98.2°F | Wt 153.7 lb

## 2014-05-06 DIAGNOSIS — E860 Dehydration: Secondary | ICD-10-CM | POA: Diagnosis not present

## 2014-05-06 DIAGNOSIS — C773 Secondary and unspecified malignant neoplasm of axilla and upper limb lymph nodes: Secondary | ICD-10-CM | POA: Diagnosis not present

## 2014-05-06 DIAGNOSIS — C50412 Malignant neoplasm of upper-outer quadrant of left female breast: Secondary | ICD-10-CM

## 2014-05-06 DIAGNOSIS — Z95828 Presence of other vascular implants and grafts: Secondary | ICD-10-CM

## 2014-05-06 DIAGNOSIS — G43109 Migraine with aura, not intractable, without status migrainosus: Secondary | ICD-10-CM

## 2014-05-06 DIAGNOSIS — G43909 Migraine, unspecified, not intractable, without status migrainosus: Secondary | ICD-10-CM | POA: Insufficient documentation

## 2014-05-06 DIAGNOSIS — G43009 Migraine without aura, not intractable, without status migrainosus: Secondary | ICD-10-CM

## 2014-05-06 DIAGNOSIS — G43809 Other migraine, not intractable, without status migrainosus: Secondary | ICD-10-CM

## 2014-05-06 DIAGNOSIS — G43001 Migraine without aura, not intractable, with status migrainosus: Secondary | ICD-10-CM

## 2014-05-06 LAB — COMPREHENSIVE METABOLIC PANEL (CC13)
ALK PHOS: 154 U/L — AB (ref 40–150)
ALT: 10 U/L (ref 0–55)
ANION GAP: 9 meq/L (ref 3–11)
AST: 12 U/L (ref 5–34)
Albumin: 3.5 g/dL (ref 3.5–5.0)
BILIRUBIN TOTAL: 0.46 mg/dL (ref 0.20–1.20)
BUN: 11.9 mg/dL (ref 7.0–26.0)
CALCIUM: 8.5 mg/dL (ref 8.4–10.4)
CO2: 27 mEq/L (ref 22–29)
Chloride: 104 mEq/L (ref 98–109)
Creatinine: 0.6 mg/dL (ref 0.6–1.1)
EGFR: >90 mL/min/{1.73_m2} (ref >90)
Glucose: 89 mg/dl (ref 70–140)
Potassium: 4.2 mEq/L (ref 3.5–5.1)
Sodium: 141 mEq/L (ref 136–145)
Total Protein: 5.8 g/dL — ABNORMAL LOW (ref 6.4–8.3)

## 2014-05-06 LAB — CBC WITH DIFFERENTIAL/PLATELET
BASO%: 0.8 % (ref 0.0–2.0)
Basophils Absolute: 0.2 10*3/uL — ABNORMAL HIGH (ref 0.0–0.1)
EOS ABS: 0 10*3/uL (ref 0.0–0.5)
EOS%: 0 % (ref 0.0–7.0)
HCT: 32.8 % — ABNORMAL LOW (ref 34.8–46.6)
HGB: 10.9 g/dL — ABNORMAL LOW (ref 11.6–15.9)
LYMPH%: 5.1 % — ABNORMAL LOW (ref 14.0–49.7)
MCH: 30.4 pg (ref 25.1–34.0)
MCHC: 33.2 g/dL (ref 31.5–36.0)
MCV: 91.4 fL (ref 79.5–101.0)
MONO#: 0.1 10*3/uL (ref 0.1–0.9)
MONO%: 0.5 % (ref 0.0–14.0)
NEUT%: 93.6 % — ABNORMAL HIGH (ref 38.4–76.8)
NEUTROS ABS: 25.6 10*3/uL — AB (ref 1.5–6.5)
Platelets: 181 10*3/uL (ref 145–400)
RBC: 3.59 10*6/uL — ABNORMAL LOW (ref 3.70–5.45)
RDW: 15.3 % — AB (ref 11.2–14.5)
WBC: 27.4 10*3/uL — ABNORMAL HIGH (ref 3.9–10.3)
lymph#: 1.4 10*3/uL (ref 0.9–3.3)
nRBC: 0 % (ref 0–0)

## 2014-05-06 MED ORDER — SUMATRIPTAN SUCCINATE 50 MG PO TABS: 50.0000 mg | ORAL_TABLET | ORAL | Status: DC | PRN

## 2014-05-06 MED ORDER — DIPHENHYDRAMINE HCL 50 MG/ML IJ SOLN
25.0000 mg | Freq: Once | INTRAMUSCULAR | Status: AC
  Administered 2014-05-06: 25 mg via INTRAVENOUS

## 2014-05-06 MED ORDER — SODIUM CHLORIDE 0.9 % IV SOLN
Freq: Once | INTRAVENOUS | Status: AC
  Administered 2014-05-06: 13:00:00 via INTRAVENOUS
  Filled 2014-05-06: qty 4

## 2014-05-06 MED ORDER — SODIUM CHLORIDE 0.9 % IV SOLN
INTRAVENOUS | Status: AC
  Administered 2014-05-06: 13:00:00 via INTRAVENOUS

## 2014-05-06 MED ORDER — KETOROLAC TROMETHAMINE 30 MG/ML IJ SOLN
INTRAMUSCULAR | Status: AC
  Filled 2014-05-06: qty 1

## 2014-05-06 MED ORDER — SODIUM CHLORIDE 0.9 % IJ SOLN
10.0000 mL | INTRAMUSCULAR | Status: DC | PRN
  Administered 2014-05-06: 10 mL via INTRAVENOUS
  Filled 2014-05-06: qty 10

## 2014-05-06 MED ORDER — KETOROLAC TROMETHAMINE 30 MG/ML IJ SOLN
30.0000 mg | Freq: Once | INTRAMUSCULAR | Status: AC
  Administered 2014-05-06: 30 mg via INTRAVENOUS

## 2014-05-06 MED ORDER — DIPHENHYDRAMINE HCL 50 MG/ML IJ SOLN
INTRAMUSCULAR | Status: AC
  Filled 2014-05-06: qty 1

## 2014-05-06 MED ORDER — HEPARIN SOD (PORK) LOCK FLUSH 100 UNIT/ML IV SOLN
500.0000 [IU] | Freq: Once | INTRAVENOUS | Status: AC
  Administered 2014-05-06: 500 [IU] via INTRAVENOUS
  Filled 2014-05-06: qty 5

## 2014-05-06 NOTE — Patient Instructions (Signed)

## 2014-05-06 NOTE — Patient Instructions (Signed)
Dehydration, Adult Dehydration is when you lose more fluids from the body than you take in. Vital organs like the kidneys, brain, and heart cannot function without a proper amount of fluids and salt. Any loss of fluids from the body can cause dehydration.  CAUSES   Vomiting.  Diarrhea.  Excessive sweating.  Excessive urine output.  Fever. SYMPTOMS  Mild dehydration  Thirst.  Dry lips.  Slightly dry mouth. Moderate dehydration  Very dry mouth.  Sunken eyes.  Skin does not bounce back quickly when lightly pinched and released.  Dark urine and decreased urine production.  Decreased tear production.  Headache. Severe dehydration  Very dry mouth.  Extreme thirst.  Rapid, weak pulse (more than 100 beats per minute at rest).  Cold hands and feet.  Not able to sweat in spite of heat and temperature.  Rapid breathing.  Blue lips.  Confusion and lethargy.  Difficulty being awakened.  Minimal urine production.  No tears. DIAGNOSIS  Your caregiver will diagnose dehydration based on your symptoms and your exam. Blood and urine tests will help confirm the diagnosis. The diagnostic evaluation should also identify the cause of dehydration. TREATMENT  Treatment of mild or moderate dehydration can often be done at home by increasing the amount of fluids that you drink. It is best to drink small amounts of fluid more often. Drinking too much at one time can make vomiting worse. Refer to the home care instructions below. Severe dehydration needs to be treated at the hospital where you will probably be given intravenous (IV) fluids that contain water and electrolytes. HOME CARE INSTRUCTIONS   Ask your caregiver about specific rehydration instructions.  Drink enough fluids to keep your urine clear or pale yellow.  Drink small amounts frequently if you have nausea and vomiting.  Eat as you normally do.  Avoid:  Foods or drinks high in sugar.  Carbonated  drinks.  Juice.  Extremely hot or cold fluids.  Drinks with caffeine.  Fatty, greasy foods.  Alcohol.  Tobacco.  Overeating.  Gelatin desserts.  Wash your hands well to avoid spreading bacteria and viruses.  Only take over-the-counter or prescription medicines for pain, discomfort, or fever as directed by your caregiver.  Ask your caregiver if you should continue all prescribed and over-the-counter medicines.  Keep all follow-up appointments with your caregiver. SEEK MEDICAL CARE IF:  You have abdominal pain and it increases or stays in one area (localizes).  You have a rash, stiff neck, or severe headache.  You are irritable, sleepy, or difficult to awaken.  You are weak, dizzy, or extremely thirsty. SEEK IMMEDIATE MEDICAL CARE IF:   You are unable to keep fluids down or you get worse despite treatment.  You have frequent episodes of vomiting or diarrhea.  You have blood or green matter (bile) in your vomit.  You have blood in your stool or your stool looks black and tarry.  You have not urinated in 6 to 8 hours, or you have only urinated a small amount of very dark urine.  You have a fever.  You faint. MAKE SURE YOU:   Understand these instructions.  Will watch your condition.  Will get help right away if you are not doing well or get worse. Document Released: 01/11/2005 Document Revised: 04/05/2011 Document Reviewed: 08/31/2010 Exit[employer] Patient Information 2015 [employer]. This information is not intended to replace advice given to you by your health care provider. Make sure you discuss any questions you have with your health care   provider.  

## 2014-05-06 NOTE — Assessment & Plan Note (Signed)
Patient has a history of chronic ocular-type migraines in the past.  She states that she typically only gets these headaches once every few years.  She developed the onset of this headache this past Friday, 05/03/2014.  She is complaining of right-sided head pain, specific pain both behind her right eye, some increased tearing of her right eye, and some mild vision changes to her right eye only.  Patient presented to the emergency department on 05/03/2014 for further evaluation and management of her headache.  Labs drawn at that time were essentially within normal limits.  Eye exam with the Wood's lamp was negative.  Head CT obtained at that time was negative for acute findings.  Patient was given Benadryl and Reglan; as well as IV fluid rehydration while in the emergency department-with decrease in her headache pain.  However, patient states the headache did not completely resolved; and she continues with a dull ache to the right side of her head.  She states that her vision is no longer fuzzy; and she only has minimal tearing to her right eye.  On exam-patient appears neurologically intact.  Patient received migraine cocktail which consisted of Benadryl 25 mg IV, Zofran 8 mg IV, Toradol 30 mg IV, and 1 L IV fluid rehydration.  Patient states that her headache had almost completely resolved by the time she completed her IV fluids while at the cancer center.  Advised that patient try Excedrin Migraine over-the-counter at the onset of further headaches.  She was also prescribed Imitrex to try if the Excedrin did not work.  Also, patient was advised to call/return of her directly to the emergency department for any worsening symptoms whatsoever.

## 2014-05-06 NOTE — Progress Notes (Signed)
Received call from patient stating she was in the ED on Friday night with a migraine.  She states had a terrible experience.  She feels a little better today but has not been drinking and is having some nausea without vomiting.  Informed her she could come in today and see Cindy our symptom management nurse practitioner and possibly get some IVF's.  Confirmed appointment for today at 1045am.

## 2014-05-06 NOTE — Assessment & Plan Note (Signed)
Patient received cycle 3 of her dose dense AC chemotherapy on 05/01/2014.  She is scheduled for cycle 4 of the same regimen on 05/15/2014.  The plan is for the patient to complete a total of 4 cycles of dose dense AC chemotherapy; and then received 12 cycles of Abraxane chemotherapy.  She will then receive radiation therapy.  Patient has an initial radiation oncology consultation scheduled for 05/09/2014.  She has plans to return to the [facility] for her next labs, visit, and chemotherapy on 05/15/2014.

## 2014-05-06 NOTE — Progress Notes (Signed)
SYMPTOM MANAGEMENT CLINIC   HPI: Theresa Huff 64 y.o. female diagnosed with breast cancer.  He is status post mastectomy.  Currently undergoing dose dense AC chemotherapy regimen.  Patient has a history of chronic ocular-type migraines in the past.  She states that she typically only gets these headaches once every few years.  She developed the onset of this headache this past Friday, 05/03/2014.  She is complaining of right-sided head pain, specific pain both behind her right eye, some increased tearing of her right eye, and some mild vision changes to her right eye only.  Patient presented to the emergency department on 05/03/2014 for further evaluation and management of her headache.  Labs drawn at that time were essentially within normal limits.  Eye exam with the Wood's lamp was negative.  Head CT obtained at that time was negative for acute findings.  Patient was given Benadryl and Reglan; as well as IV fluid rehydration while in the emergency department-with decrease in her headache pain.  However, patient states the headache did not completely resolved; and she continues with a dull ache to the right side of her head.  She states that her vision is no longer fuzzy; and she only has minimal tearing to her right eye.   HPI  ROS  Past Medical History  Diagnosis Date  . Cough   . Weight gain   . Hot flashes   . Insomnia, unspecified   . Depressive disorder, not elsewhere classified   . Allergic rhinitis due to pollen   . Plantar fasciitis   . Restless leg syndrome 09/09/2010  . Breast cancer   . Skin cancer   . OSA (obstructive sleep apnea) 09/09/2010    mild-no cpap recommended  . GERD (gastroesophageal reflux disease)   . Wears glasses   . Anemia     with pregnancy    Past Surgical History  Procedure Laterality Date  . Tonsillectomy    . Melanoma excision      right leg, left arm, right arm (several from each area)  . Colonoscopy    . Mastectomy w/ sentinel node  biopsy Left 03/07/2014    Procedure: LEFT TOTAL MASTECTOMY WITH LEFT SENTINEL LYMPH NODE BIOPSY;  Surgeon: Rolm Bookbinder, MD;  Location: [place] [facility];  Service: General;  Laterality: Left;  . Portacath placement Right 04/01/2014    Procedure: INSERTION PORT-A-CATH;  Surgeon: Rolm Bookbinder, MD;  Location: [place];  Service: General;  Laterality: Right;    has Depressive disorder, not elsewhere classified; Asthma, cough variant; Insomnia, unspecified; Allergic rhinitis due to pollen; OSA (obstructive sleep apnea); Restless leg syndrome; Breast cancer of upper-outer quadrant of left female breast; S/P mastectomy; Breast cancer; and Migraine on her problem list.    is allergic to hydrocodone; codeine; iodine; primidone; sulfa antibiotics; and tape.    Medication List       This list is accurate as of: 05/06/14  6:06 PM.  Always use your most recent med list.               calcium carbonate 600 MG Tabs tablet  Commonly known as:  OS-CAL  Take 600 mg by mouth daily.     cetirizine 10 MG tablet  Commonly known as:  ZYRTEC  Take 10 mg by mouth daily.     dexamethasone 4 MG tablet  Commonly known as:  DECADRON  Take 2 tablets by mouth once a day on the day after chemotherapy and then take 2 tablets  two times a day for 2 days. Take with food.     DULoxetine 30 MG capsule  Commonly known as:  CYMBALTA  Take 30 mg by mouth daily.     esomeprazole 20 MG packet  Commonly known as:  NEXIUM  Take 20 mg by mouth daily before breakfast.     eszopiclone 3 MG Tabs  Generic drug:  Eszopiclone  Take 3 mg by mouth at bedtime. Take immediately before bedtime     lidocaine-prilocaine cream  Commonly known as:  EMLA  Apply to affected area once     LORazepam 0.5 MG tablet  Commonly known as:  ATIVAN  Take 1 tablet (0.5 mg total) by mouth every 6 (six) hours as needed (Nausea or vomiting).     mometasone 50 MCG/ACT nasal spray  Commonly known as:  NASONEX  Place 2 sprays  into the nose daily.     ondansetron 8 MG tablet  Commonly known as:  ZOFRAN  Take 1 tablet (8 mg total) by mouth 2 (two) times daily as needed. Start on the third day after chemotherapy.     oxyCODONE 5 MG immediate release tablet  Commonly known as:  Oxy IR/ROXICODONE  Take 1-2 tablets (5-10 mg total) by mouth every 4 (four) hours as needed for moderate pain.     prochlorperazine 10 MG tablet  Commonly known as:  COMPAZINE  Take 1 tablet (10 mg total) by mouth every 6 (six) hours as needed (Nausea or vomiting).     SUMAtriptan 50 MG tablet  Commonly known as:  IMITREX  Take 1 tablet (50 mg total) by mouth every 2 (two) hours as needed for migraine (Max of 2 tabs per 24 hour period.).     Vitamin D (Ergocalciferol) 50000 UNITS Caps capsule  Commonly known as:  DRISDOL  Take 50,000 Units by mouth every 7 (seven) days.     VITAMIN D PO  Take 2,000 Units by mouth daily.         PHYSICAL EXAMINATION  Oncology Vitals 05/06/2014 05/03/2014 05/03/2014 05/03/2014 05/03/2014 05/03/2014 05/01/2014  Height - - - 165 cm - - 165 cm  Weight 69.718 kg - - 67.586 kg - - 69.673 kg  Weight (lbs) 153 lbs 11 oz - - 149 lbs - - 153 lbs 10 oz  BMI (kg/m2) - - - 24.79 kg/m2 - - 25.56 kg/m2  Temp 98.2 97.5 - - - 98 97.8  Pulse 83 80 77 - 75 79 80  Resp - 16 16 - 16 18 18   SpO2 100 99 99 - 100 100 100  BSA (m2) - - - 1.76 m2 - - 1.79 m2   BP Readings from Last 3 Encounters:  05/06/14 122/73  05/03/14 110/63  05/01/14 120/76    Physical Exam  Constitutional: She is oriented to person, place, and time and well-developed, well-nourished, and in no distress.  Patient suffering with alopecia.  HENT:  Head: Normocephalic and atraumatic.  Pulmonary/Chest: Effort normal. No respiratory distress.  Neurological: She is alert and oriented to person, place, and time. Gait normal.  Psychiatric: Affect normal.  Nursing note and vitals reviewed.   LABORATORY DATA:. Appointment on 05/06/2014  Component Date  Value Ref Range Status  . WBC 05/06/2014 27.4* 3.9 - 10.3 10e3/uL Final  . NEUT# 05/06/2014 25.6* 1.5 - 6.5 10e3/uL Final  . HGB 05/06/2014 10.9* 11.6 - 15.9 g/dL Final  . HCT 07/06/2014 32.8* 34.8 - 46.6 % Final  . Platelets 05/06/2014 181  145 -  400 10e3/uL Final  . MCV 05/06/2014 91.4  79.5 - 101.0 fL Final  . MCH 05/06/2014 30.4  25.1 - 34.0 pg Final  . MCHC 05/06/2014 33.2  31.5 - 36.0 g/dL Final  . RBC 05/06/2014 3.59* 3.70 - 5.45 10e6/uL Final  . RDW 05/06/2014 15.3* 11.2 - 14.5 % Final  . lymph# 05/06/2014 1.4  0.9 - 3.3 10e3/uL Final  . MONO# 05/06/2014 0.1  0.1 - 0.9 10e3/uL Final  . Eosinophils Absolute 05/06/2014 0.0  0.0 - 0.5 10e3/uL Final  . Basophils Absolute 05/06/2014 0.2* 0.0 - 0.1 10e3/uL Final  . NEUT% 05/06/2014 93.6* 38.4 - 76.8 % Final  . LYMPH% 05/06/2014 5.1* 14.0 - 49.7 % Final  . MONO% 05/06/2014 0.5  0.0 - 14.0 % Final  . EOS% 05/06/2014 0.0  0.0 - 7.0 % Final  . BASO% 05/06/2014 0.8  0.0 - 2.0 % Final  . nRBC 05/06/2014 0  0 - 0 % Final  . Sodium 05/06/2014 141  136 - 145 mEq/L Final  . Potassium 05/06/2014 4.2  3.5 - 5.1 mEq/L Final  . Chloride 05/06/2014 104  98 - 109 mEq/L Final  . CO2 05/06/2014 27  22 - 29 mEq/L Final  . Glucose 05/06/2014 89  70 - 140 mg/dl Final  . BUN 05/06/2014 11.9  7.0 - 26.0 mg/dL Final  . Creatinine 05/06/2014 0.6  0.6 - 1.1 mg/dL Final  . Total Bilirubin 05/06/2014 0.46  0.20 - 1.20 mg/dL Final  . Alkaline Phosphatase 05/06/2014 154* 40 - 150 U/L Final  . AST 05/06/2014 12  5 - 34 U/L Final  . ALT 05/06/2014 10  0 - 55 U/L Final  . Total Protein 05/06/2014 5.8* 6.4 - 8.3 g/dL Final  . Albumin 05/06/2014 3.5  3.5 - 5.0 g/dL Final  . Calcium 05/06/2014 8.5  8.4 - 10.4 mg/dL Final  . Anion Gap 05/06/2014 9  3 - 11 mEq/L Final  . EGFR 05/06/2014 >90  >90 ml/min/1.73 m2 Final   eGFR is calculated using the CKD-EPI Creatinine Equation (2009)     RADIOGRAPHIC STUDIES: Ct Head Wo Contrast  05/03/2014   CLINICAL DATA:   Headache.  Right eye pain.  Chemotherapy.  EXAM: CT HEAD WITHOUT CONTRAST  TECHNIQUE: Contiguous axial images were obtained from the base of the skull through the vertex without intravenous contrast.  COMPARISON:  None.  FINDINGS: No acute cortical infarct, hemorrhage, or mass lesion is present. The ventricles are of normal size. No significant extra-axial fluid collection is evident. The paranasal sinuses and mastoid air cells are clear. The calvarium is intact.  IMPRESSION: Negative CT of the head.   Electronically Signed   By: San Morelle M.D.   On: 05/03/2014 21:21    ASSESSMENT/PLAN:    Breast cancer of upper-outer quadrant of left female breast Patient received cycle 3 of her dose dense AC chemotherapy on 05/01/2014.  She is scheduled for cycle 4 of the same regimen on 05/15/2014.  The plan is for the patient to complete a total of 4 cycles of dose dense AC chemotherapy; and then received 12 cycles of Abraxane chemotherapy.  She will then receive radiation therapy.  Patient has an initial radiation oncology consultation scheduled for 05/09/2014.  She has plans to return to the [facility] for her next labs, visit, and chemotherapy on 05/15/2014.   Migraine Patient has a history of chronic ocular-type migraines in the past.  She states that she typically only gets these headaches once every few  years.  She developed the onset of this headache this past Friday, 05/03/2014.  She is complaining of right-sided head pain, specific pain both behind her right eye, some increased tearing of her right eye, and some mild vision changes to her right eye only.  Patient presented to the emergency department on 05/03/2014 for further evaluation and management of her headache.  Labs drawn at that time were essentially within normal limits.  Eye exam with the Wood's lamp was negative.  Head CT obtained at that time was negative for acute findings.  Patient was given Benadryl and Reglan; as well as IV  fluid rehydration while in the emergency department-with decrease in her headache pain.  However, patient states the headache did not completely resolved; and she continues with a dull ache to the right side of her head.  She states that her vision is no longer fuzzy; and she only has minimal tearing to her right eye.  On exam-patient appears neurologically intact.  Patient received migraine cocktail which consisted of Benadryl 25 mg IV, Zofran 8 mg IV, Toradol 30 mg IV, and 1 L IV fluid rehydration.  Patient states that her headache had almost completely resolved by the time she completed her IV fluids while at the cancer center.  Advised that patient try Excedrin Migraine over-the-counter at the onset of further headaches.  She was also prescribed Imitrex to try if the Excedrin did not work.  Also, patient was advised to call/return of her directly to the emergency department for any worsening symptoms whatsoever.   Patient stated understanding of all instructions; and was in agreement with this plan of care. The patient knows to call the clinic with any problems, questions or concerns.   Review/collaboration with Dr. Lindi Adie regarding all aspects of patient's visit today.   Total time spent with patient was 40 minutes;  with greater than 75 percent of that time spent in face to face counseling regarding patient's symptoms,  and coordination of care and follow up.  Disclaimer: This note was dictated with voice recognition software. Similar sounding words can inadvertently be transcribed and may not be corrected upon review.   Drue Second, NP 05/06/2014

## 2014-05-07 ENCOUNTER — Telehealth: Payer: Self-pay | Admitting: Nurse Practitioner

## 2014-05-07 NOTE — Telephone Encounter (Signed)
Called pt to f/u on migraine headaches. Pt stated she's not having any migraine headaches today and feels much better.

## 2014-05-08 ENCOUNTER — Other Ambulatory Visit: Payer: 59

## 2014-05-08 ENCOUNTER — Ambulatory Visit: Payer: 59 | Admitting: Hematology and Oncology

## 2014-05-08 NOTE — Progress Notes (Signed)
Location of Breast Cancer:Left upper-outer quadrant  Histology per Pathology Report:03/07/14  Diagnosis 1. Breast, simple mastectomy, Left - INVASIVE GRADE I DUCTAL CARCINOMA SPANNING 4.8 CM IN GREATEST DIMENSION. - ASSOCIATED LOW GRADE DUCTAL CARCINOMA IN SITU. - MARGINS ARE NEGATIVE. - SEE ONCOLOGY TEMPLATE. 2. Lymph node, sentinel, biopsy, Left axilla - ONE LYMPH NODE POSITIVE FOR METASTATIC DUCTAL CARCINOMA (1/1). 3. Lymph node, sentinel, biopsy, Left axilla - ONE BENIGN LYMPH NODE WITH NO TUMOR SEEN (0/1). 4. Lymph node, sentinel, biopsy, Left axilla - ONE BENIGN LYMPH NODE WITH NO TUMOR SEEN (0/1). 5. Lymph node, sentinel, biopsy, Left axilla - ONE BENIGN LYMPH NODE WITH NO TUMOR SEEN (0/1). Receptor Status: ER(+), PR (+), Her2-neu ( no amplification)  Did patient present with symptoms (if so, please note symptoms) or was this found on screening mammography?:palpated by patient. Panel 1: Left LEFT TOTAL MASTECTOMY WITH LEFT SENTINEL LYMPH NODE BIOPSY with Rolm Bookbinder, MD Past/Anticipated interventions by surgeon, if any:03/07/14   Past/Anticipated interventions by medical oncology, if any: Chemotherapy:Adjuvant chemotherapy with dose dense adriamycin and cytoxan followed by weekly abraxane x12. Has completed 3 rounds of chemotherapy.Scheduled to complete chemotherapy in July 2016.  Lymphedema issues, if any:No     Pain issues, if any: No  SAFETY ISSUES:  Prior radiation? No  Pacemaker/ICD?No  Possible current pregnancy? No.post-menopausal  Is the patient on methotrexate? No  Current Complaints / other detailsMenarche age 23, first child age 63. 2 daughters.Hormonal replacement therapy for 15 years. Last menstrual period in late 40's. Allergies;codeine, iodine,primidone, sulfa and tape.  Wants to discuss treatment plan as family planning for vacation after all treatment is complete.    Theresa Repress, RN 05/08/2014,4:44 PM

## 2014-05-09 ENCOUNTER — Ambulatory Visit
Admission: RE | Admit: 2014-05-09 | Discharge: 2014-05-09 | Disposition: A | Discharge status: Home / Self Care | Payer: 59 | Source: Ambulatory Visit | Attending: Radiation Oncology | Admitting: Radiation Oncology

## 2014-05-09 ENCOUNTER — Encounter: Payer: Self-pay | Admitting: Radiation Oncology

## 2014-05-09 VITALS — BP 112/77 | HR 90 | Temp 98.2°F | Wt 154.1 lb

## 2014-05-09 DIAGNOSIS — C50412 Malignant neoplasm of upper-outer quadrant of left female breast: Secondary | ICD-10-CM | POA: Diagnosis not present

## 2014-05-09 NOTE — Progress Notes (Signed)
   Department of Radiation Oncology  Phone:  [phone] Fax:        [phone]   Name: Theresa Huff MRN: [id number]  DOB: 19-Apr-1950  Date: 05/09/2014  Follow Up Visit Note  Diagnosis: Breast cancer of upper-outer quadrant of left female breast   Staging form: Breast, AJCC 7th Edition     Clinical stage from 02/13/2014: Stage IIB (T3, N0, M0) - Unsigned       Staging comments: Staged at breast conference on 1.20.16      Pathologic stage from 03/11/2014: Stage IIB (T2, N1a, cM0) - Signed by Seward Grater, MD on 03/19/2014       Staging comments: Staged on final mastectomy specimen by Dr. Avis Epley  Interval History: Theresa Huff presents today for routine followup.  She had her mastectomy on February 15th. She ended up with a 4.8 cm tumor with negative margins. She had associated DCIS and 1/4 lymph nodes was positive with a macromet. She has started chemotherapy. She asked to meet with me to get a sense of her "schedule" over the next few months including radiation. She is accompanied by her daughter.   Physical Exam:  Filed Vitals:   05/09/14 1025  BP: 112/77  Pulse: 90  Temp: 98.2 F (36.8 C)  Weight: 154 lb 1.6 oz (69.899 kg)   Alopecia. Alert and oriented  IMPRESSION: Theresa Huff is a 64 y.o. female s/p mastectomy  PLAN:  I spoke to the patient today regarding her diagnosis and options for treatment. We discussed the equivalence in terms of survival and local failure between mastectomy and breast conservation. We discussed the role of radiation in decreasing local failures in patients who undergo mastectomy and have risk factors for recurrence including positive lymph nodes and/or tumors over 5 cm and/or positive margins. We discussed the process of simulation and the placement tattoos. We discussed 6 weeks of treatment as an outpatient. We discussed the possibility of asymptomatic lung damage. We discussed the low likelihood of secondary malignancies. We discussed the possible side effects  including but not limited to skin redness, fatigue, permanent skin darkening, and chest wall swelling. We discussed increased complications that can occur with reconstruction after radiation.   She is going to finish chemotherapy as she calculates it the first week of July.  She can then plan a trip or whatever she likes and we will plan to start radiation the first or second week of August.  She can just call to make her simulation appointment and I have given her that number.  Obviously if her chemotherapy is truncated we can start earlier.   I will plan on treating the chest wall, sclv and PAB without a boost to the scar.   [name], MD

## 2014-05-10 ENCOUNTER — Encounter: Payer: Self-pay | Admitting: Hematology and Oncology

## 2014-05-13 ENCOUNTER — Telehealth: Payer: Self-pay

## 2014-05-13 NOTE — Telephone Encounter (Signed)
Call to pt in response to message re: eye exam.  Let pt know that she should wait and discuss with Dr. Lindi Adie on Wednesday since she has had problems/side effects with her eyes already.  Pt voiced udnerstanding.

## 2014-05-14 NOTE — Assessment & Plan Note (Signed)
Left breast invasive ductal carcinoma grade 1; 4.8 cm with low-grade DCIS; margins negative 1/4 lymph nodes positive, ER 100% PR 100% HER-2 negative ratio 1.18 Ki-67 26% and 19% T2 N1 stage IIB  Treatment plan:  1. Systemic chemotherapy with dose dense Adriamycin and Cytoxan 4 followed by Abraxane weekly 12 2. Followed by adjuvant radiation 3. Followed by adjuvant antiestrogen therapy with anastrozole for 5 years  Current treatment: Cycle 4 day 1 dose dense Adriamycin and Cytoxan  Chemotherapy toxicities: 1. Profound fatigue for 2-3 days after chemotherapy starting day 4 2. Grade 4 neutropenia: decreased the dosage of cycle 2 chemotherapy of Adriamycin 50 mg/m and Cytoxan 500 mg/m 3. Thrombocytopenia grade 1  I reviewed her blood work from today. Her ANC is 1500. Her platelet counts are also normal. So she is tolerating this current dosage very well. We will keep her at this dosage level. She will not need to come the week after for nadir count checks.  Closely monitoring for chemotherapy toxicities. Return to clinic in 2 weeks for 1st cycle of Abraxane.

## 2014-05-15 ENCOUNTER — Ambulatory Visit (HOSPITAL_BASED_OUTPATIENT_CLINIC_OR_DEPARTMENT_OTHER): Payer: 59 | Admitting: Hematology and Oncology

## 2014-05-15 ENCOUNTER — Ambulatory Visit (HOSPITAL_BASED_OUTPATIENT_CLINIC_OR_DEPARTMENT_OTHER): Payer: 59

## 2014-05-15 ENCOUNTER — Telehealth: Payer: Self-pay | Admitting: Hematology and Oncology

## 2014-05-15 ENCOUNTER — Other Ambulatory Visit (HOSPITAL_BASED_OUTPATIENT_CLINIC_OR_DEPARTMENT_OTHER): Payer: 59

## 2014-05-15 ENCOUNTER — Ambulatory Visit: Payer: 59 | Admitting: Nurse Practitioner

## 2014-05-15 VITALS — BP 118/69 | HR 98 | Temp 98.1°F | Resp 18 | Ht 65.0 in | Wt 155.2 lb

## 2014-05-15 DIAGNOSIS — D701 Agranulocytosis secondary to cancer chemotherapy: Secondary | ICD-10-CM | POA: Diagnosis not present

## 2014-05-15 DIAGNOSIS — Z5111 Encounter for antineoplastic chemotherapy: Secondary | ICD-10-CM

## 2014-05-15 DIAGNOSIS — C50412 Malignant neoplasm of upper-outer quadrant of left female breast: Secondary | ICD-10-CM

## 2014-05-15 DIAGNOSIS — C773 Secondary and unspecified malignant neoplasm of axilla and upper limb lymph nodes: Secondary | ICD-10-CM

## 2014-05-15 DIAGNOSIS — Z170 Estrogen receptor positive status [ER+]: Secondary | ICD-10-CM

## 2014-05-15 DIAGNOSIS — D72829 Elevated white blood cell count, unspecified: Secondary | ICD-10-CM

## 2014-05-15 DIAGNOSIS — D6959 Other secondary thrombocytopenia: Secondary | ICD-10-CM | POA: Diagnosis not present

## 2014-05-15 LAB — COMPREHENSIVE METABOLIC PANEL (CC13)
ALBUMIN: 3.8 g/dL (ref 3.5–5.0)
ALK PHOS: 83 U/L (ref 40–150)
ALT: 13 U/L (ref 0–55)
AST: 11 U/L (ref 5–34)
Anion Gap: 11 mEq/L (ref 3–11)
BUN: 9.2 mg/dL (ref 7.0–26.0)
CO2: 25 mEq/L (ref 22–29)
CREATININE: 0.8 mg/dL (ref 0.6–1.1)
Calcium: 9.4 mg/dL (ref 8.4–10.4)
Chloride: 108 mEq/L (ref 98–109)
EGFR: 86 mL/min/{1.73_m2} — ABNORMAL LOW (ref >90)
Glucose: 72 mg/dl (ref 70–140)
Potassium: 5.1 mEq/L (ref 3.5–5.1)
SODIUM: 143 meq/L (ref 136–145)
Total Protein: 6.3 g/dL — ABNORMAL LOW (ref 6.4–8.3)

## 2014-05-15 LAB — CBC WITH DIFFERENTIAL/PLATELET
BASO%: 1 % (ref 0.0–2.0)
BASOS ABS: 0.1 10*3/uL (ref 0.0–0.1)
EOS ABS: 0 10*3/uL (ref 0.0–0.5)
EOS%: 0.4 % (ref 0.0–7.0)
HEMATOCRIT: 35.4 % (ref 34.8–46.6)
HGB: 11.4 g/dL — ABNORMAL LOW (ref 11.6–15.9)
LYMPH%: 21.1 % (ref 14.0–49.7)
MCH: 30.4 pg (ref 25.1–34.0)
MCHC: 32.2 g/dL (ref 31.5–36.0)
MCV: 94.4 fL (ref 79.5–101.0)
MONO#: 1 10*3/uL — ABNORMAL HIGH (ref 0.1–0.9)
MONO%: 14.1 % — AB (ref 0.0–14.0)
NEUT#: 4.3 10*3/uL (ref 1.5–6.5)
NEUT%: 63.4 % (ref 38.4–76.8)
Platelets: 239 10*3/uL (ref 145–400)
RBC: 3.75 10*6/uL (ref 3.70–5.45)
RDW: 16.3 % — AB (ref 11.2–14.5)
WBC: 6.7 10*3/uL (ref 3.9–10.3)
lymph#: 1.4 10*3/uL (ref 0.9–3.3)

## 2014-05-15 MED ORDER — SODIUM CHLORIDE 0.9 % IV SOLN
Freq: Once | INTRAVENOUS | Status: AC
  Administered 2014-05-15: 11:00:00 via INTRAVENOUS
  Filled 2014-05-15: qty 5

## 2014-05-15 MED ORDER — PEGFILGRASTIM 6 MG/0.6ML ~~LOC~~ PSKT
6.0000 mg | PREFILLED_SYRINGE | Freq: Once | SUBCUTANEOUS | Status: AC
  Administered 2014-05-15: 6 mg via SUBCUTANEOUS
  Filled 2014-05-15: qty 0.6

## 2014-05-15 MED ORDER — PALONOSETRON HCL INJECTION 0.25 MG/5ML
INTRAVENOUS | Status: AC
  Filled 2014-05-15: qty 5

## 2014-05-15 MED ORDER — PALONOSETRON HCL INJECTION 0.25 MG/5ML
0.2500 mg | Freq: Once | INTRAVENOUS | Status: AC
  Administered 2014-05-15: 0.25 mg via INTRAVENOUS

## 2014-05-15 MED ORDER — DOXORUBICIN HCL CHEMO IV INJECTION 2 MG/ML
50.0000 mg/m2 | Freq: Once | INTRAVENOUS | Status: AC
  Administered 2014-05-15: 88 mg via INTRAVENOUS
  Filled 2014-05-15: qty 44

## 2014-05-15 MED ORDER — SODIUM CHLORIDE 0.9 % IV SOLN
500.0000 mg/m2 | Freq: Once | INTRAVENOUS | Status: AC
  Administered 2014-05-15: 880 mg via INTRAVENOUS
  Filled 2014-05-15: qty 44

## 2014-05-15 MED ORDER — SODIUM CHLORIDE 0.9 % IJ SOLN
10.0000 mL | INTRAMUSCULAR | Status: DC | PRN
  Administered 2014-05-15: 10 mL
  Filled 2014-05-15: qty 10

## 2014-05-15 MED ORDER — SODIUM CHLORIDE 0.9 % IV SOLN
Freq: Once | INTRAVENOUS | Status: AC
  Administered 2014-05-15: 11:00:00 via INTRAVENOUS

## 2014-05-15 MED ORDER — HEPARIN SOD (PORK) LOCK FLUSH 100 UNIT/ML IV SOLN
500.0000 [IU] | Freq: Once | INTRAVENOUS | Status: AC | PRN
  Administered 2014-05-15: 500 [IU]
  Filled 2014-05-15: qty 5

## 2014-05-15 NOTE — Patient Instructions (Signed)
[facility] Discharge Instructions for Patients Receiving Chemotherapy  Today you received the following chemotherapy agents Adria and Cytoxan  To help prevent nausea and vomiting after your treatment, we encourage you to take your nausea medication as prescribed.   If you develop nausea and vomiting that is not controlled by your nausea medication, call the clinic.   BELOW ARE SYMPTOMS THAT SHOULD BE REPORTED IMMEDIATELY:  *FEVER GREATER THAN 100.5 F  *CHILLS WITH OR WITHOUT FEVER  NAUSEA AND VOMITING THAT IS NOT CONTROLLED WITH YOUR NAUSEA MEDICATION  *UNUSUAL SHORTNESS OF BREATH  *UNUSUAL BRUISING OR BLEEDING  TENDERNESS IN MOUTH AND THROAT WITH OR WITHOUT PRESENCE OF ULCERS  *URINARY PROBLEMS  *BOWEL PROBLEMS  UNUSUAL RASH Items with * indicate a potential emergency and should be followed up as soon as possible.  Feel free to call the clinic you have any questions or concerns. The clinic phone number is [phone].  Please show the CHEMO ALERT CARD at check-in to the Emergency Department and triage nurse.   

## 2014-05-15 NOTE — Progress Notes (Signed)
Patient Care Team: Antony Contras, MD as PCP - General (Family Medicine) Elsie Stain, MD (Pulmonary Disease) Rolm Bookbinder, MD as Consulting Physician (General Surgery) Nicholas Lose, MD as Consulting Physician (Hematology and Oncology) [name], MD as Consulting Physician (Radiation Oncology) Rockwell Germany, RN as Registered Nurse Mauro Kaufmann, RN as Registered Nurse Holley Bouche, NP as Nurse Practitioner (Nurse Practitioner)  DIAGNOSIS: Breast cancer of upper-outer quadrant of left female breast   Staging form: Breast, AJCC 7th Edition     Clinical stage from 02/13/2014: Stage IIB (T3, N0, M0) - Unsigned       Staging comments: Staged at breast conference on 1.20.16      Pathologic stage from 03/11/2014: Stage IIB (T2, N1a, cM0) - Signed by Seward Grater, MD on 03/19/2014       Staging comments: Staged on final mastectomy specimen by Dr. Avis Epley    SUMMARY OF ONCOLOGIC HISTORY:   Breast cancer of upper-outer quadrant of left female breast   02/04/2014 Initial Diagnosis left breast: Invasive ductal carcinoma with DCIS ER 100%, PR 100%, HER-2 negative, Ki-67 ranged from 19-26%; third biopsy showed invasive mammary cancer with lobular features and intraductal papilloma, ER/PR positive HER-2 negative   02/11/2014 Breast MRI left breast: 3 masses that are confluent measuring 5.6 x 2.7 x 3 cm extends to the left aspect of area along the penis to involve skin of the area no lymph nodes detected right breast negative   03/07/2014 Surgery Left breast invasive ductal carcinoma grade 1; 4.8 cm with low-grade DCIS; margins negative 1/4 lymph nodes positive, ER 100% PR 100% HER-2 negative ratio 1.18 Ki-67 26% and 19% T2 N1 stage IIB   04/03/2014 -  Chemotherapy Adjuvant chemotherapy with dose dense Adriamycin and Cytoxan followed by weekly Abraxane 12    CHIEF COMPLIANT: Cycle 4 Adriamycin and Cytoxan  INTERVAL HISTORY: Theresa Huff is a  64 year old lady with above-mentioned  history of left-sided breast cancer currently on adjuvant chemotherapy. Today is her fourth cycle of Adriamycin and Cytoxan. After cycle 3 she had a major episode of migraine. She reports that she had ocular migraines previously. She was seen by our nurse practitioner and was treated for migraine. Her symptoms did improve after 3-4 days. Finally her symptoms resolved currently. She has had alopecia. Denies any nausea vomiting denies any abdominal symptoms. She has normal bowels.  REVIEW OF SYSTEMS:   Constitutional: Denies fevers, chills or abnormal weight loss Eyes: Denies blurriness of vision Ears, nose, mouth, throat, and face: Denies mucositis or sore throat Respiratory: Denies cough, dyspnea or wheezes Cardiovascular: Denies palpitation, chest discomfort or lower extremity swelling Gastrointestinal:  Denies nausea, heartburn or change in bowel habits Skin: Denies abnormal skin rashes Lymphatics: Denies new lymphadenopathy or easy bruising Neurological:Denies numbness, tingling or new weaknesses Behavioral/Psych: Mood is stable, no new changes  All other systems were reviewed with the patient and are negative.  I have reviewed the past medical history, past surgical history, social history and family history with the patient and they are unchanged from previous note.  ALLERGIES:  is allergic to hydrocodone; codeine; iodine; primidone; sulfa antibiotics; and tape.  MEDICATIONS:  Current Outpatient Prescriptions  Medication Sig Dispense Refill  . calcium carbonate (OS-CAL) 600 MG TABS tablet Take 600 mg by mouth daily.    . cetirizine (ZYRTEC) 10 MG tablet Take 10 mg by mouth daily.      . Cholecalciferol (VITAMIN D PO) Take 2,000 Units by mouth daily.    Marland Kitchen  dexamethasone (DECADRON) 4 MG tablet Take 2 tablets by mouth once a day on the day after chemotherapy and then take 2 tablets two times a day for 2 days. Take with food. 30 tablet 1  . DULoxetine (CYMBALTA) 30 MG capsule Take 30 mg by  mouth daily.     Marland Kitchen esomeprazole (NEXIUM) 20 MG packet Take 20 mg by mouth daily before breakfast.    . Eszopiclone (ESZOPICLONE) 3 MG TABS Take 3 mg by mouth at bedtime. Take immediately before bedtime    . lidocaine-prilocaine (EMLA) cream Apply to affected area once 30 g 3  . LORazepam (ATIVAN) 0.5 MG tablet Take 1 tablet (0.5 mg total) by mouth every 6 (six) hours as needed (Nausea or vomiting). 30 tablet 0  . mometasone (NASONEX) 50 MCG/ACT nasal spray Place 2 sprays into the nose daily.      . ondansetron (ZOFRAN) 8 MG tablet Take 1 tablet (8 mg total) by mouth 2 (two) times daily as needed. Start on the third day after chemotherapy. 30 tablet 1  . oxyCODONE (OXY IR/ROXICODONE) 5 MG immediate release tablet Take 1-2 tablets (5-10 mg total) by mouth every 4 (four) hours as needed for moderate pain. 30 tablet 0  . prochlorperazine (COMPAZINE) 10 MG tablet Take 1 tablet (10 mg total) by mouth every 6 (six) hours as needed (Nausea or vomiting). 30 tablet 1  . SUMAtriptan (IMITREX) 50 MG tablet Take 1 tablet (50 mg total) by mouth every 2 (two) hours as needed for migraine (Max of 2 tabs per 24 hour period.). 10 tablet 0  . Vitamin D, Ergocalciferol, (DRISDOL) 50000 UNITS CAPS capsule Take 50,000 Units by mouth every 7 (seven) days.     No current facility-administered medications for this visit.    PHYSICAL EXAMINATION: ECOG PERFORMANCE STATUS: 1 - Symptomatic but completely ambulatory  Filed Vitals:   05/15/14 0833  BP: 118/69  Pulse: 98  Temp: 98.1 F (36.7 C)  Resp: 18   Filed Weights   05/15/14 0833  Weight: 155 lb 3.2 oz (70.398 kg)    GENERAL:alert, no distress and comfortable SKIN: skin color, texture, turgor are normal, no rashes or significant lesions EYES: normal, Conjunctiva are pink and non-injected, sclera clear OROPHARYNX:no exudate, no erythema and lips, buccal mucosa, and tongue normal  NECK: supple, thyroid normal size, non-tender, without nodularity LYMPH:  no  palpable lymphadenopathy in the cervical, axillary or inguinal LUNGS: clear to auscultation and percussion with normal breathing effort HEART: regular rate & rhythm and no murmurs and no lower extremity edema ABDOMEN:abdomen soft, non-tender and normal bowel sounds Musculoskeletal:no cyanosis of digits and no clubbing  NEURO: alert & oriented x 3 with fluent speech, no focal motor/sensory deficits  LABORATORY DATA:  I have reviewed the data as listed   Chemistry      Component Value Date/Time   NA 143 05/15/2014 0822   NA 139 05/03/2014 1946   K 5.1 05/15/2014 0822   K 4.1 05/03/2014 1946   CL 103 05/03/2014 1946   CO2 25 05/15/2014 0822   CO2 26 05/03/2014 1946   BUN 9.2 05/15/2014 0822   BUN 17 05/03/2014 1946   CREATININE 0.8 05/15/2014 0822   CREATININE 0.77 05/03/2014 1946      Component Value Date/Time   CALCIUM 9.4 05/15/2014 0822   CALCIUM 9.6 05/03/2014 1946   ALKPHOS 83 05/15/2014 0822   ALKPHOS 76 09/14/2010 1708   AST 11 05/15/2014 0822   AST 18 09/14/2010 1708   ALT  13 05/15/2014 0822   ALT 21 09/14/2010 1708   BILITOT <0.20 05/15/2014 0822   BILITOT 0.4 09/14/2010 1708       Lab Results  Component Value Date   WBC 6.7 05/15/2014   HGB 11.4* 05/15/2014   HCT 35.4 05/15/2014   MCV 94.4 05/15/2014   PLT 239 05/15/2014   NEUTROABS 4.3 05/15/2014    ASSESSMENT & PLAN:  Breast cancer of upper-outer quadrant of left female breast Left breast invasive ductal carcinoma grade 1; 4.8 cm with low-grade DCIS; margins negative 1/4 lymph nodes positive, ER 100% PR 100% HER-2 negative ratio 1.18 Ki-67 26% and 19% T2 N1 stage IIB  Treatment plan:  1. Systemic chemotherapy with dose dense Adriamycin and Cytoxan 4 followed by Abraxane weekly 12 2. Followed by adjuvant radiation 3. Followed by adjuvant antiestrogen therapy with anastrozole for 5 years  Current treatment: Cycle 4 day 1 dose dense Adriamycin and Cytoxan  Chemotherapy toxicities: 1. Profound  fatigue for 2-3 days after chemotherapy starting day 4 2. Grade 4 neutropenia: decreased the dosage of cycle 2 chemotherapy of Adriamycin 50 mg/m and Cytoxan 500 mg/m 3. Thrombocytopenia grade 1 4. Ocular migraine after cycle 3 Adriamycin and Cytoxan 5. Profound leukocytosis white count 111,000 due to Neulasta  I reviewed her blood work from today and they're adequate for treatment.  Closely monitoring for chemotherapy toxicities. Return to clinic in 2 weeks for 1st cycle of Abraxane.  No orders of the defined types were placed in this encounter.   The patient has a good understanding of the overall plan. she agrees with it. She will call with any problems that may develop before her next visit here.   Rulon Eisenmenger, MD

## 2014-05-15 NOTE — Telephone Encounter (Signed)
Appointments already on schedule

## 2014-05-27 ENCOUNTER — Other Ambulatory Visit: Payer: Self-pay | Admitting: Dermatology

## 2014-05-27 DIAGNOSIS — C4492 Squamous cell carcinoma of skin, unspecified: Secondary | ICD-10-CM

## 2014-05-27 HISTORY — DX: Squamous cell carcinoma of skin, unspecified: C44.92

## 2014-05-28 NOTE — Assessment & Plan Note (Signed)
Left breast invasive ductal carcinoma grade 1; 4.8 cm with low-grade DCIS; margins negative 1/4 lymph nodes positive, ER 100% PR 100% HER-2 negative ratio 1.18 Ki-67 26% and 19% T2 N1 stage IIB  Treatment plan:  1. Systemic chemotherapy with dose dense Adriamycin and Cytoxan 4 followed by Abraxane weekly 12 2. Followed by adjuvant radiation 3. Followed by adjuvant antiestrogen therapy with anastrozole for 5 years  Current treatment: Cycle 1/12 Abraxane  Chemotherapy toxicities: 1. Profound fatigue for 2-3 days after chemotherapy starting day 4 2. Grade 4 neutropenia: decreased the dosage of cycle 2 chemotherapy of Adriamycin 50 mg/m and Cytoxan 500 mg/m 3. Thrombocytopenia grade 1 4. Ocular migraine after cycle 3 Adriamycin and Cytoxan 5. Profound leukocytosis white count 111,000 due to Neulasta  I reviewed her blood work from today and they're adequate for treatment.  Closely monitoring for chemotherapy toxicities. Return to clinic in 2 weeks for 3rd cycle of Abraxane.

## 2014-05-29 ENCOUNTER — Other Ambulatory Visit (HOSPITAL_BASED_OUTPATIENT_CLINIC_OR_DEPARTMENT_OTHER): Payer: 59

## 2014-05-29 ENCOUNTER — Telehealth: Payer: Self-pay | Admitting: Hematology and Oncology

## 2014-05-29 ENCOUNTER — Ambulatory Visit (HOSPITAL_BASED_OUTPATIENT_CLINIC_OR_DEPARTMENT_OTHER): Payer: 59 | Admitting: Hematology and Oncology

## 2014-05-29 ENCOUNTER — Ambulatory Visit (HOSPITAL_BASED_OUTPATIENT_CLINIC_OR_DEPARTMENT_OTHER): Payer: 59

## 2014-05-29 VITALS — BP 115/67 | HR 83 | Temp 98.4°F | Resp 18 | Ht 65.0 in | Wt 156.2 lb

## 2014-05-29 DIAGNOSIS — Z5111 Encounter for antineoplastic chemotherapy: Secondary | ICD-10-CM

## 2014-05-29 DIAGNOSIS — C773 Secondary and unspecified malignant neoplasm of axilla and upper limb lymph nodes: Secondary | ICD-10-CM | POA: Diagnosis not present

## 2014-05-29 DIAGNOSIS — C50412 Malignant neoplasm of upper-outer quadrant of left female breast: Secondary | ICD-10-CM

## 2014-05-29 DIAGNOSIS — C701 Malignant neoplasm of spinal meninges: Secondary | ICD-10-CM

## 2014-05-29 DIAGNOSIS — D6959 Other secondary thrombocytopenia: Secondary | ICD-10-CM | POA: Diagnosis not present

## 2014-05-29 DIAGNOSIS — Z170 Estrogen receptor positive status [ER+]: Secondary | ICD-10-CM

## 2014-05-29 DIAGNOSIS — D72829 Elevated white blood cell count, unspecified: Secondary | ICD-10-CM

## 2014-05-29 DIAGNOSIS — G43809 Other migraine, not intractable, without status migrainosus: Secondary | ICD-10-CM

## 2014-05-29 LAB — CBC WITH DIFFERENTIAL/PLATELET
BASO%: 1.1 % (ref 0.0–2.0)
Basophils Absolute: 0.1 10*3/uL (ref 0.0–0.1)
EOS%: 1.1 % (ref 0.0–7.0)
Eosinophils Absolute: 0.1 10*3/uL (ref 0.0–0.5)
HCT: 34.3 % — ABNORMAL LOW (ref 34.8–46.6)
HGB: 11.3 g/dL — ABNORMAL LOW (ref 11.6–15.9)
LYMPH%: 17 % (ref 14.0–49.7)
MCH: 30.8 pg (ref 25.1–34.0)
MCHC: 33 g/dL (ref 31.5–36.0)
MCV: 93.2 fL (ref 79.5–101.0)
MONO#: 0.9 10*3/uL (ref 0.1–0.9)
MONO%: 14.8 % — AB (ref 0.0–14.0)
NEUT#: 4.1 10*3/uL (ref 1.5–6.5)
NEUT%: 66 % (ref 38.4–76.8)
Platelets: 237 10*3/uL (ref 145–400)
RBC: 3.68 10*6/uL — ABNORMAL LOW (ref 3.70–5.45)
RDW: 18.9 % — ABNORMAL HIGH (ref 11.2–14.5)
WBC: 6.2 10*3/uL (ref 3.9–10.3)
lymph#: 1.1 10*3/uL (ref 0.9–3.3)

## 2014-05-29 LAB — COMPREHENSIVE METABOLIC PANEL (CC13)
ALT: 12 U/L (ref 0–55)
ANION GAP: 12 meq/L — AB (ref 3–11)
AST: 10 U/L (ref 5–34)
Albumin: 3.7 g/dL (ref 3.5–5.0)
Alkaline Phosphatase: 84 U/L (ref 40–150)
BILIRUBIN TOTAL: 0.26 mg/dL (ref 0.20–1.20)
BUN: 9 mg/dL (ref 7.0–26.0)
CHLORIDE: 106 meq/L (ref 98–109)
CO2: 26 mEq/L (ref 22–29)
CREATININE: 0.7 mg/dL (ref 0.6–1.1)
Calcium: 9.5 mg/dL (ref 8.4–10.4)
EGFR: 87 mL/min/{1.73_m2} — ABNORMAL LOW (ref >90)
Glucose: 83 mg/dl (ref 70–140)
Potassium: 5 mEq/L (ref 3.5–5.1)
Sodium: 143 mEq/L (ref 136–145)
TOTAL PROTEIN: 6.3 g/dL — AB (ref 6.4–8.3)

## 2014-05-29 MED ORDER — SODIUM CHLORIDE 0.9 % IV SOLN
Freq: Once | INTRAVENOUS | Status: AC
  Administered 2014-05-29: 10:00:00 via INTRAVENOUS
  Filled 2014-05-29: qty 4

## 2014-05-29 MED ORDER — SODIUM CHLORIDE 0.9 % IV SOLN
Freq: Once | INTRAVENOUS | Status: AC
  Administered 2014-05-29: 10:00:00 via INTRAVENOUS

## 2014-05-29 MED ORDER — HEPARIN SOD (PORK) LOCK FLUSH 100 UNIT/ML IV SOLN
500.0000 [IU] | Freq: Once | INTRAVENOUS | Status: AC | PRN
  Administered 2014-05-29: 500 [IU]
  Filled 2014-05-29: qty 5

## 2014-05-29 MED ORDER — PACLITAXEL PROTEIN-BOUND CHEMO INJECTION 100 MG
80.0000 mg/m2 | Freq: Once | INTRAVENOUS | Status: AC
  Administered 2014-05-29: 150 mg via INTRAVENOUS
  Filled 2014-05-29: qty 30

## 2014-05-29 MED ORDER — ONDANSETRON 8 MG/50ML IVPB (CHCC)
8.0000 mg | Freq: Once | INTRAVENOUS | Status: DC
  Filled 2014-05-29: qty 8

## 2014-05-29 MED ORDER — SODIUM CHLORIDE 0.9 % IJ SOLN
10.0000 mL | INTRAMUSCULAR | Status: DC | PRN
  Administered 2014-05-29: 10 mL
  Filled 2014-05-29: qty 10

## 2014-05-29 NOTE — Progress Notes (Signed)
Patient Care Team: Antony Contras, MD as PCP - General (Family Medicine) Elsie Stain, MD (Pulmonary Disease) Rolm Bookbinder, MD as Consulting Physician (General Surgery) Nicholas Lose, MD as Consulting Physician (Hematology and Oncology) [name], MD as Consulting Physician (Radiation Oncology) Rockwell Germany, RN as Registered Nurse Mauro Kaufmann, RN as Registered Nurse Holley Bouche, NP as Nurse Practitioner (Nurse Practitioner)  DIAGNOSIS: Breast cancer of upper-outer quadrant of left female breast   Staging form: Breast, AJCC 7th Edition     Clinical stage from 02/13/2014: Stage IIB (T3, N0, M0) - Unsigned       Staging comments: Staged at breast conference on 1.20.16      Pathologic stage from 03/11/2014: Stage IIB (T2, N1a, cM0) - Signed by Seward Grater, MD on 03/19/2014       Staging comments: Staged on final mastectomy specimen by Dr. Avis Epley    SUMMARY OF ONCOLOGIC HISTORY:   Breast cancer of upper-outer quadrant of left female breast   02/04/2014 Initial Diagnosis left breast: Invasive ductal carcinoma with DCIS ER 100%, PR 100%, HER-2 negative, Ki-67 ranged from 19-26%; third biopsy showed invasive mammary cancer with lobular features and intraductal papilloma, ER/PR positive HER-2 negative   02/11/2014 Breast MRI left breast: 3 masses that are confluent measuring 5.6 x 2.7 x 3 cm extends to the left aspect of area along the penis to involve skin of the area no lymph nodes detected right breast negative   03/07/2014 Surgery Left breast invasive ductal carcinoma grade 1; 4.8 cm with low-grade DCIS; margins negative 1/4 lymph nodes positive, ER 100% PR 100% HER-2 negative ratio 1.18 Ki-67 26% and 19% T2 N1 stage IIB   04/03/2014 -  Chemotherapy Adjuvant chemotherapy with dose dense Adriamycin and Cytoxan followed by weekly Abraxane 12    CHIEF COMPLIANT: Cycle 1 of Abraxane  INTERVAL HISTORY: Theresa Huff is a 64 year old with above-mentioned history of left  breast cancer currently on adjuvant chemotherapy and will start weekly Abraxane today. She done fairly well from the last cycle of chemotherapy apart from [facility] horses and spots on her fingers as well as loss of finger prints. Denies any nausea vomiting but did have a couple of episodes of loose stools but because she is mostly constipated she does not mind that.  REVIEW OF SYSTEMS:   Constitutional: Denies fevers, chills or abnormal weight loss Eyes: Denies blurriness of vision Ears, nose, mouth, throat, and face: Denies mucositis or sore throat Respiratory: Denies cough, dyspnea or wheezes Cardiovascular: Denies palpitation, chest discomfort or lower extremity swelling Gastrointestinal:  Denies nausea, heartburn or change in bowel habits Skin: Denies abnormal skin rashes Lymphatics: Denies new lymphadenopathy or easy bruising Neurological:Denies numbness, tingling or new weaknesses Behavioral/Psych: Mood is stable, no new changes  Breast:  denies any pain or lumps or nodules in either breasts All other systems were reviewed with the patient and are negative.  I have reviewed the past medical history, past surgical history, social history and family history with the patient and they are unchanged from previous note.  ALLERGIES:  is allergic to hydrocodone; codeine; iodine; primidone; sulfa antibiotics; and tape.  MEDICATIONS:  Current Outpatient Prescriptions  Medication Sig Dispense Refill  . calcium carbonate (OS-CAL) 600 MG TABS tablet Take 600 mg by mouth daily.    . cetirizine (ZYRTEC) 10 MG tablet Take 10 mg by mouth daily.      . Cholecalciferol (VITAMIN D PO) Take 2,000 Units by mouth daily.    Marland Kitchen  DULoxetine (CYMBALTA) 30 MG capsule Take 30 mg by mouth daily.     Marland Kitchen esomeprazole (NEXIUM) 20 MG packet Take 20 mg by mouth daily before breakfast.    . Eszopiclone (ESZOPICLONE) 3 MG TABS Take 3 mg by mouth at bedtime. Take immediately before bedtime    . mometasone (NASONEX) 50  MCG/ACT nasal spray Place 2 sprays into the nose daily.      Marland Kitchen oxyCODONE (OXY IR/ROXICODONE) 5 MG immediate release tablet Take 1-2 tablets (5-10 mg total) by mouth every 4 (four) hours as needed for moderate pain. 30 tablet 0  . SUMAtriptan (IMITREX) 50 MG tablet Take 1 tablet (50 mg total) by mouth every 2 (two) hours as needed for migraine (Max of 2 tabs per 24 hour period.). 10 tablet 0  . Vitamin D, Ergocalciferol, (DRISDOL) 50000 UNITS CAPS capsule Take 50,000 Units by mouth every 7 (seven) days.     No current facility-administered medications for this visit.    PHYSICAL EXAMINATION: ECOG PERFORMANCE STATUS: 1 - Symptomatic but completely ambulatory  Filed Vitals:   05/29/14 0817  BP: 115/67  Pulse: 83  Temp: 98.4 F (36.9 C)  Resp: 18   Filed Weights   05/29/14 0817  Weight: 156 lb 3.2 oz (70.852 kg)    GENERAL:alert, no distress and comfortable SKIN: skin color, texture, turgor are normal, no rashes or significant lesions EYES: normal, Conjunctiva are pink and non-injected, sclera clear OROPHARYNX:no exudate, no erythema and lips, buccal mucosa, and tongue normal  NECK: supple, thyroid normal size, non-tender, without nodularity LYMPH:  no palpable lymphadenopathy in the cervical, axillary or inguinal LUNGS: clear to auscultation and percussion with normal breathing effort HEART: regular rate & rhythm and no murmurs and no lower extremity edema ABDOMEN:abdomen soft, non-tender and normal bowel sounds Musculoskeletal:no cyanosis of digits and no clubbing  NEURO: alert & oriented x 3 with fluent speech, no focal motor/sensory deficits  LABORATORY DATA:  I have reviewed the data as listed   Chemistry      Component Value Date/Time   NA 143 05/15/2014 0822   NA 139 05/03/2014 1946   K 5.1 05/15/2014 0822   K 4.1 05/03/2014 1946   CL 103 05/03/2014 1946   CO2 25 05/15/2014 0822   CO2 26 05/03/2014 1946   BUN 9.2 05/15/2014 0822   BUN 17 05/03/2014 1946    CREATININE 0.8 05/15/2014 0822   CREATININE 0.77 05/03/2014 1946      Component Value Date/Time   CALCIUM 9.4 05/15/2014 0822   CALCIUM 9.6 05/03/2014 1946   ALKPHOS 83 05/15/2014 0822   ALKPHOS 76 09/14/2010 1708   AST 11 05/15/2014 0822   AST 18 09/14/2010 1708   ALT 13 05/15/2014 0822   ALT 21 09/14/2010 1708   BILITOT <0.20 05/15/2014 0822   BILITOT 0.4 09/14/2010 1708       Lab Results  Component Value Date   WBC 6.2 05/29/2014   HGB 11.3* 05/29/2014   HCT 34.3* 05/29/2014   MCV 93.2 05/29/2014   PLT 237 05/29/2014   NEUTROABS 4.1 05/29/2014   ASSESSMENT & PLAN:  Breast cancer of upper-outer quadrant of left female breast Left breast invasive ductal carcinoma grade 1; 4.8 cm with low-grade DCIS; margins negative 1/4 lymph nodes positive, ER 100% PR 100% HER-2 negative ratio 1.18 Ki-67 26% and 19% T2 N1 stage IIB  Treatment plan:  1. Systemic chemotherapy with dose dense Adriamycin and Cytoxan 4 followed by Abraxane weekly 12 2. Followed by adjuvant radiation  3. Followed by adjuvant antiestrogen therapy with anastrozole for 5 years  Current treatment: Cycle 1/12 Abraxane  Chemotherapy toxicities: 1. Profound fatigue for 2-3 days after chemotherapy starting day 4 2. Grade 4 neutropenia: decreased the dosage of cycle 2 chemotherapy of Adriamycin 50 mg/m and Cytoxan 500 mg/m 3. Thrombocytopenia grade 1 4. Ocular migraine after cycle 3 Adriamycin and Cytoxan 5. Profound leukocytosis white count 111,000 due to Neulasta  I reviewed her blood work from today and they're adequate for treatment.  Closely monitoring for chemotherapy toxicities. Return to clinic in 2 weeks for 3rd cycle of Abraxane.    No orders of the defined types were placed in this encounter.   The patient has a good understanding of the overall plan. she agrees with it. she will call with any problems that may develop before the next visit here.   Rulon Eisenmenger, MD

## 2014-05-29 NOTE — Telephone Encounter (Signed)
Appointments made and avs printed for patient °

## 2014-05-29 NOTE — Patient Instructions (Signed)
[facility] Discharge Instructions for Patients Receiving Chemotherapy  Today you received the following chemotherapy agents Abraxane  To help prevent nausea and vomiting after your treatment, we encourage you to take your nausea medication    If you develop nausea and vomiting that is not controlled by your nausea medication, call the clinic.   BELOW ARE SYMPTOMS THAT SHOULD BE REPORTED IMMEDIATELY:  *FEVER GREATER THAN 100.5 F  *CHILLS WITH OR WITHOUT FEVER  NAUSEA AND VOMITING THAT IS NOT CONTROLLED WITH YOUR NAUSEA MEDICATION  *UNUSUAL SHORTNESS OF BREATH  *UNUSUAL BRUISING OR BLEEDING  TENDERNESS IN MOUTH AND THROAT WITH OR WITHOUT PRESENCE OF ULCERS  *URINARY PROBLEMS  *BOWEL PROBLEMS  UNUSUAL RASH Items with * indicate a potential emergency and should be followed up as soon as possible.  Feel free to call the clinic you have any questions or concerns. The clinic phone number is [phone].  Please show the CHEMO ALERT CARD at check-in to the Emergency Department and triage nurse.   

## 2014-06-04 ENCOUNTER — Other Ambulatory Visit: Payer: Self-pay | Admitting: *Deleted

## 2014-06-04 DIAGNOSIS — C50412 Malignant neoplasm of upper-outer quadrant of left female breast: Secondary | ICD-10-CM

## 2014-06-04 NOTE — Assessment & Plan Note (Signed)
Left breast invasive ductal carcinoma grade 1; 4.8 cm with low-grade DCIS; margins negative 1/4 lymph nodes positive, ER 100% PR 100% HER-2 negative ratio 1.18 Ki-67 26% and 19% T2 N1 stage IIB  Treatment plan:  1. Systemic chemotherapy with dose dense Adriamycin and Cytoxan 4 followed by Abraxane weekly 12 2. Followed by adjuvant radiation 3. Followed by adjuvant antiestrogen therapy with anastrozole for 5 years  Current treatment: Cycle 2/12 Abraxane  Chemotherapy toxicities: 1. Profound fatigue for 2-3 days after chemotherapy starting day 4 2. Grade 4 neutropenia: decreased the dosage of cycle 2 chemotherapy of Adriamycin 50 mg/m and Cytoxan 500 mg/m 3. Thrombocytopenia grade 1 4. Ocular migraine after cycle 3 Adriamycin and Cytoxan 5. Profound leukocytosis white count 111,000 due to Neulasta  I reviewed her blood work from today and they're adequate for treatment.  Closely monitoring for chemotherapy toxicities. Return to clinic in 2 weeks for 4th cycle of Abraxane.

## 2014-06-05 ENCOUNTER — Ambulatory Visit (HOSPITAL_BASED_OUTPATIENT_CLINIC_OR_DEPARTMENT_OTHER): Payer: 59

## 2014-06-05 ENCOUNTER — Telehealth: Payer: Self-pay | Admitting: Hematology and Oncology

## 2014-06-05 ENCOUNTER — Other Ambulatory Visit (HOSPITAL_BASED_OUTPATIENT_CLINIC_OR_DEPARTMENT_OTHER): Payer: 59

## 2014-06-05 ENCOUNTER — Ambulatory Visit (HOSPITAL_BASED_OUTPATIENT_CLINIC_OR_DEPARTMENT_OTHER): Payer: 59 | Admitting: Hematology and Oncology

## 2014-06-05 ENCOUNTER — Encounter: Payer: Self-pay | Admitting: *Deleted

## 2014-06-05 VITALS — BP 124/70 | HR 92 | Temp 98.3°F | Resp 18 | Ht 65.0 in | Wt 156.9 lb

## 2014-06-05 DIAGNOSIS — D6481 Anemia due to antineoplastic chemotherapy: Secondary | ICD-10-CM

## 2014-06-05 DIAGNOSIS — C773 Secondary and unspecified malignant neoplasm of axilla and upper limb lymph nodes: Secondary | ICD-10-CM

## 2014-06-05 DIAGNOSIS — Z170 Estrogen receptor positive status [ER+]: Secondary | ICD-10-CM

## 2014-06-05 DIAGNOSIS — C50412 Malignant neoplasm of upper-outer quadrant of left female breast: Secondary | ICD-10-CM

## 2014-06-05 DIAGNOSIS — R530 Neoplastic (malignant) related fatigue: Secondary | ICD-10-CM

## 2014-06-05 DIAGNOSIS — D701 Agranulocytosis secondary to cancer chemotherapy: Secondary | ICD-10-CM

## 2014-06-05 DIAGNOSIS — D6959 Other secondary thrombocytopenia: Secondary | ICD-10-CM | POA: Diagnosis not present

## 2014-06-05 DIAGNOSIS — Z5111 Encounter for antineoplastic chemotherapy: Secondary | ICD-10-CM | POA: Diagnosis not present

## 2014-06-05 LAB — CBC WITH DIFFERENTIAL/PLATELET
BASO%: 0.8 % (ref 0.0–2.0)
Basophils Absolute: 0 10*3/uL (ref 0.0–0.1)
EOS%: 1.5 % (ref 0.0–7.0)
Eosinophils Absolute: 0.1 10*3/uL (ref 0.0–0.5)
HEMATOCRIT: 31.6 % — AB (ref 34.8–46.6)
HGB: 10.4 g/dL — ABNORMAL LOW (ref 11.6–15.9)
LYMPH#: 1 10*3/uL (ref 0.9–3.3)
LYMPH%: 25 % (ref 14.0–49.7)
MCH: 31.3 pg (ref 25.1–34.0)
MCHC: 32.9 g/dL (ref 31.5–36.0)
MCV: 95.2 fL (ref 79.5–101.0)
MONO#: 0.5 10*3/uL (ref 0.1–0.9)
MONO%: 11.5 % (ref 0.0–14.0)
NEUT#: 2.5 10*3/uL (ref 1.5–6.5)
NEUT%: 61.2 % (ref 38.4–76.8)
Platelets: 357 10*3/uL (ref 145–400)
RBC: 3.32 10*6/uL — AB (ref 3.70–5.45)
RDW: 17.3 % — ABNORMAL HIGH (ref 11.2–14.5)
WBC: 4 10*3/uL (ref 3.9–10.3)

## 2014-06-05 LAB — COMPREHENSIVE METABOLIC PANEL (CC13)
ALBUMIN: 3.6 g/dL (ref 3.5–5.0)
ALK PHOS: 64 U/L (ref 40–150)
ALT: 43 U/L (ref 0–55)
AST: 24 U/L (ref 5–34)
Anion Gap: 10 mEq/L (ref 3–11)
BUN: 10 mg/dL (ref 7.0–26.0)
CALCIUM: 9.5 mg/dL (ref 8.4–10.4)
CO2: 27 meq/L (ref 22–29)
Chloride: 106 mEq/L (ref 98–109)
Creatinine: 0.7 mg/dL (ref 0.6–1.1)
EGFR: 87 mL/min/{1.73_m2} — AB (ref >90)
GLUCOSE: 102 mg/dL (ref 70–140)
Potassium: 4.7 mEq/L (ref 3.5–5.1)
Sodium: 144 mEq/L (ref 136–145)
TOTAL PROTEIN: 6 g/dL — AB (ref 6.4–8.3)
Total Bilirubin: 0.37 mg/dL (ref 0.20–1.20)

## 2014-06-05 MED ORDER — SODIUM CHLORIDE 0.9 % IV SOLN
Freq: Once | INTRAVENOUS | Status: AC
  Administered 2014-06-05: 10:00:00 via INTRAVENOUS

## 2014-06-05 MED ORDER — SODIUM CHLORIDE 0.9 % IJ SOLN
10.0000 mL | INTRAMUSCULAR | Status: DC | PRN
  Administered 2014-06-05: 10 mL
  Filled 2014-06-05: qty 10

## 2014-06-05 MED ORDER — HEPARIN SOD (PORK) LOCK FLUSH 100 UNIT/ML IV SOLN
500.0000 [IU] | Freq: Once | INTRAVENOUS | Status: AC | PRN
  Administered 2014-06-05: 500 [IU]
  Filled 2014-06-05: qty 5

## 2014-06-05 MED ORDER — SODIUM CHLORIDE 0.9 % IV SOLN
Freq: Once | INTRAVENOUS | Status: AC
  Administered 2014-06-05: 10:00:00 via INTRAVENOUS
  Filled 2014-06-05: qty 4

## 2014-06-05 MED ORDER — PACLITAXEL PROTEIN-BOUND CHEMO INJECTION 100 MG
80.0000 mg/m2 | Freq: Once | INTRAVENOUS | Status: AC
  Administered 2014-06-05: 150 mg via INTRAVENOUS
  Filled 2014-06-05: qty 30

## 2014-06-05 MED ORDER — SODIUM CHLORIDE 0.9 % IJ SOLN
3.0000 mL | INTRAMUSCULAR | Status: DC | PRN
  Filled 2014-06-05: qty 10

## 2014-06-05 NOTE — Progress Notes (Signed)
Received ofice notes from [facility], sent to scan.

## 2014-06-05 NOTE — Patient Instructions (Signed)
[facility] Discharge Instructions for Patients Receiving Chemotherapy  Today you received the following chemotherapy agents Abraxane To help prevent nausea and vomiting after your treatment, we encourage you to take your nausea medication as prescribed.   If you develop nausea and vomiting that is not controlled by your nausea medication, call the clinic.   BELOW ARE SYMPTOMS THAT SHOULD BE REPORTED IMMEDIATELY:  *FEVER GREATER THAN 100.5 F  *CHILLS WITH OR WITHOUT FEVER  NAUSEA AND VOMITING THAT IS NOT CONTROLLED WITH YOUR NAUSEA MEDICATION  *UNUSUAL SHORTNESS OF BREATH  *UNUSUAL BRUISING OR BLEEDING  TENDERNESS IN MOUTH AND THROAT WITH OR WITHOUT PRESENCE OF ULCERS  *URINARY PROBLEMS  *BOWEL PROBLEMS  UNUSUAL RASH Items with * indicate a potential emergency and should be followed up as soon as possible.  Feel free to call the clinic you have any questions or concerns. The clinic phone number is [phone].  Please show the CHEMO ALERT CARD at check-in to the Emergency Department and triage nurse.   

## 2014-06-05 NOTE — Progress Notes (Signed)
Patient Care Team: Antony Contras, MD as PCP - General (Family Medicine) Elsie Stain, MD (Pulmonary Disease) Rolm Bookbinder, MD as Consulting Physician (General Surgery) Nicholas Lose, MD as Consulting Physician (Hematology and Oncology) [name], MD as Consulting Physician (Radiation Oncology) Rockwell Germany, RN as Registered Nurse Mauro Kaufmann, RN as Registered Nurse Holley Bouche, NP as Nurse Practitioner (Nurse Practitioner)  DIAGNOSIS: Breast cancer of upper-outer quadrant of left female breast   Staging form: Breast, AJCC 7th Edition     Clinical stage from 02/13/2014: Stage IIB (T3, N0, M0) - Unsigned       Staging comments: Staged at breast conference on 1.20.16      Pathologic stage from 03/11/2014: Stage IIB (T2, N1a, cM0) - Signed by Seward Grater, MD on 03/19/2014       Staging comments: Staged on final mastectomy specimen by Dr. Avis Epley    SUMMARY OF ONCOLOGIC HISTORY:   Breast cancer of upper-outer quadrant of left female breast   02/04/2014 Initial Diagnosis left breast: Invasive ductal carcinoma with DCIS ER 100%, PR 100%, HER-2 negative, Ki-67 ranged from 19-26%; third biopsy showed invasive mammary cancer with lobular features and intraductal papilloma, ER/PR positive HER-2 negative   02/11/2014 Breast MRI left breast: 3 masses that are confluent measuring 5.6 x 2.7 x 3 cm extends to the left aspect of area along the penis to involve skin of the area no lymph nodes detected right breast negative   03/07/2014 Surgery Left breast invasive ductal carcinoma grade 1; 4.8 cm with low-grade DCIS; margins negative 1/4 lymph nodes positive, ER 100% PR 100% HER-2 negative ratio 1.18 Ki-67 26% and 19% T2 N1 stage IIB   04/03/2014 -  Chemotherapy Adjuvant chemotherapy with dose dense Adriamycin and Cytoxan followed by weekly Abraxane 12    CHIEF COMPLIANT: Cycle 2/12 Abraxane  INTERVAL HISTORY: Theresa Huff is a 64 year old with above-mentioned history of left  breast cancer currently on adjuvant chemotherapy she is currently on weekly Abraxane's. She tolerated first week of Abraxane extremely well. She had plenty of energy good appetite good taste denies any problems with her bowels. Denies any fevers or chills.  REVIEW OF SYSTEMS:   Constitutional: Denies fevers, chills or abnormal weight loss Eyes: Denies blurriness of vision Ears, nose, mouth, throat, and face: Denies mucositis or sore throat Respiratory: Denies cough, dyspnea or wheezes Cardiovascular: Denies palpitation, chest discomfort or lower extremity swelling Gastrointestinal:  Denies nausea, heartburn or change in bowel habits Skin: Denies abnormal skin rashes Lymphatics: Denies new lymphadenopathy or easy bruising Neurological:Denies numbness, tingling or new weaknesses Behavioral/Psych: Mood is stable, no new changes  Breast:  denies any pain or lumps or nodules in either breasts All other systems were reviewed with the patient and are negative.  I have reviewed the past medical history, past surgical history, social history and family history with the patient and they are unchanged from previous note.  ALLERGIES:  is allergic to hydrocodone; codeine; iodine; primidone; sulfa antibiotics; and tape.  MEDICATIONS:  Current Outpatient Prescriptions  Medication Sig Dispense Refill  . calcium carbonate (OS-CAL) 600 MG TABS tablet Take 600 mg by mouth daily.    . cetirizine (ZYRTEC) 10 MG tablet Take 10 mg by mouth daily.      . Cholecalciferol (VITAMIN D PO) Take 2,000 Units by mouth daily.    . DULoxetine (CYMBALTA) 30 MG capsule Take 30 mg by mouth daily.     Marland Kitchen esomeprazole (NEXIUM) 20 MG packet Take 20 mg  by mouth daily before breakfast.    . Eszopiclone (ESZOPICLONE) 3 MG TABS Take 3 mg by mouth at bedtime. Take immediately before bedtime    . lidocaine-prilocaine (EMLA) cream     . mometasone (NASONEX) 50 MCG/ACT nasal spray Place 2 sprays into the nose daily.      .  ondansetron (ZOFRAN) 8 MG tablet   1  . prochlorperazine (COMPAZINE) 10 MG tablet   1  . SUMAtriptan (IMITREX) 50 MG tablet Take 1 tablet (50 mg total) by mouth every 2 (two) hours as needed for migraine (Max of 2 tabs per 24 hour period.). 10 tablet 0  . triamcinolone cream (KENALOG) 0.1 %     . Vitamin D, Ergocalciferol, (DRISDOL) 50000 UNITS CAPS capsule Take 50,000 Units by mouth every 7 (seven) days.    Marland Kitchen dexamethasone (DECADRON) 4 MG tablet   1  . LORazepam (ATIVAN) 0.5 MG tablet   0  . oxyCODONE (OXY IR/ROXICODONE) 5 MG immediate release tablet Take 1-2 tablets (5-10 mg total) by mouth every 4 (four) hours as needed for moderate pain. (Patient not taking: Reported on 06/05/2014) 30 tablet 0   No current facility-administered medications for this visit.    PHYSICAL EXAMINATION: ECOG PERFORMANCE STATUS: 0 - Asymptomatic  Filed Vitals:   06/05/14 0821  BP: 124/70  Pulse: 92  Temp: 98.3 F (36.8 C)  Resp: 18   Filed Weights   06/05/14 0821  Weight: 156 lb 14.4 oz (71.169 kg)    GENERAL:alert, no distress and comfortable SKIN: skin color, texture, turgor are normal, no rashes or significant lesions EYES: normal, Conjunctiva are pink and non-injected, sclera clear OROPHARYNX:no exudate, no erythema and lips, buccal mucosa, and tongue normal  NECK: supple, thyroid normal size, non-tender, without nodularity LYMPH:  no palpable lymphadenopathy in the cervical, axillary or inguinal LUNGS: clear to auscultation and percussion with normal breathing effort HEART: regular rate & rhythm and no murmurs and no lower extremity edema ABDOMEN:abdomen soft, non-tender and normal bowel sounds Musculoskeletal:no cyanosis of digits and no clubbing  NEURO: alert & oriented x 3 with fluent speech, no focal motor/sensory deficits   LABORATORY DATA:  I have reviewed the data as listed   Chemistry      Component Value Date/Time   NA 143 05/29/2014 0759   NA 139 05/03/2014 1946   K 5.0  05/29/2014 0759   K 4.1 05/03/2014 1946   CL 103 05/03/2014 1946   CO2 26 05/29/2014 0759   CO2 26 05/03/2014 1946   BUN 9.0 05/29/2014 0759   BUN 17 05/03/2014 1946   CREATININE 0.7 05/29/2014 0759   CREATININE 0.77 05/03/2014 1946      Component Value Date/Time   CALCIUM 9.5 05/29/2014 0759   CALCIUM 9.6 05/03/2014 1946   ALKPHOS 84 05/29/2014 0759   ALKPHOS 76 09/14/2010 1708   AST 10 05/29/2014 0759   AST 18 09/14/2010 1708   ALT 12 05/29/2014 0759   ALT 21 09/14/2010 1708   BILITOT 0.26 05/29/2014 0759   BILITOT 0.4 09/14/2010 1708       Lab Results  Component Value Date   WBC 4.0 06/05/2014   HGB 10.4* 06/05/2014   HCT 31.6* 06/05/2014   MCV 95.2 06/05/2014   PLT 357 06/05/2014   NEUTROABS 2.5 06/05/2014   ASSESSMENT & PLAN:  Breast cancer of upper-outer quadrant of left female breast Left breast invasive ductal carcinoma grade 1; 4.8 cm with low-grade DCIS; margins negative 1/4 lymph nodes positive, ER 100%  PR 100% HER-2 negative ratio 1.18 Ki-67 26% and 19% T2 N1 stage IIB  Treatment plan:  1. Systemic chemotherapy with dose dense Adriamycin and Cytoxan 4 followed by Abraxane weekly 12 2. Followed by adjuvant radiation 3. Followed by adjuvant antiestrogen therapy with anastrozole for 5 years  Current treatment: Cycle 2/12 Abraxane  Chemotherapy toxicities: 1. Profound fatigue for 2-3 days after chemotherapy starting day 4 2. Grade 4 neutropenia: decreased the dosage of cycle 2 chemotherapy of Adriamycin 50 mg/m and Cytoxan 500 mg/m 3. Thrombocytopenia grade 1 4. Ocular migraine after cycle 3 Adriamycin and Cytoxan 5. Profound leukocytosis white count 111,000 due to Neulasta (resolved) 6. Chemotherapy-induced anemia: Hemoglobin 10.4. We will continue to watch and monitor.  I reviewed her blood work from today and they're adequate for treatment.  Closely monitoring for chemotherapy toxicities. Return to clinic in 2 weeks for 4th cycle of  Abraxane.  No orders of the defined types were placed in this encounter.   The patient has a good understanding of the overall plan. she agrees with it. she will call with any problems that may develop before the next visit here.   Rulon Eisenmenger, MD

## 2014-06-05 NOTE — Telephone Encounter (Signed)
Appointments made and avs printed for patient °

## 2014-06-11 ENCOUNTER — Other Ambulatory Visit: Payer: Self-pay

## 2014-06-11 DIAGNOSIS — C50412 Malignant neoplasm of upper-outer quadrant of left female breast: Secondary | ICD-10-CM

## 2014-06-12 ENCOUNTER — Other Ambulatory Visit (HOSPITAL_BASED_OUTPATIENT_CLINIC_OR_DEPARTMENT_OTHER): Payer: 59

## 2014-06-12 ENCOUNTER — Ambulatory Visit: Payer: 59

## 2014-06-12 ENCOUNTER — Ambulatory Visit (HOSPITAL_BASED_OUTPATIENT_CLINIC_OR_DEPARTMENT_OTHER): Payer: 59

## 2014-06-12 ENCOUNTER — Ambulatory Visit: Payer: 59 | Admitting: Hematology and Oncology

## 2014-06-12 ENCOUNTER — Other Ambulatory Visit: Payer: 59

## 2014-06-12 VITALS — BP 110/74 | HR 93 | Temp 98.2°F

## 2014-06-12 DIAGNOSIS — C50412 Malignant neoplasm of upper-outer quadrant of left female breast: Secondary | ICD-10-CM

## 2014-06-12 DIAGNOSIS — C773 Secondary and unspecified malignant neoplasm of axilla and upper limb lymph nodes: Secondary | ICD-10-CM | POA: Diagnosis not present

## 2014-06-12 DIAGNOSIS — Z5111 Encounter for antineoplastic chemotherapy: Secondary | ICD-10-CM

## 2014-06-12 LAB — COMPREHENSIVE METABOLIC PANEL (CC13)
ALT: 58 U/L — AB (ref 0–55)
AST: 29 U/L (ref 5–34)
Albumin: 3.7 g/dL (ref 3.5–5.0)
Alkaline Phosphatase: 55 U/L (ref 40–150)
Anion Gap: 11 mEq/L (ref 3–11)
BUN: 11.4 mg/dL (ref 7.0–26.0)
CALCIUM: 9.3 mg/dL (ref 8.4–10.4)
CHLORIDE: 107 meq/L (ref 98–109)
CO2: 26 mEq/L (ref 22–29)
Creatinine: 0.7 mg/dL (ref 0.6–1.1)
EGFR: >90 mL/min/{1.73_m2} (ref >90)
Glucose: 65 mg/dl — ABNORMAL LOW (ref 70–140)
Potassium: 4.4 mEq/L (ref 3.5–5.1)
Sodium: 143 mEq/L (ref 136–145)
Total Bilirubin: 0.41 mg/dL (ref 0.20–1.20)
Total Protein: 6 g/dL — ABNORMAL LOW (ref 6.4–8.3)

## 2014-06-12 LAB — CBC WITH DIFFERENTIAL/PLATELET
BASO%: 0.9 % (ref 0.0–2.0)
Basophils Absolute: 0 10*3/uL (ref 0.0–0.1)
EOS%: 3.6 % (ref 0.0–7.0)
Eosinophils Absolute: 0.1 10*3/uL (ref 0.0–0.5)
HCT: 32 % — ABNORMAL LOW (ref 34.8–46.6)
HGB: 10.5 g/dL — ABNORMAL LOW (ref 11.6–15.9)
LYMPH%: 31.8 % (ref 14.0–49.7)
MCH: 31.6 pg (ref 25.1–34.0)
MCHC: 32.8 g/dL (ref 31.5–36.0)
MCV: 96.4 fL (ref 79.5–101.0)
MONO#: 0.5 10*3/uL (ref 0.1–0.9)
MONO%: 13.9 % (ref 0.0–14.0)
NEUT#: 1.7 10*3/uL (ref 1.5–6.5)
NEUT%: 49.8 % (ref 38.4–76.8)
PLATELETS: 311 10*3/uL (ref 145–400)
RBC: 3.32 10*6/uL — ABNORMAL LOW (ref 3.70–5.45)
RDW: 17.4 % — AB (ref 11.2–14.5)
WBC: 3.4 10*3/uL — ABNORMAL LOW (ref 3.9–10.3)
lymph#: 1.1 10*3/uL (ref 0.9–3.3)

## 2014-06-12 MED ORDER — SODIUM CHLORIDE 0.9 % IJ SOLN
10.0000 mL | INTRAMUSCULAR | Status: DC | PRN
  Administered 2014-06-12: 10 mL
  Filled 2014-06-12: qty 10

## 2014-06-12 MED ORDER — HEPARIN SOD (PORK) LOCK FLUSH 100 UNIT/ML IV SOLN
500.0000 [IU] | Freq: Once | INTRAVENOUS | Status: AC | PRN
  Administered 2014-06-12: 500 [IU]
  Filled 2014-06-12: qty 5

## 2014-06-12 MED ORDER — SODIUM CHLORIDE 0.9 % IV SOLN
Freq: Once | INTRAVENOUS | Status: AC
  Administered 2014-06-12: 09:00:00 via INTRAVENOUS

## 2014-06-12 MED ORDER — PACLITAXEL PROTEIN-BOUND CHEMO INJECTION 100 MG
80.0000 mg/m2 | Freq: Once | INTRAVENOUS | Status: AC
  Administered 2014-06-12: 150 mg via INTRAVENOUS
  Filled 2014-06-12: qty 30

## 2014-06-12 MED ORDER — SODIUM CHLORIDE 0.9 % IV SOLN
Freq: Once | INTRAVENOUS | Status: AC
  Administered 2014-06-12: 09:00:00 via INTRAVENOUS
  Filled 2014-06-12: qty 4

## 2014-06-12 NOTE — Patient Instructions (Signed)
[facility] Discharge Instructions for Patients Receiving Chemotherapy  Today you received the following chemotherapy agents Abraxane  To help prevent nausea and vomiting after your treatment, we encourage you to take your nausea medication    If you develop nausea and vomiting that is not controlled by your nausea medication, call the clinic.   BELOW ARE SYMPTOMS THAT SHOULD BE REPORTED IMMEDIATELY:  *FEVER GREATER THAN 100.5 F  *CHILLS WITH OR WITHOUT FEVER  NAUSEA AND VOMITING THAT IS NOT CONTROLLED WITH YOUR NAUSEA MEDICATION  *UNUSUAL SHORTNESS OF BREATH  *UNUSUAL BRUISING OR BLEEDING  TENDERNESS IN MOUTH AND THROAT WITH OR WITHOUT PRESENCE OF ULCERS  *URINARY PROBLEMS  *BOWEL PROBLEMS  UNUSUAL RASH Items with * indicate a potential emergency and should be followed up as soon as possible.  Feel free to call the clinic you have any questions or concerns. The clinic phone number is [phone].  Please show the CHEMO ALERT CARD at check-in to the Emergency Department and triage nurse.   

## 2014-06-19 ENCOUNTER — Telehealth: Payer: Self-pay | Admitting: Hematology and Oncology

## 2014-06-19 ENCOUNTER — Ambulatory Visit (HOSPITAL_BASED_OUTPATIENT_CLINIC_OR_DEPARTMENT_OTHER): Payer: 59

## 2014-06-19 ENCOUNTER — Ambulatory Visit: Payer: 59

## 2014-06-19 ENCOUNTER — Ambulatory Visit (HOSPITAL_BASED_OUTPATIENT_CLINIC_OR_DEPARTMENT_OTHER): Payer: 59 | Admitting: Hematology and Oncology

## 2014-06-19 ENCOUNTER — Other Ambulatory Visit (HOSPITAL_BASED_OUTPATIENT_CLINIC_OR_DEPARTMENT_OTHER): Payer: 59

## 2014-06-19 VITALS — BP 111/70 | HR 94 | Temp 98.1°F | Resp 18 | Ht 65.0 in | Wt 159.1 lb

## 2014-06-19 DIAGNOSIS — C50412 Malignant neoplasm of upper-outer quadrant of left female breast: Secondary | ICD-10-CM

## 2014-06-19 DIAGNOSIS — D701 Agranulocytosis secondary to cancer chemotherapy: Secondary | ICD-10-CM | POA: Diagnosis not present

## 2014-06-19 DIAGNOSIS — C773 Secondary and unspecified malignant neoplasm of axilla and upper limb lymph nodes: Secondary | ICD-10-CM

## 2014-06-19 DIAGNOSIS — D696 Thrombocytopenia, unspecified: Secondary | ICD-10-CM | POA: Diagnosis not present

## 2014-06-19 DIAGNOSIS — R5383 Other fatigue: Secondary | ICD-10-CM

## 2014-06-19 DIAGNOSIS — Z5111 Encounter for antineoplastic chemotherapy: Secondary | ICD-10-CM

## 2014-06-19 DIAGNOSIS — D72829 Elevated white blood cell count, unspecified: Secondary | ICD-10-CM

## 2014-06-19 LAB — COMPREHENSIVE METABOLIC PANEL (CC13)
ALBUMIN: 3.8 g/dL (ref 3.5–5.0)
ALT: 63 U/L — ABNORMAL HIGH (ref 0–55)
ANION GAP: 12 meq/L — AB (ref 3–11)
AST: 39 U/L — AB (ref 5–34)
Alkaline Phosphatase: 61 U/L (ref 40–150)
BILIRUBIN TOTAL: 0.46 mg/dL (ref 0.20–1.20)
BUN: 7.8 mg/dL (ref 7.0–26.0)
CHLORIDE: 107 meq/L (ref 98–109)
CO2: 25 mEq/L (ref 22–29)
Calcium: 9.3 mg/dL (ref 8.4–10.4)
Creatinine: 0.7 mg/dL (ref 0.6–1.1)
EGFR: 87 mL/min/{1.73_m2} — ABNORMAL LOW (ref >90)
Glucose: 67 mg/dl — ABNORMAL LOW (ref 70–140)
Potassium: 4.4 mEq/L (ref 3.5–5.1)
Sodium: 143 mEq/L (ref 136–145)
Total Protein: 6.3 g/dL — ABNORMAL LOW (ref 6.4–8.3)

## 2014-06-19 LAB — CBC WITH DIFFERENTIAL/PLATELET
BASO%: 1.9 % (ref 0.0–2.0)
Basophils Absolute: 0.1 10*3/uL (ref 0.0–0.1)
EOS ABS: 0.2 10*3/uL (ref 0.0–0.5)
EOS%: 5.1 % (ref 0.0–7.0)
HCT: 32.4 % — ABNORMAL LOW (ref 34.8–46.6)
HGB: 11.1 g/dL — ABNORMAL LOW (ref 11.6–15.9)
LYMPH%: 30.6 % (ref 14.0–49.7)
MCH: 32.1 pg (ref 25.1–34.0)
MCHC: 34.1 g/dL (ref 31.5–36.0)
MCV: 94 fL (ref 79.5–101.0)
MONO#: 0.5 10*3/uL (ref 0.1–0.9)
MONO%: 14.1 % — ABNORMAL HIGH (ref 0.0–14.0)
NEUT#: 1.6 10*3/uL (ref 1.5–6.5)
NEUT%: 48.3 % (ref 38.4–76.8)
PLATELETS: 329 10*3/uL (ref 145–400)
RBC: 3.45 10*6/uL — ABNORMAL LOW (ref 3.70–5.45)
RDW: 17.9 % — ABNORMAL HIGH (ref 11.2–14.5)
WBC: 3.2 10*3/uL — AB (ref 3.9–10.3)
lymph#: 1 10*3/uL (ref 0.9–3.3)

## 2014-06-19 MED ORDER — PACLITAXEL PROTEIN-BOUND CHEMO INJECTION 100 MG
80.0000 mg/m2 | Freq: Once | INTRAVENOUS | Status: AC
  Administered 2014-06-19: 150 mg via INTRAVENOUS
  Filled 2014-06-19: qty 30

## 2014-06-19 MED ORDER — HEPARIN SOD (PORK) LOCK FLUSH 100 UNIT/ML IV SOLN
500.0000 [IU] | Freq: Once | INTRAVENOUS | Status: AC | PRN
  Administered 2014-06-19: 500 [IU]
  Filled 2014-06-19: qty 5

## 2014-06-19 MED ORDER — SODIUM CHLORIDE 0.9 % IV SOLN
Freq: Once | INTRAVENOUS | Status: AC
  Administered 2014-06-19: 10:00:00 via INTRAVENOUS
  Filled 2014-06-19: qty 4

## 2014-06-19 MED ORDER — SODIUM CHLORIDE 0.9 % IJ SOLN
10.0000 mL | INTRAMUSCULAR | Status: DC | PRN
  Administered 2014-06-19: 10 mL
  Filled 2014-06-19: qty 10

## 2014-06-19 MED ORDER — SODIUM CHLORIDE 0.9 % IV SOLN
Freq: Once | INTRAVENOUS | Status: AC
  Administered 2014-06-19: 09:00:00 via INTRAVENOUS

## 2014-06-19 NOTE — Telephone Encounter (Signed)
Appointments made and avs printed for patient °

## 2014-06-19 NOTE — Patient Instructions (Signed)
[facility] Discharge Instructions for Patients Receiving Chemotherapy  Today you received the following chemotherapy agents: Abraxane. To help prevent nausea and vomiting after your treatment, we encourage you to take your nausea medication:Compazine 10 mg every 6 hours and Zofran 8 mg as needed.   If you develop nausea and vomiting that is not controlled by your nausea medication, call the clinic.   BELOW ARE SYMPTOMS THAT SHOULD BE REPORTED IMMEDIATELY:  *FEVER GREATER THAN 100.5 F  *CHILLS WITH OR WITHOUT FEVER  NAUSEA AND VOMITING THAT IS NOT CONTROLLED WITH YOUR NAUSEA MEDICATION  *UNUSUAL SHORTNESS OF BREATH  *UNUSUAL BRUISING OR BLEEDING  TENDERNESS IN MOUTH AND THROAT WITH OR WITHOUT PRESENCE OF ULCERS  *URINARY PROBLEMS  *BOWEL PROBLEMS  UNUSUAL RASH Items with * indicate a potential emergency and should be followed up as soon as possible.  Feel free to call the clinic you have any questions or concerns. The clinic phone number is [phone].  Please show the [place] at check-in to the Emergency Department and triage nurse.

## 2014-06-19 NOTE — Progress Notes (Signed)
Patient Care Team: Antony Contras, MD as PCP - General (Family Medicine) Elsie Stain, MD (Pulmonary Disease) Rolm Bookbinder, MD as Consulting Physician (General Surgery) Nicholas Lose, MD as Consulting Physician (Hematology and Oncology) [name], MD as Consulting Physician (Radiation Oncology) Rockwell Germany, RN as Registered Nurse Mauro Kaufmann, RN as Registered Nurse Holley Bouche, NP as Nurse Practitioner (Nurse Practitioner)  DIAGNOSIS: Breast cancer of upper-outer quadrant of left female breast   Staging form: Breast, AJCC 7th Edition     Clinical stage from 02/13/2014: Stage IIB (T3, N0, M0) - Unsigned       Staging comments: Staged at breast conference on 1.20.16      Pathologic stage from 03/11/2014: Stage IIB (T2, N1a, cM0) - Signed by Seward Grater, MD on 03/19/2014       Staging comments: Staged on final mastectomy specimen by Dr. Avis Epley    SUMMARY OF ONCOLOGIC HISTORY:   Breast cancer of upper-outer quadrant of left female breast   02/04/2014 Initial Diagnosis left breast: Invasive ductal carcinoma with DCIS ER 100%, PR 100%, HER-2 negative, Ki-67 ranged from 19-26%; third biopsy showed invasive mammary cancer with lobular features and intraductal papilloma, ER/PR positive HER-2 negative   02/11/2014 Breast MRI left breast: 3 masses that are confluent measuring 5.6 x 2.7 x 3 cm extends to the left aspect of area along the penis to involve skin of the area no lymph nodes detected right breast negative   03/07/2014 Surgery Left breast invasive ductal carcinoma grade 1; 4.8 cm with low-grade DCIS; margins negative 1/4 lymph nodes positive, ER 100% PR 100% HER-2 negative ratio 1.18 Ki-67 26% and 19% T2 N1 stage IIB   04/03/2014 -  Chemotherapy Adjuvant chemotherapy with dose dense Adriamycin and Cytoxan followed by weekly Abraxane 12    CHIEF COMPLIANT: Abraxane week 4/12  INTERVAL HISTORY: Theresa Huff is a 64 year old with above-mentioned history of left-sided  breast cancer currently on adjuvant chemotherapy. She is receiving weekly Abraxane. She is tolerating Abraxane extremely well. She was mowing her lawn on an incline and felt short of breath to exertion. She does not have any shortness of breath at rest or with minimal activity. Denies any chest pain cough expectoration or fevers or chills. Her husband will be living at [employer] of the next 2 years. She is looking forward to going to [place] after completion of radiation therapy.  REVIEW OF SYSTEMS:   Constitutional: Denies fevers, chills or abnormal weight loss Eyes: Denies blurriness of vision Ears, nose, mouth, throat, and face: Denies mucositis or sore throat Respiratory: Denies cough, dyspnea or wheezes Cardiovascular: Denies palpitation, chest discomfort or lower extremity swelling Gastrointestinal:  Denies nausea, heartburn or change in bowel habits Skin: Denies abnormal skin rashes Lymphatics: Denies new lymphadenopathy or easy bruising Neurological:Denies numbness, tingling or new weaknesses Behavioral/Psych: Mood is stable, no new changes  Breast:  denies any pain or lumps or nodules in either breasts All other systems were reviewed with the patient and are negative.  I have reviewed the past medical history, past surgical history, social history and family history with the patient and they are unchanged from previous note.  ALLERGIES:  is allergic to hydrocodone; codeine; iodine; primidone; sulfa antibiotics; and tape.  MEDICATIONS:  Current Outpatient Prescriptions  Medication Sig Dispense Refill  . calcium carbonate (OS-CAL) 600 MG TABS tablet Take 600 mg by mouth daily.    . cetirizine (ZYRTEC) 10 MG tablet Take 10 mg by mouth daily.      Marland Kitchen  Cholecalciferol (VITAMIN D PO) Take 2,000 Units by mouth daily.    Marland Kitchen dexamethasone (DECADRON) 4 MG tablet   1  . DULoxetine (CYMBALTA) 30 MG capsule Take 30 mg by mouth daily.     Marland Kitchen esomeprazole (NEXIUM) 20 MG packet Take 20 mg by  mouth daily before breakfast.    . Eszopiclone (ESZOPICLONE) 3 MG TABS Take 3 mg by mouth at bedtime. Take immediately before bedtime    . lidocaine-prilocaine (EMLA) cream     . LORazepam (ATIVAN) 0.5 MG tablet   0  . mometasone (NASONEX) 50 MCG/ACT nasal spray Place 2 sprays into the nose daily.      . ondansetron (ZOFRAN) 8 MG tablet   1  . oxyCODONE (OXY IR/ROXICODONE) 5 MG immediate release tablet Take 1-2 tablets (5-10 mg total) by mouth every 4 (four) hours as needed for moderate pain. 30 tablet 0  . prochlorperazine (COMPAZINE) 10 MG tablet   1  . SUMAtriptan (IMITREX) 50 MG tablet Take 1 tablet (50 mg total) by mouth every 2 (two) hours as needed for migraine (Max of 2 tabs per 24 hour period.). 10 tablet 0  . triamcinolone cream (KENALOG) 0.1 %     . Vitamin D, Ergocalciferol, (DRISDOL) 50000 UNITS CAPS capsule Take 50,000 Units by mouth every 7 (seven) days.     No current facility-administered medications for this visit.    PHYSICAL EXAMINATION: ECOG PERFORMANCE STATUS: 1 - Symptomatic but completely ambulatory  Filed Vitals:   06/19/14 0817  BP: 111/70  Pulse: 94  Temp: 98.1 F (36.7 C)  Resp: 18   Filed Weights   06/19/14 0817  Weight: 159 lb 1.6 oz (72.167 kg)    GENERAL:alert, no distress and comfortable SKIN: skin color, texture, turgor are normal, no rashes or significant lesions EYES: normal, Conjunctiva are pink and non-injected, sclera clear OROPHARYNX:no exudate, no erythema and lips, buccal mucosa, and tongue normal  NECK: supple, thyroid normal size, non-tender, without nodularity LYMPH:  no palpable lymphadenopathy in the cervical, axillary or inguinal LUNGS: clear to auscultation and percussion with normal breathing effort HEART: regular rate & rhythm and no murmurs and no lower extremity edema ABDOMEN:abdomen soft, non-tender and normal bowel sounds Musculoskeletal:no cyanosis of digits and no clubbing  NEURO: alert & oriented x 3 with fluent  speech, no focal motor/sensory deficits  LABORATORY DATA:  I have reviewed the data as listed   Chemistry      Component Value Date/Time   NA 143 06/12/2014 0817   NA 139 05/03/2014 1946   K 4.4 06/12/2014 0817   K 4.1 05/03/2014 1946   CL 103 05/03/2014 1946   CO2 26 06/12/2014 0817   CO2 26 05/03/2014 1946   BUN 11.4 06/12/2014 0817   BUN 17 05/03/2014 1946   CREATININE 0.7 06/12/2014 0817   CREATININE 0.77 05/03/2014 1946      Component Value Date/Time   CALCIUM 9.3 06/12/2014 0817   CALCIUM 9.6 05/03/2014 1946   ALKPHOS 55 06/12/2014 0817   ALKPHOS 76 09/14/2010 1708   AST 29 06/12/2014 0817   AST 18 09/14/2010 1708   ALT 58* 06/12/2014 0817   ALT 21 09/14/2010 1708   BILITOT 0.41 06/12/2014 0817   BILITOT 0.4 09/14/2010 1708       Lab Results  Component Value Date   WBC 3.2* 06/19/2014   HGB 11.1* 06/19/2014   HCT 32.4* 06/19/2014   MCV 94.0 06/19/2014   PLT 329 06/19/2014   NEUTROABS 1.6 06/19/2014  ASSESSMENT & PLAN:  Breast cancer of upper-outer quadrant of left female breast Left breast invasive ductal carcinoma grade 1; 4.8 cm with low-grade DCIS; margins negative 1/4 lymph nodes positive, ER 100% PR 100% HER-2 negative ratio 1.18 Ki-67 26% and 19% T2 N1 stage IIB  Treatment plan:  1. Systemic chemotherapy with dose dense Adriamycin and Cytoxan 4 followed by Abraxane weekly 12 2. Followed by adjuvant radiation 3. Followed by adjuvant antiestrogen therapy with anastrozole for 5 years  Current treatment: Cycle 4/12 Abraxane  Chemotherapy toxicities: 1. Profound fatigue for 2-3 days after chemotherapy starting day 4 2. Grade 4 neutropenia: decreased the dosage of cycle 2 chemotherapy of Adriamycin 50 mg/m and Cytoxan 500 mg/m 3. Thrombocytopenia grade 1 4. Ocular migraine after cycle 3 Adriamycin and Cytoxan 5. Profound leukocytosis white count 111,000 due to Neulasta  I reviewed her blood work from today and they're adequate for  treatment.  Closely monitoring for chemotherapy toxicities. Return to clinic in 2 weeks for 6th cycle of Abraxane.  No orders of the defined types were placed in this encounter.   The patient has a good understanding of the overall plan. she agrees with it. she will call with any problems that may develop before the next visit here.   Rulon Eisenmenger, MD     Ms.

## 2014-06-19 NOTE — Assessment & Plan Note (Addendum)
Left breast invasive ductal carcinoma grade 1; 4.8 cm with low-grade DCIS; margins negative 1/4 lymph nodes positive, ER 100% PR 100% HER-2 negative ratio 1.18 Ki-67 26% and 19% T2 N1 stage IIB  Treatment plan:  1. Systemic chemotherapy with dose dense Adriamycin and Cytoxan 4 followed by Abraxane weekly 12 2. Followed by adjuvant radiation 3. Followed by adjuvant antiestrogen therapy with anastrozole for 5 years  Current treatment: Cycle 4/12 Abraxane  Chemotherapy toxicities: 1. Profound fatigue for 2-3 days after chemotherapy starting day 4 2. Grade 4 neutropenia: decreased the dosage of cycle 2 chemotherapy of Adriamycin 50 mg/m and Cytoxan 500 mg/m 3. Thrombocytopenia grade 1 4. Ocular migraine after cycle 3 Adriamycin and Cytoxan 5. Profound leukocytosis white count 111,000 due to Neulasta  I reviewed her blood work from today and they're adequate for treatment.  Closely monitoring for chemotherapy toxicities. Return to clinic in 2 weeks for 6th cycle of Abraxane.

## 2014-06-26 ENCOUNTER — Ambulatory Visit (HOSPITAL_BASED_OUTPATIENT_CLINIC_OR_DEPARTMENT_OTHER): Payer: 59

## 2014-06-26 ENCOUNTER — Other Ambulatory Visit (HOSPITAL_BASED_OUTPATIENT_CLINIC_OR_DEPARTMENT_OTHER): Payer: 59

## 2014-06-26 VITALS — BP 122/72 | HR 85 | Temp 98.7°F | Resp 16

## 2014-06-26 DIAGNOSIS — C50412 Malignant neoplasm of upper-outer quadrant of left female breast: Secondary | ICD-10-CM

## 2014-06-26 DIAGNOSIS — C773 Secondary and unspecified malignant neoplasm of axilla and upper limb lymph nodes: Secondary | ICD-10-CM | POA: Diagnosis not present

## 2014-06-26 DIAGNOSIS — Z5111 Encounter for antineoplastic chemotherapy: Secondary | ICD-10-CM | POA: Diagnosis not present

## 2014-06-26 LAB — COMPREHENSIVE METABOLIC PANEL (CC13)
ALT: 55 U/L (ref 0–55)
AST: 40 U/L — ABNORMAL HIGH (ref 5–34)
Albumin: 3.7 g/dL (ref 3.5–5.0)
Alkaline Phosphatase: 54 U/L (ref 40–150)
Anion Gap: 11 mEq/L (ref 3–11)
BUN: 11.1 mg/dL (ref 7.0–26.0)
CO2: 26 meq/L (ref 22–29)
CREATININE: 0.7 mg/dL (ref 0.6–1.1)
Calcium: 9.1 mg/dL (ref 8.4–10.4)
Chloride: 107 mEq/L (ref 98–109)
EGFR: >90 mL/min/{1.73_m2} (ref >90)
GLUCOSE: 79 mg/dL (ref 70–140)
POTASSIUM: 4.5 meq/L (ref 3.5–5.1)
Sodium: 144 mEq/L (ref 136–145)
Total Bilirubin: 0.48 mg/dL (ref 0.20–1.20)
Total Protein: 6.2 g/dL — ABNORMAL LOW (ref 6.4–8.3)

## 2014-06-26 LAB — CBC WITH DIFFERENTIAL/PLATELET
BASO%: 1.1 % (ref 0.0–2.0)
Basophils Absolute: 0 10*3/uL (ref 0.0–0.1)
EOS%: 4.5 % (ref 0.0–7.0)
Eosinophils Absolute: 0.2 10*3/uL (ref 0.0–0.5)
HCT: 33.5 % — ABNORMAL LOW (ref 34.8–46.6)
HGB: 10.8 g/dL — ABNORMAL LOW (ref 11.6–15.9)
LYMPH#: 1.1 10*3/uL (ref 0.9–3.3)
LYMPH%: 31.7 % (ref 14.0–49.7)
MCH: 31.3 pg (ref 25.1–34.0)
MCHC: 32.2 g/dL (ref 31.5–36.0)
MCV: 97.1 fL (ref 79.5–101.0)
MONO#: 0.3 10*3/uL (ref 0.1–0.9)
MONO%: 9 % (ref 0.0–14.0)
NEUT%: 53.7 % (ref 38.4–76.8)
NEUTROS ABS: 1.9 10*3/uL (ref 1.5–6.5)
PLATELETS: 291 10*3/uL (ref 145–400)
RBC: 3.45 10*6/uL — AB (ref 3.70–5.45)
RDW: 16 % — ABNORMAL HIGH (ref 11.2–14.5)
WBC: 3.6 10*3/uL — ABNORMAL LOW (ref 3.9–10.3)

## 2014-06-26 MED ORDER — HEPARIN SOD (PORK) LOCK FLUSH 100 UNIT/ML IV SOLN
500.0000 [IU] | Freq: Once | INTRAVENOUS | Status: AC | PRN
  Administered 2014-06-26: 500 [IU]
  Filled 2014-06-26: qty 5

## 2014-06-26 MED ORDER — SODIUM CHLORIDE 0.9 % IV SOLN
Freq: Once | INTRAVENOUS | Status: AC
  Administered 2014-06-26: 09:00:00 via INTRAVENOUS

## 2014-06-26 MED ORDER — SODIUM CHLORIDE 0.9 % IJ SOLN
10.0000 mL | INTRAMUSCULAR | Status: DC | PRN
  Administered 2014-06-26: 10 mL
  Filled 2014-06-26: qty 10

## 2014-06-26 MED ORDER — ONDANSETRON HCL 40 MG/20ML IJ SOLN
Freq: Once | INTRAMUSCULAR | Status: DC
Start: 2014-06-26 — End: 2014-06-26
  Filled 2014-06-26: qty 4

## 2014-06-26 MED ORDER — PACLITAXEL PROTEIN-BOUND CHEMO INJECTION 100 MG
80.0000 mg/m2 | Freq: Once | INTRAVENOUS | Status: AC
  Administered 2014-06-26: 150 mg via INTRAVENOUS
  Filled 2014-06-26: qty 30

## 2014-06-26 NOTE — Patient Instructions (Signed)
[facility] Discharge Instructions for Patients Receiving Chemotherapy  Today you received the following chemotherapy agents Abraxane  To help prevent nausea and vomiting after your treatment, we encourage you to take your nausea medication    If you develop nausea and vomiting that is not controlled by your nausea medication, call the clinic.   BELOW ARE SYMPTOMS THAT SHOULD BE REPORTED IMMEDIATELY:  *FEVER GREATER THAN 100.5 F  *CHILLS WITH OR WITHOUT FEVER  NAUSEA AND VOMITING THAT IS NOT CONTROLLED WITH YOUR NAUSEA MEDICATION  *UNUSUAL SHORTNESS OF BREATH  *UNUSUAL BRUISING OR BLEEDING  TENDERNESS IN MOUTH AND THROAT WITH OR WITHOUT PRESENCE OF ULCERS  *URINARY PROBLEMS  *BOWEL PROBLEMS  UNUSUAL RASH Items with * indicate a potential emergency and should be followed up as soon as possible.  Feel free to call the clinic you have any questions or concerns. The clinic phone number is [phone].  Please show the CHEMO ALERT CARD at check-in to the Emergency Department and triage nurse.   

## 2014-07-03 ENCOUNTER — Ambulatory Visit: Payer: 59

## 2014-07-03 ENCOUNTER — Other Ambulatory Visit: Payer: 59

## 2014-07-03 ENCOUNTER — Ambulatory Visit (HOSPITAL_BASED_OUTPATIENT_CLINIC_OR_DEPARTMENT_OTHER): Payer: 59

## 2014-07-03 ENCOUNTER — Ambulatory Visit (HOSPITAL_BASED_OUTPATIENT_CLINIC_OR_DEPARTMENT_OTHER): Payer: 59 | Admitting: Nurse Practitioner

## 2014-07-03 ENCOUNTER — Encounter: Payer: Self-pay | Admitting: Nurse Practitioner

## 2014-07-03 ENCOUNTER — Other Ambulatory Visit (HOSPITAL_BASED_OUTPATIENT_CLINIC_OR_DEPARTMENT_OTHER): Payer: 59

## 2014-07-03 VITALS — BP 132/78 | HR 66 | Temp 98.1°F | Resp 18 | Ht 65.0 in | Wt 159.4 lb

## 2014-07-03 DIAGNOSIS — C50412 Malignant neoplasm of upper-outer quadrant of left female breast: Secondary | ICD-10-CM

## 2014-07-03 DIAGNOSIS — D72829 Elevated white blood cell count, unspecified: Secondary | ICD-10-CM

## 2014-07-03 DIAGNOSIS — C773 Secondary and unspecified malignant neoplasm of axilla and upper limb lymph nodes: Secondary | ICD-10-CM

## 2014-07-03 DIAGNOSIS — J3489 Other specified disorders of nose and nasal sinuses: Secondary | ICD-10-CM

## 2014-07-03 DIAGNOSIS — Z5111 Encounter for antineoplastic chemotherapy: Secondary | ICD-10-CM | POA: Diagnosis not present

## 2014-07-03 DIAGNOSIS — D696 Thrombocytopenia, unspecified: Secondary | ICD-10-CM

## 2014-07-03 DIAGNOSIS — D701 Agranulocytosis secondary to cancer chemotherapy: Secondary | ICD-10-CM | POA: Diagnosis not present

## 2014-07-03 DIAGNOSIS — R5383 Other fatigue: Secondary | ICD-10-CM

## 2014-07-03 LAB — CBC WITH DIFFERENTIAL/PLATELET
BASO%: 0.9 % (ref 0.0–2.0)
BASOS ABS: 0 10*3/uL (ref 0.0–0.1)
EOS%: 7.5 % — ABNORMAL HIGH (ref 0.0–7.0)
Eosinophils Absolute: 0.3 10*3/uL (ref 0.0–0.5)
HEMATOCRIT: 33.4 % — AB (ref 34.8–46.6)
HEMOGLOBIN: 10.9 g/dL — AB (ref 11.6–15.9)
LYMPH%: 35.8 % (ref 14.0–49.7)
MCH: 32 pg (ref 25.1–34.0)
MCHC: 32.6 g/dL (ref 31.5–36.0)
MCV: 97.9 fL (ref 79.5–101.0)
MONO#: 0.4 10*3/uL (ref 0.1–0.9)
MONO%: 11.3 % (ref 0.0–14.0)
NEUT#: 1.5 10*3/uL (ref 1.5–6.5)
NEUT%: 44.5 % (ref 38.4–76.8)
PLATELETS: 300 10*3/uL (ref 145–400)
RBC: 3.41 10*6/uL — AB (ref 3.70–5.45)
RDW: 15.7 % — AB (ref 11.2–14.5)
WBC: 3.5 10*3/uL — AB (ref 3.9–10.3)
lymph#: 1.2 10*3/uL (ref 0.9–3.3)

## 2014-07-03 LAB — COMPREHENSIVE METABOLIC PANEL (CC13)
ALBUMIN: 3.9 g/dL (ref 3.5–5.0)
ALT: 53 U/L (ref 0–55)
AST: 38 U/L — ABNORMAL HIGH (ref 5–34)
Alkaline Phosphatase: 60 U/L (ref 40–150)
Anion Gap: 7 mEq/L (ref 3–11)
BILIRUBIN TOTAL: 0.47 mg/dL (ref 0.20–1.20)
BUN: 9 mg/dL (ref 7.0–26.0)
CO2: 29 mEq/L (ref 22–29)
Calcium: 9.4 mg/dL (ref 8.4–10.4)
Chloride: 107 mEq/L (ref 98–109)
Creatinine: 0.7 mg/dL (ref 0.6–1.1)
EGFR: >90 mL/min/{1.73_m2} (ref >90)
GLUCOSE: 93 mg/dL (ref 70–140)
Potassium: 4.6 mEq/L (ref 3.5–5.1)
Sodium: 142 mEq/L (ref 136–145)
Total Protein: 6.4 g/dL (ref 6.4–8.3)

## 2014-07-03 MED ORDER — SODIUM CHLORIDE 0.9 % IV SOLN
Freq: Once | INTRAVENOUS | Status: AC
  Administered 2014-07-03: 14:00:00 via INTRAVENOUS

## 2014-07-03 MED ORDER — PACLITAXEL PROTEIN-BOUND CHEMO INJECTION 100 MG
80.0000 mg/m2 | Freq: Once | INTRAVENOUS | Status: AC
  Administered 2014-07-03: 150 mg via INTRAVENOUS
  Filled 2014-07-03: qty 30

## 2014-07-03 MED ORDER — HEPARIN SOD (PORK) LOCK FLUSH 100 UNIT/ML IV SOLN
500.0000 [IU] | Freq: Once | INTRAVENOUS | Status: AC | PRN
  Administered 2014-07-03: 500 [IU]
  Filled 2014-07-03: qty 5

## 2014-07-03 MED ORDER — SODIUM CHLORIDE 0.9 % IV SOLN
Freq: Once | INTRAVENOUS | Status: AC
  Administered 2014-07-03: 15:00:00 via INTRAVENOUS
  Filled 2014-07-03: qty 4

## 2014-07-03 MED ORDER — SODIUM CHLORIDE 0.9 % IJ SOLN
10.0000 mL | INTRAMUSCULAR | Status: DC | PRN
  Administered 2014-07-03: 10 mL
  Filled 2014-07-03: qty 10

## 2014-07-03 NOTE — Progress Notes (Signed)
Patient Care Team: Antony Contras, MD as PCP - General (Family Medicine) Elsie Stain, MD (Pulmonary Disease) Rolm Bookbinder, MD as Consulting Physician (General Surgery) Nicholas Lose, MD as Consulting Physician (Hematology and Oncology) [name], MD as Consulting Physician (Radiation Oncology) Rockwell Germany, RN as Registered Nurse Mauro Kaufmann, RN as Registered Nurse Holley Bouche, NP as Nurse Practitioner (Nurse Practitioner)  DIAGNOSIS: Breast cancer of upper-outer quadrant of left female breast   Staging form: Breast, AJCC 7th Edition     Clinical stage from 02/13/2014: Stage IIB (T3, N0, M0) - Unsigned       Staging comments: Staged at breast conference on 1.20.16      Pathologic stage from 03/11/2014: Stage IIB (T2, N1a, cM0) - Signed by Seward Grater, MD on 03/19/2014       Staging comments: Staged on final mastectomy specimen by Dr. Avis Epley    SUMMARY OF ONCOLOGIC HISTORY:   Breast cancer of upper-outer quadrant of left female breast   02/04/2014 Initial Diagnosis left breast: Invasive ductal carcinoma with DCIS ER 100%, PR 100%, HER-2 negative, Ki-67 ranged from 19-26%; third biopsy showed invasive mammary cancer with lobular features and intraductal papilloma, ER/PR positive HER-2 negative   02/11/2014 Breast MRI left breast: 3 masses that are confluent measuring 5.6 x 2.7 x 3 cm extends to the left aspect of area along the penis to involve skin of the area no lymph nodes detected right breast negative   03/07/2014 Surgery Left breast invasive ductal carcinoma grade 1; 4.8 cm with low-grade DCIS; margins negative 1/4 lymph nodes positive, ER 100% PR 100% HER-2 negative ratio 1.18 Ki-67 26% and 19% T2 N1 stage IIB   04/03/2014 -  Chemotherapy Adjuvant chemotherapy with dose dense Adriamycin and Cytoxan followed by weekly Abraxane 12    CHIEF COMPLIANT: Abraxane week 6/12  INTERVAL HISTORY: Theresa Huff is a 64 year old with above-mentioned history of left-sided  breast cancer currently on adjuvant chemotherapy. She is receiving weekly Abraxane. She is tolerating this drug remarkably well. She has no fevers, chills, nausea, vomiting, or changes in bowel or bladder habits. Her appetite is healthy. She denies mouth sores, rashes, or peripheral neuropathy symptoms. She was told she might sprout "peach fuzz" as her hair would grow back in slowly, but she has actually lost hair. Her eyebrows, lashes, and pubic hair are almost completely gone. New with this regimen is also a change to a few of her nail beds. They are starting to buckle and ridge. They do not hurt and do not look to be infected however. She is also having some blood nasal discharge over the past week or 2.  REVIEW OF SYSTEMS:   Constitutional: Denies fevers, chills or abnormal weight loss Eyes: Denies blurriness of vision Ears, nose, mouth, throat, and face: Denies mucositis or sore throat Respiratory: Denies cough, dyspnea or wheezes Cardiovascular: Denies palpitation, chest discomfort or lower extremity swelling Gastrointestinal:  Denies nausea, heartburn or change in bowel habits Skin: Denies abnormal skin rashes Lymphatics: Denies new lymphadenopathy or easy bruising Neurological:Denies numbness, tingling or new weaknesses Behavioral/Psych: Mood is stable, no new changes  Breast:  denies any pain or lumps or nodules in either breasts All other systems were reviewed with the patient and are negative.  I have reviewed the past medical history, past surgical history, social history and family history with the patient and they are unchanged from previous note.  ALLERGIES:  is allergic to hydrocodone; codeine; iodine; primidone; sulfa antibiotics; and tape.  MEDICATIONS:  Current Outpatient Prescriptions  Medication Sig Dispense Refill  . calcium carbonate (OS-CAL) 600 MG TABS tablet Take 600 mg by mouth daily.    . cetirizine (ZYRTEC) 10 MG tablet Take 10 mg by mouth daily.      .  Cholecalciferol (VITAMIN D PO) Take 2,000 Units by mouth daily.    Marland Kitchen dexamethasone (DECADRON) 4 MG tablet   1  . DULoxetine (CYMBALTA) 30 MG capsule Take 30 mg by mouth daily.     Marland Kitchen esomeprazole (NEXIUM) 20 MG packet Take 20 mg by mouth daily before breakfast.    . Eszopiclone (ESZOPICLONE) 3 MG TABS Take 3 mg by mouth at bedtime. Take immediately before bedtime    . lidocaine-prilocaine (EMLA) cream     . LORazepam (ATIVAN) 0.5 MG tablet   0  . mometasone (NASONEX) 50 MCG/ACT nasal spray Place 2 sprays into the nose daily.      . Vitamin D, Ergocalciferol, (DRISDOL) 50000 UNITS CAPS capsule Take 50,000 Units by mouth every 7 (seven) days.    . ondansetron (ZOFRAN) 8 MG tablet   1  . oxyCODONE (OXY IR/ROXICODONE) 5 MG immediate release tablet Take 1-2 tablets (5-10 mg total) by mouth every 4 (four) hours as needed for moderate pain. (Patient not taking: Reported on 07/03/2014) 30 tablet 0  . prochlorperazine (COMPAZINE) 10 MG tablet   1  . SUMAtriptan (IMITREX) 50 MG tablet Take 1 tablet (50 mg total) by mouth every 2 (two) hours as needed for migraine (Max of 2 tabs per 24 hour period.). (Patient not taking: Reported on 07/03/2014) 10 tablet 0  . triamcinolone cream (KENALOG) 0.1 %      No current facility-administered medications for this visit.    PHYSICAL EXAMINATION: ECOG PERFORMANCE STATUS: 1 - Symptomatic but completely ambulatory  Filed Vitals:   07/03/14 1302  BP: 132/78  Pulse: 66  Temp: 98.1 F (36.7 C)  Resp: 18   Filed Weights   07/03/14 1302  Weight: 159 lb 6.4 oz (72.303 kg)    GENERAL:alert, no distress and comfortable SKIN: skin color, texture, turgor are normal, no rashes or significant lesions EYES: normal, Conjunctiva are pink and non-injected, sclera clear OROPHARYNX:no exudate, no erythema and lips, buccal mucosa, and tongue normal  NECK: supple, thyroid normal size, non-tender, without nodularity LYMPH:  no palpable lymphadenopathy in the cervical, axillary  or inguinal LUNGS: clear to auscultation and percussion with normal breathing effort HEART: regular rate & rhythm and no murmurs and no lower extremity edema ABDOMEN:abdomen soft, non-tender and normal bowel sounds Musculoskeletal:no cyanosis of digits and no clubbing  NEURO: alert & oriented x 3 with fluent speech, no focal motor/sensory deficits  LABORATORY DATA:  I have reviewed the data as listed   Chemistry      Component Value Date/Time   NA 142 07/03/2014 1221   NA 139 05/03/2014 1946   K 4.6 07/03/2014 1221   K 4.1 05/03/2014 1946   CL 103 05/03/2014 1946   CO2 29 07/03/2014 1221   CO2 26 05/03/2014 1946   BUN 9.0 07/03/2014 1221   BUN 17 05/03/2014 1946   CREATININE 0.7 07/03/2014 1221   CREATININE 0.77 05/03/2014 1946      Component Value Date/Time   CALCIUM 9.4 07/03/2014 1221   CALCIUM 9.6 05/03/2014 1946   ALKPHOS 60 07/03/2014 1221   ALKPHOS 76 09/14/2010 1708   AST 38* 07/03/2014 1221   AST 18 09/14/2010 1708   ALT 53 07/03/2014 1221   ALT  21 09/14/2010 1708   BILITOT 0.47 07/03/2014 1221   BILITOT 0.4 09/14/2010 1708       Lab Results  Component Value Date   WBC 3.5* 07/03/2014   HGB 10.9* 07/03/2014   HCT 33.4* 07/03/2014   MCV 97.9 07/03/2014   PLT 300 07/03/2014   NEUTROABS 1.5 07/03/2014    ASSESSMENT & PLAN:  Breast cancer of upper-outer quadrant of left female breast Left breast invasive ductal carcinoma grade 1; 4.8 cm with low-grade DCIS; margins negative 1/4 lymph nodes positive, ER 100% PR 100% HER-2 negative ratio 1.18 Ki-67 26% and 19% T2 N1 stage IIB  Treatment plan:  1. Systemic chemotherapy with dose dense Adriamycin and Cytoxan 4 followed by Abraxane weekly 12 2. Followed by adjuvant radiation 3. Followed by adjuvant antiestrogen therapy with anastrozole for 5 years  Current treatment: Cycle 6/12 Abraxane  Chemotherapy toxicities: 1. Profound fatigue for 2-3 days after chemotherapy starting day 4 2. Grade 4 neutropenia:  decreased the dosage of cycle 2 chemotherapy of Adriamycin 50 mg/m and Cytoxan 500 mg/m 3. Thrombocytopenia grade 1 4. Ocular migraine after cycle 3 Adriamycin and Cytoxan 5. Profound leukocytosis white count 111,000 due to Neulasta 6. Nail dyscrasia: keep nails clean and trimmed.  7. Bloody nasal discharge: likely from dry air. Encouraged to use saline nasal spray for relief   I reviewed her blood work from today and they're adequate for treatment.  Closely monitoring for chemotherapy toxicities. Return to clinic in 2 weeks for 8th cycle of Abraxane.  No orders of the defined types were placed in this encounter.   The patient has a good understanding of the overall plan. she agrees with it. she will call with any problems that may develop before the next visit here.   Laurie Panda, NP 07/03/2014

## 2014-07-03 NOTE — Patient Instructions (Signed)
[facility] Discharge Instructions for Patients Receiving Chemotherapy  Today you received the following chemotherapy agents: Abraxane   To help prevent nausea and vomiting after your treatment, we encourage you to take your nausea medication as directed.    If you develop nausea and vomiting that is not controlled by your nausea medication, call the clinic.   BELOW ARE SYMPTOMS THAT SHOULD BE REPORTED IMMEDIATELY:  *FEVER GREATER THAN 100.5 F  *CHILLS WITH OR WITHOUT FEVER  NAUSEA AND VOMITING THAT IS NOT CONTROLLED WITH YOUR NAUSEA MEDICATION  *UNUSUAL SHORTNESS OF BREATH  *UNUSUAL BRUISING OR BLEEDING  TENDERNESS IN MOUTH AND THROAT WITH OR WITHOUT PRESENCE OF ULCERS  *URINARY PROBLEMS  *BOWEL PROBLEMS  UNUSUAL RASH Items with * indicate a potential emergency and should be followed up as soon as possible.  Feel free to call the clinic you have any questions or concerns. The clinic phone number is [phone].  Please show the CHEMO ALERT CARD at check-in to the Emergency Department and triage nurse.   

## 2014-07-10 ENCOUNTER — Other Ambulatory Visit (HOSPITAL_BASED_OUTPATIENT_CLINIC_OR_DEPARTMENT_OTHER): Payer: 59

## 2014-07-10 ENCOUNTER — Ambulatory Visit (HOSPITAL_BASED_OUTPATIENT_CLINIC_OR_DEPARTMENT_OTHER): Payer: 59

## 2014-07-10 VITALS — BP 129/71 | HR 82 | Temp 98.4°F | Resp 18

## 2014-07-10 DIAGNOSIS — C773 Secondary and unspecified malignant neoplasm of axilla and upper limb lymph nodes: Secondary | ICD-10-CM | POA: Diagnosis not present

## 2014-07-10 DIAGNOSIS — Z5111 Encounter for antineoplastic chemotherapy: Secondary | ICD-10-CM | POA: Diagnosis not present

## 2014-07-10 DIAGNOSIS — C50412 Malignant neoplasm of upper-outer quadrant of left female breast: Secondary | ICD-10-CM | POA: Diagnosis not present

## 2014-07-10 LAB — COMPREHENSIVE METABOLIC PANEL (CC13)
ALBUMIN: 3.7 g/dL (ref 3.5–5.0)
ALT: 41 U/L (ref 0–55)
AST: 26 U/L (ref 5–34)
Alkaline Phosphatase: 66 U/L (ref 40–150)
Anion Gap: 9 mEq/L (ref 3–11)
BUN: 9.4 mg/dL (ref 7.0–26.0)
CALCIUM: 9.2 mg/dL (ref 8.4–10.4)
CO2: 25 mEq/L (ref 22–29)
Chloride: 108 mEq/L (ref 98–109)
Creatinine: 0.8 mg/dL (ref 0.6–1.1)
EGFR: 82 mL/min/{1.73_m2} — AB (ref >90)
GLUCOSE: 80 mg/dL (ref 70–140)
Potassium: 4.4 mEq/L (ref 3.5–5.1)
Sodium: 143 mEq/L (ref 136–145)
Total Bilirubin: 0.49 mg/dL (ref 0.20–1.20)
Total Protein: 6.1 g/dL — ABNORMAL LOW (ref 6.4–8.3)

## 2014-07-10 LAB — CBC WITH DIFFERENTIAL/PLATELET
BASO%: 1 % (ref 0.0–2.0)
BASOS ABS: 0 10*3/uL (ref 0.0–0.1)
EOS%: 10.8 % — ABNORMAL HIGH (ref 0.0–7.0)
Eosinophils Absolute: 0.3 10*3/uL (ref 0.0–0.5)
HCT: 34.6 % — ABNORMAL LOW (ref 34.8–46.6)
HGB: 11.2 g/dL — ABNORMAL LOW (ref 11.6–15.9)
LYMPH%: 34.8 % (ref 14.0–49.7)
MCH: 31.5 pg (ref 25.1–34.0)
MCHC: 32.4 g/dL (ref 31.5–36.0)
MCV: 97.5 fL (ref 79.5–101.0)
MONO#: 0.3 10*3/uL (ref 0.1–0.9)
MONO%: 8.5 % (ref 0.0–14.0)
NEUT%: 44.9 % (ref 38.4–76.8)
NEUTROS ABS: 1.4 10*3/uL — AB (ref 1.5–6.5)
PLATELETS: 270 10*3/uL (ref 145–400)
RBC: 3.55 10*6/uL — AB (ref 3.70–5.45)
RDW: 14.6 % — AB (ref 11.2–14.5)
WBC: 3.1 10*3/uL — AB (ref 3.9–10.3)
lymph#: 1.1 10*3/uL (ref 0.9–3.3)

## 2014-07-10 MED ORDER — HEPARIN SOD (PORK) LOCK FLUSH 100 UNIT/ML IV SOLN
500.0000 [IU] | Freq: Once | INTRAVENOUS | Status: AC | PRN
  Administered 2014-07-10: 500 [IU]
  Filled 2014-07-10: qty 5

## 2014-07-10 MED ORDER — SODIUM CHLORIDE 0.9 % IV SOLN
Freq: Once | INTRAVENOUS | Status: AC
  Administered 2014-07-10: 09:00:00 via INTRAVENOUS

## 2014-07-10 MED ORDER — SODIUM CHLORIDE 0.9 % IJ SOLN
10.0000 mL | INTRAMUSCULAR | Status: DC | PRN
  Administered 2014-07-10: 10 mL
  Filled 2014-07-10: qty 10

## 2014-07-10 MED ORDER — PACLITAXEL PROTEIN-BOUND CHEMO INJECTION 100 MG
80.0000 mg/m2 | Freq: Once | INTRAVENOUS | Status: AC
  Administered 2014-07-10: 150 mg via INTRAVENOUS
  Filled 2014-07-10: qty 30

## 2014-07-10 MED ORDER — ONDANSETRON HCL 40 MG/20ML IJ SOLN
Freq: Once | INTRAMUSCULAR | Status: AC
  Administered 2014-07-10: 09:00:00 via INTRAVENOUS
  Filled 2014-07-10: qty 4

## 2014-07-10 NOTE — Progress Notes (Signed)
ANC 1.4 Heather NP aware and okay to treat per [facility] NP. Pt education performed on neutropenic precautions, and verbalizes understanding.

## 2014-07-10 NOTE — Patient Instructions (Signed)
[facility] Discharge Instructions for Patients Receiving Chemotherapy  Today you received the following chemotherapy agents Abraxane  To help prevent nausea and vomiting after your treatment, we encourage you to take your nausea medication as directed   If you develop nausea and vomiting that is not controlled by your nausea medication, call the clinic.   BELOW ARE SYMPTOMS THAT SHOULD BE REPORTED IMMEDIATELY:  *FEVER GREATER THAN 100.5 F  *CHILLS WITH OR WITHOUT FEVER  NAUSEA AND VOMITING THAT IS NOT CONTROLLED WITH YOUR NAUSEA MEDICATION  *UNUSUAL SHORTNESS OF BREATH  *UNUSUAL BRUISING OR BLEEDING  TENDERNESS IN MOUTH AND THROAT WITH OR WITHOUT PRESENCE OF ULCERS  *URINARY PROBLEMS  *BOWEL PROBLEMS  UNUSUAL RASH Items with * indicate a potential emergency and should be followed up as soon as possible.  Feel free to call the clinic you have any questions or concerns. The clinic phone number is [phone].  Please show the [place] at check-in to the Emergency Department and triage nurse.    Neutropenia Neutropenia is a condition that occurs when the level of a certain type of white blood cell (neutrophil) in your body becomes lower than normal. Neutrophils are made in the bone marrow and fight infections. These cells protect against bacteria and viruses. The fewer neutrophils you have, and the longer your body remains without them, the greater your risk of getting a severe infection becomes. CAUSES  The cause of neutropenia may be hard to determine. However, it is usually due to 3 main problems:   Decreased production of neutrophils. This may be due to:  Certain medicines such as chemotherapy.  Genetic problems.  Cancer.  Radiation treatments.  Vitamin deficiency.  Some pesticides.  Increased destruction of neutrophils. This may be due to:  Overwhelming infections.  Hemolytic anemia. This is when the body destroys its own blood  cells.  Chemotherapy.  Neutrophils moving to areas of the body where they cannot fight infections. This may be due to:  Dialysis procedures.  Conditions where the spleen becomes enlarged. Neutrophils are held in the spleen and are not available to the rest of the body.  Overwhelming infections. The neutrophils are held in the area of the infection and are not available to the rest of the body. SYMPTOMS  There are no specific symptoms of neutropenia. The lack of neutrophils can result in an infection, and an infection can cause various problems. DIAGNOSIS  Diagnosis is made by a blood test. A complete blood count is performed. The normal level of neutrophils in human blood differs with age and race. Infants have lower counts than older children and adults. African Americans have lower counts than Caucasians or Asians. The average adult level is 1500 cells/mm3 of blood. Neutrophil counts are interpreted as follows:  Greater than 1000 cells/mm3 gives normal protection against infection.  500 to 1000 cells/mm3 gives an increased risk for infection.  200 to 500 cells/mm3 is a greater risk for severe infection.  Lower than 200 cells/mm3 is a marked risk of infection. This may require hospitalization and treatment with antibiotic medicines. TREATMENT  Treatment depends on the underlying cause, severity, and presence of infections or symptoms. It also depends on your health. Your caregiver will discuss the treatment plan with you. Mild cases are often easily treated and have a good outcome. Preventative measures may also be started to limit your risk of infections. Treatment can include:  Taking antibiotics.  Stopping medicines that are known to cause neutropenia.  Correcting  nutritional deficiencies by eating green vegetables to supply folic acid and taking vitamin B supplements.  Stopping exposure to pesticides if your neutropenia is related to pesticide exposure.  Taking a blood growth  factor called sargramostim, pegfilgrastim, or filgrastim if you are undergoing chemotherapy for cancer. This stimulates white blood cell production.  Removal of the spleen if you have Felty's syndrome and have repeated infections. HOME CARE INSTRUCTIONS   Follow your caregiver's instructions about when you need to have blood work done.  Wash your hands often. Make sure others who come in contact with you also wash their hands.  Wash raw fruits and vegetables before eating them. They can carry bacteria and fungi.  Avoid people with colds or spreadable (contagious) diseases (chickenpox, herpes zoster, influenza).  Avoid large crowds.  Avoid construction areas. The dust can release fungus into the air.  Be cautious around children in daycare or school environments.  Take care of your respiratory system by coughing and deep breathing.  Bathe daily.  Protect your skin from cuts and burns.  Do not work in the garden or with flowers and plants.  Care for the mouth before and after meals by brushing with a soft toothbrush. If you have mucositis, do not use mouthwash. Mouthwash contains alcohol and can dry out the mouth even more.  Clean the area between the genitals and the anus (perineal area) after urination and bowel movements. Women need to wipe from front to back.  Use a water soluble lubricant during sexual intercourse and practice good hygiene after. Do not have intercourse if you are severely neutropenic. Check with your caregiver for guidelines.  Exercise daily as tolerated.  Avoid people who were vaccinated with a live vaccine in the past 30 days. You should not receive live vaccines (polio, typhoid).  Do not provide direct care for pets. Avoid animal droppings. Do not clean litter boxes and bird cages.  Do not share food utensils.  Do not use tampons, enemas, or rectal suppositories unless directed by your caregiver.  Use an electric razor to remove hair.  Wash your  hands after handling magazines, letters, and newspapers. SEEK IMMEDIATE MEDICAL CARE IF:   You have a fever.  You have chills or start to shake.  You feel nauseous or vomit.  You develop mouth sores.  You develop aches and pains.  You have redness and swelling around open wounds.  Your skin is warm to the touch.  You have pus coming from your wounds.  You develop swollen lymph nodes.  You feel weak or fatigued.  You develop red streaks on the skin. MAKE SURE YOU:  Understand these instructions.  Will watch your condition.  Will get help right away if you are not doing well or get worse. Document Released: 07/03/2001 Document Revised: 04/05/2011 Document Reviewed: 07/31/2010 [facility] Patient Information 2015 [place], [place]. This information is not intended to replace advice given to you by your health care provider. Make sure you discuss any questions you have with your health care provider.

## 2014-07-15 ENCOUNTER — Encounter (HOSPITAL_COMMUNITY): Payer: Self-pay

## 2014-07-16 NOTE — Assessment & Plan Note (Signed)
Left breast invasive ductal carcinoma grade 1; 4.8 cm with low-grade DCIS; margins negative 1/4 lymph nodes positive, ER 100% PR 100% HER-2 negative ratio 1.18 Ki-67 26% and 19% T2 N1 stage IIB  Treatment plan:  1. Systemic chemotherapy with dose dense Adriamycin and Cytoxan 4 followed by Abraxane weekly 12 2. Followed by adjuvant radiation 3. Followed by adjuvant antiestrogen therapy with anastrozole for 5 years  Current treatment: Cycle 8/12 Abraxane  Chemotherapy toxicities: 1. Profound fatigue for 2-3 days after chemotherapy starting day 4 2. Grade 4 neutropenia: decreased the dosage of cycle 2 chemotherapy of Adriamycin 50 mg/m and Cytoxan 500 mg/m 3. Thrombocytopenia grade 1 4. Ocular migraine after cycle 3 Adriamycin and Cytoxan 5. Profound leukocytosis white count 111,000 due to Neulasta 6. Nail dyscrasia: keep nails clean and trimmed.  7. Bloody nasal discharge: likely from dry air. Encouraged to use saline nasal spray for relief   I reviewed her blood work from today and they're adequate for treatment.  Closely monitoring for chemotherapy toxicities. Return to clinic in 2 weeks for 10th cycle of Abraxane.

## 2014-07-17 ENCOUNTER — Other Ambulatory Visit (HOSPITAL_BASED_OUTPATIENT_CLINIC_OR_DEPARTMENT_OTHER): Payer: 59

## 2014-07-17 ENCOUNTER — Ambulatory Visit (HOSPITAL_BASED_OUTPATIENT_CLINIC_OR_DEPARTMENT_OTHER): Payer: 59 | Admitting: Hematology and Oncology

## 2014-07-17 ENCOUNTER — Telehealth: Payer: Self-pay | Admitting: Hematology and Oncology

## 2014-07-17 ENCOUNTER — Ambulatory Visit (HOSPITAL_BASED_OUTPATIENT_CLINIC_OR_DEPARTMENT_OTHER): Payer: 59

## 2014-07-17 ENCOUNTER — Encounter: Payer: Self-pay | Admitting: Hematology and Oncology

## 2014-07-17 VITALS — BP 136/74 | HR 77 | Temp 98.2°F | Resp 18 | Ht 65.0 in | Wt 162.1 lb

## 2014-07-17 DIAGNOSIS — Z5112 Encounter for antineoplastic immunotherapy: Secondary | ICD-10-CM

## 2014-07-17 DIAGNOSIS — D696 Thrombocytopenia, unspecified: Secondary | ICD-10-CM

## 2014-07-17 DIAGNOSIS — D709 Neutropenia, unspecified: Secondary | ICD-10-CM | POA: Diagnosis not present

## 2014-07-17 DIAGNOSIS — C50412 Malignant neoplasm of upper-outer quadrant of left female breast: Secondary | ICD-10-CM

## 2014-07-17 DIAGNOSIS — C773 Secondary and unspecified malignant neoplasm of axilla and upper limb lymph nodes: Secondary | ICD-10-CM

## 2014-07-17 DIAGNOSIS — D72829 Elevated white blood cell count, unspecified: Secondary | ICD-10-CM

## 2014-07-17 DIAGNOSIS — R5383 Other fatigue: Secondary | ICD-10-CM

## 2014-07-17 LAB — COMPREHENSIVE METABOLIC PANEL (CC13)
ALBUMIN: 3.7 g/dL (ref 3.5–5.0)
ALT: 50 U/L (ref 0–55)
AST: 34 U/L (ref 5–34)
Alkaline Phosphatase: 56 U/L (ref 40–150)
Anion Gap: 6 mEq/L (ref 3–11)
BUN: 9.9 mg/dL (ref 7.0–26.0)
CALCIUM: 9.1 mg/dL (ref 8.4–10.4)
CHLORIDE: 108 meq/L (ref 98–109)
CO2: 28 meq/L (ref 22–29)
Creatinine: 0.7 mg/dL (ref 0.6–1.1)
GLUCOSE: 89 mg/dL (ref 70–140)
POTASSIUM: 4.3 meq/L (ref 3.5–5.1)
SODIUM: 143 meq/L (ref 136–145)
Total Bilirubin: 0.47 mg/dL (ref 0.20–1.20)
Total Protein: 5.9 g/dL — ABNORMAL LOW (ref 6.4–8.3)

## 2014-07-17 LAB — CBC WITH DIFFERENTIAL/PLATELET
BASO%: 1.3 % (ref 0.0–2.0)
BASOS ABS: 0 10*3/uL (ref 0.0–0.1)
EOS%: 6 % (ref 0.0–7.0)
Eosinophils Absolute: 0.2 10*3/uL (ref 0.0–0.5)
HEMATOCRIT: 33.3 % — AB (ref 34.8–46.6)
HEMOGLOBIN: 11.1 g/dL — AB (ref 11.6–15.9)
LYMPH#: 1.1 10*3/uL (ref 0.9–3.3)
LYMPH%: 35.2 % (ref 14.0–49.7)
MCH: 31.9 pg (ref 25.1–34.0)
MCHC: 33.3 g/dL (ref 31.5–36.0)
MCV: 95.7 fL (ref 79.5–101.0)
MONO#: 0.3 10*3/uL (ref 0.1–0.9)
MONO%: 11.6 % (ref 0.0–14.0)
NEUT%: 45.9 % (ref 38.4–76.8)
NEUTROS ABS: 1.4 10*3/uL — AB (ref 1.5–6.5)
PLATELETS: 307 10*3/uL (ref 145–400)
RBC: 3.48 10*6/uL — ABNORMAL LOW (ref 3.70–5.45)
RDW: 15 % — ABNORMAL HIGH (ref 11.2–14.5)
WBC: 3 10*3/uL — ABNORMAL LOW (ref 3.9–10.3)

## 2014-07-17 MED ORDER — HEPARIN SOD (PORK) LOCK FLUSH 100 UNIT/ML IV SOLN
500.0000 [IU] | Freq: Once | INTRAVENOUS | Status: AC | PRN
  Administered 2014-07-17: 500 [IU]
  Filled 2014-07-17: qty 5

## 2014-07-17 MED ORDER — SODIUM CHLORIDE 0.9 % IV SOLN
Freq: Once | INTRAVENOUS | Status: AC
  Administered 2014-07-17: 10:00:00 via INTRAVENOUS

## 2014-07-17 MED ORDER — PACLITAXEL PROTEIN-BOUND CHEMO INJECTION 100 MG
80.0000 mg/m2 | Freq: Once | INTRAVENOUS | Status: AC
  Administered 2014-07-17: 150 mg via INTRAVENOUS
  Filled 2014-07-17: qty 30

## 2014-07-17 MED ORDER — SODIUM CHLORIDE 0.9 % IV SOLN
Freq: Once | INTRAVENOUS | Status: AC
  Administered 2014-07-17: 11:00:00 via INTRAVENOUS
  Filled 2014-07-17: qty 4

## 2014-07-17 MED ORDER — SODIUM CHLORIDE 0.9 % IJ SOLN
10.0000 mL | INTRAMUSCULAR | Status: DC | PRN
  Administered 2014-07-17: 10 mL
  Filled 2014-07-17: qty 10

## 2014-07-17 NOTE — Progress Notes (Signed)
Ok to treat with ANC of. 1.4 

## 2014-07-17 NOTE — Telephone Encounter (Signed)
Per pof,patient already on schedule for two weeks

## 2014-07-17 NOTE — Patient Instructions (Signed)
[facility] Discharge Instructions for Patients Receiving Chemotherapy  Today you received the following chemotherapy agents: Abraxane   To help prevent nausea and vomiting after your treatment, we encourage you to take your nausea medication as directed.    If you develop nausea and vomiting that is not controlled by your nausea medication, call the clinic.   BELOW ARE SYMPTOMS THAT SHOULD BE REPORTED IMMEDIATELY:  *FEVER GREATER THAN 100.5 F  *CHILLS WITH OR WITHOUT FEVER  NAUSEA AND VOMITING THAT IS NOT CONTROLLED WITH YOUR NAUSEA MEDICATION  *UNUSUAL SHORTNESS OF BREATH  *UNUSUAL BRUISING OR BLEEDING  TENDERNESS IN MOUTH AND THROAT WITH OR WITHOUT PRESENCE OF ULCERS  *URINARY PROBLEMS  *BOWEL PROBLEMS  UNUSUAL RASH Items with * indicate a potential emergency and should be followed up as soon as possible.  Feel free to call the clinic you have any questions or concerns. The clinic phone number is [phone].  Please show the CHEMO ALERT CARD at check-in to the Emergency Department and triage nurse.   

## 2014-07-17 NOTE — Progress Notes (Signed)
Patient Care Team: Antony Contras, MD as PCP - General (Family Medicine) Elsie Stain, MD (Pulmonary Disease) Rolm Bookbinder, MD as Consulting Physician (General Surgery) Nicholas Lose, MD as Consulting Physician (Hematology and Oncology) [name], MD as Consulting Physician (Radiation Oncology) Rockwell Germany, RN as Registered Nurse Mauro Kaufmann, RN as Registered Nurse Holley Bouche, NP as Nurse Practitioner (Nurse Practitioner)  DIAGNOSIS: Breast cancer of upper-outer quadrant of left female breast   Staging form: Breast, AJCC 7th Edition     Clinical stage from 02/13/2014: Stage IIB (T3, N0, M0) - Unsigned       Staging comments: Staged at breast conference on 1.20.16      Pathologic stage from 03/11/2014: Stage IIB (T2, N1a, cM0) - Signed by Seward Grater, MD on 03/19/2014       Staging comments: Staged on final mastectomy specimen by Dr. Avis Epley    SUMMARY OF ONCOLOGIC HISTORY:   Breast cancer of upper-outer quadrant of left female breast   02/04/2014 Initial Diagnosis left breast: Invasive ductal carcinoma with DCIS ER 100%, PR 100%, HER-2 negative, Ki-67 ranged from 19-26%; third biopsy showed invasive mammary cancer with lobular features and intraductal papilloma, ER/PR positive HER-2 negative   02/11/2014 Breast MRI left breast: 3 masses that are confluent measuring 5.6 x 2.7 x 3 cm extends to the left aspect of area along the penis to involve skin of the area no lymph nodes detected right breast negative   03/07/2014 Surgery Left breast invasive ductal carcinoma grade 1; 4.8 cm with low-grade DCIS; margins negative 1/4 lymph nodes positive, ER 100% PR 100% HER-2 negative ratio 1.18 Ki-67 26% and 19% T2 N1 stage IIB   04/03/2014 -  Chemotherapy Adjuvant chemotherapy with dose dense Adriamycin and Cytoxan followed by weekly Abraxane 12    CHIEF COMPLIANT: Abraxane 10/12  INTERVAL HISTORY: JANALYNN EDER is a 64 year old with above-mentioned history of left breast  cancer currently on adjuvant chemotherapy. She is tolerating Abraxane extremely well. Denies any nausea vomiting denies any bowel issues denies any neuropathy. Her absolute neutrophil count has been 1.4 and she has been able to continue with her chemotherapy without interruptions. Her husband is moving to [place] this week for 2 years. She plans to join him once she finishes with her breast cancer treatment.  REVIEW OF SYSTEMS:   Constitutional: Denies fevers, chills or abnormal weight loss Eyes: Denies blurriness of vision Ears, nose, mouth, throat, and face: Denies mucositis or sore throat Respiratory: Denies cough, dyspnea or wheezes Cardiovascular: Denies palpitation, chest discomfort or lower extremity swelling Gastrointestinal:  Denies nausea, heartburn or change in bowel habits Skin: Denies abnormal skin rashes Lymphatics: Denies new lymphadenopathy or easy bruising Neurological:Denies numbness, tingling or new weaknesses Behavioral/Psych: Mood is stable, no new changes  Breast:  denies any pain or lumps or nodules in either breasts All other systems were reviewed with the patient and are negative.  I have reviewed the past medical history, past surgical history, social history and family history with the patient and they are unchanged from previous note.  ALLERGIES:  is allergic to hydrocodone; codeine; iodine; primidone; sulfa antibiotics; and tape.  MEDICATIONS:  Current Outpatient Prescriptions  Medication Sig Dispense Refill  . calcium carbonate (OS-CAL) 600 MG TABS tablet Take 600 mg by mouth daily.    . cetirizine (ZYRTEC) 10 MG tablet Take 10 mg by mouth daily.      . Cholecalciferol (VITAMIN D PO) Take 2,000 Units by mouth daily.    Marland Kitchen  dexamethasone (DECADRON) 4 MG tablet   1  . DULoxetine (CYMBALTA) 30 MG capsule Take 30 mg by mouth daily.     Marland Kitchen esomeprazole (NEXIUM) 20 MG packet Take 20 mg by mouth daily before breakfast.    . Eszopiclone (ESZOPICLONE) 3 MG TABS Take  3 mg by mouth at bedtime. Take immediately before bedtime    . lidocaine-prilocaine (EMLA) cream     . LORazepam (ATIVAN) 0.5 MG tablet   0  . mometasone (NASONEX) 50 MCG/ACT nasal spray Place 2 sprays into the nose daily.      . ondansetron (ZOFRAN) 8 MG tablet   1  . oxyCODONE (OXY IR/ROXICODONE) 5 MG immediate release tablet Take 1-2 tablets (5-10 mg total) by mouth every 4 (four) hours as needed for moderate pain. 30 tablet 0  . prochlorperazine (COMPAZINE) 10 MG tablet   1  . SUMAtriptan (IMITREX) 50 MG tablet Take 1 tablet (50 mg total) by mouth every 2 (two) hours as needed for migraine (Max of 2 tabs per 24 hour period.). 10 tablet 0  . triamcinolone cream (KENALOG) 0.1 %     . Vitamin D, Ergocalciferol, (DRISDOL) 50000 UNITS CAPS capsule Take 50,000 Units by mouth every 7 (seven) days.     No current facility-administered medications for this visit.    PHYSICAL EXAMINATION: ECOG PERFORMANCE STATUS: 0 - Asymptomatic  Filed Vitals:   07/17/14 0945  BP: 136/74  Pulse: 77  Temp: 98.2 F (36.8 C)  Resp: 18   Filed Weights   07/17/14 0945  Weight: 162 lb 1.6 oz (73.528 kg)    GENERAL:alert, no distress and comfortable SKIN: skin color, texture, turgor are normal, no rashes or significant lesions EYES: normal, Conjunctiva are pink and non-injected, sclera clear OROPHARYNX:no exudate, no erythema and lips, buccal mucosa, and tongue normal  NECK: supple, thyroid normal size, non-tender, without nodularity LYMPH:  no palpable lymphadenopathy in the cervical, axillary or inguinal LUNGS: clear to auscultation and percussion with normal breathing effort HEART: regular rate & rhythm and no murmurs and no lower extremity edema ABDOMEN:abdomen soft, non-tender and normal bowel sounds Musculoskeletal:no cyanosis of digits and no clubbing  NEURO: alert & oriented x 3 with fluent speech, no focal motor/sensory deficits  LABORATORY DATA:  I have reviewed the data as listed    Chemistry      Component Value Date/Time   NA 143 07/17/2014 0856   NA 139 05/03/2014 1946   K 4.3 07/17/2014 0856   K 4.1 05/03/2014 1946   CL 103 05/03/2014 1946   CO2 28 07/17/2014 0856   CO2 26 05/03/2014 1946   BUN 9.9 07/17/2014 0856   BUN 17 05/03/2014 1946   CREATININE 0.7 07/17/2014 0856   CREATININE 0.77 05/03/2014 1946      Component Value Date/Time   CALCIUM 9.1 07/17/2014 0856   CALCIUM 9.6 05/03/2014 1946   ALKPHOS 56 07/17/2014 0856   ALKPHOS 76 09/14/2010 1708   AST 34 07/17/2014 0856   AST 18 09/14/2010 1708   ALT 50 07/17/2014 0856   ALT 21 09/14/2010 1708   BILITOT 0.47 07/17/2014 0856   BILITOT 0.4 09/14/2010 1708       Lab Results  Component Value Date   WBC 3.0* 07/17/2014   HGB 11.1* 07/17/2014   HCT 33.3* 07/17/2014   MCV 95.7 07/17/2014   PLT 307 07/17/2014   NEUTROABS 1.4* 07/17/2014   ASSESSMENT & PLAN:  Breast cancer of upper-outer quadrant of left female breast Left breast invasive  ductal carcinoma grade 1; 4.8 cm with low-grade DCIS; margins negative 1/4 lymph nodes positive, ER 100% PR 100% HER-2 negative ratio 1.18 Ki-67 26% and 19% T2 N1 stage IIB  Treatment plan:  1. Systemic chemotherapy with dose dense Adriamycin and Cytoxan 4 followed by Abraxane weekly 12 2. Followed by adjuvant radiation 3. Followed by adjuvant antiestrogen therapy with anastrozole for 5 years  Current treatment: Cycle 8/12 Abraxane  Chemotherapy toxicities: 1. Profound fatigue for 2-3 days after chemotherapy starting day 4 2. Grade 4 neutropenia: decreased the dosage of cycle 2 chemotherapy of Adriamycin 50 mg/m and Cytoxan 500 mg/m 3. Thrombocytopenia grade 1 4. Ocular migraine after cycle 3 Adriamycin and Cytoxan 5. Profound leukocytosis white count 111,000 due to Neulasta 6. Nail dyscrasia: keep nails clean and trimmed.  7. Bloody nasal discharge: likely from dry air. Encouraged to use saline nasal spray for relief   I reviewed her blood work  from today and they're adequate for treatment.  Closely monitoring for chemotherapy toxicities. Return to clinic in 2 weeks for 10th cycle of Abraxane.    No orders of the defined types were placed in this encounter.   The patient has a good understanding of the overall plan. she agrees with it. she will call with any problems that may develop before the next visit here.   Rulon Eisenmenger, MD     Use

## 2014-07-18 ENCOUNTER — Telehealth: Payer: Self-pay | Admitting: *Deleted

## 2014-07-18 NOTE — Telephone Encounter (Signed)
Spoke with patient to follow up during treatment.  She is doing well.  Informed her I would see her on 7/6.

## 2014-07-24 ENCOUNTER — Other Ambulatory Visit (HOSPITAL_BASED_OUTPATIENT_CLINIC_OR_DEPARTMENT_OTHER): Payer: 59

## 2014-07-24 ENCOUNTER — Ambulatory Visit (HOSPITAL_BASED_OUTPATIENT_CLINIC_OR_DEPARTMENT_OTHER): Payer: 59

## 2014-07-24 DIAGNOSIS — C50412 Malignant neoplasm of upper-outer quadrant of left female breast: Secondary | ICD-10-CM | POA: Diagnosis not present

## 2014-07-24 DIAGNOSIS — C773 Secondary and unspecified malignant neoplasm of axilla and upper limb lymph nodes: Secondary | ICD-10-CM

## 2014-07-24 DIAGNOSIS — Z5111 Encounter for antineoplastic chemotherapy: Secondary | ICD-10-CM

## 2014-07-24 LAB — CBC WITH DIFFERENTIAL/PLATELET
BASO%: 1 % (ref 0.0–2.0)
BASOS ABS: 0 10*3/uL (ref 0.0–0.1)
EOS ABS: 0.1 10*3/uL (ref 0.0–0.5)
EOS%: 3.2 % (ref 0.0–7.0)
HCT: 33.7 % — ABNORMAL LOW (ref 34.8–46.6)
HGB: 11.4 g/dL — ABNORMAL LOW (ref 11.6–15.9)
LYMPH%: 34.7 % (ref 14.0–49.7)
MCH: 32.1 pg (ref 25.1–34.0)
MCHC: 33.7 g/dL (ref 31.5–36.0)
MCV: 95.2 fL (ref 79.5–101.0)
MONO#: 0.4 10*3/uL (ref 0.1–0.9)
MONO%: 12.6 % (ref 0.0–14.0)
NEUT#: 1.5 10*3/uL (ref 1.5–6.5)
NEUT%: 48.5 % (ref 38.4–76.8)
PLATELETS: 306 10*3/uL (ref 145–400)
RBC: 3.54 10*6/uL — AB (ref 3.70–5.45)
RDW: 14.2 % (ref 11.2–14.5)
WBC: 3.1 10*3/uL — AB (ref 3.9–10.3)
lymph#: 1.1 10*3/uL (ref 0.9–3.3)

## 2014-07-24 LAB — COMPREHENSIVE METABOLIC PANEL (CC13)
ALK PHOS: 63 U/L (ref 40–150)
ALT: 24 U/L (ref 0–55)
ANION GAP: 9 meq/L (ref 3–11)
AST: 18 U/L (ref 5–34)
Albumin: 3.7 g/dL (ref 3.5–5.0)
BUN: 8.9 mg/dL (ref 7.0–26.0)
CHLORIDE: 106 meq/L (ref 98–109)
CO2: 26 mEq/L (ref 22–29)
Calcium: 9.3 mg/dL (ref 8.4–10.4)
Creatinine: 0.8 mg/dL (ref 0.6–1.1)
EGFR: 80 mL/min/{1.73_m2} — ABNORMAL LOW (ref >90)
Glucose: 86 mg/dl (ref 70–140)
POTASSIUM: 4.5 meq/L (ref 3.5–5.1)
Sodium: 141 mEq/L (ref 136–145)
TOTAL PROTEIN: 6 g/dL — AB (ref 6.4–8.3)
Total Bilirubin: 0.58 mg/dL (ref 0.20–1.20)

## 2014-07-24 MED ORDER — SODIUM CHLORIDE 0.9 % IV SOLN
Freq: Once | INTRAVENOUS | Status: AC
  Administered 2014-07-24: 09:00:00 via INTRAVENOUS
  Filled 2014-07-24: qty 4

## 2014-07-24 MED ORDER — SODIUM CHLORIDE 0.9 % IJ SOLN
10.0000 mL | INTRAMUSCULAR | Status: DC | PRN
  Administered 2014-07-24: 10 mL
  Filled 2014-07-24: qty 10

## 2014-07-24 MED ORDER — SODIUM CHLORIDE 0.9 % IV SOLN
Freq: Once | INTRAVENOUS | Status: AC
  Administered 2014-07-24: 08:00:00 via INTRAVENOUS

## 2014-07-24 MED ORDER — HEPARIN SOD (PORK) LOCK FLUSH 100 UNIT/ML IV SOLN
500.0000 [IU] | Freq: Once | INTRAVENOUS | Status: AC | PRN
  Administered 2014-07-24: 500 [IU]
  Filled 2014-07-24: qty 5

## 2014-07-24 MED ORDER — PACLITAXEL PROTEIN-BOUND CHEMO INJECTION 100 MG
80.0000 mg/m2 | Freq: Once | INTRAVENOUS | Status: AC
  Administered 2014-07-24: 150 mg via INTRAVENOUS
  Filled 2014-07-24: qty 30

## 2014-07-24 NOTE — Patient Instructions (Signed)
[facility] Discharge Instructions for Patients Receiving Chemotherapy  Today you received the following chemotherapy agents: Abraxane   To help prevent nausea and vomiting after your treatment, we encourage you to take your nausea medication as directed.    If you develop nausea and vomiting that is not controlled by your nausea medication, call the clinic.   BELOW ARE SYMPTOMS THAT SHOULD BE REPORTED IMMEDIATELY:  *FEVER GREATER THAN 100.5 F  *CHILLS WITH OR WITHOUT FEVER  NAUSEA AND VOMITING THAT IS NOT CONTROLLED WITH YOUR NAUSEA MEDICATION  *UNUSUAL SHORTNESS OF BREATH  *UNUSUAL BRUISING OR BLEEDING  TENDERNESS IN MOUTH AND THROAT WITH OR WITHOUT PRESENCE OF ULCERS  *URINARY PROBLEMS  *BOWEL PROBLEMS  UNUSUAL RASH Items with * indicate a potential emergency and should be followed up as soon as possible.  Feel free to call the clinic you have any questions or concerns. The clinic phone number is [phone].  Please show the CHEMO ALERT CARD at check-in to the Emergency Department and triage nurse.   

## 2014-07-30 NOTE — Assessment & Plan Note (Signed)
Left breast invasive ductal carcinoma grade 1; 4.8 cm with low-grade DCIS; margins negative 1/4 lymph nodes positive, ER 100% PR 100% HER-2 negative ratio 1.18 Ki-67 26% and 19% T2 N1 stage IIB  Treatment plan:  1. Systemic chemotherapy with dose dense Adriamycin and Cytoxan 4 followed by Abraxane weekly 12 2. Followed by adjuvant radiation 3. Followed by adjuvant antiestrogen therapy with anastrozole for 5 years  Current treatment: Cycle 10/12 Abraxane  Chemotherapy toxicities: 1. Profound fatigue for 2-3 days after chemotherapy starting day 4 2. Grade 4 neutropenia: decreased the dosage of cycle 2 chemotherapy of Adriamycin 50 mg/m and Cytoxan 500 mg/m 3. Thrombocytopenia grade 1 4. Ocular migraine after cycle 3 Adriamycin and Cytoxan 5. Profound leukocytosis white count 111,000 due to Neulasta 6. Nail dyscrasia: keep nails clean and trimmed.  7. Bloody nasal discharge: likely from dry air. Encouraged to use saline nasal spray for relief   I reviewed her blood work from today and they're adequate for treatment.  Closely monitoring for chemotherapy toxicities. Return to clinic in 2 weeks for 12th cycle of Abraxane.

## 2014-07-31 ENCOUNTER — Encounter: Payer: Self-pay | Admitting: *Deleted

## 2014-07-31 ENCOUNTER — Ambulatory Visit (HOSPITAL_BASED_OUTPATIENT_CLINIC_OR_DEPARTMENT_OTHER): Payer: 59 | Admitting: Hematology and Oncology

## 2014-07-31 ENCOUNTER — Ambulatory Visit (HOSPITAL_BASED_OUTPATIENT_CLINIC_OR_DEPARTMENT_OTHER): Payer: 59

## 2014-07-31 ENCOUNTER — Encounter: Payer: Self-pay | Admitting: Hematology and Oncology

## 2014-07-31 ENCOUNTER — Other Ambulatory Visit (HOSPITAL_BASED_OUTPATIENT_CLINIC_OR_DEPARTMENT_OTHER): Payer: 59

## 2014-07-31 VITALS — BP 115/69 | HR 80 | Temp 98.7°F | Resp 18 | Ht 65.0 in | Wt 158.1 lb

## 2014-07-31 DIAGNOSIS — C773 Secondary and unspecified malignant neoplasm of axilla and upper limb lymph nodes: Secondary | ICD-10-CM | POA: Diagnosis not present

## 2014-07-31 DIAGNOSIS — J3489 Other specified disorders of nose and nasal sinuses: Secondary | ICD-10-CM

## 2014-07-31 DIAGNOSIS — Z5111 Encounter for antineoplastic chemotherapy: Secondary | ICD-10-CM

## 2014-07-31 DIAGNOSIS — D696 Thrombocytopenia, unspecified: Secondary | ICD-10-CM | POA: Diagnosis not present

## 2014-07-31 DIAGNOSIS — C50412 Malignant neoplasm of upper-outer quadrant of left female breast: Secondary | ICD-10-CM

## 2014-07-31 DIAGNOSIS — D709 Neutropenia, unspecified: Secondary | ICD-10-CM | POA: Diagnosis not present

## 2014-07-31 DIAGNOSIS — D72829 Elevated white blood cell count, unspecified: Secondary | ICD-10-CM

## 2014-07-31 LAB — CBC WITH DIFFERENTIAL/PLATELET
BASO%: 1.1 % (ref 0.0–2.0)
Basophils Absolute: 0 10*3/uL (ref 0.0–0.1)
EOS ABS: 0 10*3/uL (ref 0.0–0.5)
EOS%: 0.8 % (ref 0.0–7.0)
HCT: 34.2 % — ABNORMAL LOW (ref 34.8–46.6)
HEMOGLOBIN: 11.3 g/dL — AB (ref 11.6–15.9)
LYMPH%: 33 % (ref 14.0–49.7)
MCH: 31.6 pg (ref 25.1–34.0)
MCHC: 33.1 g/dL (ref 31.5–36.0)
MCV: 95.2 fL (ref 79.5–101.0)
MONO#: 0.4 10*3/uL (ref 0.1–0.9)
MONO%: 13.4 % (ref 0.0–14.0)
NEUT#: 1.7 10*3/uL (ref 1.5–6.5)
NEUT%: 51.7 % (ref 38.4–76.8)
Platelets: 318 10*3/uL (ref 145–400)
RBC: 3.59 10*6/uL — AB (ref 3.70–5.45)
RDW: 14.2 % (ref 11.2–14.5)
WBC: 3.3 10*3/uL — ABNORMAL LOW (ref 3.9–10.3)
lymph#: 1.1 10*3/uL (ref 0.9–3.3)

## 2014-07-31 LAB — COMPREHENSIVE METABOLIC PANEL (CC13)
ALBUMIN: 3.6 g/dL (ref 3.5–5.0)
ALT: 25 U/L (ref 0–55)
AST: 21 U/L (ref 5–34)
Alkaline Phosphatase: 56 U/L (ref 40–150)
Anion Gap: 10 mEq/L (ref 3–11)
BILIRUBIN TOTAL: 0.38 mg/dL (ref 0.20–1.20)
BUN: 6.4 mg/dL — ABNORMAL LOW (ref 7.0–26.0)
CO2: 25 meq/L (ref 22–29)
Calcium: 9.5 mg/dL (ref 8.4–10.4)
Chloride: 107 mEq/L (ref 98–109)
Creatinine: 0.7 mg/dL (ref 0.6–1.1)
EGFR: 87 mL/min/{1.73_m2} — ABNORMAL LOW (ref >90)
Glucose: 75 mg/dl (ref 70–140)
POTASSIUM: 4.6 meq/L (ref 3.5–5.1)
SODIUM: 142 meq/L (ref 136–145)
TOTAL PROTEIN: 5.8 g/dL — AB (ref 6.4–8.3)

## 2014-07-31 MED ORDER — SODIUM CHLORIDE 0.9 % IV SOLN
Freq: Once | INTRAVENOUS | Status: AC
  Administered 2014-07-31: 10:00:00 via INTRAVENOUS
  Filled 2014-07-31: qty 4

## 2014-07-31 MED ORDER — HEPARIN SOD (PORK) LOCK FLUSH 100 UNIT/ML IV SOLN
500.0000 [IU] | Freq: Once | INTRAVENOUS | Status: AC | PRN
  Administered 2014-07-31: 500 [IU]
  Filled 2014-07-31: qty 5

## 2014-07-31 MED ORDER — SODIUM CHLORIDE 0.9 % IV SOLN
Freq: Once | INTRAVENOUS | Status: AC
  Administered 2014-07-31: 10:00:00 via INTRAVENOUS

## 2014-07-31 MED ORDER — PACLITAXEL PROTEIN-BOUND CHEMO INJECTION 100 MG
80.0000 mg/m2 | Freq: Once | INTRAVENOUS | Status: AC
  Administered 2014-07-31: 150 mg via INTRAVENOUS
  Filled 2014-07-31: qty 30

## 2014-07-31 MED ORDER — SODIUM CHLORIDE 0.9 % IJ SOLN
10.0000 mL | INTRAMUSCULAR | Status: DC | PRN
  Administered 2014-07-31: 10 mL
  Filled 2014-07-31: qty 10

## 2014-07-31 NOTE — Patient Instructions (Signed)
[facility] Discharge Instructions for Patients Receiving Chemotherapy  Today you received the following chemotherapy agents: abraxane  To help prevent nausea and vomiting after your treatment, we encourage you to take your nausea medication.  Take it as often as prescribed.     If you develop nausea and vomiting that is not controlled by your nausea medication, call the clinic. If it is after clinic hours your family physician or the after hours number for the clinic or go to the Emergency Department.   BELOW ARE SYMPTOMS THAT SHOULD BE REPORTED IMMEDIATELY:  *FEVER GREATER THAN 100.5 F  *CHILLS WITH OR WITHOUT FEVER  NAUSEA AND VOMITING THAT IS NOT CONTROLLED WITH YOUR NAUSEA MEDICATION  *UNUSUAL SHORTNESS OF BREATH  *UNUSUAL BRUISING OR BLEEDING  TENDERNESS IN MOUTH AND THROAT WITH OR WITHOUT PRESENCE OF ULCERS  *URINARY PROBLEMS  *BOWEL PROBLEMS  UNUSUAL RASH Items with * indicate a potential emergency and should be followed up as soon as possible.  Feel free to call the clinic you have any questions or concerns. The clinic phone number is [phone].   I have been informed and understand all the instructions given to me. I know to contact the clinic, my physician, or go to the Emergency Department if any problems should occur. I do not have any questions at this time, but understand that I may call the clinic during office hours   should I have any questions or need assistance in obtaining follow up care.    __________________________________________  _____________  __________ Signature of Patient or Authorized Representative            Date                   Time    __________________________________________ Nurse's Signature   

## 2014-07-31 NOTE — Progress Notes (Signed)
Patient Care Team: Antony Contras, MD as PCP - General (Family Medicine) Elsie Stain, MD (Pulmonary Disease) Rolm Bookbinder, MD as Consulting Physician (General Surgery) Nicholas Lose, MD as Consulting Physician (Hematology and Oncology) [name], MD as Consulting Physician (Radiation Oncology) Rockwell Germany, RN as Registered Nurse Mauro Kaufmann, RN as Registered Nurse Holley Bouche, NP as Nurse Practitioner (Nurse Practitioner)  DIAGNOSIS: Breast cancer of upper-outer quadrant of left female breast   Staging form: Breast, AJCC 7th Edition     Clinical stage from 02/13/2014: Stage IIB (T3, N0, M0) - Unsigned       Staging comments: Staged at breast conference on 1.20.16      Pathologic stage from 03/11/2014: Stage IIB (T2, N1a, cM0) - Signed by Seward Grater, MD on 03/19/2014       Staging comments: Staged on final mastectomy specimen by Dr. Avis Epley    SUMMARY OF ONCOLOGIC HISTORY:   Breast cancer of upper-outer quadrant of left female breast   02/04/2014 Initial Diagnosis left breast: Invasive ductal carcinoma with DCIS ER 100%, PR 100%, HER-2 negative, Ki-67 ranged from 19-26%; third biopsy showed invasive mammary cancer with lobular features and intraductal papilloma, ER/PR positive HER-2 negative   02/11/2014 Breast MRI left breast: 3 masses that are confluent measuring 5.6 x 2.7 x 3 cm extends to the left aspect of area along the penis to involve skin of the area no lymph nodes detected right breast negative   03/07/2014 Surgery Left breast invasive ductal carcinoma grade 1; 4.8 cm with low-grade DCIS; margins negative 1/4 lymph nodes positive, ER 100% PR 100% HER-2 negative ratio 1.18 Ki-67 26% and 19% T2 N1 stage IIB   04/03/2014 -  Chemotherapy Adjuvant chemotherapy with dose dense Adriamycin and Cytoxan followed by weekly Abraxane 12    CHIEF COMPLIANT: Week 10/12 Abraxane    INTERVAL HISTORY: Theresa Huff is a cyst in one year with above-mentioned history of  left breast cancer currently on adjuvant chemotherapy she is on week 10 of Abraxane. She denies any neuropathy. She does have some fatigue especially on the weekends. She is otherwise handing the treatment extremely well. Denies any nausea or vomiting. Does not have taste yet.  REVIEW OF SYSTEMS:   Constitutional: Denies fevers, chills or abnormal weight loss Eyes: Denies blurriness of vision Ears, nose, mouth, throat, and face: Denies mucositis or sore throat Respiratory: Denies cough, dyspnea or wheezes Cardiovascular: Denies palpitation, chest discomfort or lower extremity swelling Gastrointestinal:  Denies nausea, heartburn or change in bowel habits Skin: Denies abnormal skin rashes Lymphatics: Denies new lymphadenopathy or easy bruising Neurological:Denies numbness, tingling or new weaknesses Behavioral/Psych: Mood is stable, no new changes  All other systems were reviewed with the patient and are negative.  I have reviewed the past medical history, past surgical history, social history and family history with the patient and they are unchanged from previous note.  ALLERGIES:  is allergic to hydrocodone; codeine; iodine; primidone; sulfa antibiotics; and tape.  MEDICATIONS:  Current Outpatient Prescriptions  Medication Sig Dispense Refill  . calcium carbonate (OS-CAL) 600 MG TABS tablet Take 600 mg by mouth daily.    . cetirizine (ZYRTEC) 10 MG tablet Take 10 mg by mouth daily.      . Cholecalciferol (VITAMIN D PO) Take 2,000 Units by mouth daily.    Marland Kitchen dexamethasone (DECADRON) 4 MG tablet   1  . DULoxetine (CYMBALTA) 30 MG capsule Take 30 mg by mouth daily.     Marland Kitchen esomeprazole (  NEXIUM) 20 MG packet Take 20 mg by mouth daily before breakfast.    . Eszopiclone (ESZOPICLONE) 3 MG TABS Take 3 mg by mouth at bedtime. Take immediately before bedtime    . lidocaine-prilocaine (EMLA) cream     . LORazepam (ATIVAN) 0.5 MG tablet   0  . mometasone (NASONEX) 50 MCG/ACT nasal spray Place 2  sprays into the nose daily.      . ondansetron (ZOFRAN) 8 MG tablet   1  . oxyCODONE (OXY IR/ROXICODONE) 5 MG immediate release tablet Take 1-2 tablets (5-10 mg total) by mouth every 4 (four) hours as needed for moderate pain. 30 tablet 0  . prochlorperazine (COMPAZINE) 10 MG tablet   1  . SUMAtriptan (IMITREX) 50 MG tablet Take 1 tablet (50 mg total) by mouth every 2 (two) hours as needed for migraine (Max of 2 tabs per 24 hour period.). 10 tablet 0  . triamcinolone cream (KENALOG) 0.1 %     . Vitamin D, Ergocalciferol, (DRISDOL) 50000 UNITS CAPS capsule Take 50,000 Units by mouth every 7 (seven) days.     No current facility-administered medications for this visit.    PHYSICAL EXAMINATION: ECOG PERFORMANCE STATUS: 1 - Symptomatic but completely ambulatory  Filed Vitals:   07/31/14 0816  BP: 115/69  Pulse: 80  Temp: 98.7 F (37.1 C)  Resp: 18   Filed Weights   07/31/14 0816  Weight: 158 lb 1.6 oz (71.714 kg)    GENERAL:alert, no distress and comfortable SKIN: skin color, texture, turgor are normal, no rashes or significant lesions EYES: normal, Conjunctiva are pink and non-injected, sclera clear OROPHARYNX:no exudate, no erythema and lips, buccal mucosa, and tongue normal  NECK: supple, thyroid normal size, non-tender, without nodularity LYMPH:  no palpable lymphadenopathy in the cervical, axillary or inguinal LUNGS: clear to auscultation and percussion with normal breathing effort HEART: regular rate & rhythm and no murmurs and no lower extremity edema ABDOMEN:abdomen soft, non-tender and normal bowel sounds Musculoskeletal:no cyanosis of digits and no clubbing  NEURO: alert & oriented x 3 with fluent speech, no focal motor/sensory deficits   LABORATORY DATA:  I have reviewed the data as listed   Chemistry      Component Value Date/Time   NA 141 07/24/2014 0746   NA 139 05/03/2014 1946   K 4.5 07/24/2014 0746   K 4.1 05/03/2014 1946   CL 103 05/03/2014 1946   CO2  26 07/24/2014 0746   CO2 26 05/03/2014 1946   BUN 8.9 07/24/2014 0746   BUN 17 05/03/2014 1946   CREATININE 0.8 07/24/2014 0746   CREATININE 0.77 05/03/2014 1946      Component Value Date/Time   CALCIUM 9.3 07/24/2014 0746   CALCIUM 9.6 05/03/2014 1946   ALKPHOS 63 07/24/2014 0746   ALKPHOS 76 09/14/2010 1708   AST 18 07/24/2014 0746   AST 18 09/14/2010 1708   ALT 24 07/24/2014 0746   ALT 21 09/14/2010 1708   BILITOT 0.58 07/24/2014 0746   BILITOT 0.4 09/14/2010 1708       Lab Results  Component Value Date   WBC 3.3* 07/31/2014   HGB 11.3* 07/31/2014   HCT 34.2* 07/31/2014   MCV 95.2 07/31/2014   PLT 318 07/31/2014   NEUTROABS 1.7 07/31/2014   ASSESSMENT & PLAN:  Breast cancer of upper-outer quadrant of left female breast Left breast invasive ductal carcinoma grade 1; 4.8 cm with low-grade DCIS; margins negative 1/4 lymph nodes positive, ER 100% PR 100% HER-2 negative ratio 1.18  Ki-67 26% and 19% T2 N1 stage IIB  Treatment plan:  1. Systemic chemotherapy with dose dense Adriamycin and Cytoxan 4 followed by Abraxane weekly 12 2. Followed by adjuvant radiation 3. Followed by adjuvant antiestrogen therapy with anastrozole for 5 years  Current treatment: Cycle 10/12 Abraxane  Chemotherapy toxicities: 1. Profound fatigue for 2-3 days after chemotherapy starting day 4 2. Grade 4 neutropenia: decreased the dosage of cycle 2 chemotherapy of Adriamycin 50 mg/m and Cytoxan 500 mg/m 3. Thrombocytopenia grade 1 4. Ocular migraine after cycle 3 Adriamycin and Cytoxan 5. Profound leukocytosis white count 111,000 due to Neulasta 6. Nail changes: keep nails clean and trimmed.  7. Bloody nasal discharge: likely from dry air. Encouraged to use saline nasal spray for relief  8. Fatigue from chemotherapy  I reviewed her blood work from today and they're adequate for treatment.  Closely monitoring for chemotherapy toxicities. Return to clinic in 2 weeks for 12th cycle of  Abraxane. I sent a message to Dr. [name] to see her for follow-up after chemotherapy and to initiate adjuvant radiation therapy. Patient's husband is at work in the [place] and she would like to finish radiation so she can join him for a vacation.   No orders of the defined types were placed in this encounter.   The patient has a good understanding of the overall plan. she agrees with it. she will call with any problems that may develop before the next visit here.   Rulon Eisenmenger, MD

## 2014-08-02 ENCOUNTER — Encounter: Payer: Self-pay | Admitting: Hematology and Oncology

## 2014-08-02 ENCOUNTER — Telehealth: Payer: Self-pay | Admitting: *Deleted

## 2014-08-02 NOTE — Telephone Encounter (Signed)
Error

## 2014-08-07 ENCOUNTER — Other Ambulatory Visit (HOSPITAL_BASED_OUTPATIENT_CLINIC_OR_DEPARTMENT_OTHER): Payer: 59

## 2014-08-07 ENCOUNTER — Ambulatory Visit (HOSPITAL_BASED_OUTPATIENT_CLINIC_OR_DEPARTMENT_OTHER): Payer: 59

## 2014-08-07 ENCOUNTER — Encounter: Payer: Self-pay | Admitting: *Deleted

## 2014-08-07 VITALS — BP 104/45 | HR 83 | Temp 98.1°F | Resp 18

## 2014-08-07 DIAGNOSIS — C773 Secondary and unspecified malignant neoplasm of axilla and upper limb lymph nodes: Secondary | ICD-10-CM | POA: Diagnosis not present

## 2014-08-07 DIAGNOSIS — C50412 Malignant neoplasm of upper-outer quadrant of left female breast: Secondary | ICD-10-CM

## 2014-08-07 DIAGNOSIS — Z5111 Encounter for antineoplastic chemotherapy: Secondary | ICD-10-CM

## 2014-08-07 LAB — CBC WITH DIFFERENTIAL/PLATELET
BASO%: 1.1 % (ref 0.0–2.0)
Basophils Absolute: 0 10*3/uL (ref 0.0–0.1)
EOS%: 2.3 % (ref 0.0–7.0)
Eosinophils Absolute: 0.1 10*3/uL (ref 0.0–0.5)
HCT: 34.6 % — ABNORMAL LOW (ref 34.8–46.6)
HEMOGLOBIN: 11.3 g/dL — AB (ref 11.6–15.9)
LYMPH%: 33 % (ref 14.0–49.7)
MCH: 31.3 pg (ref 25.1–34.0)
MCHC: 32.8 g/dL (ref 31.5–36.0)
MCV: 95.5 fL (ref 79.5–101.0)
MONO#: 0.5 10*3/uL (ref 0.1–0.9)
MONO%: 14.3 % — AB (ref 0.0–14.0)
NEUT#: 1.6 10*3/uL (ref 1.5–6.5)
NEUT%: 49.3 % (ref 38.4–76.8)
Platelets: 296 10*3/uL (ref 145–400)
RBC: 3.62 10*6/uL — ABNORMAL LOW (ref 3.70–5.45)
RDW: 14.1 % (ref 11.2–14.5)
WBC: 3.2 10*3/uL — AB (ref 3.9–10.3)
lymph#: 1.1 10*3/uL (ref 0.9–3.3)

## 2014-08-07 LAB — COMPREHENSIVE METABOLIC PANEL (CC13)
ALBUMIN: 3.5 g/dL (ref 3.5–5.0)
ALT: 23 U/L (ref 0–55)
AST: 19 U/L (ref 5–34)
Alkaline Phosphatase: 58 U/L (ref 40–150)
Anion Gap: 8 mEq/L (ref 3–11)
BUN: 5.5 mg/dL — ABNORMAL LOW (ref 7.0–26.0)
CHLORIDE: 107 meq/L (ref 98–109)
CO2: 28 mEq/L (ref 22–29)
Calcium: 9.5 mg/dL (ref 8.4–10.4)
Creatinine: 0.8 mg/dL (ref 0.6–1.1)
EGFR: 86 mL/min/{1.73_m2} — ABNORMAL LOW (ref >90)
GLUCOSE: 75 mg/dL (ref 70–140)
POTASSIUM: 5 meq/L (ref 3.5–5.1)
Sodium: 142 mEq/L (ref 136–145)
Total Bilirubin: 0.34 mg/dL (ref 0.20–1.20)
Total Protein: 5.8 g/dL — ABNORMAL LOW (ref 6.4–8.3)

## 2014-08-07 MED ORDER — SODIUM CHLORIDE 0.9 % IV SOLN
Freq: Once | INTRAVENOUS | Status: AC
  Administered 2014-08-07: 09:00:00 via INTRAVENOUS

## 2014-08-07 MED ORDER — PACLITAXEL PROTEIN-BOUND CHEMO INJECTION 100 MG
80.0000 mg/m2 | Freq: Once | INTRAVENOUS | Status: AC
  Administered 2014-08-07: 150 mg via INTRAVENOUS
  Filled 2014-08-07: qty 30

## 2014-08-07 MED ORDER — HEPARIN SOD (PORK) LOCK FLUSH 100 UNIT/ML IV SOLN
500.0000 [IU] | Freq: Once | INTRAVENOUS | Status: AC | PRN
  Administered 2014-08-07: 500 [IU]
  Filled 2014-08-07: qty 5

## 2014-08-07 MED ORDER — SODIUM CHLORIDE 0.9 % IV SOLN
Freq: Once | INTRAVENOUS | Status: AC
  Administered 2014-08-07: 09:00:00 via INTRAVENOUS
  Filled 2014-08-07: qty 4

## 2014-08-07 MED ORDER — SODIUM CHLORIDE 0.9 % IJ SOLN
10.0000 mL | INTRAMUSCULAR | Status: DC | PRN
  Administered 2014-08-07: 10 mL
  Filled 2014-08-07: qty 10

## 2014-08-07 NOTE — Patient Instructions (Signed)
[facility] Discharge Instructions for Patients Receiving Chemotherapy  Today you received the following chemotherapy agents abraxane  To help prevent nausea and vomiting after your treatment, we encourage you to take your nausea medication as directed   If you develop nausea and vomiting that is not controlled by your nausea medication, call the clinic.   BELOW ARE SYMPTOMS THAT SHOULD BE REPORTED IMMEDIATELY:  *FEVER GREATER THAN 100.5 F  *CHILLS WITH OR WITHOUT FEVER  NAUSEA AND VOMITING THAT IS NOT CONTROLLED WITH YOUR NAUSEA MEDICATION  *UNUSUAL SHORTNESS OF BREATH  *UNUSUAL BRUISING OR BLEEDING  TENDERNESS IN MOUTH AND THROAT WITH OR WITHOUT PRESENCE OF ULCERS  *URINARY PROBLEMS  *BOWEL PROBLEMS  UNUSUAL RASH Items with * indicate a potential emergency and should be followed up as soon as possible.  Feel free to call the clinic you have any questions or concerns. The clinic phone number is [phone].  Please show the [facility] at check-in to the Emergency Department and triage nurse.  Nanoparticle Albumin-Bound Paclitaxel injection What is this medicine? NANOPARTICLE ALBUMIN-BOUND PACLITAXEL (Na no PAHR ti kuhl al BYOO muhn-bound PAK li TAX el) is a chemotherapy drug. It targets fast dividing cells, like cancer cells, and causes these cells to die. This medicine is used to treat advanced breast cancer and advanced lung cancer. This medicine may be used for other purposes; ask your health care provider or pharmacist if you have questions. COMMON BRAND NAME(S): Abraxane What should I tell my health care provider before I take this medicine? They need to know if you have any of these conditions: -kidney disease -liver disease -low blood counts, like low platelets, red blood cells, or white blood cells -recent or ongoing radiation therapy -an unusual or allergic reaction to paclitaxel, albumin, other chemotherapy, other medicines, foods, dyes,  or preservatives -pregnant or trying to get pregnant -breast-feeding How should I use this medicine? This drug is given as an infusion into a vein. It is administered in a hospital or clinic by a specially trained health care professional. Talk to your pediatrician regarding the use of this medicine in children. Special care may be needed. Overdosage: If you think you have taken too much of this medicine contact a poison control center or emergency room at once. NOTE: This medicine is only for you. Do not share this medicine with others. What if I miss a dose? It is important not to miss your dose. Call your doctor or health care professional if you are unable to keep an appointment. What may interact with this medicine? -cyclosporine -diazepam -ketoconazole -medicines to increase blood counts like filgrastim, pegfilgrastim, sargramostim -other chemotherapy drugs like cisplatin, doxorubicin, epirubicin, etoposide, teniposide, vincristine -quinidine -testosterone -vaccines -verapamil Talk to your doctor or health care professional before taking any of these medicines: -acetaminophen -aspirin -ibuprofen -ketoprofen -naproxen This list may not describe all possible interactions. Give your health care provider a list of all the medicines, herbs, non-prescription drugs, or dietary supplements you use. Also tell them if you smoke, drink alcohol, or use illegal drugs. Some items may interact with your medicine. What should I watch for while using this medicine? Your condition will be monitored carefully while you are receiving this medicine. You will need important blood work done while you are taking this medicine. This drug may make you feel generally unwell. This is not uncommon, as chemotherapy can affect healthy cells as well as cancer cells. Report any side effects. Continue your course of treatment even  though you feel ill unless your doctor tells you to stop. In some cases, you may be  given additional medicines to help with side effects. Follow all directions for their use. Call your doctor or health care professional for advice if you get a fever, chills or sore throat, or other symptoms of a cold or flu. Do not treat yourself. This drug decreases your body's ability to fight infections. Try to avoid being around people who are sick. This medicine may increase your risk to bruise or bleed. Call your doctor or health care professional if you notice any unusual bleeding. Be careful brushing and flossing your teeth or using a toothpick because you may get an infection or bleed more easily. If you have any dental work done, tell your dentist you are receiving this medicine. Avoid taking products that contain aspirin, acetaminophen, ibuprofen, naproxen, or ketoprofen unless instructed by your doctor. These medicines may hide a fever. Do not become pregnant while taking this medicine. Women should inform their doctor if they wish to become pregnant or think they might be pregnant. There is a potential for serious side effects to an unborn child. Talk to your health care professional or pharmacist for more information. Do not breast-feed an infant while taking this medicine. Men are advised not to father a child while receiving this medicine. What side effects may I notice from receiving this medicine? Side effects that you should report to your doctor or health care professional as soon as possible: -allergic reactions like skin rash, itching or hives, swelling of the face, lips, or tongue -low blood counts - This drug may decrease the number of white blood cells, red blood cells and platelets. You may be at increased risk for infections and bleeding. -signs of infection - fever or chills, cough, sore throat, pain or difficulty passing urine -signs of decreased platelets or bleeding - bruising, pinpoint red spots on the skin, black, tarry stools, nosebleeds -signs of decreased red blood  cells - unusually weak or tired, fainting spells, lightheadedness -breathing problems -changes in vision -chest pain -high or low blood pressure -mouth sores -nausea and vomiting -pain, swelling, redness or irritation at the injection site -pain, tingling, numbness in the hands or feet -slow or irregular heartbeat -swelling of the ankle, feet, hands Side effects that usually do not require medical attention (report to your doctor or health care professional if they continue or are bothersome): -aches, pains -changes in the color of fingernails -diarrhea -hair loss -loss of appetite This list may not describe all possible side effects. Call your doctor for medical advice about side effects. You may report side effects to FDA at 1-800-FDA-1088. Where should I keep my medicine? This drug is given in a hospital or clinic and will not be stored at home. NOTE: This sheet is a summary. It may not cover all possible information. If you have questions about this medicine, talk to your doctor, pharmacist, or health care provider.  2015, [employer] Standard. (2012-03-06 16:48:50)

## 2014-08-13 NOTE — Assessment & Plan Note (Signed)
Left breast invasive ductal carcinoma grade 1; 4.8 cm with low-grade DCIS; margins negative 1/4 lymph nodes positive, ER 100% PR 100% HER-2 negative ratio 1.18 Ki-67 26% and 19% T2 N1 stage IIB  Treatment plan:  1. Systemic chemotherapy with dose dense Adriamycin and Cytoxan 4 followed by Abraxane weekly 12 2. Followed by adjuvant radiation 3. Followed by adjuvant antiestrogen therapy with anastrozole for 5 years  Current treatment: Cycle 12/12 Abraxane  Chemotherapy toxicities: 1. Profound fatigue for 2-3 days after chemotherapy starting day 4 2. Grade 4 neutropenia: decreased the dosage of cycle 2 chemotherapy of Adriamycin 50 mg/m and Cytoxan 500 mg/m 3. Thrombocytopenia grade 1 4. Ocular migraine after cycle 3 Adriamycin and Cytoxan 5. Profound leukocytosis white count 111,000 due to Neulasta 6. Nail changes: keep nails clean and trimmed.  7. Bloody nasal discharge: likely from dry air. Encouraged to use saline nasal spray for relief  8. Fatigue from chemotherapy  I reviewed her blood work from today and they're adequate for treatment. Closely monitoring for chemotherapy toxicities.  Plan: 1. Adjuvant XRT 2. Followed by adjuvant Anti-estrogen therapy Patient will be going to [place] to join her husband after XRT 

## 2014-08-14 ENCOUNTER — Ambulatory Visit (HOSPITAL_BASED_OUTPATIENT_CLINIC_OR_DEPARTMENT_OTHER): Payer: 59 | Admitting: Hematology and Oncology

## 2014-08-14 ENCOUNTER — Encounter: Payer: Self-pay | Admitting: Hematology and Oncology

## 2014-08-14 ENCOUNTER — Other Ambulatory Visit (HOSPITAL_BASED_OUTPATIENT_CLINIC_OR_DEPARTMENT_OTHER): Payer: 59

## 2014-08-14 ENCOUNTER — Encounter: Payer: Self-pay | Admitting: *Deleted

## 2014-08-14 ENCOUNTER — Telehealth: Payer: Self-pay | Admitting: Hematology and Oncology

## 2014-08-14 ENCOUNTER — Ambulatory Visit (HOSPITAL_BASED_OUTPATIENT_CLINIC_OR_DEPARTMENT_OTHER): Payer: 59

## 2014-08-14 VITALS — BP 118/70 | HR 89 | Temp 98.5°F | Resp 18 | Ht 65.0 in | Wt 160.0 lb

## 2014-08-14 DIAGNOSIS — D6959 Other secondary thrombocytopenia: Secondary | ICD-10-CM

## 2014-08-14 DIAGNOSIS — D72829 Elevated white blood cell count, unspecified: Secondary | ICD-10-CM

## 2014-08-14 DIAGNOSIS — C50412 Malignant neoplasm of upper-outer quadrant of left female breast: Secondary | ICD-10-CM | POA: Diagnosis not present

## 2014-08-14 DIAGNOSIS — C773 Secondary and unspecified malignant neoplasm of axilla and upper limb lymph nodes: Secondary | ICD-10-CM

## 2014-08-14 DIAGNOSIS — J3489 Other specified disorders of nose and nasal sinuses: Secondary | ICD-10-CM

## 2014-08-14 DIAGNOSIS — R5383 Other fatigue: Secondary | ICD-10-CM

## 2014-08-14 DIAGNOSIS — D701 Agranulocytosis secondary to cancer chemotherapy: Secondary | ICD-10-CM | POA: Diagnosis not present

## 2014-08-14 DIAGNOSIS — Z5111 Encounter for antineoplastic chemotherapy: Secondary | ICD-10-CM | POA: Diagnosis not present

## 2014-08-14 LAB — COMPREHENSIVE METABOLIC PANEL (CC13)
ALK PHOS: 55 U/L (ref 40–150)
ALT: 24 U/L (ref 0–55)
AST: 23 U/L (ref 5–34)
Albumin: 3.5 g/dL (ref 3.5–5.0)
Anion Gap: 8 mEq/L (ref 3–11)
BUN: 7.3 mg/dL (ref 7.0–26.0)
CALCIUM: 9.4 mg/dL (ref 8.4–10.4)
CO2: 25 mEq/L (ref 22–29)
Chloride: 109 mEq/L (ref 98–109)
Creatinine: 0.7 mg/dL (ref 0.6–1.1)
EGFR: 90 mL/min/{1.73_m2} — ABNORMAL LOW (ref >90)
GLUCOSE: 92 mg/dL (ref 70–140)
Potassium: 4.2 mEq/L (ref 3.5–5.1)
Sodium: 141 mEq/L (ref 136–145)
TOTAL PROTEIN: 5.7 g/dL — AB (ref 6.4–8.3)
Total Bilirubin: 0.45 mg/dL (ref 0.20–1.20)

## 2014-08-14 LAB — CBC WITH DIFFERENTIAL/PLATELET
BASO%: 1.2 % (ref 0.0–2.0)
Basophils Absolute: 0 10*3/uL (ref 0.0–0.1)
EOS ABS: 0.1 10*3/uL (ref 0.0–0.5)
EOS%: 2.7 % (ref 0.0–7.0)
HCT: 34.6 % — ABNORMAL LOW (ref 34.8–46.6)
HEMOGLOBIN: 11.2 g/dL — AB (ref 11.6–15.9)
LYMPH%: 36.6 % (ref 14.0–49.7)
MCH: 30.4 pg (ref 25.1–34.0)
MCHC: 32.2 g/dL (ref 31.5–36.0)
MCV: 94.3 fL (ref 79.5–101.0)
MONO#: 0.4 10*3/uL (ref 0.1–0.9)
MONO%: 13.6 % (ref 0.0–14.0)
NEUT%: 45.9 % (ref 38.4–76.8)
NEUTROS ABS: 1.4 10*3/uL — AB (ref 1.5–6.5)
PLATELETS: 293 10*3/uL (ref 145–400)
RBC: 3.67 10*6/uL — ABNORMAL LOW (ref 3.70–5.45)
RDW: 14 % (ref 11.2–14.5)
WBC: 3 10*3/uL — ABNORMAL LOW (ref 3.9–10.3)
lymph#: 1.1 10*3/uL (ref 0.9–3.3)

## 2014-08-14 MED ORDER — HEPARIN SOD (PORK) LOCK FLUSH 100 UNIT/ML IV SOLN
500.0000 [IU] | Freq: Once | INTRAVENOUS | Status: AC | PRN
  Administered 2014-08-14: 500 [IU]
  Filled 2014-08-14: qty 5

## 2014-08-14 MED ORDER — SODIUM CHLORIDE 0.9 % IV SOLN
Freq: Once | INTRAVENOUS | Status: AC
  Administered 2014-08-14: 09:00:00 via INTRAVENOUS

## 2014-08-14 MED ORDER — SODIUM CHLORIDE 0.9 % IJ SOLN
10.0000 mL | INTRAMUSCULAR | Status: DC | PRN
  Administered 2014-08-14: 10 mL
  Filled 2014-08-14: qty 10

## 2014-08-14 MED ORDER — PACLITAXEL PROTEIN-BOUND CHEMO INJECTION 100 MG
80.0000 mg/m2 | Freq: Once | INTRAVENOUS | Status: AC
  Administered 2014-08-14: 150 mg via INTRAVENOUS
  Filled 2014-08-14: qty 30

## 2014-08-14 MED ORDER — SODIUM CHLORIDE 0.9 % IV SOLN
Freq: Once | INTRAVENOUS | Status: AC
  Administered 2014-08-14: 09:00:00 via INTRAVENOUS
  Filled 2014-08-14: qty 4

## 2014-08-14 NOTE — Telephone Encounter (Signed)
Confirmed appointment for October

## 2014-08-14 NOTE — Progress Notes (Signed)
Patient Care Team: Antony Contras, MD as PCP - General (Family Medicine) Elsie Stain, MD (Pulmonary Disease) Rolm Bookbinder, MD as Consulting Physician (General Surgery) Nicholas Lose, MD as Consulting Physician (Hematology and Oncology) [name], MD as Consulting Physician (Radiation Oncology) Rockwell Germany, RN as Registered Nurse Mauro Kaufmann, RN as Registered Nurse Holley Bouche, NP as Nurse Practitioner (Nurse Practitioner)  DIAGNOSIS: Breast cancer of upper-outer quadrant of left female breast   Staging form: Breast, AJCC 7th Edition     Clinical stage from 02/13/2014: Stage IIB (T3, N0, M0) - Unsigned       Staging comments: Staged at breast conference on 1.20.16      Pathologic stage from 03/11/2014: Stage IIB (T2, N1a, cM0) - Signed by Seward Grater, MD on 03/19/2014       Staging comments: Staged on final mastectomy specimen by Dr. Avis Epley    SUMMARY OF ONCOLOGIC HISTORY:   Breast cancer of upper-outer quadrant of left female breast   02/04/2014 Initial Diagnosis left breast: Invasive ductal carcinoma with DCIS ER 100%, PR 100%, HER-2 negative, Ki-67 ranged from 19-26%; third biopsy showed invasive mammary cancer with lobular features and intraductal papilloma, ER/PR positive HER-2 negative   02/11/2014 Breast MRI left breast: 3 masses that are confluent measuring 5.6 x 2.7 x 3 cm extends to the left aspect of area along the penis to involve skin of the area no lymph nodes detected right breast negative   03/07/2014 Surgery Left breast invasive ductal carcinoma grade 1; 4.8 cm with low-grade DCIS; margins negative 1/4 lymph nodes positive, ER 100% PR 100% HER-2 negative ratio 1.18 Ki-67 26% and 19% T2 N1 stage IIB   04/03/2014 -  Chemotherapy Adjuvant chemotherapy with dose dense Adriamycin and Cytoxan followed by weekly Abraxane 12    CHIEF COMPLIANT: We 12/12 Abraxane last dose of chemotherapy  INTERVAL HISTORY: Theresa Huff is a 64 year old above-mentioned  history of left-sided breast cancer currently on adjuvant chemotherapy and today's last dose of Abraxane. Over the last 2 or 3 weeks she has felt more tired than usual. Especially the weekends she is been sleeping a lot. She is also winded when she walks short distances. She is feeling a lot better today. Denies any fevers or chills. She does notice of tongue is slightly whitish discolored.  REVIEW OF SYSTEMS:   Constitutional: Denies fevers, chills or abnormal weight loss, complaints of fatigue Eyes: Denies blurriness of vision Ears, nose, mouth, throat, and face: Tongue whitish discoloration does not appear to be thrush Respiratory: Denies cough, dyspnea or wheezes Cardiovascular: Denies palpitation, chest discomfort or lower extremity swelling Gastrointestinal:  Denies nausea, heartburn or change in bowel habits Skin: Denies abnormal skin rashes Lymphatics: Denies new lymphadenopathy or easy bruising Neurological:Denies numbness, tingling or new weaknesses Behavioral/Psych: Mood is stable, no new changes  Breast:  denies any pain or lumps or nodules in either breasts All other systems were reviewed with the patient and are negative.  I have reviewed the past medical history, past surgical history, social history and family history with the patient and they are unchanged from previous note.  ALLERGIES:  is allergic to hydrocodone; codeine; iodine; primidone; sulfa antibiotics; and tape.  MEDICATIONS:  Current Outpatient Prescriptions  Medication Sig Dispense Refill  . calcium carbonate (OS-CAL) 600 MG TABS tablet Take 600 mg by mouth daily.    . cetirizine (ZYRTEC) 10 MG tablet Take 10 mg by mouth daily.      . Cholecalciferol (VITAMIN D  PO) Take 2,000 Units by mouth daily.    Marland Kitchen dexamethasone (DECADRON) 4 MG tablet   1  . DULoxetine (CYMBALTA) 30 MG capsule Take 30 mg by mouth daily.     Marland Kitchen esomeprazole (NEXIUM) 20 MG packet Take 20 mg by mouth daily before breakfast.    . Eszopiclone  (ESZOPICLONE) 3 MG TABS Take 3 mg by mouth at bedtime. Take immediately before bedtime    . lidocaine-prilocaine (EMLA) cream     . LORazepam (ATIVAN) 0.5 MG tablet   0  . mometasone (NASONEX) 50 MCG/ACT nasal spray Place 2 sprays into the nose daily.      . ondansetron (ZOFRAN) 8 MG tablet   1  . oxyCODONE (OXY IR/ROXICODONE) 5 MG immediate release tablet Take 1-2 tablets (5-10 mg total) by mouth every 4 (four) hours as needed for moderate pain. 30 tablet 0  . prochlorperazine (COMPAZINE) 10 MG tablet   1  . SUMAtriptan (IMITREX) 50 MG tablet Take 1 tablet (50 mg total) by mouth every 2 (two) hours as needed for migraine (Max of 2 tabs per 24 hour period.). 10 tablet 0  . triamcinolone cream (KENALOG) 0.1 %     . Vitamin D, Ergocalciferol, (DRISDOL) 50000 UNITS CAPS capsule Take 50,000 Units by mouth every 7 (seven) days.     No current facility-administered medications for this visit.    PHYSICAL EXAMINATION: ECOG PERFORMANCE STATUS: 1 - Symptomatic but completely ambulatory  Filed Vitals:   08/14/14 0807  BP: 118/70  Pulse: 89  Temp: 98.5 F (36.9 C)  Resp: 18   Filed Weights   08/14/14 0807  Weight: 160 lb (72.576 kg)    GENERAL:alert, no distress and comfortable SKIN: skin color, texture, turgor are normal, no rashes or significant lesions EYES: normal, Conjunctiva are pink and non-injected, sclera clear OROPHARYNX:no exudate, no erythema and lips, buccal mucosa, and tongue normal  NECK: supple, thyroid normal size, non-tender, without nodularity LYMPH:  no palpable lymphadenopathy in the cervical, axillary or inguinal LUNGS: clear to auscultation and percussion with normal breathing effort HEART: regular rate & rhythm and no murmurs and no lower extremity edema ABDOMEN:abdomen soft, non-tender and normal bowel sounds Musculoskeletal:no cyanosis of digits and no clubbing  NEURO: alert & oriented x 3 with fluent speech, no focal motor/sensory deficits  LABORATORY DATA:   I have reviewed the data as listed   Chemistry      Component Value Date/Time   NA 142 08/07/2014 0805   NA 139 05/03/2014 1946   K 5.0 08/07/2014 0805   K 4.1 05/03/2014 1946   CL 103 05/03/2014 1946   CO2 28 08/07/2014 0805   CO2 26 05/03/2014 1946   BUN 5.5* 08/07/2014 0805   BUN 17 05/03/2014 1946   CREATININE 0.8 08/07/2014 0805   CREATININE 0.77 05/03/2014 1946      Component Value Date/Time   CALCIUM 9.5 08/07/2014 0805   CALCIUM 9.6 05/03/2014 1946   ALKPHOS 58 08/07/2014 0805   ALKPHOS 76 09/14/2010 1708   AST 19 08/07/2014 0805   AST 18 09/14/2010 1708   ALT 23 08/07/2014 0805   ALT 21 09/14/2010 1708   BILITOT 0.34 08/07/2014 0805   BILITOT 0.4 09/14/2010 1708       Lab Results  Component Value Date   WBC 3.0* 08/14/2014   HGB 11.2* 08/14/2014   HCT 34.6* 08/14/2014   MCV 94.3 08/14/2014   PLT 293 08/14/2014   NEUTROABS 1.4* 08/14/2014   ASSESSMENT & PLAN:  Breast cancer of upper-outer quadrant of left female breast Left breast invasive ductal carcinoma grade 1; 4.8 cm with low-grade DCIS; margins negative 1/4 lymph nodes positive, ER 100% PR 100% HER-2 negative ratio 1.18 Ki-67 26% and 19% T2 N1 stage IIB  Treatment plan:  1. Systemic chemotherapy with dose dense Adriamycin and Cytoxan 4 followed by Abraxane weekly 12 2. Followed by adjuvant radiation 3. Followed by adjuvant antiestrogen therapy with anastrozole for 5 years  Current treatment: Cycle 12/12 Abraxane  Chemotherapy toxicities: 1. Profound fatigue for 2-3 days after chemotherapy starting day 4 2. Grade 4 neutropenia: decreased the dosage of cycle 2 chemotherapy of Adriamycin 50 mg/m and Cytoxan 500 mg/m 3. Thrombocytopenia grade 1 4. Ocular migraine after cycle 3 Adriamycin and Cytoxan 5. Profound leukocytosis white count 111,000 due to Neulasta 6. Nail changes: keep nails clean and trimmed.  7. Bloody nasal discharge: likely from dry air. Encouraged to use saline nasal spray  for relief  8. Fatigue from chemotherapy  I reviewed her blood work from today and they're adequate for treatment. Closely monitoring for chemotherapy toxicities.  Plan: 1. Adjuvant XRT 2. Followed by adjuvant Anti-estrogen therapy, which we will start after she returns back from [place] October 31st Patient will be going to [place] to join her husband after XRT   No orders of the defined types were placed in this encounter.   The patient has a good understanding of the overall plan. she agrees with it. she will call with any problems that may develop before the next visit here.   Rulon Eisenmenger, MD

## 2014-08-14 NOTE — Patient Instructions (Signed)
[facility] Discharge Instructions for Patients Receiving Chemotherapy  Today you received the following chemotherapy agents abraxane  To help prevent nausea and vomiting after your treatment, we encourage you to take your nausea medication as directed   If you develop nausea and vomiting that is not controlled by your nausea medication, call the clinic.   BELOW ARE SYMPTOMS THAT SHOULD BE REPORTED IMMEDIATELY:  *FEVER GREATER THAN 100.5 F  *CHILLS WITH OR WITHOUT FEVER  NAUSEA AND VOMITING THAT IS NOT CONTROLLED WITH YOUR NAUSEA MEDICATION  *UNUSUAL SHORTNESS OF BREATH  *UNUSUAL BRUISING OR BLEEDING  TENDERNESS IN MOUTH AND THROAT WITH OR WITHOUT PRESENCE OF ULCERS  *URINARY PROBLEMS  *BOWEL PROBLEMS  UNUSUAL RASH Items with * indicate a potential emergency and should be followed up as soon as possible.  Feel free to call the clinic you have any questions or concerns. The clinic phone number is [phone].  Please show the [facility] at check-in to the Emergency Department and triage nurse.  Nanoparticle Albumin-Bound Paclitaxel injection What is this medicine? NANOPARTICLE ALBUMIN-BOUND PACLITAXEL (Na no PAHR ti kuhl al BYOO muhn-bound PAK li TAX el) is a chemotherapy drug. It targets fast dividing cells, like cancer cells, and causes these cells to die. This medicine is used to treat advanced breast cancer and advanced lung cancer. This medicine may be used for other purposes; ask your health care provider or pharmacist if you have questions. COMMON BRAND NAME(S): Abraxane What should I tell my health care provider before I take this medicine? They need to know if you have any of these conditions: -kidney disease -liver disease -low blood counts, like low platelets, red blood cells, or white blood cells -recent or ongoing radiation therapy -an unusual or allergic reaction to paclitaxel, albumin, other chemotherapy, other medicines, foods, dyes,  or preservatives -pregnant or trying to get pregnant -breast-feeding How should I use this medicine? This drug is given as an infusion into a vein. It is administered in a hospital or clinic by a specially trained health care professional. Talk to your pediatrician regarding the use of this medicine in children. Special care may be needed. Overdosage: If you think you have taken too much of this medicine contact a poison control center or emergency room at once. NOTE: This medicine is only for you. Do not share this medicine with others. What if I miss a dose? It is important not to miss your dose. Call your doctor or health care professional if you are unable to keep an appointment. What may interact with this medicine? -cyclosporine -diazepam -ketoconazole -medicines to increase blood counts like filgrastim, pegfilgrastim, sargramostim -other chemotherapy drugs like cisplatin, doxorubicin, epirubicin, etoposide, teniposide, vincristine -quinidine -testosterone -vaccines -verapamil Talk to your doctor or health care professional before taking any of these medicines: -acetaminophen -aspirin -ibuprofen -ketoprofen -naproxen This list may not describe all possible interactions. Give your health care provider a list of all the medicines, herbs, non-prescription drugs, or dietary supplements you use. Also tell them if you smoke, drink alcohol, or use illegal drugs. Some items may interact with your medicine. What should I watch for while using this medicine? Your condition will be monitored carefully while you are receiving this medicine. You will need important blood work done while you are taking this medicine. This drug may make you feel generally unwell. This is not uncommon, as chemotherapy can affect healthy cells as well as cancer cells. Report any side effects. Continue your course of treatment even  though you feel ill unless your doctor tells you to stop. In some cases, you may be  given additional medicines to help with side effects. Follow all directions for their use. Call your doctor or health care professional for advice if you get a fever, chills or sore throat, or other symptoms of a cold or flu. Do not treat yourself. This drug decreases your body's ability to fight infections. Try to avoid being around people who are sick. This medicine may increase your risk to bruise or bleed. Call your doctor or health care professional if you notice any unusual bleeding. Be careful brushing and flossing your teeth or using a toothpick because you may get an infection or bleed more easily. If you have any dental work done, tell your dentist you are receiving this medicine. Avoid taking products that contain aspirin, acetaminophen, ibuprofen, naproxen, or ketoprofen unless instructed by your doctor. These medicines may hide a fever. Do not become pregnant while taking this medicine. Women should inform their doctor if they wish to become pregnant or think they might be pregnant. There is a potential for serious side effects to an unborn child. Talk to your health care professional or pharmacist for more information. Do not breast-feed an infant while taking this medicine. Men are advised not to father a child while receiving this medicine. What side effects may I notice from receiving this medicine? Side effects that you should report to your doctor or health care professional as soon as possible: -allergic reactions like skin rash, itching or hives, swelling of the face, lips, or tongue -low blood counts - This drug may decrease the number of white blood cells, red blood cells and platelets. You may be at increased risk for infections and bleeding. -signs of infection - fever or chills, cough, sore throat, pain or difficulty passing urine -signs of decreased platelets or bleeding - bruising, pinpoint red spots on the skin, black, tarry stools, nosebleeds -signs of decreased red blood  cells - unusually weak or tired, fainting spells, lightheadedness -breathing problems -changes in vision -chest pain -high or low blood pressure -mouth sores -nausea and vomiting -pain, swelling, redness or irritation at the injection site -pain, tingling, numbness in the hands or feet -slow or irregular heartbeat -swelling of the ankle, feet, hands Side effects that usually do not require medical attention (report to your doctor or health care professional if they continue or are bothersome): -aches, pains -changes in the color of fingernails -diarrhea -hair loss -loss of appetite This list may not describe all possible side effects. Call your doctor for medical advice about side effects. You may report side effects to FDA at 1-800-FDA-1088. Where should I keep my medicine? This drug is given in a hospital or clinic and will not be stored at home. NOTE: This sheet is a summary. It may not cover all possible information. If you have questions about this medicine, talk to your doctor, pharmacist, or health care provider.  2015, [employer] Standard. (2012-03-06 16:48:50)

## 2014-08-20 ENCOUNTER — Other Ambulatory Visit: Payer: Self-pay | Admitting: Family Medicine

## 2014-08-20 DIAGNOSIS — R609 Edema, unspecified: Secondary | ICD-10-CM

## 2014-08-21 ENCOUNTER — Ambulatory Visit
Admission: RE | Admit: 2014-08-21 | Discharge: 2014-08-21 | Disposition: A | Discharge status: Home / Self Care | Payer: 59 | Source: Ambulatory Visit | Attending: Family Medicine | Admitting: Family Medicine

## 2014-08-21 DIAGNOSIS — R609 Edema, unspecified: Secondary | ICD-10-CM

## 2014-08-29 ENCOUNTER — Ambulatory Visit: Payer: 59 | Admitting: Radiation Oncology

## 2014-08-29 ENCOUNTER — Ambulatory Visit
Admission: RE | Admit: 2014-08-29 | Discharge: 2014-08-29 | Disposition: A | Discharge status: Home / Self Care | Payer: 59 | Source: Ambulatory Visit | Attending: Radiation Oncology | Admitting: Radiation Oncology

## 2014-08-29 DIAGNOSIS — Z510 Encounter for antineoplastic radiation therapy: Secondary | ICD-10-CM | POA: Insufficient documentation

## 2014-08-29 DIAGNOSIS — C50912 Malignant neoplasm of unspecified site of left female breast: Secondary | ICD-10-CM | POA: Diagnosis not present

## 2014-08-29 DIAGNOSIS — C50412 Malignant neoplasm of upper-outer quadrant of left female breast: Secondary | ICD-10-CM

## 2014-08-29 NOTE — Progress Notes (Signed)
Name: Theresa Huff   MRN: [id number]  Date:  08/29/2014  DOB: 1950/03/06  Status:outpatient   DIAGNOSIS: Theresa Huff is a 64 year old female presenting to clinic in regards to her cancer of the left female breast.  CONSENT VERIFIED: Yes SET UP: Patient is setup supine  IMMOBILIZATION: The following immobilization was used:Custom Moldable Pillow, breast board.  NARRATIVE: [name] was brought to the [facility].  Identity was confirmed.  All relevant records and images related to the planned course of therapy were reviewed.  Then, the patient was positioned in a stable reproducible clinical set-up for radiation therapy.  Wires were placed to delineate the clinical extent of breast tissue. A wire was placed on the scar as well.  CT images were obtained.  An isocenter was placed. Skin markings were placed.  The position of the heart was then analyzed.  Due to the proximity of the heart to the chest wall, I felt she would benefit from deep inspiration breath hold for cardiac sparing.  She was then coached and rescanned in the breath hold position.  Acceptable cardiac sparing was achieved. The CT images were loaded into the planning software where the target and avoidance structures were contoured.  The radiation prescription was entered and confirmed. The patient was discharged in stable condition and tolerated simulation well.    TREATMENT PLANNING NOTE/3D Simulation Note Treatment planning then occurred. I have requested : MLC's, isodose plan, basic dose calculation  3D simulation was performed.  I personally supervised and oversaw the construction of 5 medically necessary complex treatment devices in the form of MLCs which will be used for beam modification purposes and to protect critical structures including the heart and lung as well as the immobilization devices which will be used to ensure reproducible set up.  I have requested a dose volume histogram of the heart lung and  tumor cavity.    RESPIRATORY MOTION MANAGEMENT SIMULATION - Deep Inspiration Breath Hold  NARRATIVE:  In order to account for effect of respiratory motion on target structures and other organs in the planning and delivery of radiotherapy, this patient underwent respiratory motion management simulation.  To accomplish this, when the patient was brought to the CT simulation planning suite, a bellows was placed on the her abdomen.  Wave forms of the patient's breathing were obtained. Coaching was performed and practice sessions initiated to monitor her ability to obtain and maintain deep inspiration breath hold.  The CT images were loaded into the planning software and fused with her free breathing images by physics. Acceptable cardiac sparing was achieved through the use of deep inspiration breath hold.  Planning will be performed on her breath hold scan  Special treatment procedure was performed today due to the extra time and effort required by myself to plan and prepare this patient for deep inspiration breath hold technique.  I have determined cardiac sparing to be of benefit to this patient to prevent long term cardiac damage due to radiation of the heart.  Bellows were placed on the patient's abdomen. To facilitate cardiac sparing, the patient was coached by the radiation therapists on breath hold techniques and breathing practice was performed. Practice waveforms were obtained. The patient was then scanned while maintaining breath hold in the treatment position.  This image was then transferred over to the imaging specialist. The imaging specialist then created a fusion of the free breathing and breath hold scans using the chest wall as the stable structure. I personally  reviewed the fusion in axial, coronal and sagittal image planes.  Excellent cardiac sparing was obtained.  I felt the patient is an appropriate candidate for breath hold and the patient will be treated as such.  The image fusion was then  reviewed with the patient to reinforce the necessity of reproducible breath hold.  This document serves as a record of services personally performed by [name] , MD. It was created on her behalf by Lenn Cal, a trained medical scribe. The creation of this record is based on the scribe's personal observations and the provider's statements to them. This document has been checked and approved by the attending provider.   ------------------------------------------------  [name], MD

## 2014-08-30 ENCOUNTER — Encounter: Payer: Self-pay | Admitting: Radiation Oncology

## 2014-09-03 ENCOUNTER — Telehealth: Payer: Self-pay

## 2014-09-03 NOTE — Telephone Encounter (Signed)
Patient sent in basket request if it would be ok to continue light massage over treatment field once she starts radiation.Told her to hold once she starts treatment and discuss with Dr.Wentworth on her weekly assessment on 09/10/14.She also inquired about appointment time change on 09/13/14, she can discuss with therapist on port day 09/05/14.

## 2014-09-05 ENCOUNTER — Ambulatory Visit
Admission: RE | Admit: 2014-09-05 | Discharge: 2014-09-05 | Disposition: A | Discharge status: Home / Self Care | Payer: 59 | Source: Ambulatory Visit | Attending: Radiation Oncology | Admitting: Radiation Oncology

## 2014-09-05 DIAGNOSIS — Z510 Encounter for antineoplastic radiation therapy: Secondary | ICD-10-CM | POA: Diagnosis not present

## 2014-09-09 ENCOUNTER — Ambulatory Visit
Admission: RE | Admit: 2014-09-09 | Discharge: 2014-09-09 | Disposition: A | Discharge status: Home / Self Care | Payer: 59 | Source: Ambulatory Visit | Attending: Radiation Oncology | Admitting: Radiation Oncology

## 2014-09-09 DIAGNOSIS — Z510 Encounter for antineoplastic radiation therapy: Secondary | ICD-10-CM | POA: Diagnosis not present

## 2014-09-10 ENCOUNTER — Ambulatory Visit
Admission: RE | Admit: 2014-09-10 | Discharge: 2014-09-10 | Disposition: A | Discharge status: Home / Self Care | Payer: 59 | Source: Ambulatory Visit | Attending: Radiation Oncology | Admitting: Radiation Oncology

## 2014-09-10 VITALS — BP 107/74 | HR 70 | Temp 98.4°F | Wt 155.2 lb

## 2014-09-10 DIAGNOSIS — Z510 Encounter for antineoplastic radiation therapy: Secondary | ICD-10-CM | POA: Diagnosis not present

## 2014-09-10 DIAGNOSIS — C50412 Malignant neoplasm of upper-outer quadrant of left female breast: Secondary | ICD-10-CM

## 2014-09-10 MED ORDER — BIAFINE EX EMUL: CUTANEOUS | Status: DC | PRN

## 2014-09-10 MED ORDER — ALRA NON-METALLIC DEODORANT (RAD-ONC): 1.0000 "application " | Freq: Once | TOPICAL | Status: DC

## 2014-09-10 NOTE — Progress Notes (Signed)
Weekly Management Note Current Dose: 3.6  Gy  Projected Dose: 50.4 Gy   Narrative:  The patient presents for routine under treatment assessment.  CBCT/MVCT images/Port film x-rays were reviewed.  The chart was checked. Doing well. No complaints.   Physical Findings: Weight: 155 lb 3.2 oz (70.398 kg). Unchanged  Impression:  The patient is tolerating radiation.  Plan:  Continue treatment as planned. Continue radiaplex.

## 2014-09-10 NOTE — Addendum Note (Signed)
Encounter addended by: Norm Salt, RN on: 09/10/2014  4:23 PM<BR>     Documentation filed: Notes Section

## 2014-09-10 NOTE — Progress Notes (Signed)
Pt here for patient teaching.  Pt given Radiation and You booklet, skin care instructions, Alra deodorant and Biafine. Pt reports they have watched the Radiation Therapy Education video on September 10, 2014.  Reviewed areas of pertinence such as  . Pt able to give teach back of to pat skin, use unscented/gentle soap and drink plenty of water,apply Biafine bid, avoid applying anything to skin within 4 hours of treatment, avoid wearing an under wire bra and to use an electric razor if they must shave. Pt demonstrated understanding, needs reinforcement and verbalizes understanding of information given and will contact nursing with any questions or concerns. Routine of clinic reviewed.Weekly assessment of radiation.

## 2014-09-11 ENCOUNTER — Ambulatory Visit
Admission: RE | Admit: 2014-09-11 | Discharge: 2014-09-11 | Disposition: A | Discharge status: Home / Self Care | Payer: 59 | Source: Ambulatory Visit | Attending: Radiation Oncology | Admitting: Radiation Oncology

## 2014-09-11 DIAGNOSIS — Z510 Encounter for antineoplastic radiation therapy: Secondary | ICD-10-CM | POA: Diagnosis not present

## 2014-09-12 ENCOUNTER — Ambulatory Visit
Admission: RE | Admit: 2014-09-12 | Discharge: 2014-09-12 | Disposition: A | Discharge status: Home / Self Care | Payer: 59 | Source: Ambulatory Visit | Attending: Radiation Oncology | Admitting: Radiation Oncology

## 2014-09-12 DIAGNOSIS — Z510 Encounter for antineoplastic radiation therapy: Secondary | ICD-10-CM | POA: Diagnosis not present

## 2014-09-13 ENCOUNTER — Ambulatory Visit
Admission: RE | Admit: 2014-09-13 | Discharge: 2014-09-13 | Disposition: A | Discharge status: Home / Self Care | Payer: 59 | Source: Ambulatory Visit | Attending: Radiation Oncology | Admitting: Radiation Oncology

## 2014-09-13 DIAGNOSIS — Z510 Encounter for antineoplastic radiation therapy: Secondary | ICD-10-CM | POA: Diagnosis not present

## 2014-09-16 ENCOUNTER — Ambulatory Visit
Admission: RE | Admit: 2014-09-16 | Discharge: 2014-09-16 | Disposition: A | Discharge status: Home / Self Care | Payer: 59 | Source: Ambulatory Visit | Attending: Radiation Oncology | Admitting: Radiation Oncology

## 2014-09-16 DIAGNOSIS — Z510 Encounter for antineoplastic radiation therapy: Secondary | ICD-10-CM | POA: Diagnosis not present

## 2014-09-17 ENCOUNTER — Ambulatory Visit
Admission: RE | Admit: 2014-09-17 | Discharge: 2014-09-17 | Disposition: A | Discharge status: Home / Self Care | Payer: 59 | Source: Ambulatory Visit | Attending: Radiation Oncology | Admitting: Radiation Oncology

## 2014-09-17 ENCOUNTER — Encounter: Payer: Self-pay | Admitting: *Deleted

## 2014-09-17 VITALS — BP 117/78 | HR 72 | Temp 97.9°F | Wt 154.8 lb

## 2014-09-17 DIAGNOSIS — Z510 Encounter for antineoplastic radiation therapy: Secondary | ICD-10-CM | POA: Diagnosis not present

## 2014-09-17 DIAGNOSIS — C50412 Malignant neoplasm of upper-outer quadrant of left female breast: Secondary | ICD-10-CM

## 2014-09-17 NOTE — Progress Notes (Signed)
Completed 7 of 25  radiation treatments to left chest wall and supraclavicular region.Denies pain.No skin changes.

## 2014-09-17 NOTE — Progress Notes (Signed)
Weekly Management Note Current Dose: 12.6  Gy  Projected Dose: 50.4 Gy   Narrative:  The patient presents for routine under treatment assessment.  CBCT/MVCT images/Port film x-rays were reviewed.  The chart was checked.   Physical Findings: Weight: 154 lb 12.8 oz (70.217 kg). Unchanged  Impression:  The patient is tolerating radiation.  Plan:  Continue treatment as planned.

## 2014-09-18 ENCOUNTER — Ambulatory Visit
Admission: RE | Admit: 2014-09-18 | Discharge: 2014-09-18 | Disposition: A | Discharge status: Home / Self Care | Payer: 59 | Source: Ambulatory Visit | Attending: Radiation Oncology | Admitting: Radiation Oncology

## 2014-09-18 DIAGNOSIS — Z510 Encounter for antineoplastic radiation therapy: Secondary | ICD-10-CM | POA: Diagnosis not present

## 2014-09-19 ENCOUNTER — Ambulatory Visit
Admission: RE | Admit: 2014-09-19 | Discharge: 2014-09-19 | Disposition: A | Discharge status: Home / Self Care | Payer: 59 | Source: Ambulatory Visit | Attending: Radiation Oncology | Admitting: Radiation Oncology

## 2014-09-19 DIAGNOSIS — Z510 Encounter for antineoplastic radiation therapy: Secondary | ICD-10-CM | POA: Diagnosis not present

## 2014-09-20 ENCOUNTER — Ambulatory Visit
Admission: RE | Admit: 2014-09-20 | Discharge: 2014-09-20 | Disposition: A | Discharge status: Home / Self Care | Payer: 59 | Source: Ambulatory Visit | Attending: Radiation Oncology | Admitting: Radiation Oncology

## 2014-09-20 DIAGNOSIS — Z510 Encounter for antineoplastic radiation therapy: Secondary | ICD-10-CM | POA: Diagnosis not present

## 2014-09-23 ENCOUNTER — Ambulatory Visit
Admission: RE | Admit: 2014-09-23 | Discharge: 2014-09-23 | Disposition: A | Discharge status: Home / Self Care | Payer: 59 | Source: Ambulatory Visit | Attending: Radiation Oncology | Admitting: Radiation Oncology

## 2014-09-23 DIAGNOSIS — Z510 Encounter for antineoplastic radiation therapy: Secondary | ICD-10-CM | POA: Diagnosis not present

## 2014-09-24 ENCOUNTER — Ambulatory Visit
Admission: RE | Admit: 2014-09-24 | Discharge: 2014-09-24 | Disposition: A | Discharge status: Home / Self Care | Payer: 59 | Source: Ambulatory Visit | Attending: Radiation Oncology | Admitting: Radiation Oncology

## 2014-09-24 VITALS — BP 118/81 | HR 63 | Temp 97.8°F | Wt 154.8 lb

## 2014-09-24 DIAGNOSIS — C50412 Malignant neoplasm of upper-outer quadrant of left female breast: Secondary | ICD-10-CM

## 2014-09-24 DIAGNOSIS — Z510 Encounter for antineoplastic radiation therapy: Secondary | ICD-10-CM | POA: Diagnosis not present

## 2014-09-24 NOTE — Progress Notes (Signed)
Weekly Management Note Current Dose: 21.6 Gy  Projected Dose: 50.4 Gy   Narrative:  The patient presents for routine under treatment assessment.  CBCT/MVCT images/Port film x-rays were reviewed.  The chart was checked.   Physical Findings: Weight: 154 lb 12.8 oz (70.217 kg). Unchanged. Pink Left chest wall.   Impression:  The patient is tolerating radiation.  Plan:  Continue treatment as planned. Continue biafene.

## 2014-09-24 NOTE — Progress Notes (Signed)
Weekly assessment of radiation to left breast.Completed 12 of 25 fractions.No skin changes.Denies pain.Continue application of biafine.

## 2014-09-25 ENCOUNTER — Ambulatory Visit
Admission: RE | Admit: 2014-09-25 | Discharge: 2014-09-25 | Disposition: A | Discharge status: Home / Self Care | Payer: 59 | Source: Ambulatory Visit | Attending: Radiation Oncology | Admitting: Radiation Oncology

## 2014-09-25 DIAGNOSIS — Z510 Encounter for antineoplastic radiation therapy: Secondary | ICD-10-CM | POA: Diagnosis not present

## 2014-09-26 ENCOUNTER — Ambulatory Visit
Admission: RE | Admit: 2014-09-26 | Discharge: 2014-09-26 | Disposition: A | Discharge status: Home / Self Care | Payer: 59 | Source: Ambulatory Visit | Attending: Radiation Oncology | Admitting: Radiation Oncology

## 2014-09-26 DIAGNOSIS — C50412 Malignant neoplasm of upper-outer quadrant of left female breast: Secondary | ICD-10-CM | POA: Insufficient documentation

## 2014-09-27 ENCOUNTER — Ambulatory Visit
Admission: RE | Admit: 2014-09-27 | Discharge: 2014-09-27 | Disposition: A | Discharge status: Home / Self Care | Payer: 59 | Source: Ambulatory Visit | Attending: Radiation Oncology | Admitting: Radiation Oncology

## 2014-09-27 DIAGNOSIS — C50412 Malignant neoplasm of upper-outer quadrant of left female breast: Secondary | ICD-10-CM | POA: Diagnosis not present

## 2014-10-01 ENCOUNTER — Ambulatory Visit
Admission: RE | Admit: 2014-10-01 | Discharge: 2014-10-01 | Disposition: A | Discharge status: Home / Self Care | Payer: 59 | Source: Ambulatory Visit | Attending: Radiation Oncology | Admitting: Radiation Oncology

## 2014-10-01 VITALS — BP 126/71 | HR 68 | Temp 98.2°F | Wt 154.1 lb

## 2014-10-01 DIAGNOSIS — C50412 Malignant neoplasm of upper-outer quadrant of left female breast: Secondary | ICD-10-CM

## 2014-10-01 NOTE — Progress Notes (Signed)
Weekly Management Note Current Dose: 28.8 Gy  Projected Dose: 50.4 Gy   Narrative:  The patient presents for routine under treatment assessment.  CBCT/MVCT images/Port film x-rays were reviewed.  The chart was checked.   Physical Findings: Weight: 154 lb 1.6 oz (69.899 kg). Unchanged. Pink Left chest wall. No desquamation.   Impression:  The patient is tolerating radiation.  Plan:  Continue treatment as planned. Continue biafene.

## 2014-10-01 NOTE — Progress Notes (Signed)
Weekly assessment of radiation to left chest wall and supraclavicular region.Completed 16 of 25 fractions.Has some chestwall redness with itching rash at times.Continue application of biafine twice daily.

## 2014-10-02 ENCOUNTER — Ambulatory Visit: Payer: 59

## 2014-10-02 DIAGNOSIS — C50412 Malignant neoplasm of upper-outer quadrant of left female breast: Secondary | ICD-10-CM | POA: Diagnosis not present

## 2014-10-03 ENCOUNTER — Ambulatory Visit: Payer: 59

## 2014-10-03 DIAGNOSIS — C50412 Malignant neoplasm of upper-outer quadrant of left female breast: Secondary | ICD-10-CM | POA: Diagnosis not present

## 2014-10-04 ENCOUNTER — Ambulatory Visit
Admission: RE | Admit: 2014-10-04 | Discharge: 2014-10-04 | Disposition: A | Discharge status: Home / Self Care | Payer: 59 | Source: Ambulatory Visit | Attending: Radiation Oncology | Admitting: Radiation Oncology

## 2014-10-04 DIAGNOSIS — C50412 Malignant neoplasm of upper-outer quadrant of left female breast: Secondary | ICD-10-CM | POA: Diagnosis not present

## 2014-10-07 ENCOUNTER — Ambulatory Visit
Admission: RE | Admit: 2014-10-07 | Discharge: 2014-10-07 | Disposition: A | Discharge status: Home / Self Care | Payer: 59 | Source: Ambulatory Visit | Attending: Radiation Oncology | Admitting: Radiation Oncology

## 2014-10-07 ENCOUNTER — Encounter: Payer: Self-pay | Admitting: Hematology and Oncology

## 2014-10-07 ENCOUNTER — Other Ambulatory Visit: Payer: Self-pay

## 2014-10-07 ENCOUNTER — Encounter: Payer: Self-pay | Admitting: Radiation Oncology

## 2014-10-07 DIAGNOSIS — C50412 Malignant neoplasm of upper-outer quadrant of left female breast: Secondary | ICD-10-CM | POA: Diagnosis not present

## 2014-10-07 NOTE — Progress Notes (Signed)
LMOVM - Pt to return call to clinic if she can come in 9/13 at 3 pm.

## 2014-10-08 ENCOUNTER — Telehealth: Payer: Self-pay | Admitting: Hematology and Oncology

## 2014-10-08 ENCOUNTER — Encounter: Payer: Self-pay | Admitting: Hematology and Oncology

## 2014-10-08 ENCOUNTER — Ambulatory Visit
Admission: RE | Admit: 2014-10-08 | Discharge: 2014-10-08 | Disposition: A | Discharge status: Home / Self Care | Payer: 59 | Source: Ambulatory Visit | Attending: Radiation Oncology | Admitting: Radiation Oncology

## 2014-10-08 ENCOUNTER — Ambulatory Visit (HOSPITAL_BASED_OUTPATIENT_CLINIC_OR_DEPARTMENT_OTHER): Payer: 59 | Admitting: Hematology and Oncology

## 2014-10-08 VITALS — BP 124/73 | HR 82 | Temp 98.5°F | Resp 18 | Ht 65.0 in | Wt 155.6 lb

## 2014-10-08 DIAGNOSIS — R21 Rash and other nonspecific skin eruption: Secondary | ICD-10-CM | POA: Diagnosis not present

## 2014-10-08 DIAGNOSIS — C773 Secondary and unspecified malignant neoplasm of axilla and upper limb lymph nodes: Secondary | ICD-10-CM | POA: Diagnosis not present

## 2014-10-08 DIAGNOSIS — Z170 Estrogen receptor positive status [ER+]: Secondary | ICD-10-CM | POA: Diagnosis not present

## 2014-10-08 DIAGNOSIS — C50412 Malignant neoplasm of upper-outer quadrant of left female breast: Secondary | ICD-10-CM

## 2014-10-08 DIAGNOSIS — G43001 Migraine without aura, not intractable, with status migrainosus: Secondary | ICD-10-CM

## 2014-10-08 MED ORDER — ANASTROZOLE 1 MG PO TABS: 1.0000 mg | ORAL_TABLET | Freq: Every day | ORAL | Status: DC

## 2014-10-08 NOTE — Assessment & Plan Note (Signed)
Left breast invasive ductal carcinoma grade 1; 4.8 cm with low-grade DCIS; margins negative 1/4 lymph nodes positive, ER 100% PR 100% HER-2 negative ratio 1.18 Ki-67 26% and 19% T2 N1 stage IIB status post adjuvant chemotherapy with dose dense Adriamycin and Cytoxan 4 followed by Abraxane weekly 12  Current treatment: Adjuvant radiation therapy Treatment plan: Anti-estrogen therapy with anastrozole 1 mg daily to follow adjuvant radiation  Skin rash on the upper back and neck: Unclear etiology could be contact dermatitis. Patient has triamcinolone ointment. I instructed her to apply 2-3 times a day. I also instructed her to apply calamine lotion for moisturizer. This should help with itching sensation as well as heel to skin.  Anastrozole counseling:We discussed the risks and benefits of anti-estrogen therapy with aromatase inhibitors. These include but not limited to insomnia, hot flashes, mood changes, vaginal dryness, bone density loss, and weight gain. Although rare, serious side effects including endometrial cancer, risk of blood clots were also discussed. We strongly believe that the benefits far outweigh the risks. Patient understands these risks and consented to starting treatment. Planned treatment duration is 5 years.  Plan: 1. Obtain bone density test at [facility] 2. Start anastrozole 2 weeks after finishing radiation 3. Patient takes vitamin D orally and instruct her to add calcium twice a day.  Return to clinic as previously scheduled

## 2014-10-08 NOTE — Progress Notes (Signed)
Patient Care Team: Antony Contras, MD as PCP - General (Family Medicine) Elsie Stain, MD (Pulmonary Disease) Rolm Bookbinder, MD as Consulting Physician (General Surgery) Nicholas Lose, MD as Consulting Physician (Hematology and Oncology) [name], MD as Consulting Physician (Radiation Oncology) Rockwell Germany, RN as Registered Nurse Mauro Kaufmann, RN as Registered Nurse Holley Bouche, NP as Nurse Practitioner (Nurse Practitioner)  DIAGNOSIS: Breast cancer of upper-outer quadrant of left female breast   Staging form: Breast, AJCC 7th Edition     Clinical stage from 02/13/2014: Stage IIB (T3, N0, M0) - Unsigned       Staging comments: Staged at breast conference on 1.20.16      Pathologic stage from 03/11/2014: Stage IIB (T2, N1a, cM0) - Signed by Seward Grater, MD on 03/19/2014       Staging comments: Staged on final mastectomy specimen by Dr. Avis Epley    SUMMARY OF ONCOLOGIC HISTORY:   Breast cancer of upper-outer quadrant of left female breast   02/04/2014 Initial Diagnosis left breast: Invasive ductal carcinoma with DCIS ER 100%, PR 100%, HER-2 negative, Ki-67 ranged from 19-26%; third biopsy showed invasive mammary cancer with lobular features and intraductal papilloma, ER/PR positive HER-2 negative   02/11/2014 Breast MRI left breast: 3 masses that are confluent measuring 5.6 x 2.7 x 3 cm extends to the left aspect of area along the penis to involve skin of the area no lymph nodes detected right breast negative   03/07/2014 Surgery Left breast invasive ductal carcinoma grade 1; 4.8 cm with low-grade DCIS; margins negative 1/4 lymph nodes positive, ER 100% PR 100% HER-2 negative ratio 1.18 Ki-67 26% and 19% T2 N1 stage IIB   04/03/2014 -  Chemotherapy Adjuvant chemotherapy with dose dense Adriamycin and Cytoxan followed by weekly Abraxane 12    CHIEF COMPLIANT: skin rash on the upper back and neck  INTERVAL HISTORY: Theresa Huff is a 64 year old above-mentioned history  of left breast cancer was currently on adjuvant radiation therapy. She's been tolerating radiation fairly well except for the chest rash. She also complains of a rash on the upper back as well as the neck which is severely itching and she likes to scratch it a lot.  REVIEW OF SYSTEMS:   Constitutional: Denies fevers, chills or abnormal weight loss Eyes: Denies blurriness of vision Ears, nose, mouth, throat, and face: Denies mucositis or sore throat Respiratory: Denies cough, dyspnea or wheezes Cardiovascular: Denies palpitation, chest discomfort or lower extremity swelling Gastrointestinal:  Denies nausea, heartburn or change in bowel habits Skin: rash on the upper back and neck Lymphatics: Denies new lymphadenopathy or easy bruising Neurological:Denies numbness, tingling or new weaknesses Behavioral/Psych: Mood is stable, no new changes  Breast: redness from the effects of radiation All other systems were reviewed with the patient and are negative.  I have reviewed the past medical history, past surgical history, social history and family history with the patient and they are unchanged from previous note.  ALLERGIES:  is allergic to hydrocodone; codeine; iodine; primidone; radiaplexrx; sulfa antibiotics; and tape.  MEDICATIONS:  Current Outpatient Prescriptions  Medication Sig Dispense Refill  . anastrozole (ARIMIDEX) 1 MG tablet Take 1 tablet (1 mg total) by mouth daily. 90 tablet 3  . calcium carbonate (OS-CAL) 600 MG TABS tablet Take 600 mg by mouth daily.    . cetirizine (ZYRTEC) 10 MG tablet Take 10 mg by mouth daily.      . Cholecalciferol (VITAMIN D PO) Take 2,000 Units by mouth daily.    Marland Kitchen  dexamethasone (DECADRON) 4 MG tablet   1  . DULoxetine (CYMBALTA) 30 MG capsule Take 30 mg by mouth daily.     Marland Kitchen esomeprazole (NEXIUM) 20 MG packet Take 20 mg by mouth daily before breakfast.    . Eszopiclone (ESZOPICLONE) 3 MG TABS Take 3 mg by mouth at bedtime. Take immediately before  bedtime    . lidocaine-prilocaine (EMLA) cream     . LORazepam (ATIVAN) 0.5 MG tablet   0  . mometasone (NASONEX) 50 MCG/ACT nasal spray Place 2 sprays into the nose daily.      . ondansetron (ZOFRAN) 8 MG tablet   1  . oxyCODONE (OXY IR/ROXICODONE) 5 MG immediate release tablet Take 1-2 tablets (5-10 mg total) by mouth every 4 (four) hours as needed for moderate pain. 30 tablet 0  . prochlorperazine (COMPAZINE) 10 MG tablet   1  . SUMAtriptan (IMITREX) 50 MG tablet Take 1 tablet (50 mg total) by mouth every 2 (two) hours as needed for migraine (Max of 2 tabs per 24 hour period.). 10 tablet 0  . triamcinolone cream (KENALOG) 0.1 %     . Vitamin D, Ergocalciferol, (DRISDOL) 50000 UNITS CAPS capsule Take 50,000 Units by mouth every 7 (seven) days.     No current facility-administered medications for this visit.    PHYSICAL EXAMINATION: ECOG PERFORMANCE STATUS: 1 - Symptomatic but completely ambulatory  Filed Vitals:   10/08/14 1504  BP: 124/73  Pulse: 82  Temp: 98.5 F (36.9 C)  Resp: 18   Filed Weights   10/08/14 1504  Weight: 155 lb 9.6 oz (70.58 kg)    GENERAL:alert, no distress and comfortable SKIN: rash on the upper back related redness due to extensive scratching as well as a papular rash on the neck bilaterally, significant redness of the chest due to radiation EYES: normal, Conjunctiva are pink and non-injected, sclera clear OROPHARYNX:no exudate, no erythema and lips, buccal mucosa, and tongue normal  NECK: supple, thyroid normal size, non-tender, without nodularity LYMPH:  no palpable lymphadenopathy in the cervical, axillary or inguinal LUNGS: clear to auscultation and percussion with normal breathing effort HEART: regular rate & rhythm and no murmurs and no lower extremity edema ABDOMEN:abdomen soft, non-tender and normal bowel sounds Musculoskeletal:no cyanosis of digits and no clubbing  NEURO: alert & oriented x 3 with fluent speech, no focal motor/sensory  deficits   LABORATORY DATA:  I have reviewed the data as listed   Chemistry      Component Value Date/Time   NA 141 08/14/2014 0745   NA 139 05/03/2014 1946   K 4.2 08/14/2014 0745   K 4.1 05/03/2014 1946   CL 103 05/03/2014 1946   CO2 25 08/14/2014 0745   CO2 26 05/03/2014 1946   BUN 7.3 08/14/2014 0745   BUN 17 05/03/2014 1946   CREATININE 0.7 08/14/2014 0745   CREATININE 0.77 05/03/2014 1946      Component Value Date/Time   CALCIUM 9.4 08/14/2014 0745   CALCIUM 9.6 05/03/2014 1946   ALKPHOS 55 08/14/2014 0745   ALKPHOS 76 09/14/2010 1708   AST 23 08/14/2014 0745   AST 18 09/14/2010 1708   ALT 24 08/14/2014 0745   ALT 21 09/14/2010 1708   BILITOT 0.45 08/14/2014 0745   BILITOT 0.4 09/14/2010 1708       Lab Results  Component Value Date   WBC 3.0* 08/14/2014   HGB 11.2* 08/14/2014   HCT 34.6* 08/14/2014   MCV 94.3 08/14/2014   PLT 293 08/14/2014  NEUTROABS 1.4* 08/14/2014   ASSESSMENT & PLAN:  Breast cancer of upper-outer quadrant of left female breast Left breast invasive ductal carcinoma grade 1; 4.8 cm with low-grade DCIS; margins negative 1/4 lymph nodes positive, ER 100% PR 100% HER-2 negative ratio 1.18 Ki-67 26% and 19% T2 N1 stage IIB status post adjuvant chemotherapy with dose dense Adriamycin and Cytoxan 4 followed by Abraxane weekly 12  Current treatment: Adjuvant radiation therapy Treatment plan: Anti-estrogen therapy with anastrozole 1 mg daily to follow adjuvant radiation  Skin rash on the upper back and neck: Unclear etiology could be contact dermatitis. Patient has triamcinolone ointment. I instructed her to apply 2-3 times a day. I also instructed her to apply calamine lotion for moisturizer. This should help with itching sensation as well as heel to skin.  Anastrozole counseling:We discussed the risks and benefits of anti-estrogen therapy with aromatase inhibitors. These include but not limited to insomnia, hot flashes, mood changes, vaginal  dryness, bone density loss, and weight gain. Although rare, serious side effects including endometrial cancer, risk of blood clots were also discussed. We strongly believe that the benefits far outweigh the risks. Patient understands these risks and consented to starting treatment. Planned treatment duration is 5 years.  Plan: 1. Obtain bone density test at [facility] 2. Start anastrozole 2 weeks after finishing radiation 3. Patient takes vitamin D orally and instruct her to add calcium twice a day.  Return to clinic as previously scheduled   Orders Placed This Encounter  Procedures  . DG Bone Density    Standing Status: Future     Number of Occurrences:      Standing Expiration Date: 10/08/2015    Order Specific Question:  Reason for Exam (SYMPTOM  OR DIAGNOSIS REQUIRED)    Answer:  Eval for osteoporosis starting anti estrogen therapy    Order Specific Question:  Preferred imaging location?    Answer:  External     Comments:  [facility]   The patient has a good understanding of the overall plan. she agrees with it. she will call with any problems that may develop before the next visit here.   Rulon Eisenmenger, MD

## 2014-10-08 NOTE — Telephone Encounter (Signed)
Patient called in as she had a message from terri f to come in today at 3:00,there was no pof and i will let terri know that i have booked her.

## 2014-10-08 NOTE — Telephone Encounter (Signed)
[facility] dexa scan 11/06/14 9;00,patient aware and order faxed

## 2014-10-09 ENCOUNTER — Ambulatory Visit
Admission: RE | Admit: 2014-10-09 | Discharge: 2014-10-09 | Disposition: A | Discharge status: Home / Self Care | Payer: 59 | Source: Ambulatory Visit | Attending: Radiation Oncology | Admitting: Radiation Oncology

## 2014-10-09 DIAGNOSIS — C50412 Malignant neoplasm of upper-outer quadrant of left female breast: Secondary | ICD-10-CM | POA: Diagnosis not present

## 2014-10-10 ENCOUNTER — Ambulatory Visit
Admission: RE | Admit: 2014-10-10 | Discharge: 2014-10-10 | Disposition: A | Discharge status: Home / Self Care | Payer: 59 | Source: Ambulatory Visit | Attending: Radiation Oncology | Admitting: Radiation Oncology

## 2014-10-10 ENCOUNTER — Encounter: Payer: Self-pay | Admitting: Radiation Oncology

## 2014-10-10 VITALS — BP 131/83 | HR 68 | Temp 98.2°F | Ht 65.0 in | Wt 156.0 lb

## 2014-10-10 DIAGNOSIS — C50412 Malignant neoplasm of upper-outer quadrant of left female breast: Secondary | ICD-10-CM

## 2014-10-10 NOTE — Progress Notes (Signed)
Weekly Management Note Current Dose: 41.4  Gy  Projected Dose: 61 Gy   Narrative:  The patient presents for routine under treatment assessment.  CBCT/MVCT images/Port film x-rays were reviewed.  The chart was checked. Doing well. Itching and discomfort under arm and lower chest wall. Taking Lunesta to help sleep. Using biafene  Physical Findings: Weight: 156 lb (70.761 kg). Dry desquamation over chest wall.   Impression:  The patient is tolerating radiation.  Plan:  Continue treatment as planned. Add hydrocortisone if needed. OK to take anti-inflammatory at night.

## 2014-10-10 NOTE — Progress Notes (Signed)
Theresa Huff has received 23 fractions to her left chest wall.  Reports tenderness in the axillary region and discomfort when sleeping. Pain level between 4-7.  Note bright erythema in the entire filed.  Skin remains intact.  Given Biafine. Denies any pain.

## 2014-10-11 ENCOUNTER — Ambulatory Visit
Admission: RE | Admit: 2014-10-11 | Discharge: 2014-10-11 | Disposition: A | Discharge status: Home / Self Care | Payer: 59 | Source: Ambulatory Visit | Attending: Radiation Oncology | Admitting: Radiation Oncology

## 2014-10-11 DIAGNOSIS — C50412 Malignant neoplasm of upper-outer quadrant of left female breast: Secondary | ICD-10-CM | POA: Diagnosis not present

## 2014-10-14 ENCOUNTER — Ambulatory Visit
Admission: RE | Admit: 2014-10-14 | Discharge: 2014-10-14 | Disposition: A | Discharge status: Home / Self Care | Payer: 59 | Source: Ambulatory Visit | Attending: Radiation Oncology | Admitting: Radiation Oncology

## 2014-10-14 DIAGNOSIS — C50412 Malignant neoplasm of upper-outer quadrant of left female breast: Secondary | ICD-10-CM | POA: Diagnosis not present

## 2014-10-15 ENCOUNTER — Ambulatory Visit
Admission: RE | Admit: 2014-10-15 | Discharge: 2014-10-15 | Disposition: A | Discharge status: Home / Self Care | Payer: 59 | Source: Ambulatory Visit | Attending: Radiation Oncology | Admitting: Radiation Oncology

## 2014-10-15 VITALS — BP 128/69 | HR 60 | Temp 98.0°F | Wt 155.1 lb

## 2014-10-15 DIAGNOSIS — C50412 Malignant neoplasm of upper-outer quadrant of left female breast: Secondary | ICD-10-CM | POA: Diagnosis not present

## 2014-10-15 NOTE — Progress Notes (Signed)
BP 128/69 mmHg  Pulse 60  Temp(Src) 98 F (36.7 C)  Wt 155 lb 1.6 oz (70.353 kg)  Weekly assessment to left chestwall.Completed 26 of 28 treatments.Skin is markedly red.Continue with biafine for 2 to 3 weeks then change to lotion with vitamin e.Given card to schedule one month follow up.

## 2014-10-15 NOTE — Addendum Note (Signed)
Encounter addended by: [name], MD on: 10/15/2014  5:37 PM<BR>     Documentation filed: [employer], Orders

## 2014-10-15 NOTE — Progress Notes (Signed)
Weekly Management Note Current Dose: 46.8  Gy  Projected Dose: 50.4 Gy   Narrative:  The patient presents for routine under treatment assessment.  CBCT/MVCT images/Port film x-rays were reviewed.  The chart was checked. Doing well. Excited about trip. Skin irritated.   Physical Findings: Weight: 155 lb 1.6 oz (70.353 kg). Dry desquamation over chest wall.  Impression:  The patient is tolerating radiation.  Plan:  Continue treatment as planned. Discussed post RT skin care.

## 2014-10-16 ENCOUNTER — Ambulatory Visit
Admission: RE | Admit: 2014-10-16 | Discharge: 2014-10-16 | Disposition: A | Discharge status: Home / Self Care | Payer: 59 | Source: Ambulatory Visit | Attending: Radiation Oncology | Admitting: Radiation Oncology

## 2014-10-16 DIAGNOSIS — C50412 Malignant neoplasm of upper-outer quadrant of left female breast: Secondary | ICD-10-CM | POA: Diagnosis not present

## 2014-10-17 ENCOUNTER — Telehealth: Payer: Self-pay | Admitting: Nurse Practitioner

## 2014-10-17 ENCOUNTER — Encounter: Payer: Self-pay | Admitting: Hematology and Oncology

## 2014-10-17 ENCOUNTER — Ambulatory Visit
Admission: RE | Admit: 2014-10-17 | Discharge: 2014-10-17 | Disposition: A | Discharge status: Home / Self Care | Payer: 59 | Source: Ambulatory Visit | Attending: Radiation Oncology | Admitting: Radiation Oncology

## 2014-10-17 ENCOUNTER — Telehealth: Payer: Self-pay | Admitting: Hematology and Oncology

## 2014-10-17 DIAGNOSIS — C50412 Malignant neoplasm of upper-outer quadrant of left female breast: Secondary | ICD-10-CM | POA: Diagnosis not present

## 2014-10-17 NOTE — Telephone Encounter (Signed)
Returned patients call as she needs to reschedule her appointment

## 2014-10-17 NOTE — Telephone Encounter (Signed)
Gave [facility] appt for 10/22 @ 3:30 w/Heather Jake Shark

## 2014-10-21 ENCOUNTER — Telehealth: Payer: Self-pay | Admitting: *Deleted

## 2014-10-21 NOTE — Telephone Encounter (Signed)
Spoke with patient to follow up post XRT therapy.  She states she is doing well except for some peeling of her skin where it was radiated.  She is happy to be done and planning a trip to [place] 10/21- 11/5. Encouraged her to call with any needs or concerns and that I would follow up with her at her appointment with Dr. Lindi Adie 10/19.

## 2014-10-24 ENCOUNTER — Encounter: Payer: Self-pay | Admitting: Radiation Oncology

## 2014-10-24 NOTE — Progress Notes (Signed)
Radiation Oncology         [phone] ________________________________  Name: Theresa Huff      MRN: [id number]          Date: 08/29/14              DOB: 09/10/1950  Optical Surface Tracking Plan:  Since intensity modulated radiotherapy (IMRT) and 3D conformal radiation treatment methods are predicated on accurate and precise positioning for treatment, intrafraction motion monitoring is medically necessary to ensure accurate and safe treatment delivery.  The ability to quantify intrafraction motion without excessive ionizing radiation dose can only be performed with optical surface tracking. Accordingly, surface imaging offers the opportunity to obtain 3D measurements of patient position throughout IMRT and 3D treatments without excessive radiation exposure.  I am ordering optical surface tracking for this patient's upcoming course of radiotherapy. ________________________________ Signature   Reference:   Ursula Alert, J, et al. Surface imaging-based analysis of intrafraction motion for breast radiotherapy patients.Journal of [employer], n. 6, nov. 2014. ISSN [id number].   Available at: <http://www.jacmp.org/index.php/jacmp/article/view/4957>.

## 2014-10-24 NOTE — Progress Notes (Signed)
  Radiation Oncology         [phone] ________________________________  Name: DEZTINEE LOHMEYER MRN: [id number]  Date: 10/24/2014  DOB: 03-13-1950  End of Treatment Note  Diagnosis:  Breast cancer of upper-outer quadrant of left female breast   Staging form: Breast, AJCC 7th Edition     Clinical stage from 02/13/2014: Stage IIB (T3, N0, M0) - Unsigned       Staging comments: Staged at breast conference on 1.20.16      Pathologic stage from 03/11/2014: Stage IIB (T2, N1a, cM0) - Signed by Seward Grater, MD on 03/19/2014       Staging comments: Staged on final mastectomy specimen by Dr. Avis Epley   Indication for treatment:  Curative      Radiation treatment dates: 09/09/2014-10/17/2014  Site/dose:   Left chest wall 50.4 Gy at 1.8 Gy per fraction x 28 fractions Left supraclavicular fossa and axilla/ 45 Gy at 1.8 Gy per fraction x 25 fractions  Beams/energy:  Opposed tangents with reduced fields / 6 MV photons RAO/PA 6/10 MV photons  Narrative: The patient tolerated radiation treatment relatively well. She had dry desquamation over her chest well, which was treated with Biafine.  Plan: The patient has completed radiation treatment. The patient will return to radiation oncology clinic for routine followup in one month. I advised them to call or return sooner if they have any questions or concerns related to their recovery or treatment.   This document serves as a record of services personally performed by [name] , MD. It was created on her behalf by Lenn Cal, a trained medical scribe. The creation of this record is based on the scribe's personal observations and the Sladen Plancarte's statements to them. This document has been checked and approved by the attending Lynnlee Revels.   ------------------------------------------------  [name], MD

## 2014-10-24 NOTE — Progress Notes (Signed)
08/29/14  Special treatment procedure was performed today due to the extra time and effort required by myself to plan and prepare this patient for deep inspiration breath hold technique.  I have determined cardiac sparing to be of benefit to this patient to prevent long term cardiac damage due to radiation of the heart.  Bellows were placed on the patient's abdomen. To facilitate cardiac sparing, the patient was coached by the radiation therapists on breath hold techniques and breathing practice was performed. Practice waveforms were obtained. The patient was then scanned while maintaining breath hold in the treatment position.  This image was then transferred over to the imaging specialist. The imaging specialist then created a fusion of the free breathing and breath hold scans using the chest wall as the stable structure. I personally reviewed the fusion in axial, coronal and sagittal image planes.  Excellent cardiac sparing was obtained.  I felt the patient is an appropriate candidate for breath hold and the patient will be treated as such.  The image fusion was then reviewed with the patient to reinforce the necessity of reproducible breath hold.

## 2014-11-01 ENCOUNTER — Encounter: Payer: Self-pay | Admitting: Medical Oncology

## 2014-11-01 ENCOUNTER — Encounter: Payer: Self-pay | Admitting: Hematology and Oncology

## 2014-11-12 NOTE — Assessment & Plan Note (Signed)
Left breast invasive ductal carcinoma grade 1; 4.8 cm with low-grade DCIS; margins negative 1/4 lymph nodes positive, ER 100% PR 100% HER-2 negative ratio 1.18 Ki-67 26% and 19% T2 N1 stage IIB status post adjuvant chemotherapy with dose dense Adriamycin and Cytoxan 4 followed by Abraxane weekly 12 Completed Adjuvant radiation therapy 10/17/14  Treatment plan: Anti-estrogen therapy with anastrozole 1 mg daily.  Skin rash on the upper back and neck: Unclear etiology  RTC 3 months

## 2014-11-13 ENCOUNTER — Other Ambulatory Visit (HOSPITAL_BASED_OUTPATIENT_CLINIC_OR_DEPARTMENT_OTHER): Payer: 59

## 2014-11-13 ENCOUNTER — Ambulatory Visit (HOSPITAL_BASED_OUTPATIENT_CLINIC_OR_DEPARTMENT_OTHER): Payer: 59 | Admitting: Hematology and Oncology

## 2014-11-13 ENCOUNTER — Encounter: Payer: 59 | Admitting: Medical Oncology

## 2014-11-13 ENCOUNTER — Encounter: Payer: Self-pay | Admitting: Hematology and Oncology

## 2014-11-13 VITALS — BP 117/67 | HR 87 | Temp 97.7°F | Resp 18 | Ht 65.0 in | Wt 153.0 lb

## 2014-11-13 DIAGNOSIS — C50412 Malignant neoplasm of upper-outer quadrant of left female breast: Secondary | ICD-10-CM

## 2014-11-13 DIAGNOSIS — Z923 Personal history of irradiation: Secondary | ICD-10-CM | POA: Diagnosis not present

## 2014-11-13 DIAGNOSIS — Z9221 Personal history of antineoplastic chemotherapy: Secondary | ICD-10-CM

## 2014-11-13 DIAGNOSIS — Z79811 Long term (current) use of aromatase inhibitors: Secondary | ICD-10-CM

## 2014-11-13 DIAGNOSIS — Z170 Estrogen receptor positive status [ER+]: Secondary | ICD-10-CM | POA: Diagnosis not present

## 2014-11-13 LAB — COMPREHENSIVE METABOLIC PANEL (CC13)
ALK PHOS: 74 U/L (ref 40–150)
ALT: 28 U/L (ref 0–55)
ANION GAP: 8 meq/L (ref 3–11)
AST: 24 U/L (ref 5–34)
Albumin: 3.7 g/dL (ref 3.5–5.0)
BILIRUBIN TOTAL: 0.59 mg/dL (ref 0.20–1.20)
BUN: 10.7 mg/dL (ref 7.0–26.0)
CO2: 27 meq/L (ref 22–29)
Calcium: 9.7 mg/dL (ref 8.4–10.4)
Chloride: 107 mEq/L (ref 98–109)
Creatinine: 0.8 mg/dL (ref 0.6–1.1)
EGFR: 78 mL/min/{1.73_m2} — ABNORMAL LOW (ref >90)
Glucose: 59 mg/dl — ABNORMAL LOW (ref 70–140)
POTASSIUM: 4.2 meq/L (ref 3.5–5.1)
SODIUM: 142 meq/L (ref 136–145)
TOTAL PROTEIN: 6.3 g/dL — AB (ref 6.4–8.3)

## 2014-11-13 LAB — CBC WITH DIFFERENTIAL/PLATELET
BASO%: 0.5 % (ref 0.0–2.0)
Basophils Absolute: 0 10*3/uL (ref 0.0–0.1)
EOS ABS: 0.2 10*3/uL (ref 0.0–0.5)
EOS%: 3.4 % (ref 0.0–7.0)
HCT: 39.1 % (ref 34.8–46.6)
HGB: 12.8 g/dL (ref 11.6–15.9)
LYMPH%: 21.1 % (ref 14.0–49.7)
MCH: 30.1 pg (ref 25.1–34.0)
MCHC: 32.7 g/dL (ref 31.5–36.0)
MCV: 92 fL (ref 79.5–101.0)
MONO#: 0.5 10*3/uL (ref 0.1–0.9)
MONO%: 12.4 % (ref 0.0–14.0)
NEUT%: 62.6 % (ref 38.4–76.8)
NEUTROS ABS: 2.7 10*3/uL (ref 1.5–6.5)
PLATELETS: 225 10*3/uL (ref 145–400)
RBC: 4.25 10*6/uL (ref 3.70–5.45)
RDW: 14.5 % (ref 11.2–14.5)
WBC: 4.4 10*3/uL (ref 3.9–10.3)
lymph#: 0.9 10*3/uL (ref 0.9–3.3)

## 2014-11-13 NOTE — Progress Notes (Signed)
Patient Care Team: Antony Contras, MD as PCP - General (Family Medicine) Elsie Stain, MD (Pulmonary Disease) Rolm Bookbinder, MD as Consulting Physician (General Surgery) Nicholas Lose, MD as Consulting Physician (Hematology and Oncology) [name], MD as Consulting Physician (Radiation Oncology) Rockwell Germany, RN as Registered Nurse Mauro Kaufmann, RN as Registered Nurse Holley Bouche, NP as Nurse Practitioner (Nurse Practitioner)  DIAGNOSIS: Breast cancer of upper-outer quadrant of left female breast [facility])   Staging form: Breast, AJCC 7th Edition     Clinical stage from 02/13/2014: Stage IIB (T3, N0, M0) - Unsigned       Staging comments: Staged at breast conference on 1.20.16      Pathologic stage from 03/11/2014: Stage IIB (T2, N1a, cM0) - Signed by Seward Grater, MD on 03/19/2014       Staging comments: Staged on final mastectomy specimen by Dr. Avis Epley    SUMMARY OF ONCOLOGIC HISTORY:   Breast cancer of upper-outer quadrant of left female breast ([place])   02/04/2014 Initial Diagnosis left breast: Invasive ductal carcinoma with DCIS ER 100%, PR 100%, HER-2 negative, Ki-67 ranged from 19-26%; third biopsy showed invasive mammary cancer with lobular features and intraductal papilloma, ER/PR positive HER-2 negative   02/11/2014 Breast MRI left breast: 3 masses that are confluent measuring 5.6 x 2.7 x 3 cm extends to the left aspect of area along the penis to involve skin of the area no lymph nodes detected right breast negative   03/07/2014 Surgery Left breast invasive ductal carcinoma grade 1; 4.8 cm with low-grade DCIS; margins negative 1/4 lymph nodes positive, ER 100% PR 100% HER-2 negative ratio 1.18 Ki-67 26% and 19% T2 N1 stage IIB   04/03/2014 - 08/13/2014 Chemotherapy Adjuvant chemotherapy with dose dense Adriamycin and Cytoxan followed by weekly Abraxane 12   09/09/2014 - 10/17/2014 Radiation Therapy Left chest wall 50.4 Gy at 1.8 Gy per fraction x 28 fractions Left  supraclavicular fossa and axilla/ 45 Gy at 1.8 Gy per fraction x 25 fractions   10/27/2014 -  Anti-estrogen oral therapy Anastrozole 1 mg daily    CHIEF COMPLIANT: Tolerating anastrozole very well  INTERVAL HISTORY: Theresa Huff is a 64 year old with above-mentioned history of left breast cancer treated with surgery followed by adjuvant chemoradiation and is started antiestrogen therapy with anastrozole. She tolerating it extremely well. Denies any hot flashes more than her usual. Does not have any myalgias. She has healed very well from effect of radiation.  REVIEW OF SYSTEMS:   Constitutional: Denies fevers, chills or abnormal weight loss Eyes: Denies blurriness of vision Ears, nose, mouth, throat, and face: Denies mucositis or sore throat Respiratory: Denies cough, dyspnea or wheezes Cardiovascular: Denies palpitation, chest discomfort or lower extremity swelling Gastrointestinal:  Denies nausea, heartburn or change in bowel habits Skin: Denies abnormal skin rashes Lymphatics: Denies new lymphadenopathy or easy bruising Neurological:Denies numbness, tingling or new weaknesses Behavioral/Psych: Mood is stable, no new changes   All other systems were reviewed with the patient and are negative.  I have reviewed the past medical history, past surgical history, social history and family history with the patient and they are unchanged from previous note.  ALLERGIES:  is allergic to hydrocodone; codeine; iodine; primidone; radiaplexrx; sulfa antibiotics; and tape.  MEDICATIONS:  Current Outpatient Prescriptions  Medication Sig Dispense Refill  . anastrozole (ARIMIDEX) 1 MG tablet Take 1 tablet (1 mg total) by mouth daily. (Patient not taking: Reported on 10/10/2014) 90 tablet 3  . calcium carbonate (OS-CAL) 600  MG TABS tablet Take 600 mg by mouth daily.    . cetirizine (ZYRTEC) 10 MG tablet Take 10 mg by mouth daily.      . Cholecalciferol (VITAMIN D PO) Take 2,000 Units by mouth daily.     . DULoxetine (CYMBALTA) 30 MG capsule Take 30 mg by mouth daily.     Marland Kitchen esomeprazole (NEXIUM) 20 MG packet Take 20 mg by mouth daily before breakfast.    . Eszopiclone (ESZOPICLONE) 3 MG TABS Take 3 mg by mouth at bedtime. Take immediately before bedtime    . lidocaine-prilocaine (EMLA) cream     . ondansetron (ZOFRAN) 8 MG tablet   1  . oxyCODONE (OXY IR/ROXICODONE) 5 MG immediate release tablet Take 1-2 tablets (5-10 mg total) by mouth every 4 (four) hours as needed for moderate pain. (Patient not taking: Reported on 10/10/2014) 30 tablet 0  . prochlorperazine (COMPAZINE) 10 MG tablet   1  . SUMAtriptan (IMITREX) 50 MG tablet Take 1 tablet (50 mg total) by mouth every 2 (two) hours as needed for migraine (Max of 2 tabs per 24 hour period.). 10 tablet 0  . triamcinolone cream (KENALOG) 0.1 %     . Vitamin D, Ergocalciferol, (DRISDOL) 50000 UNITS CAPS capsule Take 50,000 Units by mouth every 7 (seven) days.     No current facility-administered medications for this visit.    PHYSICAL EXAMINATION: ECOG PERFORMANCE STATUS: 1 - Symptomatic but completely ambulatory  Filed Vitals:   11/13/14 0905  BP: 117/67  Pulse: 87  Temp: 97.7 F (36.5 C)  Resp: 18   Filed Weights   11/13/14 0905  Weight: 153 lb (69.4 kg)    GENERAL:alert, no distress and comfortable SKIN: skin color, texture, turgor are normal, no rashes or significant lesions EYES: normal, Conjunctiva are pink and non-injected, sclera clear OROPHARYNX:no exudate, no erythema and lips, buccal mucosa, and tongue normal  NECK: supple, thyroid normal size, non-tender, without nodularity LYMPH:  no palpable lymphadenopathy in the cervical, axillary or inguinal LUNGS: clear to auscultation and percussion with normal breathing effort HEART: regular rate & rhythm and no murmurs and no lower extremity edema ABDOMEN:abdomen soft, non-tender and normal bowel sounds Musculoskeletal:no cyanosis of digits and no clubbing  NEURO: alert &  oriented x 3 with fluent speech, no focal motor/sensory deficits  LABORATORY DATA:  I have reviewed the data as listed   Chemistry      Component Value Date/Time   NA 142 11/13/2014 0851   NA 139 05/03/2014 1946   K 4.2 11/13/2014 0851   K 4.1 05/03/2014 1946   CL 103 05/03/2014 1946   CO2 27 11/13/2014 0851   CO2 26 05/03/2014 1946   BUN 10.7 11/13/2014 0851   BUN 17 05/03/2014 1946   CREATININE 0.8 11/13/2014 0851   CREATININE 0.77 05/03/2014 1946      Component Value Date/Time   CALCIUM 9.7 11/13/2014 0851   CALCIUM 9.6 05/03/2014 1946   ALKPHOS 74 11/13/2014 0851   ALKPHOS 76 09/14/2010 1708   AST 24 11/13/2014 0851   AST 18 09/14/2010 1708   ALT 28 11/13/2014 0851   ALT 21 09/14/2010 1708   BILITOT 0.59 11/13/2014 0851   BILITOT 0.4 09/14/2010 1708       Lab Results  Component Value Date   WBC 4.4 11/13/2014   HGB 12.8 11/13/2014   HCT 39.1 11/13/2014   MCV 92.0 11/13/2014   PLT 225 11/13/2014   NEUTROABS 2.7 11/13/2014    ASSESSMENT & PLAN:  Breast cancer of upper-outer quadrant of left female breast Left breast invasive ductal carcinoma grade 1; 4.8 cm with low-grade DCIS; margins negative 1/4 lymph nodes positive, ER 100% PR 100% HER-2 negative ratio 1.18 Ki-67 26% and 19% T2 N1 stage IIB status post adjuvant chemotherapy with dose dense Adriamycin and Cytoxan 4 followed by Abraxane weekly 12 Completed Adjuvant radiation therapy 10/17/14  Treatment plan: Anti-estrogen therapy with anastrozole 1 mg daily started 10/27/2014. Tolerating it well without any toxicities 1. Hot flashes fairly mild 2. Denies any myalgias  Patient is interested in participating in [place]   clinical trial.  [name] clinical trial counseling: Patients who have completed definitive therapy for breast cancer are randomized to antiestrogen therapy (5+ years) versus antiestrogen therapy plus Palbociclib (2 years). Palbociclib: If she was randomized to Palbociclib, I discussed the  risks and benefits of Ibrance including myelosuppression especially neutropenia and with that risk of infection, there is risk of pulmonary embolism and mild peripheral neuropathy as well. Fatigue, nausea, diarrhea, decreased appetite as well as alopecia and thrombocytopenia are also potential side effects of Palbociclib.  Skin rash on the upper back and neck: Unclear etiology , resolved RTC based on clinical trial requirement    No orders of the defined types were placed in this encounter.   The patient has a good understanding of the overall plan. she agrees with it. she will call with any problems that may develop before the next visit here.   Rulon Eisenmenger, MD 11/13/2014

## 2014-11-13 NOTE — Addendum Note (Signed)
Addended by: Jonelle Sports K on: 11/13/2014 11:16 AM   Modules accepted: Orders, Medications

## 2014-11-14 ENCOUNTER — Ambulatory Visit
Admission: RE | Admit: 2014-11-14 | Discharge: 2014-11-14 | Disposition: A | Discharge status: Home / Self Care | Payer: 59 | Source: Ambulatory Visit | Attending: Radiation Oncology | Admitting: Radiation Oncology

## 2014-11-14 ENCOUNTER — Encounter: Payer: Self-pay | Admitting: Radiation Oncology

## 2014-11-14 VITALS — BP 116/77 | HR 94 | Resp 16 | Wt 153.2 lb

## 2014-11-14 DIAGNOSIS — C50412 Malignant neoplasm of upper-outer quadrant of left female breast: Secondary | ICD-10-CM

## 2014-11-14 NOTE — Progress Notes (Signed)
Department of Radiation Oncology  Phone:  [phone] Fax:        [phone]   Name: Theresa Huff MRN: [id number]  DOB: 21-Feb-1950  Date: 11/14/2014  Follow Up Visit Note  Diagnosis: Breast cancer of upper-outer quadrant of left female breast [facility])   Staging form: Breast, AJCC 7th Edition     Clinical stage from 02/13/2014: Stage IIB (T3, N0, M0) - Unsigned       Staging comments: Staged at breast conference on 1.20.16      Pathologic stage from 03/11/2014: Stage IIB (T2, N1a, cM0) - Signed by Seward Grater, MD on 03/19/2014       Staging comments: Staged on final mastectomy specimen by Dr. Avis Epley    Summary and Interval since last radiation: The radiation treatment dates extended from 09/09/2014-10/17/2014. The site and dose included the Left chest wall 50.4 Gy at 1.8 Gy per fraction x 28 fractions and the Left supraclavicular fossa and axilla/ 45 Gy at 1.8 Gy per fraction x 25 fractions. The beams and energy included Opposed tangents with reduced fields at 6 MV photons and RAO/PA 6/10 MV photons.  Interval History: Theresa Huff presents today for routine followup. Her weight and vitals are currently stable. She denies symptoms of pain, but reports edema in left axilla. She reports performing massage therapy to evacuate fluid in axilla every other day and administering Anastrozole as directed. She had no side effects from this. The patient is scheduled to have port removed November 8th of 2016. She uses Jergans with vitamin e lotion daily to area of treatment. She reports her energy level is slowly improving. She was excited about her "knitted knockers" that her friend made her! She was seen by medical oncology on 11/13/2014 to evaluate if she was eligible for a trial.   Physical Exam:  Filed Vitals:   11/14/14 1554  BP: 116/77  Pulse: 94  Resp: 16  Weight: 153 lb 3.2 oz (69.491 kg)  SpO2: 100%  The patient was alert and oriented x3. There is no significant changes to the status  of overall health to be noted at this time. Breast: Pink left chest wall with skin that is healing well. No sign of infection.  IMPRESSION: Theresa Huff is a 64 y.o. female presenting to clinic in regards to her breast cancer of upper-outer quadrant of left female breast ([place]). The patient is recovering from the effects of radiation and understands the importance of annual mammograms. She also understands the importance of using sun protection and the benefits of administering vitamin E lotion to her area of treatment. She is aware that she may access her appointments and medical records via [place].   PLAN: She is doing well. We discussed the need for yearly mammograms which she can schedule with her OBGYN or with medical oncology. We discussed the need for sun protection in the treated area. She will continue to administer vitamin E lotion to her area of treatment. She will attend survivorship in December of 2016 and follow-up with medical oncology for the results of whether or not she is eligible for a trail. She is encouraged to continue the use of her knitted knockers as she feels fit. All vocalized questions and concerns have been addressed. If the patient develops any further questions or concerns in regards to her treatment and recovery, she has been encouraged to contact Dr. [name], MD. She is aware of her follow-up appointment with radiation oncology to take place in one month, as scheduled.  This document serves as a record of services personally performed by [name] , MD. It was created on her behalf by Lenn Cal, a trained medical scribe. The creation of this record is based on the scribe's personal observations and the provider's statements to them. This document has been checked and approved by the attending provider.  ------------------------------------------------  [name], MD

## 2014-11-14 NOTE — Progress Notes (Signed)
Weight and vitals stable. Denies pain. Patient reports edema in left axilla. Reports performing massage therapy to evacuate fluid in axilla every other day. Reports taking anastrozole as directed. Scheduled to have port removed November 8th. Hyperpigmentation without desquamation of left chest wall noted. Reports she now uses Jergans with vitamin e daily. Reports her energy level is slowly improving.   BP 116/77 mmHg  Pulse 94  Resp 16  Wt 153 lb 3.2 oz (69.491 kg)  SpO2 100% Wt Readings from Last 3 Encounters:  11/14/14 153 lb 3.2 oz (69.491 kg)  11/13/14 153 lb (69.4 kg)  10/15/14 155 lb 1.6 oz (70.353 kg)

## 2014-11-25 ENCOUNTER — Ambulatory Visit: Payer: 59 | Admitting: Hematology and Oncology

## 2014-11-25 ENCOUNTER — Other Ambulatory Visit: Payer: Self-pay | Admitting: Medical Oncology

## 2014-11-25 ENCOUNTER — Other Ambulatory Visit: Payer: 59

## 2014-11-25 DIAGNOSIS — C50412 Malignant neoplasm of upper-outer quadrant of left female breast: Secondary | ICD-10-CM

## 2014-11-26 ENCOUNTER — Telehealth: Payer: Self-pay | Admitting: Hematology and Oncology

## 2014-11-26 NOTE — Telephone Encounter (Signed)
Called and left a message with 11/10 appointment

## 2014-11-28 ENCOUNTER — Encounter (HOSPITAL_BASED_OUTPATIENT_CLINIC_OR_DEPARTMENT_OTHER): Payer: Self-pay | Admitting: *Deleted

## 2014-12-02 ENCOUNTER — Other Ambulatory Visit: Payer: Self-pay | Admitting: General Surgery

## 2014-12-04 ENCOUNTER — Ambulatory Visit (HOSPITAL_BASED_OUTPATIENT_CLINIC_OR_DEPARTMENT_OTHER): Payer: 59 | Admitting: Anesthesiology

## 2014-12-04 ENCOUNTER — Encounter (HOSPITAL_BASED_OUTPATIENT_CLINIC_OR_DEPARTMENT_OTHER)
Admission: RE | Disposition: A | Discharge status: Home / Self Care | Payer: Self-pay | Source: Ambulatory Visit | Attending: General Surgery

## 2014-12-04 ENCOUNTER — Ambulatory Visit (HOSPITAL_BASED_OUTPATIENT_CLINIC_OR_DEPARTMENT_OTHER)
Admission: RE | Admit: 2014-12-04 | Discharge: 2014-12-04 | Disposition: A | Discharge status: Home / Self Care | Payer: 59 | Source: Ambulatory Visit | Attending: General Surgery | Admitting: General Surgery

## 2014-12-04 ENCOUNTER — Encounter (HOSPITAL_BASED_OUTPATIENT_CLINIC_OR_DEPARTMENT_OTHER): Payer: Self-pay | Admitting: *Deleted

## 2014-12-04 DIAGNOSIS — Z79899 Other long term (current) drug therapy: Secondary | ICD-10-CM | POA: Insufficient documentation

## 2014-12-04 DIAGNOSIS — G2581 Restless legs syndrome: Secondary | ICD-10-CM | POA: Insufficient documentation

## 2014-12-04 DIAGNOSIS — Z9012 Acquired absence of left breast and nipple: Secondary | ICD-10-CM | POA: Insufficient documentation

## 2014-12-04 DIAGNOSIS — Z885 Allergy status to narcotic agent status: Secondary | ICD-10-CM | POA: Insufficient documentation

## 2014-12-04 DIAGNOSIS — Z882 Allergy status to sulfonamides status: Secondary | ICD-10-CM | POA: Diagnosis not present

## 2014-12-04 DIAGNOSIS — Z9221 Personal history of antineoplastic chemotherapy: Secondary | ICD-10-CM | POA: Insufficient documentation

## 2014-12-04 DIAGNOSIS — Z888 Allergy status to other drugs, medicaments and biological substances status: Secondary | ICD-10-CM | POA: Insufficient documentation

## 2014-12-04 DIAGNOSIS — Z85828 Personal history of other malignant neoplasm of skin: Secondary | ICD-10-CM | POA: Diagnosis not present

## 2014-12-04 DIAGNOSIS — Z452 Encounter for adjustment and management of vascular access device: Secondary | ICD-10-CM | POA: Insufficient documentation

## 2014-12-04 DIAGNOSIS — Z87891 Personal history of nicotine dependence: Secondary | ICD-10-CM | POA: Diagnosis not present

## 2014-12-04 DIAGNOSIS — Z91041 Radiographic dye allergy status: Secondary | ICD-10-CM | POA: Diagnosis not present

## 2014-12-04 DIAGNOSIS — Z853 Personal history of malignant neoplasm of breast: Secondary | ICD-10-CM | POA: Insufficient documentation

## 2014-12-04 DIAGNOSIS — F329 Major depressive disorder, single episode, unspecified: Secondary | ICD-10-CM | POA: Diagnosis not present

## 2014-12-04 DIAGNOSIS — K219 Gastro-esophageal reflux disease without esophagitis: Secondary | ICD-10-CM | POA: Insufficient documentation

## 2014-12-04 HISTORY — PX: PORT-A-CATH REMOVAL: SHX5289

## 2014-12-04 SURGERY — REMOVAL PORT-A-CATH
Anesthesia: Monitor Anesthesia Care

## 2014-12-04 MED ORDER — LIDOCAINE HCL (CARDIAC) 20 MG/ML IV SOLN
INTRAVENOUS | Status: AC
  Filled 2014-12-04: qty 5

## 2014-12-04 MED ORDER — LIDOCAINE-EPINEPHRINE (PF) 1 %-1:200000 IJ SOLN
INTRAMUSCULAR | Status: AC
  Filled 2014-12-04: qty 30

## 2014-12-04 MED ORDER — DEXAMETHASONE SODIUM PHOSPHATE 10 MG/ML IJ SOLN
INTRAMUSCULAR | Status: DC | PRN
  Administered 2014-12-04: 10 mg via INTRAVENOUS

## 2014-12-04 MED ORDER — MIDAZOLAM HCL 5 MG/5ML IJ SOLN
INTRAMUSCULAR | Status: DC | PRN
  Administered 2014-12-04: 2 mg via INTRAVENOUS

## 2014-12-04 MED ORDER — ONDANSETRON HCL 4 MG/2ML IJ SOLN
INTRAMUSCULAR | Status: DC | PRN
  Administered 2014-12-04: 4 mg via INTRAVENOUS

## 2014-12-04 MED ORDER — ONDANSETRON HCL 4 MG/2ML IJ SOLN
INTRAMUSCULAR | Status: AC
  Filled 2014-12-04: qty 2

## 2014-12-04 MED ORDER — ONDANSETRON HCL 4 MG/2ML IJ SOLN: 4.0000 mg | Freq: Four times a day (QID) | INTRAMUSCULAR | Status: DC | PRN

## 2014-12-04 MED ORDER — LIDOCAINE HCL (CARDIAC) 20 MG/ML IV SOLN
INTRAVENOUS | Status: DC | PRN
  Administered 2014-12-04: 50 mg via INTRAVENOUS

## 2014-12-04 MED ORDER — FENTANYL CITRATE (PF) 100 MCG/2ML IJ SOLN
INTRAMUSCULAR | Status: AC
  Filled 2014-12-04: qty 4

## 2014-12-04 MED ORDER — FENTANYL CITRATE (PF) 100 MCG/2ML IJ SOLN
INTRAMUSCULAR | Status: DC | PRN
  Administered 2014-12-04: 50 ug via INTRAVENOUS

## 2014-12-04 MED ORDER — GLYCOPYRROLATE 0.2 MG/ML IJ SOLN: 0.2000 mg | Freq: Once | INTRAMUSCULAR | Status: DC | PRN

## 2014-12-04 MED ORDER — LIDOCAINE-EPINEPHRINE (PF) 1 %-1:200000 IJ SOLN
INTRAMUSCULAR | Status: DC | PRN
  Administered 2014-12-04: 5 mL

## 2014-12-04 MED ORDER — LACTATED RINGERS IV SOLN
INTRAVENOUS | Status: DC | PRN
  Administered 2014-12-04: 09:00:00 via INTRAVENOUS

## 2014-12-04 MED ORDER — FENTANYL CITRATE (PF) 100 MCG/2ML IJ SOLN: 25.0000 ug | INTRAMUSCULAR | Status: DC | PRN

## 2014-12-04 MED ORDER — BUPIVACAINE HCL (PF) 0.25 % IJ SOLN
INTRAMUSCULAR | Status: AC
  Filled 2014-12-04: qty 30

## 2014-12-04 MED ORDER — BUPIVACAINE HCL (PF) 0.25 % IJ SOLN
INTRAMUSCULAR | Status: DC | PRN
  Administered 2014-12-04: 5 mL

## 2014-12-04 MED ORDER — PROPOFOL 500 MG/50ML IV EMUL
INTRAVENOUS | Status: AC
  Filled 2014-12-04: qty 50

## 2014-12-04 MED ORDER — PROPOFOL 500 MG/50ML IV EMUL
INTRAVENOUS | Status: DC | PRN
  Administered 2014-12-04: 25 ug/kg/min via INTRAVENOUS

## 2014-12-04 MED ORDER — DEXAMETHASONE SODIUM PHOSPHATE 10 MG/ML IJ SOLN
INTRAMUSCULAR | Status: AC
  Filled 2014-12-04: qty 1

## 2014-12-04 MED ORDER — SCOPOLAMINE 1 MG/3DAYS TD PT72: 1.0000 | MEDICATED_PATCH | Freq: Once | TRANSDERMAL | Status: DC | PRN

## 2014-12-04 MED ORDER — MIDAZOLAM HCL 2 MG/2ML IJ SOLN
INTRAMUSCULAR | Status: AC
  Filled 2014-12-04: qty 4

## 2014-12-04 SURGICAL SUPPLY — 32 items
BLADE SURG 15 STRL LF DISP TIS (BLADE) ×1 IMPLANT
BLADE SURG 15 STRL SS (BLADE) ×2
CHLORAPREP W/TINT 26ML (MISCELLANEOUS) ×2 IMPLANT
COVER BACK TABLE 60X90IN (DRAPES) ×2 IMPLANT
COVER MAYO STAND STRL (DRAPES) ×2 IMPLANT
DECANTER SPIKE VIAL GLASS SM (MISCELLANEOUS) ×2 IMPLANT
DRAPE LAPAROTOMY 100X72 PEDS (DRAPES) ×2 IMPLANT
ELECT COATED BLADE 2.86 ST (ELECTRODE) ×2 IMPLANT
ELECT REM PT RETURN 9FT ADLT (ELECTROSURGICAL) ×2
ELECTRODE REM PT RTRN 9FT ADLT (ELECTROSURGICAL) ×1 IMPLANT
GLOVE BIO SURGEON STRL SZ 6.5 (GLOVE) ×1 IMPLANT
GLOVE BIO SURGEON STRL SZ7 (GLOVE) ×2 IMPLANT
GLOVE BIOGEL PI IND STRL 7.5 (GLOVE) ×1 IMPLANT
GLOVE BIOGEL PI INDICATOR 7.5 (GLOVE) ×2
GLOVE ECLIPSE 7.0 STRL STRAW (GLOVE) ×2 IMPLANT
GLOVE SURG SS PI 7.0 STRL IVOR (GLOVE) ×1 IMPLANT
GOWN STRL REUS W/ TWL LRG LVL3 (GOWN DISPOSABLE) ×3 IMPLANT
GOWN STRL REUS W/TWL LRG LVL3 (GOWN DISPOSABLE) ×6
LIQUID BAND (GAUZE/BANDAGES/DRESSINGS) ×2 IMPLANT
MARKER SKIN DUAL TIP RULER LAB (MISCELLANEOUS) ×2 IMPLANT
NDL HYPO 25X1 1.5 SAFETY (NEEDLE) ×1 IMPLANT
NEEDLE HYPO 25X1 1.5 SAFETY (NEEDLE) ×2 IMPLANT
PACK BASIN DAY SURGERY FS (CUSTOM PROCEDURE TRAY) ×2 IMPLANT
PENCIL BUTTON HOLSTER BLD 10FT (ELECTRODE) ×2 IMPLANT
SLEEVE SCD COMPRESS KNEE MED (MISCELLANEOUS) IMPLANT
SUT MNCRL AB 4-0 PS2 18 (SUTURE) ×2 IMPLANT
SUT MON AB 4-0 PC3 18 (SUTURE) ×2 IMPLANT
SUT VIC AB 3-0 SH 27 (SUTURE) ×2
SUT VIC AB 3-0 SH 27X BRD (SUTURE) ×1 IMPLANT
SYR CONTROL 10ML LL (SYRINGE) ×2 IMPLANT
TOWEL OR 17X24 6PK STRL BLUE (TOWEL DISPOSABLE) ×2 IMPLANT
TOWEL OR NON WOVEN STRL DISP B (DISPOSABLE) ×2 IMPLANT

## 2014-12-04 NOTE — Anesthesia Postprocedure Evaluation (Signed)
Anesthesia Post Note  Patient: Theresa Huff  Procedure(s) Performed: Procedure(s) (LRB): REMOVAL PORT-A-CATH (N/A)  Anesthesia type: MAC  Patient location: PACU  Post pain: Pain level controlled and Adequate analgesia  Post assessment: Post-op Vital signs reviewed, Patient's Cardiovascular Status Stable and Respiratory Function Stable  Last Vitals:  Filed Vitals:   12/04/14 0945  BP: 116/78  Pulse: 64  Temp: 36.8 C  Resp: 16    Post vital signs: Reviewed and stable  Level of consciousness: awake, alert  and oriented  Complications: No apparent anesthesia complications

## 2014-12-04 NOTE — Op Note (Signed)
Preoperative diagnosis: breast cancer, no longer needs venous access Postoperative diagnosis: same as above Procedure: port removal Surgeon: Dr Serita Grammes EBL: minimal Anesthesia local mac Specimens none Drains none Complications none Sponge count correct dispo to pacu stable  Indications: This is a 63 yof presents for port removal after undergoing chemotherapy for breast cancer.  Procedure: After informed consent was obtained she was taken to the or. She was placed under monitored anesthesia care. She was prepped and draped in the standard sterile surgical fashion. Timeout was performed.  I infiltrated marcaine and lidocaine in region of port and old scar. I reentered the old incision. The port and line were removed in their entirety. Hemostasis was observed. I then closed with 3-0 vicryl, 4-0 monocryl and glue. She tolerated well and case was turned to recovery in stable condition

## 2014-12-04 NOTE — Anesthesia Preprocedure Evaluation (Signed)
Anesthesia Evaluation  Patient identified by MRN, date of birth, ID band Patient awake    Reviewed: Allergy & Precautions, NPO status , Patient's Chart, lab work & pertinent test results  Airway Mallampati: II   Neck ROM: full    Dental   Pulmonary asthma , sleep apnea , former smoker,    breath sounds clear to auscultation       Cardiovascular negative cardio ROS   Rhythm:regular Rate:Normal     Neuro/Psych  Headaches, Depression    GI/Hepatic GERD  ,  Endo/Other    Renal/GU      Musculoskeletal   Abdominal   Peds  Hematology   Anesthesia Other Findings   Reproductive/Obstetrics                             Anesthesia Physical Anesthesia Plan  ASA: II  Anesthesia Plan: MAC   Post-op Pain Management:    Induction: Intravenous  Airway Management Planned: Simple Face Mask  Additional Equipment:   Intra-op Plan:   Post-operative Plan:   Informed Consent: I have reviewed the patients History and Physical, chart, labs and discussed the procedure including the risks, benefits and alternatives for the proposed anesthesia with the patient or authorized representative who has indicated his/her understanding and acceptance.     Plan Discussed with: CRNA, Anesthesiologist and Surgeon  Anesthesia Plan Comments:         Anesthesia Quick Evaluation

## 2014-12-04 NOTE — Discharge Instructions (Signed)
POST OP INSTRUCTIONS  Always review your discharge instruction sheet given to you by the facility where your surgery was performed.   1. A prescription for pain medication may be given to you upon discharge. Take your pain medication as prescribed, if needed. If narcotic pain medicine is not needed, then you make take acetaminophen (Tylenol) or ibuprofen (Advil) as needed.  2. Take your usually prescribed medications unless otherwise directed. 3. If you need a refill on your pain medication, please contact our office. All narcotic pain medicine now requires a paper prescription.  Phoned in and fax refills are no longer allowed by law.  Prescriptions will not be filled after 5 pm or on weekends.  4. You should follow a light diet for the remainder of the day after your procedure. 5. Most patients will experience some mild swelling and/or bruising in the area of the incision. It may take several days to resolve. 6. It is common to experience some constipation if taking pain medication after surgery. Increasing fluid intake and taking a stool softener (such as Colace) will usually help or prevent this problem from occurring. A mild laxative (Milk of Magnesia or Miralax) should be taken according to package directions if there are no bowel movements after 48 hours.  7. Unless discharge instructions indicate otherwise, you may remove your bandages 48 hours after surgery, and you may shower at that time. You may have steri-strips (small white skin tapes) in place directly over the incision.  These strips should be left on the skin for 7-10 days.  If your surgeon used Dermabond (skin glue) on the incision, you may shower in 24 hours.  The glue will flake off over the next 2-3 weeks.   8. ACTIVITIES:  Resume normal activity tomorrow 10.You may need to see your doctor in the office for a follow-up appointment.  Please       check with your doctor. .  WHEN TO CALL YOUR DOCTOR [phone]): 1. Fever  over 101.0 2. Chills 3. Continued bleeding from incision 4. Increased redness and tenderness at the site 5. Shortness of breath, difficulty breathing   The clinic staff is available to answer your questions during regular business hours. Please dont hesitate to call and ask to speak to one of the nurses or medical assistants for clinical concerns. If you have a medical emergency, go to the nearest emergency room or call 911.  A surgeon from [facility] is always on call at the hospital.     For further information, please visit [email or web address]   Post Anesthesia Home Care Instructions  Activity: Get plenty of rest for the remainder of the day. A responsible adult should stay with you for 24 hours following the procedure.  For the next 24 hours, DO NOT: -Drive a car -Paediatric nurse -Drink alcoholic beverages -Take any medication unless instructed by your physician -Make any legal decisions or sign important papers.  Meals: Start with liquid foods such as gelatin or soup. Progress to regular foods as tolerated. Avoid greasy, spicy, heavy foods. If nausea and/or vomiting occur, drink only clear liquids until the nausea and/or vomiting subsides. Call your physician if vomiting continues.  Special Instructions/Symptoms: Your throat may feel dry or sore from the anesthesia or the breathing tube placed in your throat during surgery. If this causes discomfort, gargle with warm salt water. The discomfort should disappear within 24 hours.  If you had a scopolamine patch placed behind your ear for the management of  post- operative nausea and/or vomiting:  1. The medication in the patch is effective for 72 hours, after which it should be removed.  Wrap patch in a tissue and discard in the trash. Wash hands thoroughly with soap and water. 2. You may remove the patch earlier than 72 hours if you experience unpleasant side effects which may include dry mouth,  dizziness or visual disturbances. 3. Avoid touching the patch. Wash your hands with soap and water after contact with the patch.

## 2014-12-04 NOTE — Transfer of Care (Signed)
Immediate Anesthesia Transfer of Care Note  Patient: Theresa Huff  Procedure(s) Performed: Procedure(s): REMOVAL PORT-A-CATH (N/A)  Patient Location: PACU  Anesthesia Type:MAC  Level of Consciousness: awake  Airway & Oxygen Therapy: Patient Spontanous Breathing and Patient connected to face mask oxygen  Post-op Assessment: Report given to RN and Post -op Vital signs reviewed and stable  Post vital signs: Reviewed and stable  Last Vitals:  Filed Vitals:   12/04/14 0729  BP: 98/73  Pulse: 61  Temp: 36.5 C  Resp: 16    Complications: No apparent anesthesia complications

## 2014-12-04 NOTE — H&P (Signed)
Theresa Huff is an 64 y.o. female.   Chief Complaint: breast cancer HPI:  68 yof s/p left total mastectomy and left ax sn biopsy who has now completed chemotherapy.    Past Medical History  Diagnosis Date  . Cough   . Weight gain   . Hot flashes   . Insomnia, unspecified   . Depressive disorder, not elsewhere classified   . Allergic rhinitis due to pollen   . Plantar fasciitis   . Restless leg syndrome 09/09/2010  . Breast cancer ([place])   . Skin cancer   . GERD (gastroesophageal reflux disease)   . Wears glasses   . Anemia     with pregnancy  . Radiation 09/09/14-10/17/14    Left chestwall/supraclav.    Past Surgical History  Procedure Laterality Date  . Tonsillectomy    . Melanoma excision      right leg, left arm, right arm (several from each area)  . Colonoscopy    . Mastectomy w/ sentinel node biopsy Left 03/07/2014    Procedure: LEFT TOTAL MASTECTOMY WITH LEFT SENTINEL LYMPH NODE BIOPSY;  Surgeon: Rolm Bookbinder, MD;  Location: [place];  Service: General;  Laterality: Left;  . Portacath placement Right 04/01/2014    Procedure: INSERTION PORT-A-CATH;  Surgeon: Rolm Bookbinder, MD;  Location: [facility] OR;  Service: General;  Laterality: Right;    Family History  Problem Relation Age of Onset  . Alcohol abuse Father   . Emphysema Mother   . Heart disease Mother   . Heart failure Mother   . Asthma Daughter   . Asthma Other     grandson  . Asthma Sister    Social History:  reports that she quit smoking about 40 years ago. Her smoking use included Cigarettes. She has a 1.2 pack-year smoking history. She has never used smokeless tobacco. She reports that she drinks alcohol. She reports that she does not use illicit drugs.  Allergies:  Allergies  Allergen Reactions  . Hydrocodone Nausea And Vomiting  . Codeine Nausea And Vomiting  . Iodine Hives  . Primidone Itching and Nausea And Vomiting  . Radiaplexrx [Skin Protectants, Misc.] Other (See Comments)     Contraindicated on pharmacy profile  . Sulfa Antibiotics Hives  . Tape Other (See Comments)    Redness, skin tear    Medications Prior to Admission  Medication Sig Dispense Refill  . anastrozole (ARIMIDEX) 1 MG tablet Take 1 tablet (1 mg total) by mouth daily. 90 tablet 3  . calcium carbonate (OS-CAL) 600 MG TABS tablet Take 600 mg by mouth daily.    . cetirizine (ZYRTEC) 10 MG tablet Take 10 mg by mouth daily.      . Cholecalciferol (VITAMIN D PO) Take 2,000 Units by mouth daily.    . clindamycin (CLEOCIN T) 1 % lotion APPLY TO AFFECTED AREA EVERY DAY  3  . DULoxetine (CYMBALTA) 30 MG capsule Take 30 mg by mouth daily.     Marland Kitchen esomeprazole (NEXIUM) 20 MG packet Take 20 mg by mouth daily before breakfast.    . Eszopiclone (ESZOPICLONE) 3 MG TABS Take 3 mg by mouth at bedtime. Take immediately before bedtime    . omeprazole (PRILOSEC) 40 MG capsule Take 40 mg by mouth daily.  5  . Vitamin D, Ergocalciferol, (DRISDOL) 50000 UNITS CAPS capsule Take 50,000 Units by mouth every 7 (seven) days.    . SUMAtriptan (IMITREX) 50 MG tablet Take 1 tablet (50 mg total) by mouth every 2 (two)  hours as needed for migraine (Max of 2 tabs per 24 hour period.). 10 tablet 0  . triamcinolone cream (KENALOG) 0.1 %       No results found for this or any previous visit (from the past 48 hour(s)). No results found.  Review of Systems  Constitutional: Negative for fever and chills.    Blood pressure 98/73, pulse 61, temperature 97.7 F (36.5 C), temperature source Oral, resp. rate 16, height 5\' 5"  (1.651 m), weight 68.947 kg (152 lb), SpO2 98 %. Physical Exam  Vitals reviewed. Constitutional: She appears well-developed and well-nourished.  Respiratory:  Port in place     Assessment/Plan No longer needs venous access  Port removal today  Theresa Huff 12/04/2014, 8:02 AM

## 2014-12-04 NOTE — Interval H&P Note (Signed)
History and Physical Interval Note:  12/04/2014 8:03 AM  Theresa Huff  has presented today for surgery, with the diagnosis of breast cancer  The various methods of treatment have been discussed with the patient and family. After consideration of risks, benefits and other options for treatment, the patient has consented to  Procedure(s): REMOVAL PORT-A-CATH (N/A) as a surgical intervention .  The patient's history has been reviewed, patient examined, no change in status, stable for surgery.  I have reviewed the patient's chart and labs.  Questions were answered to the patient's satisfaction.     Karilynn Carranza

## 2014-12-05 ENCOUNTER — Encounter (HOSPITAL_BASED_OUTPATIENT_CLINIC_OR_DEPARTMENT_OTHER): Payer: Self-pay | Admitting: General Surgery

## 2014-12-05 ENCOUNTER — Ambulatory Visit (HOSPITAL_BASED_OUTPATIENT_CLINIC_OR_DEPARTMENT_OTHER): Payer: 59 | Admitting: Hematology and Oncology

## 2014-12-05 ENCOUNTER — Other Ambulatory Visit (HOSPITAL_BASED_OUTPATIENT_CLINIC_OR_DEPARTMENT_OTHER): Payer: 59

## 2014-12-05 VITALS — BP 129/62 | HR 79 | Temp 97.7°F | Resp 18 | Ht 65.0 in | Wt 154.5 lb

## 2014-12-05 DIAGNOSIS — Z006 Encounter for examination for normal comparison and control in clinical research program: Secondary | ICD-10-CM

## 2014-12-05 DIAGNOSIS — Z170 Estrogen receptor positive status [ER+]: Secondary | ICD-10-CM

## 2014-12-05 DIAGNOSIS — Z9221 Personal history of antineoplastic chemotherapy: Secondary | ICD-10-CM

## 2014-12-05 DIAGNOSIS — Z9012 Acquired absence of left breast and nipple: Secondary | ICD-10-CM

## 2014-12-05 DIAGNOSIS — C50412 Malignant neoplasm of upper-outer quadrant of left female breast: Secondary | ICD-10-CM

## 2014-12-05 DIAGNOSIS — Z79811 Long term (current) use of aromatase inhibitors: Secondary | ICD-10-CM | POA: Diagnosis not present

## 2014-12-05 DIAGNOSIS — Z923 Personal history of irradiation: Secondary | ICD-10-CM

## 2014-12-05 LAB — CBC WITH DIFFERENTIAL/PLATELET
BASO%: 0.1 % (ref 0.0–2.0)
Basophils Absolute: 0 10*3/uL (ref 0.0–0.1)
EOS%: 0 % (ref 0.0–7.0)
Eosinophils Absolute: 0 10*3/uL (ref 0.0–0.5)
HCT: 40.9 % (ref 34.8–46.6)
HEMOGLOBIN: 13.4 g/dL (ref 11.6–15.9)
LYMPH%: 15.9 % (ref 14.0–49.7)
MCH: 30.3 pg (ref 25.1–34.0)
MCHC: 32.8 g/dL (ref 31.5–36.0)
MCV: 92.5 fL (ref 79.5–101.0)
MONO#: 1 10*3/uL — ABNORMAL HIGH (ref 0.1–0.9)
MONO%: 7.2 % (ref 0.0–14.0)
NEUT%: 76.8 % (ref 38.4–76.8)
NEUTROS ABS: 10.3 10*3/uL — AB (ref 1.5–6.5)
Platelets: 264 10*3/uL (ref 145–400)
RBC: 4.42 10*6/uL (ref 3.70–5.45)
RDW: 14.5 % (ref 11.2–14.5)
WBC: 13.4 10*3/uL — AB (ref 3.9–10.3)
lymph#: 2.1 10*3/uL (ref 0.9–3.3)

## 2014-12-05 LAB — COMPREHENSIVE METABOLIC PANEL (CC13)
ALT: 18 U/L (ref 0–55)
AST: 14 U/L (ref 5–34)
Albumin: 4 g/dL (ref 3.5–5.0)
Alkaline Phosphatase: 75 U/L (ref 40–150)
Anion Gap: 11 mEq/L (ref 3–11)
BUN: 10.9 mg/dL (ref 7.0–26.0)
CALCIUM: 10.4 mg/dL (ref 8.4–10.4)
CHLORIDE: 108 meq/L (ref 98–109)
CO2: 26 mEq/L (ref 22–29)
Creatinine: 0.8 mg/dL (ref 0.6–1.1)
EGFR: 74 mL/min/{1.73_m2} — ABNORMAL LOW (ref >90)
Glucose: 100 mg/dl (ref 70–140)
POTASSIUM: 4.6 meq/L (ref 3.5–5.1)
SODIUM: 144 meq/L (ref 136–145)
Total Bilirubin: 0.33 mg/dL (ref 0.20–1.20)
Total Protein: 6.9 g/dL (ref 6.4–8.3)

## 2014-12-05 LAB — HEMOGLOBIN A1C
HEMOGLOBIN A1C: 5.5 % (ref <5.7)
MEAN PLASMA GLUCOSE: 111 mg/dL (ref <117)

## 2014-12-05 LAB — RESEARCH LABS

## 2014-12-05 NOTE — Assessment & Plan Note (Signed)
Left breast invasive ductal carcinoma grade 1; 4.8 cm with low-grade DCIS; margins negative 1/4 lymph nodes positive, ER 100% PR 100% HER-2 negative ratio 1.18 Ki-67 26% and 19% T2 N1 stage IIB status post adjuvant chemotherapy with dose dense Adriamycin and Cytoxan 4 followed by Abraxane weekly 12 Completed Adjuvant radiation therapy 10/17/14  Treatment plan: Anti-estrogen therapy with anastrozole 1 mg daily started 10/27/2014. Tolerating it well without any toxicities 1. Hot flashes fairly mild 2. Denies any myalgias  Patient is interested in participating in [place] trial.  Patient with clinical trials coordinator.  Return to clinic based upon clinical trial requirement

## 2014-12-05 NOTE — Progress Notes (Signed)
[facility] NOTE  Patient Care Team: Antony Contras, MD as PCP - General (Family Medicine) Elsie Stain, MD (Pulmonary Disease) Rolm Bookbinder, MD as Consulting Physician (General Surgery) Nicholas Lose, MD as Consulting Physician (Hematology and Oncology) [name], MD as Consulting Physician (Radiation Oncology) Rockwell Germany, RN as Registered Nurse Mauro Kaufmann, RN as Registered Nurse Holley Bouche, NP as Nurse Practitioner (Nurse Practitioner)  CHIEF COMPLAINTS/PURPOSE OF CONSULTATION:  [name] clinical trial enrollment  HISTORY OF PRESENTING ILLNESS:  Theresa Huff 64 y.o. female with a history of left breast cancer status post mastectomy followed by adjuvant chemotherapy and radiation. She is tolerating anastrozole extremely well that was started 10/27/2014. Very occasional hot flashes. She spent some time in [place] along with her husband and just returned back and had wonderful memories experiences of her travels. She is completely healed and recovered from the effects of prior surgery, chemotherapy and radiation.  I reviewed her records extensively and collaborated the history with the patient.  SUMMARY OF ONCOLOGIC HISTORY:   Breast cancer of upper-outer quadrant of left female breast ([place])   02/04/2014 Initial Diagnosis left breast: Invasive ductal carcinoma with DCIS ER 100%, PR 100%, HER-2 negative, Ki-67 ranged from 19-26%; third biopsy showed invasive mammary cancer with lobular features and intraductal papilloma, ER/PR positive HER-2 negative   02/11/2014 Breast MRI left breast: 3 masses that are confluent measuring 5.6 x 2.7 x 3 cm extends to the left aspect of area along the penis to involve skin of the area no lymph nodes detected right breast negative   03/07/2014 Surgery Left mastectomy: invasive ductal carcinoma grade 1; 4.8 cm with low-grade DCIS; margins negative 1/4 lymph nodes positive, ER 100% PR 100% HER-2 negative ratio 1.18  Ki-67 26% and 19% T2 N1 stage IIB   04/03/2014 - 08/13/2014 Chemotherapy Adjuvant chemotherapy with dose dense Adriamycin and Cytoxan followed by weekly Abraxane 12   09/09/2014 - 10/17/2014 Radiation Therapy Left chest wall 50.4 Gy at 1.8 Gy per fraction x 28 fractions Left supraclavicular fossa and axilla/ 45 Gy at 1.8 Gy per fraction x 25 fractions   10/27/2014 -  Anti-estrogen oral therapy Anastrozole 1 mg daily   MEDICAL HISTORY:  Past Medical History  Diagnosis Date  . Cough   . Weight gain   . Hot flashes   . Insomnia, unspecified   . Depressive disorder, not elsewhere classified   . Allergic rhinitis due to pollen   . Plantar fasciitis   . Restless leg syndrome 09/09/2010  . Breast cancer ([place])   . Skin cancer   . GERD (gastroesophageal reflux disease)   . Wears glasses   . Anemia     with pregnancy  . Radiation 09/09/14-10/17/14    Left chestwall/supraclav.    SURGICAL HISTORY: Past Surgical History  Procedure Laterality Date  . Tonsillectomy    . Melanoma excision      right leg, left arm, right arm (several from each area)  . Colonoscopy    . Mastectomy w/ sentinel node biopsy Left 03/07/2014    Procedure: LEFT TOTAL MASTECTOMY WITH LEFT SENTINEL LYMPH NODE BIOPSY;  Surgeon: Rolm Bookbinder, MD;  Location: [facility];  Service: General;  Laterality: Left;  . Portacath placement Right 04/01/2014    Procedure: INSERTION PORT-A-CATH;  Surgeon: Rolm Bookbinder, MD;  Location: [facility];  Service: General;  Laterality: Right;  . Port-a-cath removal N/A 12/04/2014    Procedure: REMOVAL PORT-A-CATH;  Surgeon: Rolm Bookbinder, MD;  Location: [facility];  Service: General;  Laterality: N/A;    SOCIAL HISTORY: Social History   Social History  . Marital Status: Married    Spouse Name: N/A  . Number of Children: 2  . Years of Education: N/A   Occupational History  . Scientist, water quality    Social History Main Topics  . Smoking status: Former  Smoker -- 0.20 packs/day for 6 years    Types: Cigarettes    Quit date: 01/25/1974  . Smokeless tobacco: Never Used  . Alcohol Use: 0.0 oz/week    0 Standard drinks or equivalent per week     Comment: 5 glasses per month  . Drug Use: No  . Sexual Activity: Not on file   Other Topics Concern  . Not on file   Social History Narrative    FAMILY HISTORY: Family History  Problem Relation Age of Onset  . Alcohol abuse Father   . Emphysema Mother   . Heart disease Mother   . Heart failure Mother   . Asthma Daughter   . Asthma Other     grandson  . Asthma Sister     ALLERGIES:  is allergic to hydrocodone; codeine; iodine; primidone; radiaplexrx; sulfa antibiotics; and tape.  MEDICATIONS:  Current Outpatient Prescriptions  Medication Sig Dispense Refill  . anastrozole (ARIMIDEX) 1 MG tablet Take 1 tablet (1 mg total) by mouth daily. 90 tablet 3  . calcium carbonate (OS-CAL) 600 MG TABS tablet Take 600 mg by mouth daily.    . cetirizine (ZYRTEC) 10 MG tablet Take 10 mg by mouth daily.      . Cholecalciferol (VITAMIN D PO) Take 2,000 Units by mouth daily.    . clindamycin (CLEOCIN T) 1 % lotion APPLY TO AFFECTED AREA EVERY DAY  3  . DULoxetine (CYMBALTA) 30 MG capsule Take 30 mg by mouth daily.     Marland Kitchen esomeprazole (NEXIUM) 20 MG packet Take 20 mg by mouth daily before breakfast.    . Eszopiclone (ESZOPICLONE) 3 MG TABS Take 3 mg by mouth at bedtime. Take immediately before bedtime    . omeprazole (PRILOSEC) 40 MG capsule Take 40 mg by mouth daily.  5  . Vitamin D, Ergocalciferol, (DRISDOL) 50000 UNITS CAPS capsule Take 50,000 Units by mouth every 7 (seven) days.     No current facility-administered medications for this visit.    REVIEW OF SYSTEMS:   Constitutional: Denies fevers, chills or abnormal night sweats Eyes: Denies blurriness of vision, double vision or watery eyes Ears, nose, mouth, throat, and face: Denies mucositis or sore throat Respiratory: Denies cough, dyspnea  or wheezes Cardiovascular: Denies palpitation, chest discomfort or lower extremity swelling Gastrointestinal:  Denies nausea, heartburn or change in bowel habits Skin: Denies abnormal skin rashes Lymphatics: Denies new lymphadenopathy or easy bruising Neurological:Denies numbness, tingling or new weaknesses Behavioral/Psych: Mood is stable, no new changes  Breast:  Denies any palpable lumps or discharge All other systems were reviewed with the patient and are negative.  PHYSICAL EXAMINATION: ECOG PERFORMANCE STATUS: 0 - Asymptomatic  Filed Vitals:   12/05/14 1433  BP: 129/62  Pulse: 79  Temp: 97.7 F (36.5 C)  Resp: 18   Filed Weights   12/05/14 1433  Weight: 154 lb 8 oz (70.081 kg)    GENERAL:alert, no distress and comfortable SKIN: skin color, texture, turgor are normal, no rashes or significant lesions EYES: normal, conjunctiva are pink and non-injected, sclera clear OROPHARYNX:no exudate, no erythema and lips, buccal mucosa, and tongue  normal  NECK: supple, thyroid normal size, non-tender, without nodularity LYMPH:  no palpable lymphadenopathy in the cervical, axillary or inguinal LUNGS: clear to auscultation and percussion with normal breathing effort HEART: regular rate & rhythm and no murmurs and no lower extremity edema ABDOMEN:abdomen soft, non-tender and normal bowel sounds Musculoskeletal:no cyanosis of digits and no clubbing  PSYCH: alert & oriented x 3 with fluent speech NEURO: no focal motor/sensory deficits BREAST: No palpable nodules in the right breast or left chest wall. No palpable axillary or supraclavicular or infraclavicular lymphadenopathy (exam performed in the presence of a chaperone)   LABORATORY DATA:  I have reviewed the data as listed Lab Results  Component Value Date   WBC 13.4* 12/05/2014   HGB 13.4 12/05/2014   HCT 40.9 12/05/2014   MCV 92.5 12/05/2014   PLT 264 12/05/2014   Lab Results  Component Value Date   NA 144 12/05/2014   K  4.6 12/05/2014   CL 103 05/03/2014   CO2 26 12/05/2014    RADIOGRAPHIC STUDIES: I have personally reviewed the radiological reports and agreed with the findings in the report.  ASSESSMENT AND PLAN:  Breast cancer of upper-outer quadrant of left female breast Left breast invasive ductal carcinoma grade 1; 4.8 cm with low-grade DCIS; margins negative 1/4 lymph nodes positive, ER 100% PR 100% HER-2 negative ratio 1.18 Ki-67 26% and 19% T2 N1 stage IIB status post adjuvant chemotherapy with dose dense Adriamycin and Cytoxan 4 followed by Abraxane weekly 12 Completed Adjuvant radiation therapy 10/17/14  Treatment plan: Anti-estrogen therapy with anastrozole 1 mg daily started 10/27/2014. Tolerating it well without any toxicities 1. Hot flashes fairly mild Denies any myalgias  Patient is interested in participating in [facility] trial. Patient signed a consent form. She will be randomized to either Ibrance or anastrozole therapy.  Return to clinic based upon clinical trial requirement   All questions were answered. The patient knows to call the clinic with any problems, questions or concerns.    Rulon Eisenmenger, MD 3:20 PM

## 2014-12-06 ENCOUNTER — Other Ambulatory Visit: Payer: Self-pay | Admitting: Medical Oncology

## 2014-12-06 DIAGNOSIS — C50412 Malignant neoplasm of upper-outer quadrant of left female breast: Secondary | ICD-10-CM

## 2014-12-09 ENCOUNTER — Encounter: Payer: Self-pay | Admitting: Hematology and Oncology

## 2014-12-09 ENCOUNTER — Encounter: Payer: Self-pay | Admitting: Medical Oncology

## 2014-12-09 ENCOUNTER — Encounter: Payer: Self-pay | Admitting: Oncology

## 2014-12-09 ENCOUNTER — Other Ambulatory Visit: Payer: 59

## 2014-12-09 DIAGNOSIS — C50412 Malignant neoplasm of upper-outer quadrant of left female breast: Secondary | ICD-10-CM

## 2014-12-09 MED ORDER — INV-PALBOCICLIB 125 MG CAPS #23 ALLIANCE FOUNDATION AFT-05 (PALLAS): 125.0000 mg | ORAL_CAPSULE | Freq: Every day | ORAL | Status: DC

## 2014-12-09 NOTE — Progress Notes (Signed)
12/09/14 - [name] AFT-05 questionnaires - Patient into the cancer center this morning for the [place] study.  She completed her questionnaires for visit 1 and had her research labs drawn.  I checked the questionnaires for completeness.  The patient was thanked for her support in this clinical trial. Remer Macho 12/09/14 - 9:00 am

## 2014-12-09 NOTE — Progress Notes (Signed)
[name]: Cycle 1/Day 1 Patient with research required lab draw, PRO's and Palbociclib study drug dispense today. I met with patient today for Cycle 1 day 1 after patient had lab appointment for study required cycle 1 labs. PRO's completed by patient after her lab appointment in research audit room. I reviewed Palbociclib with patient and she knows to take with food at approximately the same time every day. I educated patient on drug diaries for both study drugs, Palbociclib and Antihormone therapy. Patient shown how to document the date, time, name and dosage of medication and number of pills taken and to document this each day. Patient educated on the event of missed or vomited medication and what to do should this happen. Patient educated on not to take any new prescription or OTC medications prior to discussing with Dr. Lindi Adie, as well as, not to eat or drink grapefruit/grapefruit juice during palbociclib treatment. Patient educated on how to store dispensed medication and to return bottle along with any unused pills, as well as, her documented study calendars at her follow up appointment in one months time. Patient gave verbal understanding, all patient's questions answered and patient knows to call Dr. Lindi Adie or myself for any further questions or concerns she may have. Informed patient that she will have a lab appointment scheduled for two weeks from now and an MD visit in one month. Patient knows to expect call from schedulers to schedule these appointments. I informed patient that I will follow up with her tomorrow with phone call to see how she is doing. I encouraged patient to call with any new symptoms she may experience, patient gave verbal understanding.  Palbociclib 125 mg study medication dispensed to patient, along with drug diaries for both Palbociclib and Antihormone therapy. Patient to start Palbociclib today in the evening, per patient.  POF sent to schedulers for upcoming appts. Adele Dan, RN, BSN Clinical Research 12/09/2014 9:48 AM

## 2014-12-10 ENCOUNTER — Telehealth: Payer: Self-pay | Admitting: Medical Oncology

## 2014-12-10 NOTE — Telephone Encounter (Signed)
[name]: LVMOM: Call to patient to inquire if she had any difficulty with taking her first day's (last night) study drug Palbociclib and to make she sure she wasn't having any unusual symptoms. Informed patient will call her again tomorrow and encouraged patient to call with any questions/concerns.

## 2014-12-11 ENCOUNTER — Telehealth: Payer: Self-pay | Admitting: Medical Oncology

## 2014-12-11 ENCOUNTER — Other Ambulatory Visit: Payer: Self-pay | Admitting: Medical Oncology

## 2014-12-11 DIAGNOSIS — C50412 Malignant neoplasm of upper-outer quadrant of left female breast: Secondary | ICD-10-CM

## 2014-12-11 NOTE — Telephone Encounter (Signed)
Theresa Huff with patient following up to see if patient had any questions/concerns regarding start of palbociclib on Monday and to confirm patient did start taking study medication as directed. I asked patient to return call.

## 2014-12-12 ENCOUNTER — Telehealth: Payer: Self-pay | Admitting: Medical Oncology

## 2014-12-12 NOTE — Telephone Encounter (Signed)
[name]: VMOM: Return call from patient, informing me that she is doing well and has not had any issues/concerns or side effects with study drug palbociclib since starting on 11/14. Patient did not express any questions on message.

## 2014-12-17 ENCOUNTER — Telehealth: Payer: Self-pay | Admitting: Hematology and Oncology

## 2014-12-17 NOTE — Telephone Encounter (Signed)
S/w pt, gave appts 11/28 + 12/12.

## 2014-12-23 ENCOUNTER — Encounter: Payer: Self-pay | Admitting: Medical Oncology

## 2014-12-23 ENCOUNTER — Telehealth: Payer: Self-pay | Admitting: Hematology and Oncology

## 2014-12-23 ENCOUNTER — Other Ambulatory Visit (HOSPITAL_BASED_OUTPATIENT_CLINIC_OR_DEPARTMENT_OTHER): Payer: 59

## 2014-12-23 DIAGNOSIS — C50412 Malignant neoplasm of upper-outer quadrant of left female breast: Secondary | ICD-10-CM

## 2014-12-23 DIAGNOSIS — Z006 Encounter for examination for normal comparison and control in clinical research program: Secondary | ICD-10-CM

## 2014-12-23 LAB — COMPREHENSIVE METABOLIC PANEL (CC13)
ALT: 10 U/L (ref 0–55)
AST: 12 U/L (ref 5–34)
Albumin: 3.6 g/dL (ref 3.5–5.0)
Alkaline Phosphatase: 66 U/L (ref 40–150)
Anion Gap: 9 mEq/L (ref 3–11)
BUN: 9.6 mg/dL (ref 7.0–26.0)
CALCIUM: 9.9 mg/dL (ref 8.4–10.4)
CHLORIDE: 106 meq/L (ref 98–109)
CO2: 26 meq/L (ref 22–29)
CREATININE: 0.9 mg/dL (ref 0.6–1.1)
EGFR: 67 mL/min/{1.73_m2} — ABNORMAL LOW (ref >90)
GLUCOSE: 65 mg/dL — AB (ref 70–140)
Potassium: 4.6 mEq/L (ref 3.5–5.1)
SODIUM: 142 meq/L (ref 136–145)
Total Bilirubin: 0.54 mg/dL (ref 0.20–1.20)
Total Protein: 6.7 g/dL (ref 6.4–8.3)

## 2014-12-23 LAB — CBC WITH DIFFERENTIAL/PLATELET
BASO%: 0.5 % (ref 0.0–2.0)
BASOS ABS: 0 10*3/uL (ref 0.0–0.1)
EOS ABS: 0 10*3/uL (ref 0.0–0.5)
EOS%: 2.1 % (ref 0.0–7.0)
HEMATOCRIT: 38 % (ref 34.8–46.6)
HEMOGLOBIN: 12.4 g/dL (ref 11.6–15.9)
LYMPH#: 1.1 10*3/uL (ref 0.9–3.3)
LYMPH%: 60.6 % — ABNORMAL HIGH (ref 14.0–49.7)
MCH: 30.5 pg (ref 25.1–34.0)
MCHC: 32.6 g/dL (ref 31.5–36.0)
MCV: 93.6 fL (ref 79.5–101.0)
MONO#: 0.1 10*3/uL (ref 0.1–0.9)
MONO%: 3.2 % (ref 0.0–14.0)
NEUT#: 0.6 10*3/uL — ABNORMAL LOW (ref 1.5–6.5)
NEUT%: 33.6 % — ABNORMAL LOW (ref 38.4–76.8)
Platelets: 148 10*3/uL (ref 145–400)
RBC: 4.06 10*6/uL (ref 3.70–5.45)
RDW: 13.8 % (ref 11.2–14.5)
WBC: 1.9 10*3/uL — ABNORMAL LOW (ref 3.9–10.3)

## 2014-12-23 NOTE — Progress Notes (Signed)
[name]: Cycle 1/Day 14 Patient here this morning for day 14 study lab check. I called patient to inform her of lab results, neutrophil count has dropped to 0.6 (Grade 3) as well as WBC to 1.9 (Grade 3) and that patient is neutropenic (patient aware of neutropenic precautions) as well as that I confirmed with Dr. Lindi Adie that she is to hold, not take, study drug Palbociclib for this week and to return next Monday (12/5) for labs and based on those results will proceed from there. Patient gave verbal understanding. Patient knows to continue with anastrazole as directed. I inquired with patient of any side effects or new symptoms she has been experiencing. Patient reports to have forgotten last nights Palbociclib dosage but otherwise no difficulty with administering pill to self. Patient confirms she is afebrile and denies any nausea, vomiting, pain or onset of new adverse events. Patient does report that she had started antibiotic amoxicillin for a tooth abscess that she wasn't aware of having and reported that she will be having tooth extracted and replaced. Patient states she has had some pain in that region of the mouth for approximately 2 months now and thought it was from possibly grinding her teeth during sleep, dentist confirmed abscess. Amoxicillin started last week Monday, last dosage to be taken today. I encouraged patient to call Dr. Lindi Adie or myself to report any adverse events, questions or concerns. Confirmed with patient that she will be here on the 5th of December for lab recheck. All patient's questions answered to her satisfaction, denies further questions at this time.  POF sent for lab appt on 12/5 Adele Dan, RN, BSN Clinical Research 12/23/2014 11:22 AM

## 2014-12-23 NOTE — Telephone Encounter (Signed)
Spoke with patient re appointment for lab 12/5 @ 9:30 am. Patient aware. Mira in research informed 8:15 am not available.

## 2014-12-25 ENCOUNTER — Other Ambulatory Visit: Payer: Self-pay | Admitting: Medical Oncology

## 2014-12-30 ENCOUNTER — Telehealth: Payer: Self-pay | Admitting: Medical Oncology

## 2014-12-30 ENCOUNTER — Other Ambulatory Visit (HOSPITAL_BASED_OUTPATIENT_CLINIC_OR_DEPARTMENT_OTHER): Payer: 59

## 2014-12-30 DIAGNOSIS — C50412 Malignant neoplasm of upper-outer quadrant of left female breast: Secondary | ICD-10-CM

## 2014-12-30 DIAGNOSIS — Z006 Encounter for examination for normal comparison and control in clinical research program: Secondary | ICD-10-CM

## 2014-12-30 LAB — COMPREHENSIVE METABOLIC PANEL
ALBUMIN: 3.8 g/dL (ref 3.5–5.0)
ALK PHOS: 69 U/L (ref 40–150)
ALT: <9 U/L (ref 0–55)
ANION GAP: 8 meq/L (ref 3–11)
AST: 11 U/L (ref 5–34)
BILIRUBIN TOTAL: 0.48 mg/dL (ref 0.20–1.20)
BUN: 15.5 mg/dL (ref 7.0–26.0)
CALCIUM: 9.5 mg/dL (ref 8.4–10.4)
CO2: 25 meq/L (ref 22–29)
Chloride: 106 mEq/L (ref 98–109)
Creatinine: 0.8 mg/dL (ref 0.6–1.1)
EGFR: 81 mL/min/{1.73_m2} — AB (ref >90)
Glucose: 73 mg/dl (ref 70–140)
Potassium: 4.4 mEq/L (ref 3.5–5.1)
Sodium: 139 mEq/L (ref 136–145)
TOTAL PROTEIN: 6.4 g/dL (ref 6.4–8.3)

## 2014-12-30 LAB — CBC WITH DIFFERENTIAL/PLATELET
BASO%: 0.8 % (ref 0.0–2.0)
Basophils Absolute: 0 10*3/uL (ref 0.0–0.1)
EOS ABS: 0 10*3/uL (ref 0.0–0.5)
EOS%: 0.5 % (ref 0.0–7.0)
HEMATOCRIT: 35.2 % (ref 34.8–46.6)
HGB: 11.7 g/dL (ref 11.6–15.9)
LYMPH%: 47.8 % (ref 14.0–49.7)
MCH: 30.7 pg (ref 25.1–34.0)
MCHC: 33.1 g/dL (ref 31.5–36.0)
MCV: 92.5 fL (ref 79.5–101.0)
MONO#: 0.4 10*3/uL (ref 0.1–0.9)
MONO%: 19.4 % — ABNORMAL HIGH (ref 0.0–14.0)
NEUT%: 31.5 % — ABNORMAL LOW (ref 38.4–76.8)
NEUTROS ABS: 0.6 10*3/uL — AB (ref 1.5–6.5)
PLATELETS: 117 10*3/uL — AB (ref 145–400)
RBC: 3.8 10*6/uL (ref 3.70–5.45)
RDW: 14 % (ref 11.2–14.5)
WBC: 2 10*3/uL — AB (ref 3.9–10.3)
lymph#: 1 10*3/uL (ref 0.9–3.3)

## 2014-12-30 NOTE — Telephone Encounter (Signed)
[name] Patient here today for repeat of CBC d/t decreased WBC and ANC on 11/28. I met with patient during her lab appointment to confirm that she has not taken any study drug this past week and to inquire as whether she has had any side effects since we last spoke. Patient confirmed she had not taken any of the study drug Palbociclib, since was told to hold it and confirms that she has not been experiencing any adverse events and denies any fever. Patient did say she had tooth extracted last week and per her dentist she still has an infection present in that tooth/absess and she is to continue with amoxicillin 4x daily for another week. Called patient to inform her of lab results, patient continues with decreased WBC and ANC, reminded patient of neutropenic precautions. Patient was asked to bring study drug bottle Palbociclib with remaining medication as well as calendars to her upcoming appointment. Informed patient with recheck labs again and Dr. Lindi Adie will assess patient and will proceed from there. Patient gave verbal understanding and knows not to take anymore study drug till after MD and lab assessment on 12/12, all her questions answered to her satisfaction. I thanked her for her time and encouraged her to call Dr. Lindi Adie or myself with any questions or concerns. MD informed of lab results.

## 2014-12-31 ENCOUNTER — Telehealth: Payer: Self-pay | Admitting: Medical Oncology

## 2014-12-31 NOTE — Telephone Encounter (Signed)
[name] LVMOM. Call to patient, per Dr. Lindi Adie to reinforce that patient knows to follow neutropenic precautions d/t ANC @ 0.6 and for patient to monitor temp. Informed patient should she develop temp of 100.5 or greater that she needs to go to ED for evaluation. Patient was encouraged to call clinic, Dr. Lindi Adie or myself with any questions or concerns.

## 2015-01-05 NOTE — Assessment & Plan Note (Signed)
Left breast invasive ductal carcinoma grade 1; 4.8 cm with low-grade DCIS; margins negative 1/4 lymph nodes positive, ER 100% PR 100% HER-2 negative ratio 1.18 Ki-67 26% and 19% T2 N1 stage IIB status post adjuvant chemotherapy with dose dense Adriamycin and Cytoxan 4 followed by Abraxane weekly 12 Completed Adjuvant radiation therapy 10/17/14  Current Treatment: [name] study: Randomized to Ibrance + Anastrozole Toxicities:  RTC in 2 weeks

## 2015-01-06 ENCOUNTER — Other Ambulatory Visit (HOSPITAL_BASED_OUTPATIENT_CLINIC_OR_DEPARTMENT_OTHER): Payer: 59

## 2015-01-06 ENCOUNTER — Encounter: Payer: Self-pay | Admitting: Medical Oncology

## 2015-01-06 ENCOUNTER — Ambulatory Visit (HOSPITAL_BASED_OUTPATIENT_CLINIC_OR_DEPARTMENT_OTHER): Payer: 59 | Admitting: Hematology and Oncology

## 2015-01-06 ENCOUNTER — Encounter: Payer: Self-pay | Admitting: Hematology and Oncology

## 2015-01-06 VITALS — BP 115/72 | HR 75 | Temp 97.8°F | Resp 17 | Ht 65.0 in | Wt 152.0 lb

## 2015-01-06 DIAGNOSIS — Z79811 Long term (current) use of aromatase inhibitors: Secondary | ICD-10-CM

## 2015-01-06 DIAGNOSIS — C50412 Malignant neoplasm of upper-outer quadrant of left female breast: Secondary | ICD-10-CM | POA: Diagnosis not present

## 2015-01-06 DIAGNOSIS — D701 Agranulocytosis secondary to cancer chemotherapy: Secondary | ICD-10-CM

## 2015-01-06 DIAGNOSIS — Z006 Encounter for examination for normal comparison and control in clinical research program: Secondary | ICD-10-CM

## 2015-01-06 DIAGNOSIS — Z170 Estrogen receptor positive status [ER+]: Secondary | ICD-10-CM

## 2015-01-06 LAB — COMPREHENSIVE METABOLIC PANEL
ALBUMIN: 3.8 g/dL (ref 3.5–5.0)
ALK PHOS: 75 U/L (ref 40–150)
ALT: 11 U/L (ref 0–55)
ANION GAP: 11 meq/L (ref 3–11)
AST: 16 U/L (ref 5–34)
BILIRUBIN TOTAL: 0.55 mg/dL (ref 0.20–1.20)
BUN: 13.2 mg/dL (ref 7.0–26.0)
CALCIUM: 9.5 mg/dL (ref 8.4–10.4)
CO2: 24 mEq/L (ref 22–29)
Chloride: 108 mEq/L (ref 98–109)
Creatinine: 0.8 mg/dL (ref 0.6–1.1)
EGFR: 78 mL/min/{1.73_m2} — AB (ref >90)
GLUCOSE: 70 mg/dL (ref 70–140)
Potassium: 4.4 mEq/L (ref 3.5–5.1)
SODIUM: 143 meq/L (ref 136–145)
TOTAL PROTEIN: 6.3 g/dL — AB (ref 6.4–8.3)

## 2015-01-06 LAB — CBC WITH DIFFERENTIAL/PLATELET
BASO%: 2 % (ref 0.0–2.0)
BASOS ABS: 0 10*3/uL (ref 0.0–0.1)
EOS ABS: 0 10*3/uL (ref 0.0–0.5)
EOS%: 1.7 % (ref 0.0–7.0)
HEMATOCRIT: 36.8 % (ref 34.8–46.6)
HGB: 12.1 g/dL (ref 11.6–15.9)
LYMPH%: 35.6 % (ref 14.0–49.7)
MCH: 30.3 pg (ref 25.1–34.0)
MCHC: 32.7 g/dL (ref 31.5–36.0)
MCV: 92.7 fL (ref 79.5–101.0)
MONO#: 0.4 10*3/uL (ref 0.1–0.9)
MONO%: 17.8 % — AB (ref 0.0–14.0)
NEUT#: 1 10*3/uL — ABNORMAL LOW (ref 1.5–6.5)
NEUT%: 42.9 % (ref 38.4–76.8)
Platelets: 317 10*3/uL (ref 145–400)
RBC: 3.97 10*6/uL (ref 3.70–5.45)
RDW: 15.1 % — ABNORMAL HIGH (ref 11.2–14.5)
WBC: 2.2 10*3/uL — ABNORMAL LOW (ref 3.9–10.3)
lymph#: 0.8 10*3/uL — ABNORMAL LOW (ref 0.9–3.3)

## 2015-01-06 MED ORDER — INV-PALBOCICLIB 125 MG CAPS #23 ALLIANCE FOUNDATION AFT-05 (PALLAS): 125.0000 mg | ORAL_CAPSULE | Freq: Every day | ORAL | Status: DC

## 2015-01-06 NOTE — Addendum Note (Signed)
Addended by: Prentiss Bells on: 01/06/2015 09:58 AM   Modules accepted: Medications

## 2015-01-06 NOTE — Progress Notes (Signed)
Patient Care Team: Antony Contras, MD as PCP - General (Family Medicine) Elsie Stain, MD (Pulmonary Disease) Rolm Bookbinder, MD as Consulting Physician (General Surgery) Nicholas Lose, MD as Consulting Physician (Hematology and Oncology) [name], MD as Consulting Physician (Radiation Oncology) Rockwell Germany, RN as Registered Nurse Mauro Kaufmann, RN as Registered Nurse Holley Bouche, NP as Nurse Practitioner (Nurse Practitioner)  DIAGNOSIS: Breast cancer of upper-outer quadrant of left female breast [facility])   Staging form: Breast, AJCC 7th Edition     Clinical stage from 02/13/2014: Stage IIB (T3, N0, M0) - Unsigned       Staging comments: Staged at breast conference on 1.20.16      Pathologic stage from 03/11/2014: Stage IIB (T2, N1a, cM0) - Signed by Seward Grater, MD on 03/19/2014       Staging comments: Staged on final mastectomy specimen by Dr. Avis Epley    SUMMARY OF ONCOLOGIC HISTORY:   Breast cancer of upper-outer quadrant of left female breast ([place])   02/04/2014 Initial Diagnosis left breast: Invasive ductal carcinoma with DCIS ER 100%, PR 100%, HER-2 negative, Ki-67 ranged from 19-26%; third biopsy showed invasive mammary cancer with lobular features and intraductal papilloma, ER/PR positive HER-2 negative   02/11/2014 Breast MRI left breast: 3 masses that are confluent measuring 5.6 x 2.7 x 3 cm extends to the left aspect of area along the penis to involve skin of the area no lymph nodes detected right breast negative   03/07/2014 Surgery Left mastectomy: invasive ductal carcinoma grade 1; 4.8 cm with low-grade DCIS; margins negative 1/4 lymph nodes positive, ER 100% PR 100% HER-2 negative ratio 1.18 Ki-67 26% and 19% T2 N1 stage IIB   04/03/2014 - 08/13/2014 Chemotherapy Adjuvant chemotherapy with dose dense Adriamycin and Cytoxan followed by weekly Abraxane 12   09/09/2014 - 10/17/2014 Radiation Therapy Left chest wall 50.4 Gy at 1.8 Gy per fraction x 28 fractions  Left supraclavicular fossa and axilla/ 45 Gy at 1.8 Gy per fraction x 25 fractions   10/27/2014 -  Anti-estrogen oral therapy Anastrozole 1 mg daily    CHIEF COMPLIANT: Holding Ibrance for neutropenia  INTERVAL HISTORY: Theresa Huff is a 64 year old with above-mentioned history of left breast cancer currently on clinical trial with Ibrance. She is tolerating the oral therapy extremely well. She has no side effects from the treatment other than blood counts. She was found to be neutropenic with an [place] of 600 after 2 weeks of therapy and treatment was held. Last week her [place] was still at 600. Today her [place] is 1000. She had a recent tooth infection that was treated with antibiotics. She does not have any symptoms side effects or concerns related to either Ibrance are anastrozole.  REVIEW OF SYSTEMS:   Constitutional: Denies fevers, chills or abnormal weight loss Eyes: Denies blurriness of vision Ears, nose, mouth, throat, and face: Denies mucositis or sore throat Respiratory: Denies cough, dyspnea or wheezes Cardiovascular: Denies palpitation, chest discomfort or lower extremity swelling Gastrointestinal:  Denies nausea, heartburn or change in bowel habits Skin: Denies abnormal skin rashes Lymphatics: Denies new lymphadenopathy or easy bruising Neurological:Denies numbness, tingling or new weaknesses Behavioral/Psych: Mood is stable, no new changes  Breast:  denies any pain or lumps or nodules in either breasts All other systems were reviewed with the patient and are negative.  I have reviewed the past medical history, past surgical history, social history and family history with the patient and they are unchanged from previous note.  ALLERGIES:  is allergic to hydrocodone; codeine; iodine; primidone; radiaplexrx; sulfa antibiotics; and tape.  MEDICATIONS:  Current Outpatient Prescriptions  Medication Sig Dispense Refill  . anastrozole (ARIMIDEX) 1 MG tablet Take 1 tablet (1 mg total) by  mouth daily. 90 tablet 3  . calcium carbonate (OS-CAL) 600 MG TABS tablet Take 600 mg by mouth daily.    . cetirizine (ZYRTEC) 10 MG tablet Take 10 mg by mouth daily.      . Cholecalciferol (VITAMIN D PO) Take 2,000 Units by mouth daily.    . DULoxetine (CYMBALTA) 30 MG capsule Take 30 mg by mouth daily.     . Eszopiclone (ESZOPICLONE) 3 MG TABS Take 3 mg by mouth at bedtime. Take immediately before bedtime    . Investigational palbociclib (IBRANCE) 125 MG capsule [employer] [facility] AFT-05 [name] Take 1 capsule (125 mg total) by mouth daily. Take with food. Swallow whole. Do not chew. Take on days 1-21. Repeat every 28 days. 23 capsule 0  . omeprazole (PRILOSEC) 40 MG capsule Take 40 mg by mouth daily.  5  . Vitamin D, Ergocalciferol, (DRISDOL) 50000 UNITS CAPS capsule Take 50,000 Units by mouth every 7 (seven) days.     No current facility-administered medications for this visit.    PHYSICAL EXAMINATION: ECOG PERFORMANCE STATUS: 0 - Asymptomatic  Filed Vitals:   01/06/15 0828  BP: 115/72  Pulse: 75  Temp: 97.8 F (36.6 C)  Resp: 17   Filed Weights   01/06/15 0828  Weight: 152 lb (68.947 kg)    GENERAL:alert, no distress and comfortable SKIN: skin color, texture, turgor are normal, no rashes or significant lesions EYES: normal, Conjunctiva are pink and non-injected, sclera clear OROPHARYNX:no exudate, no erythema and lips, buccal mucosa, and tongue normal  NECK: supple, thyroid normal size, non-tender, without nodularity LYMPH:  no palpable lymphadenopathy in the cervical, axillary or inguinal LUNGS: clear to auscultation and percussion with normal breathing effort HEART: regular rate & rhythm and no murmurs and no lower extremity edema ABDOMEN:abdomen soft, non-tender and normal bowel sounds Musculoskeletal:no cyanosis of digits and no clubbing  NEURO: alert & oriented x 3 with fluent speech, no focal motor/sensory deficits  LABORATORY DATA:  I have reviewed the data as  listed   Chemistry      Component Value Date/Time   NA 139 12/30/2014 0936   NA 139 05/03/2014 1946   K 4.4 12/30/2014 0936   K 4.1 05/03/2014 1946   CL 103 05/03/2014 1946   CO2 25 12/30/2014 0936   CO2 26 05/03/2014 1946   BUN 15.5 12/30/2014 0936   BUN 17 05/03/2014 1946   CREATININE 0.8 12/30/2014 0936   CREATININE 0.77 05/03/2014 1946      Component Value Date/Time   CALCIUM 9.5 12/30/2014 0936   CALCIUM 9.6 05/03/2014 1946   ALKPHOS 69 12/30/2014 0936   ALKPHOS 76 09/14/2010 1708   AST 11 12/30/2014 0936   AST 18 09/14/2010 1708   ALT <9 12/30/2014 0936   ALT 21 09/14/2010 1708   BILITOT 0.48 12/30/2014 0936   BILITOT 0.4 09/14/2010 1708       Lab Results  Component Value Date   WBC 2.2* 01/06/2015   HGB 12.1 01/06/2015   HCT 36.8 01/06/2015   MCV 92.7 01/06/2015   PLT 317 01/06/2015   NEUTROABS 1.0* 01/06/2015   ASSESSMENT & PLAN:  Breast cancer of upper-outer quadrant of left female breast Left breast invasive ductal carcinoma grade 1; 4.8 cm with low-grade DCIS; margins negative  1/4 lymph nodes positive, ER 100% PR 100% HER-2 negative ratio 1.18 Ki-67 26% and 19% T2 N1 stage IIB status post adjuvant chemotherapy with dose dense Adriamycin and Cytoxan 4 followed by Abraxane weekly 12 Completed Adjuvant radiation therapy 10/17/14  Current Treatment: [name] study: Randomized to Ibrance + Anastrozole Toxicities: 1. Neutropenia: Held treatment since 12/23/2014 Otherwise denies nausea vomiting diarrhea, constipation. Denies myalgias or arthralgias or stiffness  Plan:  resume Ibrance 125 mg daily based on clinical trial protocol.  Recent tooth infection: Treat with antibiotics for 2 weeks completed amoxicillin therapy.  RTC in 2 weeks for labs and 02/02/2014 for labs and follow-up.   Orders Placed This Encounter  Procedures  . CBC with Differential    Standing Status: Future     Number of Occurrences:      Standing Expiration Date: 01/06/2016  .  Comprehensive metabolic panel    Standing Status: Future     Number of Occurrences:      Standing Expiration Date: 01/06/2016   The patient has a good understanding of the overall plan. she agrees with it. she will call with any problems that may develop before the next visit here.   Rulon Eisenmenger, MD 01/06/2015

## 2015-01-06 NOTE — Progress Notes (Signed)
[name]: Cycle 2/Day 1, Arm A (Palbociclib + endocrine)  Patient returns to clinic today for start of Cycle 2 on study drug Palbociclib. I met with patient just before her lab appointment for patient to complete study required PRO's. Then, met with patient again in exam room for patient's office visit with Dr. Lindi Adie. Resting vitals obtained; B/P= 115/72, HR= 75, Resp= 17, Temp 97.8, O2 saturation @ 100%, patient maintaining weight @ 152 lb. Patient reports no symptoms or side effects from Palbociclib, denies nausea and vomiting, denies pain and does not report unusual aches. Patient also denies having any fever throughout Cycle 1. Patient was held on C1/D14 after her labs on 11/28 showed [place] @ 0.6 Patient was instructed to stop study drug and return to clinic for recheck of labs on the 12/5, in which [place] continued to be at 0.6. At this time patient was on 7-day break and was instructed to not take anymore study drug, but to continue endocrine therapy as instructed. Today's ANC with improvement @ 1.0, patient asymptomatic and per MD's review of labs, assessment of patient and per protocol, patient to start Cycle 2 of Palbociclib @ same dosage of 125 mg and to return to clinic in 2 weeks (12/27, d/t Christmas holiday and clinic closed on 12/26) for CBC, Cycle 2/D14. Patient reports to have missed one day of taking study drug during cycle 1 and had stopped taking study drug as instructed on 11/28 (last dose being 11/26). Patient also returned Cycle 1 Palbociclib dispensed bottle with 8 capsules remaining, given to PharmD, Raul Del who confirmed count. Patient returned Palbociclib medication diary, but forgot her endocrine med diary and will return in two weeks, at next lab appt.  Cycle 2 medication diaries, along with new study dispensed Palbociclib medication, given to patient today with instructions to call Dr. Lindi Adie or myself with any questions, concerns or A/E's she may have. Patient reports no new  medications and states final dose of amoxicillin, for tooth abscess, taken Saturday 12/10.  All patient's questions answered to her satisfaction and lab appointment confirmed for 12/27. Cycle 3 lab/MD appt schedule for 02/03/2015 Adele Dan, RN, BSN Clinical Research 01/06/2015 10:20 AM

## 2015-01-16 ENCOUNTER — Encounter: Payer: Self-pay | Admitting: Nurse Practitioner

## 2015-01-16 ENCOUNTER — Ambulatory Visit (HOSPITAL_BASED_OUTPATIENT_CLINIC_OR_DEPARTMENT_OTHER): Payer: 59 | Admitting: Nurse Practitioner

## 2015-01-16 VITALS — BP 122/69 | HR 91 | Temp 97.8°F | Resp 18 | Ht 65.0 in | Wt 154.9 lb

## 2015-01-16 DIAGNOSIS — C50412 Malignant neoplasm of upper-outer quadrant of left female breast: Secondary | ICD-10-CM | POA: Diagnosis not present

## 2015-01-16 DIAGNOSIS — Z170 Estrogen receptor positive status [ER+]: Secondary | ICD-10-CM | POA: Diagnosis not present

## 2015-01-16 NOTE — Progress Notes (Signed)
CLINIC:  Cancer Survivorship   REASON FOR VISIT:  Routine follow-up post-treatment for a recent history of breast cancer.  BRIEF ONCOLOGIC HISTORY:    Breast cancer of upper-outer quadrant of left female breast ([place])   01/30/2014 Mammogram Left breast: 2.4 cm irregular high density mass with indistinct margin, middle depth, 6.8 cm from nipple, with architectural distortion. There is also a 3.5 cm irregular high density asymmetry at 1:00, anterior depth, with arch distortion and thickening.   01/30/2014 Breast US Left breast: 3.7 cm irregular mass with indistinct margin at 2:00, posterior deth, 4 cm from nipple, hypoechoic. 10 x 1 cm irregular mass with lobulated and indistinct margin at 3:00, posterior depth. A third 0.9 cm lobulated mass at 1:00, posterior depth   02/04/2014 Initial Biopsy Left breast core needle bx x 3: Invasive ductal carcinoma with DCIS ER 100%, PR 100%, HER-2 negative, Ki-67 ranged from 19-26%; third biopsy showed invasive mammary cancer with lobular features and intraductal papilloma, ER/PR positive HER-2 negative   02/11/2014 Breast MRI Left breast: 3 masses that are confluent measuring 5.6 x 2.7 x 3 cm extending to the left aspect of the areola, appearing to involve the skin of theareola, deviating the nipple towards the left.  No axillary findings.  Neg rightt breast.   02/13/2014 Clinical Stage Stage IIB: T3 N0   03/07/2014 Definitive Surgery Left mastectomy/SLNB: invasive ductal carcinoma grade 1; 4.8 cm with low-grade DCIS; margins negative 1/4 lymph nodes positive, ER 100% PR 100% HER-2 negative ratio 1.18 Ki-67 26% and 19%    03/07/2014 Pathologic Stage Stage IIB (T2 N1)   04/03/2014 - 08/13/2014 Chemotherapy Adjuvant chemotherapy with dose dense adriamycin and cytoxan followed by weekly abraxane 12   09/09/2014 - 10/17/2014 Radiation Therapy Adjuvant RT [name]): Left chest wall 50.4 Gy over 28 fractions. Left supraclavicular fossa and axilla: 45 Gy over 25 fractions   10/27/2014 -  Anti-estrogen oral therapy Anastrozole 1 mg daily plus Ibrance ([name] Clinical Trial)    INTERVAL HISTORY:  Theresa Huff presents to the [facility] today for our initial meeting to review her survivorship care plan detailing her treatment course for breast cancer, as well as monitoring long-term side effects of that treatment, education regarding health maintenance, screening, and overall wellness and health promotion.     Overall, Theresa Huff reports feeling quite well since completing her radiation therapy approximately three months ago. She completed her chemotherapy in July 2016, as above, and has been on her anastrzole since October 2016.  She also has enrolled on the [name] study and is doing very well thus far. She denies any change along her mastectomy incision.  She will occasionally have a shooting pain along her left chest wall that is short lasting. She denies any mass or lesion in her right breast.  She denies headache, cough, shortness of breath or bone pain.  She has a good diminished appetite and denies any weight loss.  She has mild hot flashes, which are bearable.  She has been seen in the physical therapy department for lymphedema and describes some tightness in her left axilla without arm swelling.  REVIEW OF SYSTEMS:  General: Mild hot flashes which are manageable. Denies fever, chills, unintentional weight loss, or generalized fatigue.  HEENT: Wears glasses. Denies visual changes, hearing loss, mouth sores or difficulty swallowing. Cardiac: Denies palpitations, chest pain, and lower extremity edema.  Respiratory: Denies wheeze or dyspnea on exertion.  Breast: Lymphedema managed by lymphatic massage. Denies any new nodularity, masses, tenderness, nipple  changes, or nipple discharge in opposite breast.  GI: Denies abdominal pain, constipation, diarrhea, nausea, or vomiting.  GU: Denies dysuria, hematuria, vaginal bleeding, vaginal discharge, or vaginal dryness.    Musculoskeletal: Denies joint or bone pain.  Neuro: Mild peripheral neuropathy which is about the same. Denies recent fall. Skin: Denies rash, pruritis, or open wounds.  Psych: Denies depression, anxiety, insomnia, or memory loss.   A 14-point review of systems was completed and was negative, except as noted above.   ONCOLOGY TREATMENT TEAM:  1. Surgeon:  Dr. Donne Hazel at [facility]  2. Medical Oncologist: Dr. Lindi Adie 3. Radiation Oncologist: Dr. [name]    PAST MEDICAL/SURGICAL HISTORY:  Past Medical History  Diagnosis Date  . Cough   . Weight gain   . Hot flashes   . Insomnia, unspecified   . Depressive disorder, not elsewhere classified   . Allergic rhinitis due to pollen   . Plantar fasciitis   . Restless leg syndrome 09/09/2010  . Breast cancer ([place])   . Skin cancer   . GERD (gastroesophageal reflux disease)   . Wears glasses   . Anemia     with pregnancy  . Radiation 09/09/14-10/17/14    Left chestwall/supraclav.   Past Surgical History  Procedure Laterality Date  . Tonsillectomy    . Melanoma excision      right leg, left arm, right arm (several from each area)  . Colonoscopy    . Mastectomy w/ sentinel node biopsy Left 03/07/2014    Procedure: LEFT TOTAL MASTECTOMY WITH LEFT SENTINEL LYMPH NODE BIOPSY;  Surgeon: Rolm Bookbinder, MD;  Location: [facility];  Service: General;  Laterality: Left;  . Portacath placement Right 04/01/2014    Procedure: INSERTION PORT-A-CATH;  Surgeon: Rolm Bookbinder, MD;  Location: [place];  Service: General;  Laterality: Right;  . Port-a-cath removal N/A 12/04/2014    Procedure: REMOVAL PORT-A-CATH;  Surgeon: Rolm Bookbinder, MD;  Location: [place] [facility];  Service: General;  Laterality: N/A;     ALLERGIES:  Allergies  Allergen Reactions  . Hydrocodone Nausea And Vomiting  . Codeine Nausea And Vomiting  . Iodine Hives  . Primidone Itching and Nausea And Vomiting  . Radiaplexrx [Skin  Protectants, Misc.] Other (See Comments)    Contraindicated on pharmacy profile  . Sulfa Antibiotics Hives  . Tape Other (See Comments)    Redness, skin tear     CURRENT MEDICATIONS:  Current Outpatient Prescriptions on File Prior to Visit  Medication Sig Dispense Refill  . anastrozole (ARIMIDEX) 1 MG tablet Take 1 tablet (1 mg total) by mouth daily. 90 tablet 3  . calcium carbonate (OS-CAL) 600 MG TABS tablet Take 600 mg by mouth daily.    . cetirizine (ZYRTEC) 10 MG tablet Take 10 mg by mouth daily.      . Cholecalciferol (VITAMIN D PO) Take 2,000 Units by mouth daily.    . DULoxetine (CYMBALTA) 30 MG capsule Take 30 mg by mouth daily.     . Eszopiclone (ESZOPICLONE) 3 MG TABS Take 3 mg by mouth at bedtime. Take immediately before bedtime    . Investigational palbociclib (IBRANCE) 125 MG capsule [employer] [facility] AFT-05 [name] Take 1 capsule (125 mg total) by mouth daily. Take with food. Swallow whole. Do not chew. Take on days 1-21. Repeat every 28 days. 23 capsule 0  . omeprazole (PRILOSEC) 40 MG capsule Take 40 mg by mouth daily.  5  . Vitamin D, Ergocalciferol, (DRISDOL) 50000 UNITS CAPS capsule Take 50,000  Units by mouth every 7 (seven) days.     No current facility-administered medications on file prior to visit.     ONCOLOGIC FAMILY HISTORY:  Family History  Problem Relation Age of Onset  . Alcohol abuse Father   . Emphysema Mother   . Heart disease Mother   . Heart failure Mother   . Asthma Daughter   . Asthma Other     grandson  . Asthma Sister      GENETIC COUNSELING/TESTING: No    SOCIAL HISTORY:  JAMIELYNN WIGLEY is married and lives with her spouse in [place], [place].  She has 2 children and 4 grandchildren, whom she is looking forward to spending time with over the holidays. 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  She is a former smoker and denies any current or history of illicit drug use.   She uses alcohol socially, approximately one glass / week.   PHYSICAL EXAMINATION:  Vital Signs: Filed Vitals:   01/16/15 1535  BP: 122/69  Pulse: 91  Temp: 97.8 F (36.6 C)  Resp: 18   ECOG Performance Status: 0  General: Well-nourished, well-appearing female in no acute distress.  She is unaccompanied in clinic today.   HEENT: Head is atraumatic and normocephalic.  Pupils equal and reactive to light and accomodation. Conjunctivae clear without exudate.  Sclerae anicteric. Oral mucosa is pink, moist, and intact without lesions.  Oropharynx is pink without lesions or erythema.  Lymph: No cervical, supraclavicular, infraclavicular, or axillary lymphadenopathy noted on palpation.  Cardiovascular: Regular rate and rhythm without murmurs, rubs, or gallops. Respiratory: Clear to auscultation bilaterally. Chest expansion symmetric without accessory muscle use on inspiration or expiration.  GI: Abdomen soft and round. No tenderness to palpation. Bowel sounds normoactive in 4 quadrants. GU: Deferred.  Musculoskeletal: Muscle strength 5/5 in all extremities.   Neuro: No focal deficits. Steady gait.  Psych: Mood and affect normal and appropriate for situation.  Extremities: No edema, cyanosis, or clubbing.  Skin: Warm and dry. No open lesions noted.   LABORATORY DATA:  None for this visit.  DIAGNOSTIC IMAGING:  None for this visit.     ASSESSMENT AND PLAN:   1. Breast cancer: Stage IIB invasive ductal carcinoma of the left breast, grade 1, ER positive, PR positive, HER2/neu negative, S/P mastectomy followed by chemotherapy with dose dense doxorubicin and cyclophosphamide and weekly nab-paclitaxel, status post adjuvant radiation therapy to the left chest wall and supraclavicular area and axilla now on adjuvant endocrine therapy with anastrozole plus Ibrance on the [name] clinical trial  Theresa Huff is doing well without clinical symptoms worrisome for disease recurrence. She will follow-up  with her medical oncologist,  Dr. Lindi Adie, in January 2017 with history and physical examination per surveillance protocol.  She will continue her anti-estrogen therapy with anastrozole as prescribed by Dr. Lindi Adie at this time and continue to follow closely with the research nurses. She was instructed to make Dr. Lindi Adie or myself aware if she begins to experience any new or increased side effects of the medication and I could see her back in clinic to help manage those side effects, as needed. A comprehensive survivorship care plan and treatment summary was reviewed with the patient today detailing her breast cancer diagnosis, treatment course, potential late/long-term effects of treatment, appropriate follow-up care with recommendations for the future, and patient education resources.  A copy of this summary, along with a letter will be sent to the patient's  primary care provider via in basket message after today's visit.  Theresa Huff is welcome to return to the Survivorship Clinic in the future, as needed; no follow-up will be scheduled at this time.    2. Bone health:  Given Ms. Purewal's age/history of breast cancer and her current treatment regimen including endocrine therapy with anastrozole, she is at risk for bone demineralization.  Per our records, has not undergone recent DEXA scan but orders are in the computer.  We will continue to monitor this closely while she is on endocrine therapy.  In the meantime, she was encouraged to increase her consumption of foods rich in calcium and vitamin D as well as to increase her weight-bearing activities.  She was given education on specific activities to promote bone health.  3. Cancer screening:  Due to Theresa Huff's history and her age, she should receive screening for skin cancers, colon cancer, and gynecologic cancers.  The information and recommendations are listed on the patient's comprehensive care plan/treatment summary and were reviewed in detail with the  patient.    4. Health maintenance and wellness promotion: Theresa Huff was encouraged to consume 5-7 servings of fruits and vegetables per day. We reviewed the "Nutrition Rainbow" handout, as well as discussed recommendations to maximize nutrition and minimize recurrence, such as increased intake of fruits, vegetables, lean proteins, and minimizing the intake of red meats and processed foods.  She was also encouraged to engage in moderate to vigorous exercise for 30 minutes per day most days of the week. We discussed the [employer] fitness program, which is designed for cancer survivors to help them become more physically fit after cancer treatments.  She was instructed to limit her alcohol consumption and continue to abstain from tobacco use.  A copy of the "Take Control of Your Health" brochure was given to her reinforcing these recommendations.   5. Support services/counseling: It is not uncommon for this period of the patient's cancer care trajectory to be one of many emotions and stressors.  We discussed an opportunity for her to participate in the next session of [facility] ("Finding Your New Normal") support group series designed for patients after they have completed treatment.   Theresa Huff was encouraged to take advantage of our many other support services programs, support groups, and/or counseling in coping with her new life as a cancer survivor after completing anti-cancer treatment.  She was offered support today through active listening and expressive supportive counseling.  She was given information regarding our available services and encouraged to contact me with any questions or for help enrolling in any of our support group/programs.    A total of 45 minutes of face-to-face time was spent with this patient with greater than 50% of that time in counseling and care-coordination.   Sylvan Cheese, NP  Survivorship Program [facility] [phone]   Note: PRIMARY  CARE PROVIDER Gara Kroner, MD [phone]

## 2015-01-21 ENCOUNTER — Encounter: Payer: Self-pay | Admitting: Medical Oncology

## 2015-01-21 ENCOUNTER — Other Ambulatory Visit (HOSPITAL_BASED_OUTPATIENT_CLINIC_OR_DEPARTMENT_OTHER): Payer: 59

## 2015-01-21 ENCOUNTER — Other Ambulatory Visit: Payer: 59

## 2015-01-21 DIAGNOSIS — C50412 Malignant neoplasm of upper-outer quadrant of left female breast: Secondary | ICD-10-CM

## 2015-01-21 DIAGNOSIS — Z006 Encounter for examination for normal comparison and control in clinical research program: Secondary | ICD-10-CM

## 2015-01-21 LAB — COMPREHENSIVE METABOLIC PANEL
ALBUMIN: 3.9 g/dL (ref 3.5–5.0)
ALK PHOS: 69 U/L (ref 40–150)
ALT: 14 U/L (ref 0–55)
ANION GAP: 8 meq/L (ref 3–11)
AST: 13 U/L (ref 5–34)
BILIRUBIN TOTAL: 0.75 mg/dL (ref 0.20–1.20)
BUN: 13.4 mg/dL (ref 7.0–26.0)
CO2: 29 meq/L (ref 22–29)
CREATININE: 0.9 mg/dL (ref 0.6–1.1)
Calcium: 9.6 mg/dL (ref 8.4–10.4)
Chloride: 106 mEq/L (ref 98–109)
EGFR: 70 mL/min/{1.73_m2} — ABNORMAL LOW (ref >90)
Glucose: 91 mg/dl (ref 70–140)
Potassium: 4.9 mEq/L (ref 3.5–5.1)
Sodium: 143 mEq/L (ref 136–145)
TOTAL PROTEIN: 6.3 g/dL — AB (ref 6.4–8.3)

## 2015-01-21 LAB — CBC WITH DIFFERENTIAL/PLATELET
BASO%: 1.1 % (ref 0.0–2.0)
Basophils Absolute: 0 10*3/uL (ref 0.0–0.1)
EOS ABS: 0.1 10*3/uL (ref 0.0–0.5)
EOS%: 3.2 % (ref 0.0–7.0)
HEMATOCRIT: 36.5 % (ref 34.8–46.6)
HEMOGLOBIN: 12.1 g/dL (ref 11.6–15.9)
LYMPH#: 0.8 10*3/uL — AB (ref 0.9–3.3)
LYMPH%: 31.3 % (ref 14.0–49.7)
MCH: 31.9 pg (ref 25.1–34.0)
MCHC: 33.2 g/dL (ref 31.5–36.0)
MCV: 96 fL (ref 79.5–101.0)
MONO#: 0.3 10*3/uL (ref 0.1–0.9)
MONO%: 10.7 % (ref 0.0–14.0)
NEUT%: 53.7 % (ref 38.4–76.8)
NEUTROS ABS: 1.4 10*3/uL — AB (ref 1.5–6.5)
PLATELETS: 199 10*3/uL (ref 145–400)
RBC: 3.8 10*6/uL (ref 3.70–5.45)
RDW: 17.6 % — AB (ref 11.2–14.5)
WBC: 2.6 10*3/uL — AB (ref 3.9–10.3)

## 2015-01-21 NOTE — Progress Notes (Signed)
[name]: Cycle 2 Day 14 I saw patient this morning during her on study Day 14 lab draw. Patient reports no new concerns and no difficulty with medication. Patient denies any new A/E's.  Patient's CBC and CMET resulted and reviewed by Dr. Lindi Adie, patient's [place] at 1.4.  I called patient to inform her that per Dr. Lindi Adie and study protocol, patient to continue with final week of daily 125 mg capsule of palbociclib and then one week break, as well as to continue with endocrine therapy as instructed. Patient educated on neutropenic precautions since [place] lab resulted  just below normal range. Patient gave verbal understanding. All patient's questions answered to her satisfaction, denies questions at this time. Patient encouraged to contact Dr. Lindi Adie or myself with any questions or concerns. Adele Dan, RN, Clinical Research 01/21/2015 11:55 AM

## 2015-01-31 ENCOUNTER — Other Ambulatory Visit: Payer: Self-pay | Admitting: Medical Oncology

## 2015-01-31 DIAGNOSIS — C50412 Malignant neoplasm of upper-outer quadrant of left female breast: Secondary | ICD-10-CM

## 2015-02-03 ENCOUNTER — Ambulatory Visit (HOSPITAL_BASED_OUTPATIENT_CLINIC_OR_DEPARTMENT_OTHER): Payer: 59 | Admitting: Hematology and Oncology

## 2015-02-03 ENCOUNTER — Encounter: Payer: Self-pay | Admitting: Hematology and Oncology

## 2015-02-03 ENCOUNTER — Encounter: Payer: Self-pay | Admitting: Medical Oncology

## 2015-02-03 ENCOUNTER — Telehealth: Payer: Self-pay | Admitting: Hematology and Oncology

## 2015-02-03 ENCOUNTER — Other Ambulatory Visit: Payer: Self-pay | Admitting: Hematology and Oncology

## 2015-02-03 ENCOUNTER — Other Ambulatory Visit (HOSPITAL_BASED_OUTPATIENT_CLINIC_OR_DEPARTMENT_OTHER): Payer: 59

## 2015-02-03 VITALS — BP 126/73 | HR 84 | Temp 98.6°F | Resp 18 | Wt 153.4 lb

## 2015-02-03 DIAGNOSIS — C50412 Malignant neoplasm of upper-outer quadrant of left female breast: Secondary | ICD-10-CM

## 2015-02-03 DIAGNOSIS — Z9221 Personal history of antineoplastic chemotherapy: Secondary | ICD-10-CM | POA: Diagnosis not present

## 2015-02-03 DIAGNOSIS — Z170 Estrogen receptor positive status [ER+]: Secondary | ICD-10-CM | POA: Diagnosis not present

## 2015-02-03 DIAGNOSIS — Z923 Personal history of irradiation: Secondary | ICD-10-CM

## 2015-02-03 DIAGNOSIS — Z006 Encounter for examination for normal comparison and control in clinical research program: Secondary | ICD-10-CM

## 2015-02-03 DIAGNOSIS — Z79811 Long term (current) use of aromatase inhibitors: Secondary | ICD-10-CM

## 2015-02-03 LAB — COMPREHENSIVE METABOLIC PANEL
ALBUMIN: 3.9 g/dL (ref 3.5–5.0)
ALK PHOS: 70 U/L (ref 40–150)
ALT: 11 U/L (ref 0–55)
ANION GAP: 9 meq/L (ref 3–11)
AST: 13 U/L (ref 5–34)
BUN: 8.1 mg/dL (ref 7.0–26.0)
CALCIUM: 9.5 mg/dL (ref 8.4–10.4)
CHLORIDE: 107 meq/L (ref 98–109)
CO2: 25 mEq/L (ref 22–29)
CREATININE: 0.9 mg/dL (ref 0.6–1.1)
EGFR: 72 mL/min/{1.73_m2} — ABNORMAL LOW (ref >90)
Glucose: 83 mg/dl (ref 70–140)
POTASSIUM: 4.7 meq/L (ref 3.5–5.1)
Sodium: 141 mEq/L (ref 136–145)
Total Bilirubin: 0.53 mg/dL (ref 0.20–1.20)
Total Protein: 6.7 g/dL (ref 6.4–8.3)

## 2015-02-03 LAB — CBC WITH DIFFERENTIAL/PLATELET
BASO%: 1.5 % (ref 0.0–2.0)
BASOS ABS: 0.1 10*3/uL (ref 0.0–0.1)
EOS ABS: 0.1 10*3/uL (ref 0.0–0.5)
EOS%: 1.8 % (ref 0.0–7.0)
HEMATOCRIT: 36.2 % (ref 34.8–46.6)
HEMOGLOBIN: 11.9 g/dL (ref 11.6–15.9)
LYMPH#: 1 10*3/uL (ref 0.9–3.3)
LYMPH%: 26.2 % (ref 14.0–49.7)
MCH: 32.4 pg (ref 25.1–34.0)
MCHC: 32.9 g/dL (ref 31.5–36.0)
MCV: 98.6 fL (ref 79.5–101.0)
MONO#: 0.4 10*3/uL (ref 0.1–0.9)
MONO%: 10 % (ref 0.0–14.0)
NEUT#: 2.4 10*3/uL (ref 1.5–6.5)
NEUT%: 60.5 % (ref 38.4–76.8)
PLATELETS: 208 10*3/uL (ref 145–400)
RBC: 3.67 10*6/uL — ABNORMAL LOW (ref 3.70–5.45)
RDW: 17.6 % — ABNORMAL HIGH (ref 11.2–14.5)
WBC: 3.9 10*3/uL (ref 3.9–10.3)

## 2015-02-03 MED ORDER — INV-PALBOCICLIB 125 MG CAPS #23 ALLIANCE FOUNDATION AFT-05 (PALLAS): 125.0000 mg | ORAL_CAPSULE | Freq: Every day | ORAL | Status: DC

## 2015-02-03 NOTE — Telephone Encounter (Signed)
Called and left a message with her one month appointment

## 2015-02-03 NOTE — Progress Notes (Signed)
Patient Care Team: Antony Contras, MD as PCP - General (Family Medicine) Elsie Stain, MD (Pulmonary Disease) Rolm Bookbinder, MD as Consulting Physician (General Surgery) Nicholas Lose, MD as Consulting Physician (Hematology and Oncology) [name], MD as Consulting Physician (Radiation Oncology) Rockwell Germany, RN as Registered Nurse Mauro Kaufmann, RN as Registered Nurse Holley Bouche, NP as Nurse Practitioner (Nurse Practitioner) Sylvan Cheese, NP as Nurse Practitioner (Hematology and Oncology)  DIAGNOSIS: Breast cancer of upper-outer quadrant of left female breast [facility])   Staging form: Breast, AJCC 7th Edition     Clinical stage from 02/13/2014: Stage IIB (T3, N0, M0) - Unsigned       Staging comments: Staged at breast conference on 1.20.16      Pathologic stage from 03/11/2014: Stage IIB (T2, N1a, cM0) - Signed by Seward Grater, MD on 03/19/2014       Staging comments: Staged on final mastectomy specimen by Dr. Avis Epley    SUMMARY OF ONCOLOGIC HISTORY:   Breast cancer of upper-outer quadrant of left female breast ([place])   01/30/2014 Mammogram Left breast: 2.4 cm irregular high density mass with indistinct margin, middle depth, 6.8 cm from nipple, with architectural distortion. There is also a 3.5 cm irregular high density asymmetry at 1:00, anterior depth, with arch distortion and thickening.   01/30/2014 Breast US Left breast: 3.7 cm irregular mass with indistinct margin at 2:00, posterior deth, 4 cm from nipple, hypoechoic. 10 x 1 cm irregular mass with lobulated and indistinct margin at 3:00, posterior depth. A third 0.9 cm lobulated mass at 1:00, posterior depth   02/04/2014 Initial Biopsy Left breast core needle bx x 3: Invasive ductal carcinoma with DCIS ER 100%, PR 100%, HER-2 negative, Ki-67 ranged from 19-26%; third biopsy showed invasive mammary cancer with lobular features and intraductal papilloma, ER/PR positive HER-2 negative   02/11/2014 Breast MRI  Left breast: 3 masses that are confluent measuring 5.6 x 2.7 x 3 cm extending to the left aspect of the areola, appearing to involve the skin of theareola, deviating the nipple towards the left.  No axillary findings.  Neg rightt breast.   02/13/2014 Clinical Stage Stage IIB: T3 N0   03/07/2014 Definitive Surgery Left mastectomy/SLNB: invasive ductal carcinoma grade 1; 4.8 cm with low-grade DCIS; margins negative 1/4 lymph nodes positive, ER 100% PR 100% HER-2 negative ratio 1.18 Ki-67 26% and 19%    03/07/2014 Pathologic Stage Stage IIB (T2 N1)   04/03/2014 - 08/13/2014 Chemotherapy Adjuvant chemotherapy with dose dense adriamycin and cytoxan followed by weekly abraxane 12   09/09/2014 - 10/17/2014 Radiation Therapy Adjuvant RT [name]): Left chest wall 50.4 Gy over 28 fractions. Left supraclavicular fossa and axilla: 45 Gy over 25 fractions   10/27/2014 -  Anti-estrogen oral therapy Anastrozole 1 mg daily plus Ibrance ([name] Clinical Trial)   01/16/2015 Survivorship Survivorship visit completed and copy of care plan provided to patient.    CHIEF COMPLIANT: cycle 3 Ibrance with letrozole on clinical trial  INTERVAL HISTORY: Theresa Huff is a 65 year old with above-mentioned history of left breast cancer currently on adjuvant antiestrogen therapy. She is participating in a clinical trial and is randomized to [place] with letrozole. She is tolerating Ibrance extremely well. She denies any nausea or vomiting. Denies any fatigue. Has occasional hot flashes related to anastrozole.  REVIEW OF SYSTEMS:   Constitutional: Denies fevers, chills or abnormal weight loss Eyes: Denies blurriness of vision Ears, nose, mouth, throat, and face: Denies mucositis or sore throat  Respiratory: Denies cough, dyspnea or wheezes Cardiovascular: Denies palpitation, chest discomfort Gastrointestinal:  Denies nausea, heartburn or change in bowel habits Skin: Denies abnormal skin rashes Lymphatics: Denies new  lymphadenopathy or easy bruising Neurological:Denies numbness, tingling or new weaknesses Behavioral/Psych: Mood is stable, no new changes  Extremities: No lower extremity edema All other systems were reviewed with the patient and are negative.  I have reviewed the past medical history, past surgical history, social history and family history with the patient and they are unchanged from previous note.  ALLERGIES:  is allergic to hydrocodone; codeine; iodine; primidone; radiaplexrx; sulfa antibiotics; and tape.  MEDICATIONS:  Current Outpatient Prescriptions  Medication Sig Dispense Refill  . anastrozole (ARIMIDEX) 1 MG tablet Take 1 tablet (1 mg total) by mouth daily. 90 tablet 3  . calcium carbonate (OS-CAL) 600 MG TABS tablet Take 600 mg by mouth daily.    . cetirizine (ZYRTEC) 10 MG tablet Take 10 mg by mouth daily.      . Cholecalciferol (VITAMIN D PO) Take 2,000 Units by mouth daily.    . DULoxetine (CYMBALTA) 30 MG capsule Take 30 mg by mouth daily.     . Eszopiclone (ESZOPICLONE) 3 MG TABS Take 3 mg by mouth at bedtime. Take immediately before bedtime    . Investigational palbociclib (IBRANCE) 125 MG capsule [facility] AFT-05 [name] Take 1 capsule (125 mg total) by mouth daily. Take with food. Swallow whole. Do not chew. Take on days 1-21. Repeat every 28 days. 23 capsule 0  . omeprazole (PRILOSEC) 40 MG capsule Take 40 mg by mouth daily.  5  . Vitamin D, Ergocalciferol, (DRISDOL) 50000 UNITS CAPS capsule Take 50,000 Units by mouth every 7 (seven) days.     No current facility-administered medications for this visit.    PHYSICAL EXAMINATION: ECOG PERFORMANCE STATUS: 0  Filed Vitals:   02/03/15 0857  BP: 126/73  Pulse: 84  Temp: 98.6 F (37 C)  Resp: 18   Filed Weights   02/03/15 0857  Weight: 153 lb 6.4 oz (69.582 kg)    GENERAL:alert, no distress and comfortable SKIN: skin color, texture, turgor are normal, no rashes or significant lesions EYES: normal,  Conjunctiva are pink and non-injected, sclera clear OROPHARYNX:no exudate, no erythema and lips, buccal mucosa, and tongue normal  NECK: supple, thyroid normal size, non-tender, without nodularity LYMPH:  no palpable lymphadenopathy in the cervical, axillary or inguinal LUNGS: clear to auscultation and percussion with normal breathing effort HEART: regular rate & rhythm and no murmurs and no lower extremity edema ABDOMEN:abdomen soft, non-tender and normal bowel sounds MUSCULOSKELETAL:no cyanosis of digits and no clubbing  NEURO: alert & oriented x 3 with fluent speech, no focal motor/sensory deficits EXTREMITIES: No lower extremity edema  LABORATORY DATA:  I have reviewed the data as listed   Chemistry      Component Value Date/Time   NA 141 02/03/2015 0847   NA 139 05/03/2014 1946   K 4.7 02/03/2015 0847   K 4.1 05/03/2014 1946   CL 103 05/03/2014 1946   CO2 25 02/03/2015 0847   CO2 26 05/03/2014 1946   BUN 8.1 02/03/2015 0847   BUN 17 05/03/2014 1946   CREATININE 0.9 02/03/2015 0847   CREATININE 0.77 05/03/2014 1946      Component Value Date/Time   CALCIUM 9.5 02/03/2015 0847   CALCIUM 9.6 05/03/2014 1946   ALKPHOS 70 02/03/2015 0847   ALKPHOS 76 09/14/2010 1708   AST 13 02/03/2015 0847   AST 18 09/14/2010 1708  ALT 11 02/03/2015 0847   ALT 21 09/14/2010 1708   BILITOT 0.53 02/03/2015 0847   BILITOT 0.4 09/14/2010 1708       Lab Results  Component Value Date   WBC 3.9 02/03/2015   HGB 11.9 02/03/2015   HCT 36.2 02/03/2015   MCV 98.6 02/03/2015   PLT 208 02/03/2015   NEUTROABS 2.4 02/03/2015     ASSESSMENT & PLAN:  Breast cancer of upper-outer quadrant of left female breast Left breast invasive ductal carcinoma grade 1; 4.8 cm with low-grade DCIS; margins negative 1/4 lymph nodes positive, ER 100% PR 100% HER-2 negative ratio 1.18 Ki-67 26% and 19% T2 N1 stage IIB status post adjuvant chemotherapy with dose dense Adriamycin and Cytoxan 4 followed by  Abraxane weekly 12 Completed Adjuvant radiation therapy 10/17/14  Current Treatment: [name] study: Randomized to Ibrance + Anastrozole  Started 10/27/2014 Toxicities: 1. Neutropenia: Held treatment since 12/23/2014 and subsequently resumed treatment with recovery of blood counts. She completed 2 cycles of Ibrance and is here for cycle 3  Denies nausea vomiting diarrhea, constipation. Denies myalgias or arthralgias or stiffness Patient gets itching prior to getting a hot flash which is directly related to anastrozole. It is not related to study drug.  Plan: Cycle 3 Ibrance 125 mg daily based on clinical trial protocol  RTC in 4 weeks for labs and follow-up.    Orders Placed This Encounter  Procedures  . CBC with Differential    Standing Status: Future     Number of Occurrences:      Standing Expiration Date: 02/03/2016   The patient has a good understanding of the overall plan. she agrees with it. she will call with any problems that may develop before the next visit here.   Rulon Eisenmenger, MD 02/03/2015

## 2015-02-03 NOTE — Assessment & Plan Note (Signed)
Left breast invasive ductal carcinoma grade 1; 4.8 cm with low-grade DCIS; margins negative 1/4 lymph nodes positive, ER 100% PR 100% HER-2 negative ratio 1.18 Ki-67 26% and 19% T2 N1 stage IIB status post adjuvant chemotherapy with dose dense Adriamycin and Cytoxan 4 followed by Abraxane weekly 12 Completed Adjuvant radiation therapy 10/17/14  Current Treatment: [name] study: Randomized to Ibrance + Anastrozole  Started 10/27/2014 Toxicities: 1. Neutropenia: Held treatment since 12/23/2014 Otherwise denies nausea vomiting diarrhea, constipation. Denies myalgias or arthralgias or stiffness  Plan: cycle 3 Ibrance 125 mg daily based on clinical trial protocol  RTC in 2 weeks for labs and follow-up.

## 2015-02-03 NOTE — Progress Notes (Signed)
[name]: Cycle 3 (4 & 5), Treatment Arm A (Ibrance + anti hormone therapy) Patient here today for scheduled Cycle 3 appointment. I met with patient prior to her scheduled lab appointment and provided patient with study questionnaires to complete. I met patient again in exam room after completion of labs and questionnaires. Resting vital signs completed. Completed questionnaires were collected as well as return of Cycle 2 study bottle with one study pill remaining and Cycle 2 and Cycle 1 (Anastrozole diary part) medication diaries. Patient reports no difficulty with taking medications or documentation, does report to have missed one anti hormone pill and she documented missed dose in cycle 2 anti hormone medication diary. Patient denies having any nausea or vomiting, denies pain, denies any bowel or bladder concerns, denies any muscle aches or fatigue and reports no concerns. Patient does report that she has noticed when she does have the occasional hot flash, she gets itchy just prior and once hot flash is over itchiness goes away. Patient reports to having hot flashes "a few times a week." Per MD, itchiness is directly related to the anastrozole. Per Dr. Geralyn Flash assessment of patient and review of patient's labs and NCS labs, patient cleared to proceed with start of next cycle (Cycle 3, 4 ,& 5 per protocol). MD would like for patient to return in one month for lab check. Patient was administered Cycle 3, Cycle 4 and Cycle 5 study provided Ibrance medication bottles, administered by PharmD Kennith Center and given to patient by this nurse, along with study provided medication diaries for each cycle with stop and start dates. Patient was educated again on self administration of medication as well as to call Dr. Lindi Adie or myself with any concerns or questions she may have. All patient's questions answered to her satisfaction, patient denies further questions at this time and knows to return to clinic in one month for  lab appointment. I thanked patient for her time and continued support of study. Adele Dan, RN, BSN Clinical Research 02/03/2015 11:17 AM

## 2015-02-03 NOTE — Addendum Note (Signed)
Addended by: Prentiss Bells on: 02/03/2015 10:35 AM   Modules accepted: Medications

## 2015-02-10 ENCOUNTER — Other Ambulatory Visit: Payer: Self-pay | Admitting: *Deleted

## 2015-02-10 ENCOUNTER — Encounter: Payer: Self-pay | Admitting: Hematology and Oncology

## 2015-02-10 DIAGNOSIS — C50412 Malignant neoplasm of upper-outer quadrant of left female breast: Secondary | ICD-10-CM

## 2015-02-10 MED ORDER — AMOXICILLIN 500 MG PO TABS: 500.0000 mg | ORAL_TABLET | Freq: Two times a day (BID) | ORAL | Status: DC

## 2015-03-03 ENCOUNTER — Telehealth: Payer: Self-pay | Admitting: Hematology and Oncology

## 2015-03-03 ENCOUNTER — Ambulatory Visit (HOSPITAL_BASED_OUTPATIENT_CLINIC_OR_DEPARTMENT_OTHER): Payer: 59 | Admitting: Hematology and Oncology

## 2015-03-03 ENCOUNTER — Encounter: Payer: Self-pay | Admitting: Hematology and Oncology

## 2015-03-03 ENCOUNTER — Encounter: Payer: Self-pay | Admitting: Medical Oncology

## 2015-03-03 ENCOUNTER — Other Ambulatory Visit (HOSPITAL_BASED_OUTPATIENT_CLINIC_OR_DEPARTMENT_OTHER): Payer: 59

## 2015-03-03 ENCOUNTER — Encounter: Payer: Self-pay | Admitting: *Deleted

## 2015-03-03 VITALS — BP 117/65 | HR 80 | Temp 98.0°F | Resp 18 | Ht 65.0 in | Wt 156.8 lb

## 2015-03-03 DIAGNOSIS — C773 Secondary and unspecified malignant neoplasm of axilla and upper limb lymph nodes: Secondary | ICD-10-CM | POA: Diagnosis not present

## 2015-03-03 DIAGNOSIS — C50412 Malignant neoplasm of upper-outer quadrant of left female breast: Secondary | ICD-10-CM

## 2015-03-03 DIAGNOSIS — D702 Other drug-induced agranulocytosis: Secondary | ICD-10-CM | POA: Diagnosis not present

## 2015-03-03 LAB — COMPREHENSIVE METABOLIC PANEL
ALT: 12 U/L (ref 0–55)
AST: 11 U/L (ref 5–34)
Albumin: 3.7 g/dL (ref 3.5–5.0)
Alkaline Phosphatase: 64 U/L (ref 40–150)
Anion Gap: 10 mEq/L (ref 3–11)
BUN: 11.1 mg/dL (ref 7.0–26.0)
CALCIUM: 9.3 mg/dL (ref 8.4–10.4)
CHLORIDE: 108 meq/L (ref 98–109)
CO2: 25 meq/L (ref 22–29)
CREATININE: 0.8 mg/dL (ref 0.6–1.1)
EGFR: 79 mL/min/{1.73_m2} — ABNORMAL LOW (ref >90)
Glucose: 91 mg/dl (ref 70–140)
Potassium: 4.8 mEq/L (ref 3.5–5.1)
Sodium: 143 mEq/L (ref 136–145)
Total Bilirubin: 0.41 mg/dL (ref 0.20–1.20)
Total Protein: 6.4 g/dL (ref 6.4–8.3)

## 2015-03-03 LAB — CBC WITH DIFFERENTIAL/PLATELET
BASO%: 1.7 % (ref 0.0–2.0)
BASOS ABS: 0 10*3/uL (ref 0.0–0.1)
EOS ABS: 0 10*3/uL (ref 0.0–0.5)
EOS%: 2.2 % (ref 0.0–7.0)
HCT: 33.4 % — ABNORMAL LOW (ref 34.8–46.6)
HGB: 11 g/dL — ABNORMAL LOW (ref 11.6–15.9)
LYMPH%: 50 % — AB (ref 14.0–49.7)
MCH: 33.4 pg (ref 25.1–34.0)
MCHC: 32.9 g/dL (ref 31.5–36.0)
MCV: 101.5 fL — AB (ref 79.5–101.0)
MONO#: 0.3 10*3/uL (ref 0.1–0.9)
MONO%: 19.1 % — AB (ref 0.0–14.0)
NEUT#: 0.5 10*3/uL — CL (ref 1.5–6.5)
NEUT%: 27 % — ABNORMAL LOW (ref 38.4–76.8)
Platelets: 136 10*3/uL — ABNORMAL LOW (ref 145–400)
RBC: 3.29 10*6/uL — AB (ref 3.70–5.45)
RDW: 18 % — ABNORMAL HIGH (ref 11.2–14.5)
WBC: 1.8 10*3/uL — AB (ref 3.9–10.3)
lymph#: 0.9 10*3/uL (ref 0.9–3.3)

## 2015-03-03 NOTE — Assessment & Plan Note (Signed)
Left breast invasive ductal carcinoma grade 1; 4.8 cm with low-grade DCIS; margins negative 1/4 lymph nodes positive, ER 100% PR 100% HER-2 negative ratio 1.18 Ki-67 26% and 19% T2 N1 stage IIB status post adjuvant chemotherapy with dose dense Adriamycin and Cytoxan 4 followed by Abraxane weekly 12 Completed Adjuvant radiation therapy 10/17/14  Current Treatment: [name] study: Randomized to Ibrance + Anastrozole Started 10/27/2014 Toxicities: 1. Neutropenia: Held treatment since 12/23/2014 and subsequently resumed treatment with recovery of blood counts. She completed 3 cycles of Ibrance and is here for cycle 4.  Denies nausea vomiting diarrhea, constipation. Denies myalgias or arthralgias or stiffness Patient gets itching prior to getting a hot flash which is directly related to anastrozole. It is not related to study drug.  Plan: Cycle 4 Ibrance 125 mg daily based on clinical trial protocol  RTC in 4 weeks for labs and follow-up.

## 2015-03-03 NOTE — Progress Notes (Signed)
[name]: Cycle 4 Patient here today for labs and MD visit. MD wanted to follow up with patient in a months time, after completion of Cycle 3 to assess and check patient's labs prior to patient starting Cycle 4. Today's labs resulted with decreased ANC and WBC to grade 3. Per study protocol, patient to hold Palbociclib, patient gave verbal understanding to not take anymore study medication till further notice. Patient to return in one weeks time for lab recheck. Patient was informed next cycle, cycle 4, would be dose reduced as required by study protocol guidelines, due to second occurrence of grade 3 ANC. Patient denies fevers, nausea or vomiting and denies any new or unusual symptoms. Patient returned study dispensed medication bottle with no remaining medication, as well as cycle 3 medication diaries today. Empty study medication bottle given to Dene Gentry, pharmacist.  In one weeks time, patient to return to clinic for lab recheck and MD appt., and if counts recover, for dispense of Palbociclib 100 mg. Patient knows to return medication diaries and already dispensed Cycle 4 and Cycle 5 study medication, Palbociclib, on 03/09/2012, new medication diaries and Palbociclib 100 mg to be dispensed at that time, pending recovery of lab counts. All patient's questions answered to her satisfaction and she was advised to follow neutropenic precautions, patient encouraged to contact Dr. Lindi Adie or myself with any questions or concerns. Adele Dan, RN, BSN Clinical Research 03/03/2015 10:23 AM

## 2015-03-03 NOTE — Telephone Encounter (Signed)
Appointments made per 2/6 pof and avs printed

## 2015-03-03 NOTE — Progress Notes (Signed)
Patient Care Team: Antony Contras, MD as PCP - General (Family Medicine) Elsie Stain, MD (Pulmonary Disease) Rolm Bookbinder, MD as Consulting Physician (General Surgery) Nicholas Lose, MD as Consulting Physician (Hematology and Oncology) [name], MD as Consulting Physician (Radiation Oncology) Rockwell Germany, RN as Registered Nurse Mauro Kaufmann, RN as Registered Nurse Holley Bouche, NP as Nurse Practitioner (Nurse Practitioner) Sylvan Cheese, NP as Nurse Practitioner (Hematology and Oncology)  DIAGNOSIS: Breast cancer of upper-outer quadrant of left female breast [facility])   Staging form: Breast, AJCC 7th Edition     Clinical stage from 02/13/2014: Stage IIB (T3, N0, M0) - Unsigned       Staging comments: Staged at breast conference on 1.20.16      Pathologic stage from 03/11/2014: Stage IIB (T2, N1a, cM0) - Signed by Seward Grater, MD on 03/19/2014       Staging comments: Staged on final mastectomy specimen by Dr. Avis Epley  SUMMARY OF ONCOLOGIC HISTORY:   Breast cancer of upper-outer quadrant of left female breast ([place])   01/30/2014 Mammogram Left breast: 2.4 cm irregular high density mass with indistinct margin, middle depth, 6.8 cm from nipple, with architectural distortion. There is also a 3.5 cm irregular high density asymmetry at 1:00, anterior depth, with arch distortion and thickening.   01/30/2014 Breast US Left breast: 3.7 cm irregular mass with indistinct margin at 2:00, posterior deth, 4 cm from nipple, hypoechoic. 10 x 1 cm irregular mass with lobulated and indistinct margin at 3:00, posterior depth. A third 0.9 cm lobulated mass at 1:00, posterior depth   02/04/2014 Initial Biopsy Left breast core needle bx x 3: Invasive ductal carcinoma with DCIS ER 100%, PR 100%, HER-2 negative, Ki-67 ranged from 19-26%; third biopsy showed invasive mammary cancer with lobular features and intraductal papilloma, ER/PR positive HER-2 negative   02/11/2014 Breast MRI Left  breast: 3 masses that are confluent measuring 5.6 x 2.7 x 3 cm extending to the left aspect of the areola, appearing to involve the skin of theareola, deviating the nipple towards the left.  No axillary findings.  Neg rightt breast.   02/13/2014 Clinical Stage Stage IIB: T3 N0   03/07/2014 Definitive Surgery Left mastectomy/SLNB: invasive ductal carcinoma grade 1; 4.8 cm with low-grade DCIS; margins negative 1/4 lymph nodes positive, ER 100% PR 100% HER-2 negative ratio 1.18 Ki-67 26% and 19%    03/07/2014 Pathologic Stage Stage IIB (T2 N1)   04/03/2014 - 08/13/2014 Chemotherapy Adjuvant chemotherapy with dose dense adriamycin and cytoxan followed by weekly abraxane 12   09/09/2014 - 10/17/2014 Radiation Therapy Adjuvant RT [name]): Left chest wall 50.4 Gy over 28 fractions. Left supraclavicular fossa and axilla: 45 Gy over 25 fractions   10/27/2014 -  Anti-estrogen oral therapy Anastrozole 1 mg daily plus Ibrance ([name] Clinical Trial)   01/16/2015 Survivorship Survivorship visit completed and copy of care plan provided to patient.    CHIEF COMPLIANT: follow-up [name] clinical trial  INTERVAL HISTORY: Theresa Huff is a 65 year old with above-mentioned history of left breast cancer currently on adjuvant therapy with anastrozole and Ibrance. She is tolerating Ibrance clinically fairly well. However her neutrophil count appears to come down due to [place] therapy. Today her [place] is only 500. She denies any fevers or chills. Denies fatigue or weakness. She is still working full-time for [employer]. Enhancing nausea vomiting or diarrhea constipation. Denies any fatigue.  REVIEW OF SYSTEMS:   Constitutional: Denies fevers, chills or abnormal weight loss Eyes: Denies blurriness of  vision Ears, nose, mouth, throat, and face: Denies mucositis or sore throat Respiratory: Denies cough, dyspnea or wheezes Cardiovascular: Denies palpitation, chest discomfort Gastrointestinal:  Denies nausea, heartburn or change  in bowel habits Skin: Denies abnormal skin rashes Lymphatics: Denies new lymphadenopathy or easy bruising Neurological:Denies numbness, tingling or new weaknesses Behavioral/Psych: Mood is stable, no new changes  Extremities: No lower extremity edema Breast:  denies any pain or lumps or nodules in either breasts All other systems were reviewed with the patient and are negative.  I have reviewed the past medical history, past surgical history, social history and family history with the patient and they are unchanged from previous note.  ALLERGIES:  is allergic to hydrocodone; codeine; iodine; primidone; radiaplexrx; sulfa antibiotics; and tape.  MEDICATIONS:  Current Outpatient Prescriptions  Medication Sig Dispense Refill  . anastrozole (ARIMIDEX) 1 MG tablet Take 1 tablet (1 mg total) by mouth daily. 90 tablet 3  . calcium carbonate (OS-CAL) 600 MG TABS tablet Take 600 mg by mouth daily.    . cetirizine (ZYRTEC) 10 MG tablet Take 10 mg by mouth daily.      . Cholecalciferol (VITAMIN D PO) Take 2,000 Units by mouth daily.    . DULoxetine (CYMBALTA) 30 MG capsule Take 30 mg by mouth daily.     . Eszopiclone (ESZOPICLONE) 3 MG TABS Take 3 mg by mouth at bedtime. Take immediately before bedtime    . Investigational palbociclib (IBRANCE) 125 MG capsule [facility] AFT-05 [name] Take 1 capsule (125 mg total) by mouth daily. Take with food. Swallow whole. Do not chew. Take on days 1-21. Repeat every 28 days. 69 capsule 0  . omeprazole (PRILOSEC) 40 MG capsule Take 40 mg by mouth daily.  5  . Vitamin D, Ergocalciferol, (DRISDOL) 50000 UNITS CAPS capsule Take 50,000 Units by mouth every 7 (seven) days.     No current facility-administered medications for this visit.    PHYSICAL EXAMINATION: ECOG PERFORMANCE STATUS: 0 - Asymptomatic  Filed Vitals:   03/03/15 0910  BP: 117/65  Pulse: 80  Temp: 98 F (36.7 C)  Resp: 18   Filed Weights   03/03/15 0910  Weight: 156 lb 12.8 oz  (71.124 kg)    GENERAL:alert, no distress and comfortable SKIN: skin color, texture, turgor are normal, no rashes or significant lesions EYES: normal, Conjunctiva are pink and non-injected, sclera clear OROPHARYNX:no exudate, no erythema and lips, buccal mucosa, and tongue normal  NECK: supple, thyroid normal size, non-tender, without nodularity LYMPH:  no palpable lymphadenopathy in the cervical, axillary or inguinal LUNGS: clear to auscultation and percussion with normal breathing effort HEART: regular rate & rhythm and no murmurs and no lower extremity edema ABDOMEN:abdomen soft, non-tender and normal bowel sounds MUSCULOSKELETAL:no cyanosis of digits and no clubbing  NEURO: alert & oriented x 3 with fluent speech, no focal motor/sensory deficits EXTREMITIES: No lower extremity edema  LABORATORY DATA:  I have reviewed the data as listed   Chemistry      Component Value Date/Time   NA 143 03/03/2015 0852   NA 139 05/03/2014 1946   K 4.8 03/03/2015 0852   K 4.1 05/03/2014 1946   CL 103 05/03/2014 1946   CO2 25 03/03/2015 0852   CO2 26 05/03/2014 1946   BUN 11.1 03/03/2015 0852   BUN 17 05/03/2014 1946   CREATININE 0.8 03/03/2015 0852   CREATININE 0.77 05/03/2014 1946      Component Value Date/Time   CALCIUM 9.3 03/03/2015 0852   CALCIUM 9.6  05/03/2014 1946   ALKPHOS 64 03/03/2015 0852   ALKPHOS 76 09/14/2010 1708   AST 11 03/03/2015 0852   AST 18 09/14/2010 1708   ALT 12 03/03/2015 0852   ALT 21 09/14/2010 1708   BILITOT 0.41 03/03/2015 0852   BILITOT 0.4 09/14/2010 1708       Lab Results  Component Value Date   WBC 1.8* 03/03/2015   HGB 11.0* 03/03/2015   HCT 33.4* 03/03/2015   MCV 101.5* 03/03/2015   PLT 136* 03/03/2015   NEUTROABS 0.5* 03/03/2015     ASSESSMENT & PLAN:  Breast cancer of upper-outer quadrant of left female breast Left breast invasive ductal carcinoma grade 1; 4.8 cm with low-grade DCIS; margins negative 1/4 lymph nodes positive, ER 100%  PR 100% HER-2 negative ratio 1.18 Ki-67 26% and 19% T2 N1 stage IIB status post adjuvant chemotherapy with dose dense Adriamycin and Cytoxan 4 followed by Abraxane weekly 12 Completed Adjuvant radiation therapy 10/17/14  Current Treatment: [name] study: Randomized to Ibrance + Anastrozole Started 10/27/2014 Toxicities: 1. Neutropenia related to Ibrance: Held treatment 12/23/2014 and subsequently resumed treatment with recovery of blood counts. She completed 3 cycles of Ibrance and is here for cycle 4.  Today her [place] is 500 and she is neutropenic again. We instructed her to come back next week to recheck her blood counts. If her [place] is over 1000, we can resume Ibrance at 100 mg dosage.  Denies nausea vomiting diarrhea, constipation. Denies myalgias or arthralgias or stiffness  Plan: Cycle 4 Ibrance being held for 1 week based on clinical trial protocol. If her counts improve she will resume at a lower dosage of 100 mg Ibrance  RTC in 4 weeks for labs and follow-up.   No orders of the defined types were placed in this encounter.   The patient has a good understanding of the overall plan. she agrees with it. she will call with any problems that may develop before the next visit here.   Rulon Eisenmenger, MD 03/03/2015

## 2015-03-03 NOTE — Progress Notes (Signed)
Received mammo report from [facility], reviewed by Dr. Lindi Adie, sent to scan.

## 2015-03-04 ENCOUNTER — Other Ambulatory Visit: Payer: Self-pay | Admitting: *Deleted

## 2015-03-09 NOTE — Assessment & Plan Note (Signed)
Left breast invasive ductal carcinoma grade 1; 4.8 cm with low-grade DCIS; margins negative 1/4 lymph nodes positive, ER 100% PR 100% HER-2 negative ratio 1.18 Ki-67 26% and 19% T2 N1 stage IIB status post adjuvant chemotherapy with dose dense Adriamycin and Cytoxan 4 followed by Abraxane weekly 12 Completed Adjuvant radiation therapy 10/17/14  Current Treatment: [name] study: Randomized to Ibrance + Anastrozole Started 10/27/2014 Toxicities: 1. Neutropenia related to Ibrance: Held treatment 12/23/2014 and subsequently resumed treatment with recovery of blood counts. She completed 3 cycles of Ibrance and is here for cycle 4.  Today her [place] is 500 and she is neutropenic again. We instructed her to come back next week to recheck her blood counts. If her [place] is over 1000, we can resume Ibrance at 100 mg dosage.  Denies nausea vomiting diarrhea, constipation. Denies myalgias or arthralgias or stiffness  Plan: Cycle 4 Ibrance being held for 1 week based on clinical trial protocol. If her counts improve she will resume at a lower dosage of 100 mg Ibrance  RTC in 4 weeks for labs and follow-up.

## 2015-03-10 ENCOUNTER — Telehealth: Payer: Self-pay | Admitting: Hematology and Oncology

## 2015-03-10 ENCOUNTER — Other Ambulatory Visit (HOSPITAL_BASED_OUTPATIENT_CLINIC_OR_DEPARTMENT_OTHER): Payer: 59

## 2015-03-10 ENCOUNTER — Ambulatory Visit (HOSPITAL_BASED_OUTPATIENT_CLINIC_OR_DEPARTMENT_OTHER): Payer: 59 | Admitting: Hematology and Oncology

## 2015-03-10 ENCOUNTER — Encounter: Payer: Self-pay | Admitting: Hematology and Oncology

## 2015-03-10 ENCOUNTER — Encounter: Payer: Self-pay | Admitting: *Deleted

## 2015-03-10 VITALS — BP 120/70 | HR 73 | Temp 98.0°F | Resp 18 | Ht 65.0 in | Wt 156.3 lb

## 2015-03-10 DIAGNOSIS — H6593 Unspecified nonsuppurative otitis media, bilateral: Secondary | ICD-10-CM | POA: Diagnosis not present

## 2015-03-10 DIAGNOSIS — Z006 Encounter for examination for normal comparison and control in clinical research program: Secondary | ICD-10-CM | POA: Diagnosis not present

## 2015-03-10 DIAGNOSIS — C50412 Malignant neoplasm of upper-outer quadrant of left female breast: Secondary | ICD-10-CM

## 2015-03-10 DIAGNOSIS — Z9221 Personal history of antineoplastic chemotherapy: Secondary | ICD-10-CM

## 2015-03-10 DIAGNOSIS — Z923 Personal history of irradiation: Secondary | ICD-10-CM

## 2015-03-10 DIAGNOSIS — D701 Agranulocytosis secondary to cancer chemotherapy: Secondary | ICD-10-CM

## 2015-03-10 DIAGNOSIS — Z170 Estrogen receptor positive status [ER+]: Secondary | ICD-10-CM

## 2015-03-10 LAB — COMPREHENSIVE METABOLIC PANEL
ALT: 20 U/L (ref 0–55)
ANION GAP: 9 meq/L (ref 3–11)
AST: 19 U/L (ref 5–34)
Albumin: 3.7 g/dL (ref 3.5–5.0)
Alkaline Phosphatase: 68 U/L (ref 40–150)
BILIRUBIN TOTAL: 0.41 mg/dL (ref 0.20–1.20)
BUN: 12.5 mg/dL (ref 7.0–26.0)
CALCIUM: 9.2 mg/dL (ref 8.4–10.4)
CHLORIDE: 108 meq/L (ref 98–109)
CO2: 25 mEq/L (ref 22–29)
CREATININE: 0.8 mg/dL (ref 0.6–1.1)
EGFR: 81 mL/min/{1.73_m2} — ABNORMAL LOW (ref >90)
Glucose: 74 mg/dl (ref 70–140)
Potassium: 4.7 mEq/L (ref 3.5–5.1)
Sodium: 142 mEq/L (ref 136–145)
TOTAL PROTEIN: 6.3 g/dL — AB (ref 6.4–8.3)

## 2015-03-10 LAB — CBC WITH DIFFERENTIAL/PLATELET
BASO%: 3 % — AB (ref 0.0–2.0)
Basophils Absolute: 0.1 10*3/uL (ref 0.0–0.1)
EOS ABS: 0 10*3/uL (ref 0.0–0.5)
EOS%: 1.1 % (ref 0.0–7.0)
HEMATOCRIT: 34.8 % (ref 34.8–46.6)
HGB: 11.8 g/dL (ref 11.6–15.9)
LYMPH#: 1.1 10*3/uL (ref 0.9–3.3)
LYMPH%: 42.3 % (ref 14.0–49.7)
MCH: 34.6 pg — ABNORMAL HIGH (ref 25.1–34.0)
MCHC: 33.8 g/dL (ref 31.5–36.0)
MCV: 102.4 fL — ABNORMAL HIGH (ref 79.5–101.0)
MONO#: 0.5 10*3/uL (ref 0.1–0.9)
MONO%: 20.4 % — ABNORMAL HIGH (ref 0.0–14.0)
NEUT#: 0.8 10*3/uL — ABNORMAL LOW (ref 1.5–6.5)
NEUT%: 33.2 % — AB (ref 38.4–76.8)
PLATELETS: 293 10*3/uL (ref 145–400)
RBC: 3.4 10*6/uL — ABNORMAL LOW (ref 3.70–5.45)
RDW: 20.8 % — ABNORMAL HIGH (ref 11.2–14.5)
WBC: 2.5 10*3/uL — ABNORMAL LOW (ref 3.9–10.3)

## 2015-03-10 NOTE — Progress Notes (Signed)
Patient Care Team: Antony Contras, MD as PCP - General (Family Medicine) Elsie Stain, MD (Pulmonary Disease) Rolm Bookbinder, MD as Consulting Physician (General Surgery) Nicholas Lose, MD as Consulting Physician (Hematology and Oncology) [name], MD as Consulting Physician (Radiation Oncology) Rockwell Germany, RN as Registered Nurse Mauro Kaufmann, RN as Registered Nurse Holley Bouche, NP as Nurse Practitioner (Nurse Practitioner) Sylvan Cheese, NP as Nurse Practitioner (Hematology and Oncology)  DIAGNOSIS: Breast cancer of upper-outer quadrant of left female breast [facility])   Staging form: Breast, AJCC 7th Edition     Clinical stage from 02/13/2014: Stage IIB (T3, N0, M0) - Unsigned       Staging comments: Staged at breast conference on 1.20.16      Pathologic stage from 03/11/2014: Stage IIB (T2, N1a, cM0) - Signed by Seward Grater, MD on 03/19/2014       Staging comments: Staged on final mastectomy specimen by Dr. Avis Epley    SUMMARY OF ONCOLOGIC HISTORY:   Breast cancer of upper-outer quadrant of left female breast ([place])   01/30/2014 Mammogram Left breast: 2.4 cm irregular high density mass with indistinct margin, middle depth, 6.8 cm from nipple, with architectural distortion. There is also a 3.5 cm irregular high density asymmetry at 1:00, anterior depth, with arch distortion and thickening.   01/30/2014 Breast US Left breast: 3.7 cm irregular mass with indistinct margin at 2:00, posterior deth, 4 cm from nipple, hypoechoic. 10 x 1 cm irregular mass with lobulated and indistinct margin at 3:00, posterior depth. A third 0.9 cm lobulated mass at 1:00, posterior depth   02/04/2014 Initial Biopsy Left breast core needle bx x 3: Invasive ductal carcinoma with DCIS ER 100%, PR 100%, HER-2 negative, Ki-67 ranged from 19-26%; third biopsy showed invasive mammary cancer with lobular features and intraductal papilloma, ER/PR positive HER-2 negative   02/11/2014 Breast MRI  Left breast: 3 masses that are confluent measuring 5.6 x 2.7 x 3 cm extending to the left aspect of the areola, appearing to involve the skin of theareola, deviating the nipple towards the left.  No axillary findings.  Neg rightt breast.   02/13/2014 Clinical Stage Stage IIB: T3 N0   03/07/2014 Definitive Surgery Left mastectomy/SLNB: invasive ductal carcinoma grade 1; 4.8 cm with low-grade DCIS; margins negative 1/4 lymph nodes positive, ER 100% PR 100% HER-2 negative ratio 1.18 Ki-67 26% and 19%    03/07/2014 Pathologic Stage Stage IIB (T2 N1)   04/03/2014 - 08/13/2014 Chemotherapy Adjuvant chemotherapy with dose dense adriamycin and cytoxan followed by weekly abraxane 12   09/09/2014 - 10/17/2014 Radiation Therapy Adjuvant RT [name]): Left chest wall 50.4 Gy over 28 fractions. Left supraclavicular fossa and axilla: 45 Gy over 25 fractions   10/27/2014 -  Anti-estrogen oral therapy Anastrozole 1 mg daily plus Ibrance ([name] Clinical Trial)   01/16/2015 Survivorship Survivorship visit completed and copy of care plan provided to patient.    CHIEF COMPLIANT: Headaches and dizziness  INTERVAL HISTORY: Theresa Huff is a 65 year old with above-mentioned history of left breast cancer currently on adjuvant antiestrogen therapy on clinical trial [name]. She completed 3 cycles of treatment on cycle 4 was delayed because of neutropenia. Her ANC was 0.5 last week and it is 0.8 today. She complains today of headaches that are intermittent. She also has dizziness that she has noticed at work while in sitting position. There are no clear aggravating or relieving factors. It seems to last for a few minutes to sometimes up to  an hour and then gets better. She denies any cough or expectoration other than a mild sore throat.  REVIEW OF SYSTEMS:   Constitutional: Denies fevers, chills or abnormal weight loss Eyes: Denies blurriness of vision Ears, nose, mouth, throat, and face: Small amount of fluid behind both  eardrums Respiratory: Denies cough, dyspnea or wheezes Cardiovascular: Denies palpitation, chest discomfort Gastrointestinal:  Denies nausea, heartburn or change in bowel habits Skin: Denies abnormal skin rashes Lymphatics: Denies new lymphadenopathy or easy bruising Neurological:Denies numbness, tingling or new weaknesses Behavioral/Psych: Mood is stable, no new changes  Extremities: No lower extremity edema Breast:  denies any pain or lumps or nodules in either breasts All other systems were reviewed with the patient and are negative.  I have reviewed the past medical history, past surgical history, social history and family history with the patient and they are unchanged from previous note.  ALLERGIES:  is allergic to hydrocodone; codeine; iodine; primidone; radiaplexrx; sulfa antibiotics; and tape.  MEDICATIONS:  Current Outpatient Prescriptions  Medication Sig Dispense Refill  . anastrozole (ARIMIDEX) 1 MG tablet Take 1 tablet (1 mg total) by mouth daily. 90 tablet 3  . calcium carbonate (OS-CAL) 600 MG TABS tablet Take 600 mg by mouth daily.    . cetirizine (ZYRTEC) 10 MG tablet Take 10 mg by mouth daily.      . Cholecalciferol (VITAMIN D PO) Take 2,000 Units by mouth daily.    . DULoxetine (CYMBALTA) 30 MG capsule Take 30 mg by mouth daily.     . Eszopiclone (ESZOPICLONE) 3 MG TABS Take 3 mg by mouth at bedtime. Take immediately before bedtime    . Investigational palbociclib (IBRANCE) 125 MG capsule [facility] AFT-05 [name] Take 1 capsule (125 mg total) by mouth daily. Take with food. Swallow whole. Do not chew. Take on days 1-21. Repeat every 28 days. 69 capsule 0  . omeprazole (PRILOSEC) 40 MG capsule Take 40 mg by mouth daily.  5  . Vitamin D, Ergocalciferol, (DRISDOL) 50000 UNITS CAPS capsule Take 50,000 Units by mouth every 7 (seven) days.     No current facility-administered medications for this visit.    PHYSICAL EXAMINATION: ECOG PERFORMANCE STATUS: 1 -  Symptomatic but completely ambulatory  Filed Vitals:   03/10/15 0932  BP: 120/70  Pulse: 73  Temp: 98 F (36.7 C)  Resp: 18   Filed Weights   03/10/15 0932  Weight: 156 lb 4.8 oz (70.897 kg)    GENERAL:alert, no distress and comfortable SKIN: skin color, texture, turgor are normal, no rashes or significant lesions EYES: normal, Conjunctiva are pink and non-injected, sclera clear OROPHARYNX:no exudate, no erythema and lips, buccal mucosa, and tongue normal  NECK: supple, thyroid normal size, non-tender, without nodularity LYMPH:  no palpable lymphadenopathy in the cervical, axillary or inguinal LUNGS: clear to auscultation and percussion with normal breathing effort HEART: regular rate & rhythm and no murmurs and no lower extremity edema ABDOMEN:abdomen soft, non-tender and normal bowel sounds MUSCULOSKELETAL:no cyanosis of digits and no clubbing  NEURO: alert & oriented x 3 with fluent speech, no focal motor/sensory deficits EXTREMITIES: No lower extremity edema  LABORATORY DATA:  I have reviewed the data as listed   Chemistry      Component Value Date/Time   NA 142 03/10/2015 0911   NA 139 05/03/2014 1946   K 4.7 03/10/2015 0911   K 4.1 05/03/2014 1946   CL 103 05/03/2014 1946   CO2 25 03/10/2015 0911   CO2 26 05/03/2014 1946  BUN 12.5 03/10/2015 0911   BUN 17 05/03/2014 1946   CREATININE 0.8 03/10/2015 0911   CREATININE 0.77 05/03/2014 1946      Component Value Date/Time   CALCIUM 9.2 03/10/2015 0911   CALCIUM 9.6 05/03/2014 1946   ALKPHOS 68 03/10/2015 0911   ALKPHOS 76 09/14/2010 1708   AST 19 03/10/2015 0911   AST 18 09/14/2010 1708   ALT 20 03/10/2015 0911   ALT 21 09/14/2010 1708   BILITOT 0.41 03/10/2015 0911   BILITOT 0.4 09/14/2010 1708       Lab Results  Component Value Date   WBC 2.5* 03/10/2015   HGB 11.8 03/10/2015   HCT 34.8 03/10/2015   MCV 102.4* 03/10/2015   PLT 293 03/10/2015   NEUTROABS 0.8* 03/10/2015     ASSESSMENT & PLAN:    Breast cancer of upper-outer quadrant of left female breast Left breast invasive ductal carcinoma grade 1; 4.8 cm with low-grade DCIS; margins negative 1/4 lymph nodes positive, ER 100% PR 100% HER-2 negative ratio 1.18 Ki-67 26% and 19% T2 N1 stage IIB status post adjuvant chemotherapy with dose dense Adriamycin and Cytoxan 4 followed by Abraxane weekly 12 Completed Adjuvant radiation therapy 10/17/14  Current Treatment: [name] study: Randomized to Ibrance + Anastrozole Started 10/27/2014 Toxicities: 1. Neutropenia related to Ibrance: Held treatment 12/23/2014 and subsequently resumed treatment with recovery of blood counts. She completed 3 cycles of Ibrance Ibrance being held for neutropenia. ANC today is 800. It is recovering but still below 1000 that is required to resume Ibrance at 100 mg daily.  Headaches and dizziness: Over-the-counter headache medication was not fully resolved her headache. Dizziness is intermittent in nature. Exam revealed fluid behind both middle ear suspect viral URI  Denies nausea vomiting diarrhea, constipation. Denies myalgias or arthralgias or stiffness  Plan: Cycle 4 Ibrance being held for 1 more week based on clinical trial protocol. If her counts improve she will resume at a lower dosage of 100 mg Ibrance  RTC in 1 week for labs and follow-up.   Orders Placed This Encounter  Procedures  . CBC with Differential    Standing Status: Future     Number of Occurrences:      Standing Expiration Date: 03/09/2016  . Comprehensive metabolic panel    Standing Status: Future     Number of Occurrences:      Standing Expiration Date: 03/09/2016   The patient has a good understanding of the overall plan. she agrees with it. she will call with any problems that may develop before the next visit here.   Rulon Eisenmenger, MD 03/10/2015

## 2015-03-10 NOTE — Telephone Encounter (Signed)
Appointments made and avs printed °

## 2015-03-10 NOTE — Progress Notes (Signed)
03/10/15 at 11:23am- [name] - cycle 4, day 1 - (continued delay due to neutropenia)- The pt was into the cancer center this morning for her follow up appointment with Dr. Lindi Adie.  The pt's labs were drawn, and her ANC value was 0.8.  Therefore, the pt's study drug will continued to be held.  The pt returned her cycle 4 and cycle 5 study drug bottles for the drug accountability check.  The pt said that bottle 4 "came open" in her purse, and she is concerned that some of the capsules may be missing from the bottle.  The pt confirmed that she did not take/swallow any pills from bottle 4.  Bottle 5 study drug seal was intact.  Both of these bottles were taken to the pharmacy, and the pharmacist Raul Del) completed the accountability check.  Bottle 4 had only 18 pills in the bottle.  The pt also returned her medication calendars for cycles 4 and 5.  The pt was given her cycle 4 anti-hormonal therapy medication calendar and instructed to continue reporting her daily anti-hormone medication.  The pt verbalized understanding.  The pt denied any new medications.  The pt did complain of a persistent headache that began last Tuesday, 03/04/15.  She said that the headache was a "moderate" headache-grade 2.  The pt also complained of intermittent episodes of dizziness on 03/05/15 and 03/06/15.  The pt described the dizziness as "moderate".   The pt said that the dizziness occurred even when she was sitting last week.  Dr. Lindi Adie examined the pt, and he noted some mild fluid behind her ears which may be contributing to her dizziness.  Dr. Lindi Adie did not feel that her new AE's of headache and dizziness were related to her study drug, palbociclib.    The pt was informed that her study drug will be held another week.  Dr. Lindi Adie will see the pt back on 03/18/15 to re-check her labs.   Brion Aliment RN, BSN,CCRP Clinical Research Nurse 03/10/2015 11:45 AM

## 2015-03-17 NOTE — Assessment & Plan Note (Signed)
Left breast invasive ductal carcinoma grade 1; 4.8 cm with low-grade DCIS; margins negative 1/4 lymph nodes positive, ER 100% PR 100% HER-2 negative ratio 1.18 Ki-67 26% and 19% T2 N1 stage IIB status post adjuvant chemotherapy with dose dense Adriamycin and Cytoxan 4 followed by Abraxane weekly 12 Completed Adjuvant radiation therapy 10/17/14  Current Treatment: [name] study: Randomized to Ibrance + Anastrozole Started 10/27/2014 Toxicities: 1. Neutropenia related to Ibrance: Held treatment 12/23/2014 and subsequently resumed treatment with recovery of blood counts. She completed 3 cycles of Ibrance Ibrance being held for neutropenia. ANC today is . It is recovering but still below 1000 that is required to resume Ibrance at 100 mg daily.  Headaches and dizziness: Over-the-counter headache medication was not fully resolved her headache. Dizziness is intermittent in nature. Exam revealed fluid behind both middle ear suspect viral URI  Denies nausea vomiting diarrhea, constipation. Denies myalgias or arthralgias or stiffness  Plan: She will resume at a lower dosage of 100 mg Ibrance  RTC in 1 week for labs and follow-up.

## 2015-03-18 ENCOUNTER — Telehealth: Payer: Self-pay | Admitting: Hematology and Oncology

## 2015-03-18 ENCOUNTER — Ambulatory Visit (HOSPITAL_BASED_OUTPATIENT_CLINIC_OR_DEPARTMENT_OTHER): Payer: 59 | Admitting: Hematology and Oncology

## 2015-03-18 ENCOUNTER — Encounter: Payer: Self-pay | Admitting: Medical Oncology

## 2015-03-18 ENCOUNTER — Other Ambulatory Visit (HOSPITAL_BASED_OUTPATIENT_CLINIC_OR_DEPARTMENT_OTHER): Payer: 59

## 2015-03-18 ENCOUNTER — Encounter: Payer: Self-pay | Admitting: Hematology and Oncology

## 2015-03-18 VITALS — BP 114/56 | HR 83 | Temp 98.0°F | Resp 18 | Ht 65.0 in | Wt 153.3 lb

## 2015-03-18 DIAGNOSIS — R42 Dizziness and giddiness: Secondary | ICD-10-CM

## 2015-03-18 DIAGNOSIS — Z006 Encounter for examination for normal comparison and control in clinical research program: Secondary | ICD-10-CM

## 2015-03-18 DIAGNOSIS — R51 Headache: Secondary | ICD-10-CM | POA: Diagnosis not present

## 2015-03-18 DIAGNOSIS — C50412 Malignant neoplasm of upper-outer quadrant of left female breast: Secondary | ICD-10-CM

## 2015-03-18 DIAGNOSIS — D701 Agranulocytosis secondary to cancer chemotherapy: Secondary | ICD-10-CM | POA: Diagnosis not present

## 2015-03-18 LAB — CBC WITH DIFFERENTIAL/PLATELET
BASO%: 2.8 % — AB (ref 0.0–2.0)
Basophils Absolute: 0.1 10*3/uL (ref 0.0–0.1)
EOS%: 1.9 % (ref 0.0–7.0)
Eosinophils Absolute: 0.1 10*3/uL (ref 0.0–0.5)
HCT: 39.3 % (ref 34.8–46.6)
HGB: 13.1 g/dL (ref 11.6–15.9)
LYMPH%: 27.2 % (ref 14.0–49.7)
MCH: 33.8 pg (ref 25.1–34.0)
MCHC: 33.2 g/dL (ref 31.5–36.0)
MCV: 101.9 fL — AB (ref 79.5–101.0)
MONO#: 0.6 10*3/uL (ref 0.1–0.9)
MONO%: 13.7 % (ref 0.0–14.0)
NEUT%: 54.4 % (ref 38.4–76.8)
NEUTROS ABS: 2.2 10*3/uL (ref 1.5–6.5)
Platelets: 341 10*3/uL (ref 145–400)
RBC: 3.86 10*6/uL (ref 3.70–5.45)
RDW: 18.1 % — ABNORMAL HIGH (ref 11.2–14.5)
WBC: 4.1 10*3/uL (ref 3.9–10.3)
lymph#: 1.1 10*3/uL (ref 0.9–3.3)

## 2015-03-18 LAB — COMPREHENSIVE METABOLIC PANEL
ALT: 34 U/L (ref 0–55)
AST: 27 U/L (ref 5–34)
Albumin: 3.8 g/dL (ref 3.5–5.0)
Alkaline Phosphatase: 74 U/L (ref 40–150)
Anion Gap: 10 mEq/L (ref 3–11)
BILIRUBIN TOTAL: 0.62 mg/dL (ref 0.20–1.20)
BUN: 13.3 mg/dL (ref 7.0–26.0)
CO2: 25 meq/L (ref 22–29)
Calcium: 9.4 mg/dL (ref 8.4–10.4)
Chloride: 107 mEq/L (ref 98–109)
Creatinine: 0.8 mg/dL (ref 0.6–1.1)
EGFR: 74 mL/min/{1.73_m2} — AB (ref >90)
GLUCOSE: 93 mg/dL (ref 70–140)
Potassium: 4.5 mEq/L (ref 3.5–5.1)
SODIUM: 142 meq/L (ref 136–145)
TOTAL PROTEIN: 6.6 g/dL (ref 6.4–8.3)

## 2015-03-18 MED ORDER — INV-PALBOCICLIB 100 MG CAPS #23 ALLIANCE FOUNDATION AFT-05 (PALLAS): 100.0000 mg | ORAL_CAPSULE | Freq: Every day | ORAL | Status: DC

## 2015-03-18 NOTE — Telephone Encounter (Signed)
appt made and avs printed °

## 2015-03-18 NOTE — Progress Notes (Signed)
[name]: cycle 4, day 1  Patient here today for recheck of labs and MD assessment. Cycle 4 was held, per study protocol, on (2/6) due to grade 3 neutropenia. Lab's re-checked again on 2/20 patient held one more week. Today's ANC @ 2.2 and WBC @ 4.1, within  treatment parameters. Per protocol, patient dose reduced to Palbociclib 100 mg to complete cycle 4 and 5. Patient to return for labs and MD visit for Cycle 6 on 4/17. Today patient was dispensed study drug Palbociclib 100 mg along with medication diaries for cycle 4 and 5 and instructed to start palbociclib today with daily documentation of both study medication and anti-hormone therapy on the new medication diaries provided, patient gave verbal understanding. Per MD assessment, patient's headaches resolved, she denied nausea and vomiting as well as fatigue and myalgias and no new symptoms reported. Per MD assessment of patient and all labs drawn today, patient cleared to start Cycle 4. All patient's questions answered at this time and patient was encouraged to call MD/clinic with any questions or concerns she may have. PharmD, Trixie Rude dispensed study generated assigned bottle numbers for Cycle 4 and 5. Patient returned today, 4 pills from bottle 4 that had spilled into her purse. She returned bottle 4 last week, as noted by Doristine Johns on 2/20 (see note) with 18 pills, with today's 4 pills returned, total of study drug medication returned is #22. These 4 additional study medications were given to [place].  Adele Dan, RN, BSN Clinical Research 03/18/2015 8:59 AM

## 2015-03-18 NOTE — Progress Notes (Signed)
Patient Care Team: Antony Contras, MD as PCP - General (Family Medicine) Elsie Stain, MD (Pulmonary Disease) Rolm Bookbinder, MD as Consulting Physician (General Surgery) Nicholas Lose, MD as Consulting Physician (Hematology and Oncology) [name], MD as Consulting Physician (Radiation Oncology) Rockwell Germany, RN as Registered Nurse Mauro Kaufmann, RN as Registered Nurse Holley Bouche, NP as Nurse Practitioner (Nurse Practitioner) Sylvan Cheese, NP as Nurse Practitioner (Hematology and Oncology)  DIAGNOSIS: Breast cancer of upper-outer quadrant of left female breast [facility])   Staging form: Breast, AJCC 7th Edition     Clinical stage from 02/13/2014: Stage IIB (T3, N0, M0) - Unsigned       Staging comments: Staged at breast conference on 1.20.16      Pathologic stage from 03/11/2014: Stage IIB (T2, N1a, cM0) - Signed by Seward Grater, MD on 03/19/2014       Staging comments: Staged on final mastectomy specimen by Dr. Avis Epley  SUMMARY OF ONCOLOGIC HISTORY:   Breast cancer of upper-outer quadrant of left female breast ([place])   01/30/2014 Mammogram Left breast: 2.4 cm irregular high density mass with indistinct margin, middle depth, 6.8 cm from nipple, with architectural distortion. There is also a 3.5 cm irregular high density asymmetry at 1:00, anterior depth, with arch distortion and thickening.   01/30/2014 Breast US Left breast: 3.7 cm irregular mass with indistinct margin at 2:00, posterior deth, 4 cm from nipple, hypoechoic. 10 x 1 cm irregular mass with lobulated and indistinct margin at 3:00, posterior depth. A third 0.9 cm lobulated mass at 1:00, posterior depth   02/04/2014 Initial Biopsy Left breast core needle bx x 3: Invasive ductal carcinoma with DCIS ER 100%, PR 100%, HER-2 negative, Ki-67 ranged from 19-26%; third biopsy showed invasive mammary cancer with lobular features and intraductal papilloma, ER/PR positive HER-2 negative   02/11/2014 Breast MRI Left  breast: 3 masses that are confluent measuring 5.6 x 2.7 x 3 cm extending to the left aspect of the areola, appearing to involve the skin of theareola, deviating the nipple towards the left.  No axillary findings.  Neg rightt breast.   02/13/2014 Clinical Stage Stage IIB: T3 N0   03/07/2014 Definitive Surgery Left mastectomy/SLNB: invasive ductal carcinoma grade 1; 4.8 cm with low-grade DCIS; margins negative 1/4 lymph nodes positive, ER 100% PR 100% HER-2 negative ratio 1.18 Ki-67 26% and 19%    03/07/2014 Pathologic Stage Stage IIB (T2 N1)   04/03/2014 - 08/13/2014 Chemotherapy Adjuvant chemotherapy with dose dense adriamycin and cytoxan followed by weekly abraxane 12   09/09/2014 - 10/17/2014 Radiation Therapy Adjuvant RT [name]): Left chest wall 50.4 Gy over 28 fractions. Left supraclavicular fossa and axilla: 45 Gy over 25 fractions   10/27/2014 -  Anti-estrogen oral therapy Anastrozole 1 mg daily plus Ibrance ([name] Clinical Trial)   01/16/2015 Survivorship Survivorship visit completed and copy of care plan provided to patient.    CHIEF COMPLIANT: Follow-up on clinical trial [name] , patient on Ibrance with anastrozole  INTERVAL HISTORY: Theresa Huff is a 65 year old with above-mentioned history of left breast cancer currently on adjuvant antiestrogen therapy with anastrozole along with Palbociclib. Palbociclib has been held previously for low neutrophil count. Today she is here again to recheck her white blood cell count and to resume treatment if her blood counts have improved at a lower dosage. She reports no problems or concerns she is feeling quite well. She goes to work every day. She had mild headaches which have also resolved.  Does not have any fatigue or nausea or vomiting.  REVIEW OF SYSTEMS:   Constitutional: Denies fevers, chills or abnormal weight loss Eyes: Denies blurriness of vision Ears, nose, mouth, throat, and face: Denies mucositis or sore throat Respiratory: Denies cough,  dyspnea or wheezes Cardiovascular: Denies palpitation, chest discomfort Gastrointestinal:  Denies nausea, heartburn or change in bowel habits Skin: Denies abnormal skin rashes Lymphatics: Denies new lymphadenopathy or easy bruising Neurological:Denies numbness, tingling or new weaknesses Behavioral/Psych: Mood is stable, no new changes  Extremities: No lower extremity edema Breast:  denies any pain or lumps or nodules in either breasts All other systems were reviewed with the patient and are negative.  I have reviewed the past medical history, past surgical history, social history and family history with the patient and they are unchanged from previous note.  ALLERGIES:  is allergic to hydrocodone; codeine; iodine; primidone; radiaplexrx; sulfa antibiotics; and tape.  MEDICATIONS:  Current Outpatient Prescriptions  Medication Sig Dispense Refill  . anastrozole (ARIMIDEX) 1 MG tablet Take 1 tablet (1 mg total) by mouth daily. 90 tablet 3  . calcium carbonate (OS-CAL) 600 MG TABS tablet Take 600 mg by mouth daily.    . cetirizine (ZYRTEC) 10 MG tablet Take 10 mg by mouth daily.      . Cholecalciferol (VITAMIN D PO) Take 2,000 Units by mouth daily.    . DULoxetine (CYMBALTA) 30 MG capsule Take 30 mg by mouth daily.     . Eszopiclone (ESZOPICLONE) 3 MG TABS Take 3 mg by mouth at bedtime. Take immediately before bedtime    . Investigational palbociclib (IBRANCE) 125 MG capsule [facility] AFT-05 [name] Take 1 capsule (125 mg total) by mouth daily. Take with food. Swallow whole. Do not chew. Take on days 1-21. Repeat every 28 days. 69 capsule 0  . omeprazole (PRILOSEC) 40 MG capsule Take 40 mg by mouth daily.  5  . Vitamin D, Ergocalciferol, (DRISDOL) 50000 UNITS CAPS capsule Take 50,000 Units by mouth every 7 (seven) days.    . Investigational palbociclib (IBRANCE) 100 MG capsule [facility] AFT-05 [name] Take 1 capsule (100 mg total) by mouth daily. Take with food. Swallow  whole. Do not chew. Take on days 1-21. Repeat every 28 days. 42 capsule 0   No current facility-administered medications for this visit.    PHYSICAL EXAMINATION: ECOG PERFORMANCE STATUS: 0 - Asymptomatic  Filed Vitals:   03/18/15 0832  BP: 114/56  Pulse: 83  Temp: 98 F (36.7 C)  Resp: 18   Filed Weights   03/18/15 0832  Weight: 153 lb 4.8 oz (69.536 kg)    GENERAL:alert, no distress and comfortable SKIN: skin color, texture, turgor are normal, no rashes or significant lesions EYES: normal, Conjunctiva are pink and non-injected, sclera clear OROPHARYNX:no exudate, no erythema and lips, buccal mucosa, and tongue normal  NECK: supple, thyroid normal size, non-tender, without nodularity LYMPH:  no palpable lymphadenopathy in the cervical, axillary or inguinal LUNGS: clear to auscultation and percussion with normal breathing effort HEART: regular rate & rhythm and no murmurs and no lower extremity edema ABDOMEN:abdomen soft, non-tender and normal bowel sounds MUSCULOSKELETAL:no cyanosis of digits and no clubbing  NEURO: alert & oriented x 3 with fluent speech, no focal motor/sensory deficits EXTREMITIES: No lower extremity edema  LABORATORY DATA:  I have reviewed the data as listed   Chemistry      Component Value Date/Time   NA 142 03/10/2015 0911   NA 139 05/03/2014 1946   K 4.7 03/10/2015  0911   K 4.1 05/03/2014 1946   CL 103 05/03/2014 1946   CO2 25 03/10/2015 0911   CO2 26 05/03/2014 1946   BUN 12.5 03/10/2015 0911   BUN 17 05/03/2014 1946   CREATININE 0.8 03/10/2015 0911   CREATININE 0.77 05/03/2014 1946      Component Value Date/Time   CALCIUM 9.2 03/10/2015 0911   CALCIUM 9.6 05/03/2014 1946   ALKPHOS 68 03/10/2015 0911   ALKPHOS 76 09/14/2010 1708   AST 19 03/10/2015 0911   AST 18 09/14/2010 1708   ALT 20 03/10/2015 0911   ALT 21 09/14/2010 1708   BILITOT 0.41 03/10/2015 0911   BILITOT 0.4 09/14/2010 1708     Lab Results  Component Value Date    WBC 4.1 03/18/2015   HGB 13.1 03/18/2015   HCT 39.3 03/18/2015   MCV 101.9* 03/18/2015   PLT 341 03/18/2015   NEUTROABS 2.2 03/18/2015   ASSESSMENT & PLAN:  Breast cancer of upper-outer quadrant of left female breast Left breast invasive ductal carcinoma grade 1; 4.8 cm with low-grade DCIS; margins negative 1/4 lymph nodes positive, ER 100% PR 100% HER-2 negative ratio 1.18 Ki-67 26% and 19% T2 N1 stage IIB status post adjuvant chemotherapy with dose dense Adriamycin and Cytoxan 4 followed by Abraxane weekly 12 Completed Adjuvant radiation therapy 10/17/14  Current Treatment: [name] study: Randomized to Ibrance + Anastrozole Started 10/27/2014 Toxicities: 1. Neutropenia related to Ibrance: Held treatment 12/23/2014 and subsequently resumed treatment with recovery of blood counts. She completed 3 cycles of Ibrance Ibrance being held again for neutropenia. ANC today is 2200. We will resume Ibrance at 100 mg daily from today which is cycle 4.  Headaches and dizziness:Much improved  Denies nausea vomiting diarrhea, constipation. Denies myalgias or arthralgias or stiffness  Plan: She will resume at a lower dosage of 100 mg Ibrance  RTC in 2 months for labs and follow-up.   Orders Placed This Encounter  Procedures  . CBC with Differential    Standing Status: Future     Number of Occurrences:      Standing Expiration Date: 03/17/2016  . Comprehensive metabolic panel    Standing Status: Future     Number of Occurrences:      Standing Expiration Date: 03/17/2016   The patient has a good understanding of the overall plan. she agrees with it. she will call with any problems that may develop before the next visit here.   Rulon Eisenmenger, MD 03/18/2015

## 2015-04-09 ENCOUNTER — Ambulatory Visit: Payer: Worker's Compensation

## 2015-04-09 ENCOUNTER — Ambulatory Visit (INDEPENDENT_AMBULATORY_CARE_PROVIDER_SITE_OTHER): Payer: Worker's Compensation | Admitting: Physician Assistant

## 2015-04-09 VITALS — BP 124/80 | HR 91 | Temp 97.6°F | Resp 17 | Ht 64.57 in | Wt 155.0 lb

## 2015-04-09 DIAGNOSIS — S6992XA Unspecified injury of left wrist, hand and finger(s), initial encounter: Secondary | ICD-10-CM | POA: Diagnosis not present

## 2015-04-09 DIAGNOSIS — S61209A Unspecified open wound of unspecified finger without damage to nail, initial encounter: Secondary | ICD-10-CM | POA: Diagnosis not present

## 2015-04-09 NOTE — Progress Notes (Signed)
[facility] [address], [place] [address] [phone]  Date:  04/09/2015   Name:  Theresa Huff   DOB:  09-10-1950   MRN:  [id number]  PCP:  Gara Kroner, MD    Chief Complaint: Finger Injury   History of Present Illness:  This is a 65 y.o. female who is presenting with left ring finger injury that occurred at work. Injury occurred 3 weeks ago on 03/19/15. 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 When she walked away the door started to fall. She tried to catch the door but fell and door and chain landed on finger. She did not get seen until now. Pain is at distal ring finger. There is some redness present. Overall, swelling and pain have improved but not resolving. She has not taken anything for her symptoms. She denies paresthesias, weakness, decreased movement.   Review of Systems:  Review of Systems See HPI  Meds reviewed   Allergies  Allergen Reactions  . Hydrocodone Nausea And Vomiting  . Codeine Nausea And Vomiting  . Iodine Hives  . Primidone Itching and Nausea And Vomiting  . Radiaplexrx [Skin Protectants, Misc.] Other (See Comments)    Contraindicated on pharmacy profile  . Sulfa Antibiotics Hives  . Tape Other (See Comments)    Redness, skin tear   Medication list has been reviewed and updated.  Physical Examination:  Physical Exam  Constitutional: She is oriented to person, place, and time. She appears well-developed and well-nourished. No distress.  HENT:  Head: Normocephalic and atraumatic.  Right Ear: Hearing normal.  Left Ear: Hearing normal.  Nose: Nose normal.  Eyes: Conjunctivae and lids are normal. Right eye exhibits no discharge. Left eye exhibits no discharge. No scleral icterus.  Cardiovascular: Normal rate, regular rhythm and normal pulses.   Pulmonary/Chest: Effort normal. No respiratory distress.  Musculoskeletal: Normal range of motion.   Right hand: Normal.       Left hand: She exhibits tenderness (DIP 4th finger), bony tenderness and swelling (mild, DIP. Mild erythema over this area as well.). She exhibits normal range of motion and normal capillary refill. Normal sensation noted. Normal strength noted.  Neurological: She is alert and oriented to person, place, and time.  Skin: Skin is warm, dry and intact.  Psychiatric: She has a normal mood and affect. Her speech is normal and behavior is normal. Thought content normal.   BP 124/80 mmHg  Pulse 91  Temp(Src) 97.6 F (36.4 C) (Oral)  Resp 17  Ht 5' 4.57" (1.64 m)  Wt 155 lb (70.308 kg)  BMI 26.14 kg/m2  SpO2 94%  Dg Finger Ring Left  04/09/2015  ADDENDUM REPORT: 04/09/2015 11:56 ADDENDUM: Upon further review and Ree window wing and no in the area of concern, there appears to be a lucency at the base of the left ring finger distal phalanx, likely small avulsion fracture. Electronically Signed   By: Rolm Baptise M.D.   On: 04/09/2015 11:56  04/09/2015  CLINICAL DATA:  Left ring finger injury at work EXAM: LEFT RING FINGER 2+V COMPARISON:  None. FINDINGS: Mild joint space narrowing in the IP joints of the left ring finger. No acute bony abnormality. Specifically, no fracture, subluxation, or dislocation. Soft tissues are intact. IMPRESSION: No acute bony abnormality. Electronically Signed: By: Rolm Baptise M.D. On: 04/09/2015 09:40    Assessment and Plan:  1. Finger injury, left, initial encounter  2. Finger avulsion, initial encoutner Avulsion fracture of DIP of 4th finger. Fit for splint. Counseled on RICE therapy. She will return in 10-14 days for recheck. If symptoms improved at that time, she may stop wearing splint and work on ROM exercises. - DG Finger Ring Left; Future   [name]. [name], MHS [facility]  04/09/2015

## 2015-04-09 NOTE — Patient Instructions (Signed)
Wear the splint for 10 days. Return on 3/27 for follow up.

## 2015-04-29 ENCOUNTER — Ambulatory Visit (INDEPENDENT_AMBULATORY_CARE_PROVIDER_SITE_OTHER): Payer: Worker's Compensation | Admitting: Physician Assistant

## 2015-04-29 VITALS — BP 110/60 | HR 89 | Temp 98.1°F | Resp 16 | Ht 63.25 in | Wt 158.0 lb

## 2015-04-29 DIAGNOSIS — S61209D Unspecified open wound of unspecified finger without damage to nail, subsequent encounter: Secondary | ICD-10-CM

## 2015-04-29 NOTE — Progress Notes (Signed)
[facility] [address] [place] [address] [phone]  Date:  04/29/2015   Name:  Theresa Huff   DOB:  1951/01/15   MRN:  [id number]  PCP:  Gara Kroner, MD    Chief Complaint: Follow-up   History of Present Illness:  This is a 65 y.o. female who is presenting for follow up left 4th finger avulsion fracture. Injury occurred on 03/19/15 at work. She was not seen for injury until 3/15. Injury occurred when her finger hit the ground when she was trying to stop a garage store from falling and a chain fell on top of her finger. I instructed pt to wear finger brace for 1 week and return today for follow up. She states she has been using brace intermittently. Finds that it is hard to type with it on so she often was not wearing it during the day at work. She is still having some pain, esp with flexion. Pain and swelling overall improving though. She denies weakness, paresthesias.  Review of Systems:  Review of Systems See HPI  Problem list reviewed  Home meds reviewed   Allergies  Allergen Reactions  . Hydrocodone Nausea And Vomiting  . Codeine Nausea And Vomiting  . Iodine Hives  . Primidone Itching and Nausea And Vomiting  . Radiaplexrx [Skin Protectants, Misc.] Other (See Comments)    Contraindicated on pharmacy profile  . Sulfa Antibiotics Hives  . Tape Other (See Comments)    Redness, skin tear     Medication list has been reviewed and updated.  Physical Examination:  Physical Exam  Constitutional: She is oriented to person, place, and time. She appears well-developed and well-nourished. No distress.  HENT:  Head: Normocephalic and atraumatic.  Right Ear: Hearing normal.  Left Ear: Hearing normal.  Nose: Nose normal.  Eyes: Conjunctivae and lids are normal. Right eye exhibits no discharge. Left eye exhibits no discharge. No scleral icterus.  Cardiovascular: Normal rate, regular rhythm and normal pulses.   Pulmonary/Chest: Effort  normal. No respiratory distress.  Musculoskeletal: Normal range of motion.       Right hand: Normal.  Mild swelling at left 4th finger DIP. Mild ttp at lateral DIP. No decreased ROM. No decreased sensation or strength  Neurological: She is alert and oriented to person, place, and time.  Skin: Skin is warm, dry and intact. No lesion and no rash noted.  Psychiatric: She has a normal mood and affect. Her speech is normal and behavior is normal. Thought content normal.   BP 110/60 mmHg  Pulse 89  Temp(Src) 98.1 F (36.7 C) (Oral)  Resp 16  Ht 5' 3.25" (1.607 m)  Wt 158 lb (71.668 kg)  BMI 27.75 kg/m2  SpO2 98%  Assessment and Plan:  1. Finger avulsion, subsequent encounter Symptoms improving. She is >4 weeks from injury at this point. She can stop wearing brace. Focus on ROM exercises. RTW without restrictions. Return only as needed.   [name] [name], MHS [facility]  04/29/2015

## 2015-04-29 NOTE — Patient Instructions (Signed)
     IF you received an x-ray today, you will receive an invoice from [facility]. Please contact [facility] at [phone] with questions or concerns regarding your invoice.   IF you received labwork today, you will receive an invoice from [facility]. Please contact [facility] at [phone] with questions or concerns regarding your invoice.   Our billing staff will not be able to assist you with questions regarding bills from these companies.  You will be contacted with the lab results as soon as they are available. The fastest way to get your results is to activate your My Chart account. Instructions are located on the last page of this paperwork. If you have not heard from us regarding the results in 2 weeks, please contact this office.      

## 2015-05-07 ENCOUNTER — Other Ambulatory Visit: Payer: Self-pay | Admitting: Hematology and Oncology

## 2015-05-08 ENCOUNTER — Other Ambulatory Visit: Payer: Self-pay | Admitting: Medical Oncology

## 2015-05-08 ENCOUNTER — Other Ambulatory Visit: Payer: Self-pay | Admitting: *Deleted

## 2015-05-08 DIAGNOSIS — C50412 Malignant neoplasm of upper-outer quadrant of left female breast: Secondary | ICD-10-CM

## 2015-05-08 MED ORDER — SUMATRIPTAN SUCCINATE 50 MG PO TABS: 50.0000 mg | ORAL_TABLET | ORAL | Status: DC | PRN

## 2015-05-12 ENCOUNTER — Encounter: Payer: Self-pay | Admitting: Hematology and Oncology

## 2015-05-12 ENCOUNTER — Ambulatory Visit (HOSPITAL_BASED_OUTPATIENT_CLINIC_OR_DEPARTMENT_OTHER): Payer: 59 | Admitting: Hematology and Oncology

## 2015-05-12 ENCOUNTER — Other Ambulatory Visit (HOSPITAL_BASED_OUTPATIENT_CLINIC_OR_DEPARTMENT_OTHER): Payer: 59

## 2015-05-12 ENCOUNTER — Encounter: Payer: Self-pay | Admitting: Medical Oncology

## 2015-05-12 ENCOUNTER — Telehealth: Payer: Self-pay | Admitting: Hematology and Oncology

## 2015-05-12 VITALS — BP 124/67 | HR 74 | Temp 97.8°F | Resp 18 | Ht 63.25 in | Wt 157.0 lb

## 2015-05-12 DIAGNOSIS — C50412 Malignant neoplasm of upper-outer quadrant of left female breast: Secondary | ICD-10-CM

## 2015-05-12 DIAGNOSIS — Z006 Encounter for examination for normal comparison and control in clinical research program: Secondary | ICD-10-CM | POA: Diagnosis not present

## 2015-05-12 DIAGNOSIS — Z79811 Long term (current) use of aromatase inhibitors: Secondary | ICD-10-CM

## 2015-05-12 DIAGNOSIS — J209 Acute bronchitis, unspecified: Secondary | ICD-10-CM | POA: Diagnosis not present

## 2015-05-12 LAB — CBC WITH DIFFERENTIAL/PLATELET
BASO%: 1 % (ref 0.0–2.0)
Basophils Absolute: 0 10*3/uL (ref 0.0–0.1)
EOS ABS: 0.1 10*3/uL (ref 0.0–0.5)
EOS%: 1.9 % (ref 0.0–7.0)
HEMATOCRIT: 33.7 % — AB (ref 34.8–46.6)
HGB: 11.4 g/dL — ABNORMAL LOW (ref 11.6–15.9)
LYMPH#: 0.9 10*3/uL (ref 0.9–3.3)
LYMPH%: 30 % (ref 14.0–49.7)
MCH: 34.4 pg — ABNORMAL HIGH (ref 25.1–34.0)
MCHC: 33.8 g/dL (ref 31.5–36.0)
MCV: 101.8 fL — ABNORMAL HIGH (ref 79.5–101.0)
MONO#: 0.3 10*3/uL (ref 0.1–0.9)
MONO%: 10.5 % (ref 0.0–14.0)
NEUT%: 56.6 % (ref 38.4–76.8)
NEUTROS ABS: 1.8 10*3/uL (ref 1.5–6.5)
Platelets: 152 10*3/uL (ref 145–400)
RBC: 3.31 10*6/uL — ABNORMAL LOW (ref 3.70–5.45)
RDW: 14.9 % — AB (ref 11.2–14.5)
WBC: 3.1 10*3/uL — AB (ref 3.9–10.3)
nRBC: 0 % (ref 0–0)

## 2015-05-12 LAB — COMPREHENSIVE METABOLIC PANEL
AST: 11 U/L (ref 5–34)
Albumin: 3.7 g/dL (ref 3.5–5.0)
Alkaline Phosphatase: 59 U/L (ref 40–150)
Anion Gap: 9 mEq/L (ref 3–11)
BUN: 11.5 mg/dL (ref 7.0–26.0)
CALCIUM: 9.2 mg/dL (ref 8.4–10.4)
CHLORIDE: 109 meq/L (ref 98–109)
CO2: 25 meq/L (ref 22–29)
CREATININE: 0.8 mg/dL (ref 0.6–1.1)
EGFR: 80 mL/min/{1.73_m2} — ABNORMAL LOW (ref >90)
GLUCOSE: 87 mg/dL (ref 70–140)
Potassium: 4 mEq/L (ref 3.5–5.1)
SODIUM: 143 meq/L (ref 136–145)
Total Bilirubin: 0.44 mg/dL (ref 0.20–1.20)
Total Protein: 6.4 g/dL (ref 6.4–8.3)

## 2015-05-12 LAB — RESEARCH LABS

## 2015-05-12 MED ORDER — INV-PALBOCICLIB 100 MG CAPS #23 ALLIANCE FOUNDATION AFT-05 (PALLAS): 100.0000 mg | ORAL_CAPSULE | Freq: Every day | ORAL | Status: DC

## 2015-05-12 MED ORDER — AMOXICILLIN-POT CLAVULANATE 875-125 MG PO TABS: 1.0000 | ORAL_TABLET | Freq: Two times a day (BID) | ORAL | Status: DC

## 2015-05-12 NOTE — Assessment & Plan Note (Signed)
Left breast invasive ductal carcinoma grade 1; 4.8 cm with low-grade DCIS; margins negative 1/4 lymph nodes positive, ER 100% PR 100% HER-2 negative ratio 1.18 Ki-67 26% and 19% T2 N1 stage IIB status post adjuvant chemotherapy with dose dense Adriamycin and Cytoxan 4 followed by Abraxane weekly 12 Completed Adjuvant radiation therapy 10/17/14  Current Treatment: [name] study: Randomized to Ibrance + Anastrozole Started 10/27/2014; Today starts cycle 6 Toxicities: 1. Neutropenia related to Ibrance: Held treatment 12/23/2014 and subsequently resumed treatment with recovery of blood counts. Ibrance held again for neutropenia and resumed at 100 mg daily from cycle 4.  Headaches and dizziness:Much improved  Denies nausea vomiting diarrhea, constipation. Denies myalgias or arthralgias or stiffness  Plan:continue Ibrance at 100 mg.  RTC in 3 months for labs and follow-up.

## 2015-05-12 NOTE — Progress Notes (Signed)
Patient Care Team: Antony Contras, MD as PCP - General (Family Medicine) Elsie Stain, MD (Pulmonary Disease) Rolm Bookbinder, MD as Consulting Physician (General Surgery) Nicholas Lose, MD as Consulting Physician (Hematology and Oncology) [name], MD as Consulting Physician (Radiation Oncology) Rockwell Germany, RN as Registered Nurse Mauro Kaufmann, RN as Registered Nurse Holley Bouche, NP as Nurse Practitioner (Nurse Practitioner) Sylvan Cheese, NP as Nurse Practitioner (Hematology and Oncology)  DIAGNOSIS: Breast cancer of upper-outer quadrant of left female breast [facility])   Staging form: Breast, AJCC 7th Edition     Clinical stage from 02/13/2014: Stage IIB (T3, N0, M0) - Unsigned       Staging comments: Staged at breast conference on 1.20.16      Pathologic stage from 03/11/2014: Stage IIB (T2, N1a, cM0) - Signed by Seward Grater, MD on 03/19/2014       Staging comments: Staged on final mastectomy specimen by Dr. Avis Epley  SUMMARY OF ONCOLOGIC HISTORY:   Breast cancer of upper-outer quadrant of left female breast ([place])   01/30/2014 Mammogram Left breast: 2.4 cm irregular high density mass with indistinct margin, middle depth, 6.8 cm from nipple, with architectural distortion. There is also a 3.5 cm irregular high density asymmetry at 1:00, anterior depth, with arch distortion and thickening.   01/30/2014 Breast US Left breast: 3.7 cm irregular mass with indistinct margin at 2:00, posterior deth, 4 cm from nipple, hypoechoic. 10 x 1 cm irregular mass with lobulated and indistinct margin at 3:00, posterior depth. A third 0.9 cm lobulated mass at 1:00, posterior depth   02/04/2014 Initial Biopsy Left breast core needle bx x 3: Invasive ductal carcinoma with DCIS ER 100%, PR 100%, HER-2 negative, Ki-67 ranged from 19-26%; third biopsy showed invasive mammary cancer with lobular features and intraductal papilloma, ER/PR positive HER-2 negative   02/11/2014 Breast MRI Left  breast: 3 masses that are confluent measuring 5.6 x 2.7 x 3 cm extending to the left aspect of the areola, appearing to involve the skin of theareola, deviating the nipple towards the left.  No axillary findings.  Neg rightt breast.   02/13/2014 Clinical Stage Stage IIB: T3 N0   03/07/2014 Definitive Surgery Left mastectomy/SLNB: invasive ductal carcinoma grade 1; 4.8 cm with low-grade DCIS; margins negative 1/4 lymph nodes positive, ER 100% PR 100% HER-2 negative ratio 1.18 Ki-67 26% and 19%    03/07/2014 Pathologic Stage Stage IIB (T2 N1)   04/03/2014 - 08/13/2014 Chemotherapy Adjuvant chemotherapy with dose dense adriamycin and cytoxan followed by weekly abraxane 12   09/09/2014 - 10/17/2014 Radiation Therapy Adjuvant RT [name]): Left chest wall 50.4 Gy over 28 fractions. Left supraclavicular fossa and axilla: 45 Gy over 25 fractions   10/27/2014 -  Anti-estrogen oral therapy Anastrozole 1 mg daily plus Ibrance ([name] Clinical Trial)   01/16/2015 Survivorship Survivorship visit completed and copy of care plan provided to patient.    CHIEF COMPLIANT: complains of sore throat and yellowish expectoration  INTERVAL HISTORY: Theresa Huff is a 65 year old with above-mentioned history of left breast cancer currently on antiestrogen therapy with anastrozole with Ibrance. She is here for follow-up and complaints of sore throat that started 2 days ago. It is associated with yellowish sputum. Denies any fevers or chills. She does not feel too well today.  REVIEW OF SYSTEMS:   Constitutional: Denies fevers, chills or abnormal weight loss Eyes: Denies blurriness of vision Ears, nose, mouth, throat, and face: sore throat with bronchitis symptoms Respiratory: Denies cough,  dyspnea or wheezes Cardiovascular: Denies palpitation, chest discomfort Gastrointestinal:  Denies nausea, heartburn or change in bowel habits Skin: Denies abnormal skin rashes Lymphatics: Denies new lymphadenopathy or easy  bruising Neurological:Denies numbness, tingling or new weaknesses Behavioral/Psych: Mood is stable, no new changes  Extremities: No lower extremity edema Breast:  denies any pain or lumps or nodules in either breasts All other systems were reviewed with the patient and are negative.  I have reviewed the past medical history, past surgical history, social history and family history with the patient and they are unchanged from previous note.  ALLERGIES:  is allergic to hydrocodone; codeine; iodine; primidone; radiaplexrx; sulfa antibiotics; and tape.  MEDICATIONS:  Current Outpatient Prescriptions  Medication Sig Dispense Refill  . amoxicillin-clavulanate (AUGMENTIN) 875-125 MG tablet Take 1 tablet by mouth 2 (two) times daily. 14 tablet 0  . anastrozole (ARIMIDEX) 1 MG tablet Take 1 tablet (1 mg total) by mouth daily. 90 tablet 3  . calcium carbonate (OS-CAL) 600 MG TABS tablet Take 600 mg by mouth daily.    . cetirizine (ZYRTEC) 10 MG tablet Take 10 mg by mouth daily.      . Cholecalciferol (VITAMIN D PO) Take 2,000 Units by mouth daily.    . DULoxetine (CYMBALTA) 30 MG capsule Take 30 mg by mouth daily.     . Eszopiclone (ESZOPICLONE) 3 MG TABS Take 3 mg by mouth at bedtime. Take immediately before bedtime    . Investigational palbociclib (IBRANCE) 100 MG capsule [facility] AFT-05 [name] Take 1 capsule (100 mg total) by mouth daily. Take with food. Swallow whole. Do not chew. Take on days 1-21. Repeat every 28 days. 42 capsule 0  . omeprazole (PRILOSEC) 40 MG capsule Take 40 mg by mouth daily.  5  . SUMAtriptan (IMITREX) 50 MG tablet TAKE 1 TABLET BY MOUTH EVERY 2 HOURS AS NEEDED FOR MIGRAINES. MAY REPEAT IN 2 HOURS IF RECURS 9 tablet 0  . SUMAtriptan (IMITREX) 50 MG tablet Take 1 tablet (50 mg total) by mouth every 2 (two) hours as needed for migraine. May repeat in 2 hours if headache persists or recurs. 10 tablet 0  . Vitamin D, Ergocalciferol, (DRISDOL) 50000 UNITS CAPS capsule  Take 50,000 Units by mouth every 7 (seven) days.     No current facility-administered medications for this visit.    PHYSICAL EXAMINATION: ECOG PERFORMANCE STATUS: 1 - Symptomatic but completely ambulatory  Filed Vitals:   05/12/15 0830  BP: 124/67  Pulse: 74  Temp: 97.8 F (36.6 C)  Resp: 18   Filed Weights   05/12/15 0830  Weight: 157 lb (71.215 kg)    GENERAL:alert, no distress and comfortable SKIN: skin color, texture, turgor are normal, no rashes or significant lesions EYES: normal, Conjunctiva are pink and non-injected, sclera clear OROPHARYNX:redness and the oropharynx posterior pharyngeal wall  NECK: supple, thyroid normal size, non-tender, without nodularity LYMPH:  no palpable lymphadenopathy in the cervical, axillary or inguinal LUNGS: clear to auscultation and percussion with normal breathing effort HEART: regular rate & rhythm and no murmurs and no lower extremity edema ABDOMEN:abdomen soft, non-tender and normal bowel sounds MUSCULOSKELETAL:no cyanosis of digits and no clubbing  NEURO: alert & oriented x 3 with fluent speech, no focal motor/sensory deficits EXTREMITIES: No lower extremity edema  LABORATORY DATA:  I have reviewed the data as listed   Chemistry      Component Value Date/Time   NA 142 03/18/2015 0810   NA 139 05/03/2014 1946   K 4.5 03/18/2015 0810  K 4.1 05/03/2014 1946   CL 103 05/03/2014 1946   CO2 25 03/18/2015 0810   CO2 26 05/03/2014 1946   BUN 13.3 03/18/2015 0810   BUN 17 05/03/2014 1946   CREATININE 0.8 03/18/2015 0810   CREATININE 0.77 05/03/2014 1946      Component Value Date/Time   CALCIUM 9.4 03/18/2015 0810   CALCIUM 9.6 05/03/2014 1946   ALKPHOS 74 03/18/2015 0810   ALKPHOS 76 09/14/2010 1708   AST 27 03/18/2015 0810   AST 18 09/14/2010 1708   ALT 34 03/18/2015 0810   ALT 21 09/14/2010 1708   BILITOT 0.62 03/18/2015 0810   BILITOT 0.4 09/14/2010 1708       Lab Results  Component Value Date   WBC 3.1*  05/12/2015   HGB 11.4* 05/12/2015   HCT 33.7* 05/12/2015   MCV 101.8* 05/12/2015   PLT 152 05/12/2015   NEUTROABS 1.8 05/12/2015     ASSESSMENT & PLAN:  Breast cancer of upper-outer quadrant of left female breast Left breast invasive ductal carcinoma grade 1; 4.8 cm with low-grade DCIS; margins negative 1/4 lymph nodes positive, ER 100% PR 100% HER-2 negative ratio 1.18 Ki-67 26% and 19% T2 N1 stage IIB status post adjuvant chemotherapy with dose dense Adriamycin and Cytoxan 4 followed by Abraxane weekly 12 Completed Adjuvant radiation therapy 10/17/14  Current Treatment: [name] study: Randomized to Ibrance + Anastrozole Started 10/27/2014; Today starts cycle 6  Toxicities: Neutropenia related to Ibrance: Held treatment 12/23/2014 and subsequently resumed treatment with recovery of blood counts. Ibrance held again for neutropenia and resumed at 100 mg daily from cycle 4.  Headaches and dizziness:Much improved  Denies nausea vomiting diarrhea, constipation. Denies myalgias or arthralgias or stiffness  Acute bronchitis: Redness with sore throat. Denies any fevers. Coughing yellowish expectoration. We'll start the patient on Augmentin. Patient will use over-the-counter cough syrup.  Plan:continue Ibrance at 100 mg.  RTC in 3 months for labs and follow-up.   No orders of the defined types were placed in this encounter.   The patient has a good understanding of the overall plan. she agrees with it. she will call with any problems that may develop before the next visit here.   Rulon Eisenmenger, MD 05/12/2015

## 2015-05-12 NOTE — Progress Notes (Signed)
[name]: Cycle 6, day 1 ( cycle 7 and 8) Patient here today for start of cycle 6, 7 and 8 and with labs prior to her appointment with Dr. Lindi Adie. I met with patient prior to her lab appointment this morning and had given PRO's for her to complete. V/S completed. Patient returned cycle 4 and 5 study dispensed bottles, both empty (bottles given to PharmD Henreitta Leber), and denies any difficulty with self administration. Patient also returned medication diaries for both cycle 4 and 5. Patient reports nasal drainage, sore throat and cough, with yellowish discharge, started two days ago. Per Dr. Lindi Adie patient with acute bronchitis, he prescribed Augmentin for treatment. Patient reports no new concomitant medications. Patient denies other new symptoms, denies fever, denies any pain or nausea/vomitting. Patient reports appetite and energy as fair. Patient denies myalgias, arthralgia or stiffness . Per Dr. Geralyn Flash assessment of patient, labs and NCS labs, patient cleared for start of cycle 6. Patient was provided with study dispensed palbociclib for cycle 6, 7 and 8, reviewed and provided by Pharm D. Henreitta Leber. As well as, medication diaries for each cycle. Patient knows to start palbociclib tomorrow. All patient's questions answered to her satisfaction and patient was encouraged to contact Dr. Lindi Adie or myself with any questions or concerns she may have.  Research labs drawn. PRO's completed Adele Dan, RN, BSN Clinical Research 05/12/2015 10:01 AM

## 2015-05-12 NOTE — Telephone Encounter (Signed)
appt made and avs printed °

## 2015-05-13 LAB — HEMOGLOBIN A1C
Est. average glucose Bld gHb Est-mCnc: 105 mg/dL
HEMOGLOBIN A1C: 5.3 % (ref 4.8–5.6)

## 2015-07-09 IMAGING — CR DG CHEST 1V PORT
1 series · 1 of 1 positions shown · non-contrast
Comparison: 06/06/2010

CLINICAL DATA: Post Port-A-Cath placement.

EXAM:
PORTABLE CHEST - 1 VIEW

[AP]
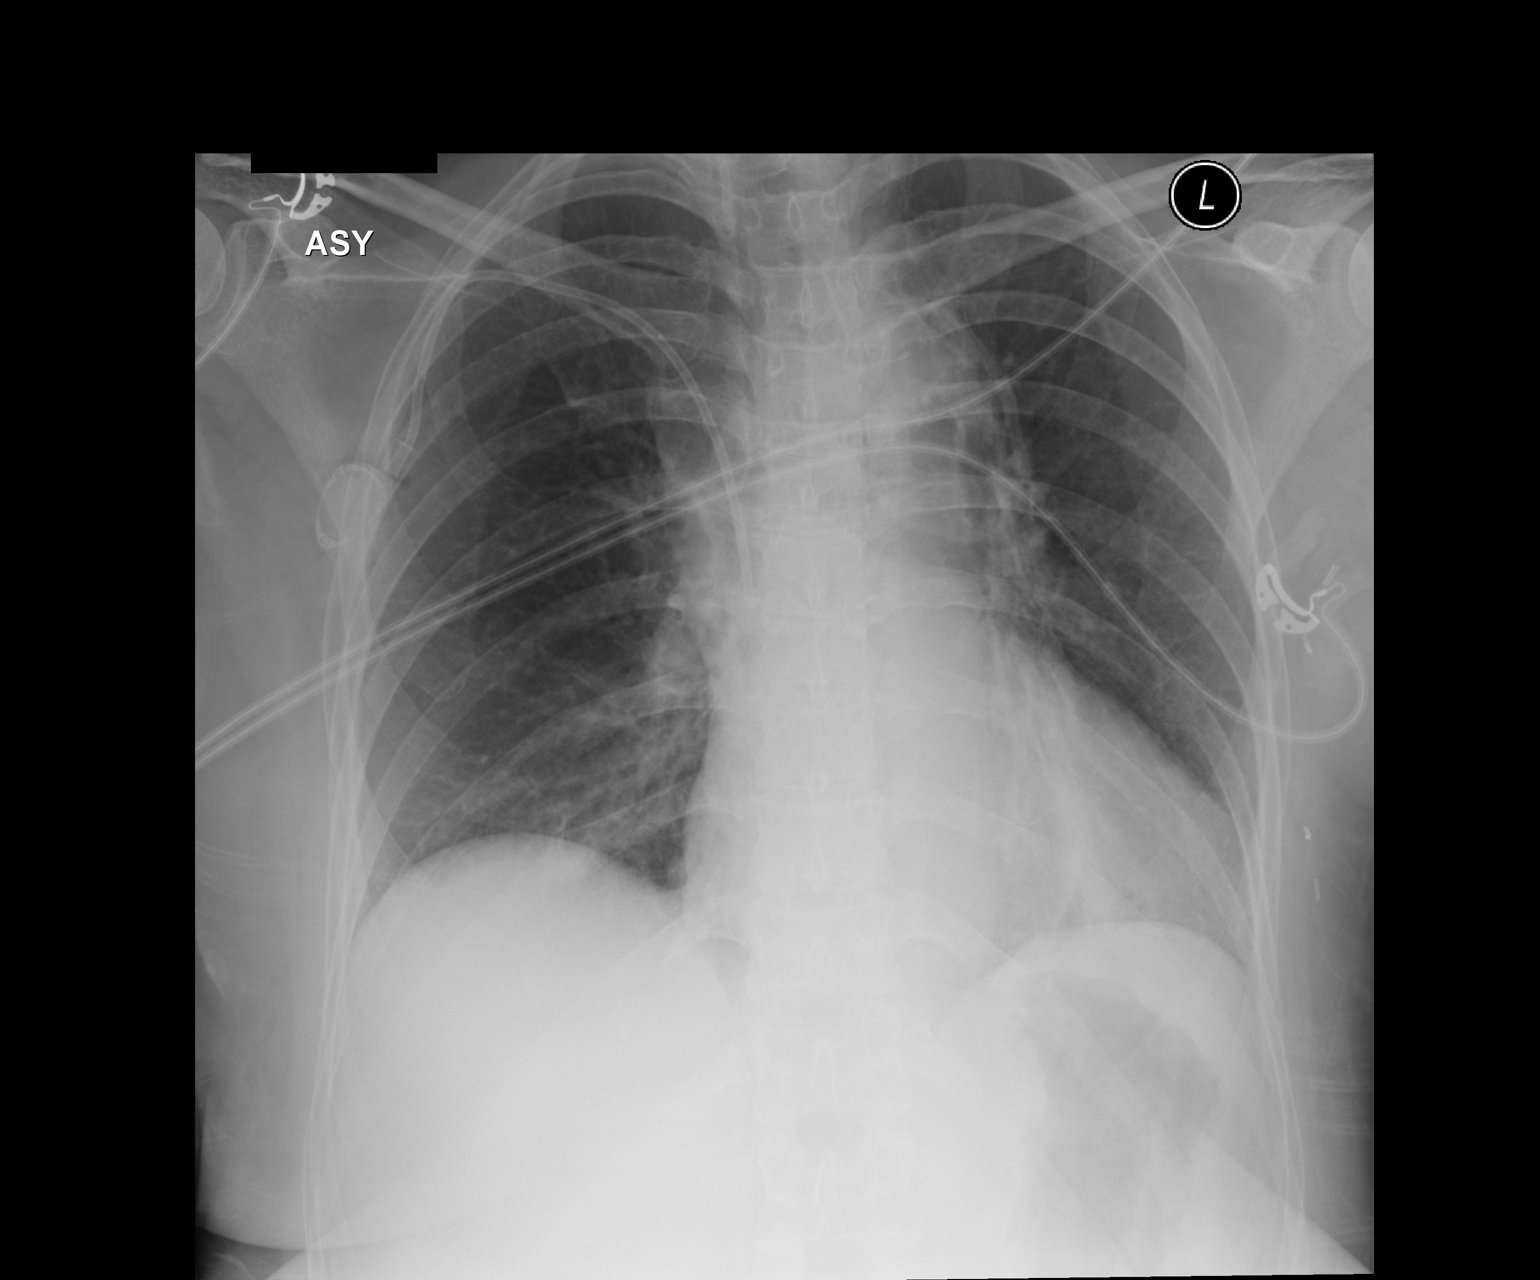

[1 of 1 positions shown; findings below may reference images not displayed]

FINDINGS: Right Port-A-Cath is in place with the tip in the SVC. No
pneumothorax.

There is bibasilar atelectasis. No effusions. Heart is borderline in
size.
IMPRESSION: Right Port-A-Cath placement with the tip in the SVC. No
pneumothorax.

Bibasilar atelectasis.

## 2015-07-12 ENCOUNTER — Other Ambulatory Visit: Payer: Self-pay | Admitting: Family Medicine

## 2015-07-12 DIAGNOSIS — M5416 Radiculopathy, lumbar region: Secondary | ICD-10-CM

## 2015-07-15 ENCOUNTER — Emergency Department (HOSPITAL_COMMUNITY): Payer: 59

## 2015-07-15 ENCOUNTER — Encounter (HOSPITAL_BASED_OUTPATIENT_CLINIC_OR_DEPARTMENT_OTHER): Payer: Self-pay | Admitting: Emergency Medicine

## 2015-07-15 ENCOUNTER — Emergency Department (HOSPITAL_BASED_OUTPATIENT_CLINIC_OR_DEPARTMENT_OTHER)
Admission: EM | Admit: 2015-07-15 | Discharge: 2015-07-15 | Disposition: A | Discharge status: Home / Self Care | Payer: 59 | Attending: Emergency Medicine | Admitting: Emergency Medicine

## 2015-07-15 DIAGNOSIS — R51 Headache: Secondary | ICD-10-CM | POA: Insufficient documentation

## 2015-07-15 DIAGNOSIS — Z79899 Other long term (current) drug therapy: Secondary | ICD-10-CM | POA: Diagnosis not present

## 2015-07-15 DIAGNOSIS — R296 Repeated falls: Secondary | ICD-10-CM | POA: Diagnosis not present

## 2015-07-15 DIAGNOSIS — Z853 Personal history of malignant neoplasm of breast: Secondary | ICD-10-CM | POA: Diagnosis not present

## 2015-07-15 DIAGNOSIS — R531 Weakness: Secondary | ICD-10-CM

## 2015-07-15 DIAGNOSIS — M5116 Intervertebral disc disorders with radiculopathy, lumbar region: Secondary | ICD-10-CM | POA: Diagnosis not present

## 2015-07-15 DIAGNOSIS — Z87891 Personal history of nicotine dependence: Secondary | ICD-10-CM | POA: Diagnosis not present

## 2015-07-15 DIAGNOSIS — R29898 Other symptoms and signs involving the musculoskeletal system: Secondary | ICD-10-CM

## 2015-07-15 LAB — CBC WITH DIFFERENTIAL/PLATELET
Basophils Absolute: 0.1 10*3/uL (ref 0.0–0.1)
Basophils Relative: 1 %
Eosinophils Absolute: 0.1 10*3/uL (ref 0.0–0.7)
Eosinophils Relative: 1 %
HEMATOCRIT: 36.6 % (ref 36.0–46.0)
HEMOGLOBIN: 12.5 g/dL (ref 12.0–15.0)
LYMPHS ABS: 1.4 10*3/uL (ref 0.7–4.0)
LYMPHS PCT: 33 %
MCH: 35.8 pg — AB (ref 26.0–34.0)
MCHC: 34.2 g/dL (ref 30.0–36.0)
MCV: 104.9 fL — AB (ref 78.0–100.0)
MONOS PCT: 10 %
Monocytes Absolute: 0.4 10*3/uL (ref 0.1–1.0)
NEUTROS ABS: 2.3 10*3/uL (ref 1.7–7.7)
NEUTROS PCT: 55 %
Platelets: 256 10*3/uL (ref 150–400)
RBC: 3.49 MIL/uL — AB (ref 3.87–5.11)
RDW: 13.8 % (ref 11.5–15.5)
WBC: 4.3 10*3/uL (ref 4.0–10.5)

## 2015-07-15 LAB — COMPREHENSIVE METABOLIC PANEL
ALT: 13 U/L — AB (ref 14–54)
ANION GAP: 8 (ref 5–15)
AST: 15 U/L (ref 15–41)
Albumin: 4.3 g/dL (ref 3.5–5.0)
Alkaline Phosphatase: 57 U/L (ref 38–126)
BUN: 12 mg/dL (ref 6–20)
CHLORIDE: 101 mmol/L (ref 101–111)
CO2: 29 mmol/L (ref 22–32)
CREATININE: 0.96 mg/dL (ref 0.44–1.00)
Calcium: 9.1 mg/dL (ref 8.9–10.3)
GFR calc non Af Amer: >60 mL/min (ref >60)
Glucose, Bld: 98 mg/dL (ref 65–99)
POTASSIUM: 4.3 mmol/L (ref 3.5–5.1)
SODIUM: 138 mmol/L (ref 135–145)
Total Bilirubin: 0.6 mg/dL (ref 0.3–1.2)
Total Protein: 6.8 g/dL (ref 6.5–8.1)

## 2015-07-15 LAB — URINALYSIS, ROUTINE W REFLEX MICROSCOPIC
BILIRUBIN URINE: NEGATIVE
Glucose, UA: NEGATIVE mg/dL
HGB URINE DIPSTICK: NEGATIVE
Ketones, ur: NEGATIVE mg/dL
Leukocytes, UA: NEGATIVE
Nitrite: NEGATIVE
PH: 7.5 (ref 5.0–8.0)
Protein, ur: NEGATIVE mg/dL
SPECIFIC GRAVITY, URINE: 1.004 — AB (ref 1.005–1.030)

## 2015-07-15 NOTE — Discharge Instructions (Signed)
Herniated Disk  A herniated disk occurs when a disk in your spine bulges out too far. This condition is also called a ruptured disk or slipped disk. Your spine (backbone) is made up of bones called vertebrae. Between each pair of vertebrae is an oval disk with a soft, spongy center that acts as a shock absorber when you move. The spongy center is surrounded by a tough outer ring.  When you have a herniated disk, the spongy center of the disk bulges out or ruptures through the outer ring. A herniated disk can press on a nerve between your vertebrae and cause pain. A herniated disk can occur anywhere in your back or neck area, but the lower back is the most common spot.  CAUSES   In many cases, a herniated disk occurs just from getting older. As you age, the spongy insides of your disks tend to shrink and dry out. A herniated disk can result from gradual wear and tear. Injury or sudden strain can also cause a herniated disk.   RISK FACTORS  Aging is the main risk factor for a herniated disk. Other risk factors include:   Being a man between the ages of 30 and 50 years.   Having a job that requires heavy lifting, bending, or twisting.   Having a job that requires long hours of driving.   Not getting enough exercise.   Being overweight.   Smoking.  SIGNS AND SYMPTOMS   Signs and symptoms depend on which disk is herniated.   For a herniated disk in the lower back, you may have sharp pain in:    One part of your leg, hip, or buttocks.    The back of your calf.    The top or sole of your foot (sciatica).    For a herniated disk in the neck, you may feel pain:    When you move your neck.    Near or over your shoulder blade.    That moves to your upper arm, forearm, or fingers.    You may also have muscle weakness. It may be hard to:    Lift your leg or arm.    Stand on your toes.    Squeeze tightly with one of your hands.   Other symptoms can include:    Numbness or tingling in the affected areas of your body.     Loss of bladder or bowel control. This is a rare but serious sign of a severe herniated disk in the lower back.  DIAGNOSIS   Your health care provider will do a physical exam. During this exam, you may have to move certain body parts or assume various positions. For example, your health care provider may do the straight-leg test. This is a good way to test for a herniated disk in your lower back. In this test, the health care provider lifts your leg while you lie on your back. This is to see if you feel pain down your leg. Your health care provider will also check for numbness or loss of feeling.   Your health care provider will also check your:   Reflexes.   Muscle strength.   Posture.   Other tests may be done to help in making a diagnosis. These may include:   An X-ray of the spine to rule out other causes of back pain.    Other imaging studies, such as an MRI or CT scan. This is to check whether the herniated disk is   pressing on your spinal canal.   Electromyography (EMG). This test checks the nerves that control muscles. It is sometimes used to identify the specific area of nerve involvement.   TREATMENT   In many cases, herniated disk symptoms go away over a period of days or weeks. You will most likely be free of symptoms in 3-4 months. Treatment may include the following:   The initial treatment for a herniated disk is ashort period of rest.    Bed rest is often limited to 1 or 2 days. Resting for too long delays recovery.    If you have a herniated disk in your lower back, you should avoid sitting as much as possible because sitting increases pressure on the disk.   Medicines. These may include:     Nonsteroidal anti-inflammatory drugs (NSAIDs).    Muscle relaxants for back spasms.    Narcotic pain medicine if your pain is very bad.    Steroid injections. You may need these along the involved nerve root to help control pain. The steroid is injected in the area of the herniated disk. It  helps by reducing swelling around the disk.   Physical therapy. This may include exercises to strengthen the muscles that help support your spine.    You may need surgery if other treatments do not work.   HOME CARE INSTRUCTIONS  Follow all your health care provider's instructions. These may include:   Take all medicines as directed by your health care provider.   Rest for 2 days and then start moving.   Do not sit or stand for long periods of time.   Maintain good posture when sitting and standing.   Avoid movements that cause pain, such as bending or lifting.   When you are able to start lifting things again:   Bend with your knees.   Keep your back straight.   Hold heavy objects close to your body.   If you are overweight, ask your health care provider to help you start a weight-loss program.   When you are able to start exercising, ask your health care provider how much and what type of exercise is best for you.   Work with a physical therapist on stretching and strengthening exercises for your back.   Do not wear high-heeled shoes.   Do not sleep on your belly.   Do not smoke.   Keep all follow-up visits as directed by your health care provider.  SEEK MEDICAL CARE IF:   You have back or neck pain that is not getting better after 4 weeks.   You have very bad pain in your back or neck.   You develop numbness, tingling, or weakness along with pain.  SEEK IMMEDIATE MEDICAL CARE IF:    You have numbness, tingling, or weakness that makes you unable to use your arms or legs.   You lose control of your bladder or bowels.   You have dizziness or fainting.   You have shortness of breath.   MAKE SURE YOU:    Understand these instructions.   Will watch your condition.   Will get help right away if you are not doing well or get worse.     This information is not intended to replace advice given to you by your health care provider. Make sure you discuss any questions you have with your health  care provider.     Document Released: 01/09/2000 Document Revised: 02/01/2014 Document Reviewed: 12/15/2012  Elsevier Interactive Patient Education   2016 [employer]

## 2015-07-15 NOTE — ED Notes (Signed)
[facility], here for back and leg pain. Pt ambulatory, denies pain at this time. VSS.

## 2015-07-15 NOTE — ED Provider Notes (Signed)
38:73 PM 65 year old female presents in transfer from [place] for MRI of her brain and lumbar spine to evaluate for low back pain and left leg weakness. MRI completed which shows left central L3/4 disc extrusion transversing the left L4 nerve causing moderate neural foraminal narrowing. No evidence of cauda equina. MRI brain is unremarkable.  These findings have been reviewed with the patient who verbalizes understanding. There is no indication for further emergent workup or admission. I have discussed with the patient the need for her to follow-up with a neurosurgeon. Referral given. Patient states that she does not need any medications for management of her back pain as she has been prescribed medications by her primary care doctor. Return precautions discussed and provided. Patient agreeable to plan with no unaddressed concerns; discharged in satisfactory condition.   Results for orders placed or performed during the hospital encounter of 07/15/15  Comprehensive metabolic panel  Result Value Ref Range   Sodium 138 135 - 145 mmol/L   Potassium 4.3 3.5 - 5.1 mmol/L   Chloride 101 101 - 111 mmol/L   CO2 29 22 - 32 mmol/L   Glucose, Bld 98 65 - 99 mg/dL   BUN 12 6 - 20 mg/dL   Creatinine, Ser 0.96 0.44 - 1.00 mg/dL   Calcium 9.1 8.9 - 10.3 mg/dL   Total Protein 6.8 6.5 - 8.1 g/dL   Albumin 4.3 3.5 - 5.0 g/dL   AST 15 15 - 41 U/L   ALT 13 (L) 14 - 54 U/L   Alkaline Phosphatase 57 38 - 126 U/L   Total Bilirubin 0.6 0.3 - 1.2 mg/dL   GFR calc non Af Amer >60 >60 mL/min   GFR calc Af Amer >60 >60 mL/min   Anion gap 8 5 - 15  CBC with Differential  Result Value Ref Range   WBC 4.3 4.0 - 10.5 K/uL   RBC 3.49 (L) 3.87 - 5.11 MIL/uL   Hemoglobin 12.5 12.0 - 15.0 g/dL   HCT 36.6 36.0 - 46.0 %   MCV 104.9 (H) 78.0 - 100.0 fL   MCH 35.8 (H) 26.0 - 34.0 pg   MCHC 34.2 30.0 - 36.0 g/dL   RDW 13.8 11.5 - 15.5 %   Platelets 256 150 - 400 K/uL   Neutrophils Relative % 55 %   Neutro  Abs 2.3 1.7 - 7.7 K/uL   Lymphocytes Relative 33 %   Lymphs Abs 1.4 0.7 - 4.0 K/uL   Monocytes Relative 10 %   Monocytes Absolute 0.4 0.1 - 1.0 K/uL   Eosinophils Relative 1 %   Eosinophils Absolute 0.1 0.0 - 0.7 K/uL   Basophils Relative 1 %   Basophils Absolute 0.1 0.0 - 0.1 K/uL  Urinalysis, Routine w reflex microscopic  Result Value Ref Range   Color, Urine YELLOW YELLOW   APPearance CLEAR CLEAR   Specific Gravity, Urine 1.004 (L) 1.005 - 1.030   pH 7.5 5.0 - 8.0   Glucose, UA NEGATIVE NEGATIVE mg/dL   Hgb urine dipstick NEGATIVE NEGATIVE   Bilirubin Urine NEGATIVE NEGATIVE   Ketones, ur NEGATIVE NEGATIVE mg/dL   Protein, ur NEGATIVE NEGATIVE mg/dL   Nitrite NEGATIVE NEGATIVE   Leukocytes, UA NEGATIVE NEGATIVE   Mr Brain Wo Contrast (neuro Protocol)  07/15/2015  CLINICAL DATA:  One month severe low back pain, LEFT leg weakness. Recurrent falls. History of treated breast cancer. EXAM: MRI HEAD WITHOUT CONTRAST TECHNIQUE: Multiplanar, multiecho pulse sequences of the brain and surrounding structures were  obtained without intravenous contrast. COMPARISON:  CT HEAD May 03, 2014 FINDINGS: INTRACRANIAL CONTENTS: No reduced diffusion to suggest acute ischemia. No susceptibility artifact to suggest hemorrhage. The ventricles and sulci are normal for patient's age. No suspicious parenchymal signal, masses or mass effect. No abnormal extra-axial fluid collections. No extra-axial masses though, contrast enhanced sequences would be more sensitive. Normal major intracranial vascular flow voids present at skull base. ORBITS: The included ocular globes and orbital contents are non-suspicious. SINUSES: Paranasal sinus are well aerated.  RIGHT mastoid effusion. SKULL/SOFT TISSUES: No abnormal sellar expansion. No suspicious calvarial bone marrow signal. Craniocervical junction maintained. Grade 1 C3-4 anterolisthesis associated with spondylosis. IMPRESSION: Negative MRI brain. Electronically Signed   By:  Elon Alas M.D.   On: 07/15/2015 21:56   Mr Lumbar Spine Wo Contrast  07/15/2015  CLINICAL DATA:  Low back pain for 1 month radiating to LEFT hip, exacerbated by standing and walking. LEFT leg weakness resulting in multiple falls. EXAM: MRI LUMBAR SPINE WITHOUT CONTRAST TECHNIQUE: Multiplanar, multisequence MR imaging of the lumbar spine was performed. No intravenous contrast was administered. COMPARISON:  None. FINDINGS: OSSEOUS STRUCTURES: Lumbar vertebral bodies are intact and aligned with maintenance of lumbar lordosis. Using the reference level of the last well-formed intervertebral disc as L5-S1, severe L2-3 and L5-S1 disc height loss, moderate remaining lumbar disc height loss with decreased T2 signal within all disc compatible with desiccation. Associated moderate subacute on chronic discogenic endplate changes [id number], X33443 and L5-S1, mild at the remaining lumbar levels. Superimposed minimal acute discogenic endplate changes X33443 and L3-4. No STIR signal abnormality to suggest fracture. SPINAL CORD: Conus medullaris terminates at L1-2 and demonstrates normal morphology and signal characteristics. Cauda equina is normal. SOFT TISSUES: Included prevertebral and paraspinal soft tissues are normal. LEVEL BY LEVEL EVALUATION: T12-L1: No disc bulge, canal stenosis nor neural foraminal narrowing. L1-2: Annular bulging. No canal stenosis or neural foraminal narrowing. L2-3: Small broad-based disc bulge asymmetric to the RIGHT. Mild facet arthropathy and ligamentum flavum redundancy without canal stenosis. Mild RIGHT neural foraminal narrowing. L3-4: 7 x 4 mm (transverse by AP) LEFT central disc extrusion with 11 mm of contiguous superior migration. Effacement LEFT lateral recess, contacting the traversing LEFT L4 nerve. Mild facet arthropathy and ligamentum flavum redundancy without canal stenosis. Moderate LEFT neural foraminal narrowing due to extruded disc. L4-5: Small broad-based disc bulge. Severe facet  arthropathy and ligamentum flavum redundancy. Very mild canal stenosis. Mild RIGHT, minimal LEFT neural foraminal narrowing. L5-S1: Small broad-based disc bulge. Mild facet arthropathy without canal stenosis. Minimal LEFT neural foraminal narrowing. IMPRESSION: 7 x 4 x 11 mm LEFT central L3-4 disc extrusion contacts the traversing LEFT L4 nerve and results in moderate LEFT L3-4 neural foraminal narrowing. No acute fracture or malalignment.  Very mild canal stenosis L4-5. Electronically Signed   By: Elon Alas M.D.   On: 07/15/2015 22:07      Antonietta Breach, PA-C 07/15/15 [place], DO 07/15/15 [id number]

## 2015-07-15 NOTE — ED Provider Notes (Signed)
CSN: [id number]     Arrival date & time 07/15/15  1556 History   First MD Initiated Contact with Patient 07/15/15 1640     Chief Complaint  Patient presents with  . Weakness     (Consider location/radiation/quality/duration/timing/severity/associated sxs/prior Treatment) HPI Patient reports that for about a month now she's been getting problems with lower back pain. The pain radiates to her left hip and leg. It is exacerbated by standing or walking. She reports that over the past several days now she has developed weakness in the left lower leg. Ports that it has given out on her a number of times now. She reports it is hard to tell if both legs are weak or if it's the left leg gives and then she falls. She reports that this is happening most 5 times now in the past 24 hours. The patient reports that she is always able to use her upper extremities to grab onto a wall or something nearby so she does not fall and injured. She has not struck her head. No cervical pain. No chest pain or abdominal pain. No bowel or bladder dysfunction. No loss of continence. Patient reports just over the past several hours she has developed a frontal headache. Mild in nature. No associated visual dysfunction. No double vision or loss of vision. No nausea or vomiting. The patient is scheduled for an MRI tomorrow. She reports she contacted her family doctor however due to the increased frequency of falling and they advised her to come to the emergency department for a CT scan. Patient has a history of breast cancer that has been treated. Past Medical History  Diagnosis Date  . Cough   . Weight gain   . Hot flashes   . Insomnia, unspecified   . Depressive disorder, not elsewhere classified   . Allergic rhinitis due to pollen   . Plantar fasciitis   . Restless leg syndrome 09/09/2010  . Breast cancer ([place])   . Skin cancer   . GERD (gastroesophageal reflux disease)   . Wears glasses   . Anemia     with pregnancy  .  Radiation 09/09/14-10/17/14    Left chestwall/supraclav.  . Allergy    Past Surgical History  Procedure Laterality Date  . Tonsillectomy    . Melanoma excision      right leg, left arm, right arm (several from each area)  . Colonoscopy    . Mastectomy w/ sentinel node biopsy Left 03/07/2014    Procedure: LEFT TOTAL MASTECTOMY WITH LEFT SENTINEL LYMPH NODE BIOPSY;  Surgeon: Rolm Bookbinder, MD;  Location: [facility];  Service: General;  Laterality: Left;  . Portacath placement Right 04/01/2014    Procedure: INSERTION PORT-A-CATH;  Surgeon: Rolm Bookbinder, MD;  Location: [facility];  Service: General;  Laterality: Right;  . Port-a-cath removal N/A 12/04/2014    Procedure: REMOVAL PORT-A-CATH;  Surgeon: Rolm Bookbinder, MD;  Location: [facility];  Service: General;  Laterality: N/A;   Family History  Problem Relation Age of Onset  . Alcohol abuse Father   . Emphysema Mother   . Heart disease Mother   . Heart failure Mother   . Asthma Daughter   . Asthma Other     grandson  . Asthma Sister    Social History  Substance Use Topics  . Smoking status: Former Smoker -- 0.20 packs/day for 6 years    Types: Cigarettes    Quit date: 01/25/1974  . Smokeless tobacco: Never Used  .  Alcohol Use: 0.0 oz/week    0 Standard drinks or equivalent per week     Comment: 5 glasses per month   OB History    No data available     Review of Systems  10 Systems reviewed and are negative for acute change except as noted in the HPI.   Allergies  Hydrocodone; Codeine; Iodine; Primidone; Radiaplexrx; Sulfa antibiotics; and Tape  Home Medications   Prior to Admission medications   Medication Sig Start Date End Date Taking? Authorizing Provider  naproxen sodium (ANAPROX) 550 MG tablet Take 550 mg by mouth 2 (two) times daily with a meal.   Yes Historical Provider, MD  tiZANidine (ZANAFLEX) 4 MG capsule Take 4 mg by mouth 3 (three) times daily.   Yes Historical Provider,  MD  traMADol (ULTRAM) 50 MG tablet Take by mouth every 6 (six) hours as needed.   Yes Historical Provider, MD  amoxicillin-clavulanate (AUGMENTIN) 875-125 MG tablet Take 1 tablet by mouth 2 (two) times daily. 05/12/15   Nicholas Lose, MD  anastrozole (ARIMIDEX) 1 MG tablet Take 1 tablet (1 mg total) by mouth daily. 10/08/14   Nicholas Lose, MD  calcium carbonate (OS-CAL) 600 MG TABS tablet Take 600 mg by mouth daily.    Historical Provider, MD  cetirizine (ZYRTEC) 10 MG tablet Take 10 mg by mouth daily.      Historical Provider, MD  Cholecalciferol (VITAMIN D PO) Take 2,000 Units by mouth daily.    Historical Provider, MD  DULoxetine (CYMBALTA) 30 MG capsule Take 30 mg by mouth daily.  03/09/14   Historical Provider, MD  Eszopiclone (ESZOPICLONE) 3 MG TABS Take 3 mg by mouth at bedtime. Take immediately before bedtime    Historical Provider, MD  Investigational palbociclib [name]) 100 MG capsule [facility] AFT-05 [name] Take 1 capsule (100 mg total) by mouth daily. Take with food. Swallow whole. Do not chew. Take on days 1-21. Repeat every 28 days. 05/12/15   Nicholas Lose, MD  omeprazole (PRILOSEC) 40 MG capsule Take 40 mg by mouth daily. 10/28/14   Historical Provider, MD  SUMAtriptan (IMITREX) 50 MG tablet TAKE 1 TABLET BY MOUTH EVERY 2 HOURS AS NEEDED FOR MIGRAINES. MAY REPEAT IN 2 HOURS IF RECURS 05/08/15   Nicholas Lose, MD  SUMAtriptan (IMITREX) 50 MG tablet Take 1 tablet (50 mg total) by mouth every 2 (two) hours as needed for migraine. May repeat in 2 hours if headache persists or recurs. 05/08/15   Nicholas Lose, MD  Vitamin D, Ergocalciferol, (DRISDOL) 50000 UNITS CAPS capsule Take 50,000 Units by mouth every 7 (seven) days.    Historical Provider, MD   BP 135/75 mmHg  Pulse 80  Temp(Src) 98.3 F (36.8 C) (Oral)  Resp 18  Ht 5\' 5"  (1.651 m)  Wt 158 lb (71.668 kg)  BMI 26.29 kg/m2  SpO2 100% Physical Exam  Constitutional: She is oriented to person, place, and time. She appears  well-developed and well-nourished. No distress.  Patient is well conditioned. She is alert. No distress. He is nontoxic.  HENT:  Head: Normocephalic and atraumatic.  Right Ear: External ear normal.  Left Ear: External ear normal.  Nose: Nose normal.  Mouth/Throat: Oropharynx is clear and moist.  Eyes: EOM are normal. Pupils are equal, round, and reactive to light.  Neck: Neck supple.  Cardiovascular: Normal rate, regular rhythm, normal heart sounds and intact distal pulses.   Pulmonary/Chest: Effort normal and breath sounds normal.  Left mastectomy.  Abdominal: Soft. Bowel sounds are normal.  She exhibits no distension. There is no tenderness.  Musculoskeletal: Normal range of motion. She exhibits no edema or tenderness.  Neurological: She is alert and oriented to person, place, and time. She has normal strength. No cranial nerve deficit. She exhibits abnormal muscle tone. Coordination normal. GCS eye subscore is 4. GCS verbal subscore is 5. GCS motor subscore is 6.  Cognitive function is normal. Speech is normal. Cranial nerve function normal. Upper extremity strength testing 5/5 for grip, flexion/extension, shoulder shrug against resistance. Sensation intact to light touch. Lower extremity: Left lower extremity patient can elevate and hold against resistance but slightly less strength against resistance than right. Patient can also flex and extend at the hip and push against resistance but again very slightly less so in strength compared to right. Patient endorses slight difference in light touch sensation to the lower leg. Right leg exam is normal for strength and sensation.  Skin: Skin is warm, dry and intact.  Psychiatric: She has a normal mood and affect.    ED Course  Procedures (including critical care time) Labs Review Labs Reviewed  CBC WITH DIFFERENTIAL/PLATELET - Abnormal; Notable for the following:    RBC 3.49 (*)    MCV 104.9 (*)    MCH 35.8 (*)    All other components  within normal limits  COMPREHENSIVE METABOLIC PANEL  URINALYSIS, ROUTINE W REFLEX MICROSCOPIC (NOT AT [facility])    Imaging Review No results found. I have personally reviewed and evaluated these images and lab results as part of my medical decision-making.   EKG Interpretation None     Consult: Dr. Jeanell Sparrow except patient for transfer to [facility] for MRI MDM   Final diagnoses:  Weakness  Weakness of left lower extremity  Frequent falls  History of breast cancer   Patient is referred to the emergency department for increased frequency of falls with back pain. The patient's symptoms are most suggestive of disc herniation with nerve compression. Patient will need MRI of lumbar sacral spine. MRI brain will also be obtained. Patient has slight weakness relative to right on examination. Patient does have history of breast cancer. Will obtain MRI to rule out any occult metastatic lesions of the brain. I doubt CVA is etiology. This closely aligns with a pain syndrome in the lower back. Patient does not have any other neurologic deficits. If MRI is negative or not emergent for surgical intervention, I feel the patient is safe for discharge and continued follow-up with her PCP and referrals as needed.    Charlesetta Shanks, MD 07/15/15 [phone]

## 2015-07-15 NOTE — ED Notes (Signed)
Patient states that about a month ago started to have lower back pain more to the left hip and left leg. The patient reports that she has gone to the chiropractor and PMD - the patient reports that nothing has helped. The patient reports that on Friday that she went back to MD and was ordered for an MRI - tomorrow. The patient reports that yesterday she fell down some stairs because she felt lightheaded. Patient reports that she also fell last night related to possible weakness to her bilateral legs, and felt like she collapsed. Patient reports that multiple times of this happening. Patient denies any dizziness right now but reports a Headache x 2 hours.

## 2015-07-16 ENCOUNTER — Encounter: Payer: Self-pay | Admitting: *Deleted

## 2015-07-16 ENCOUNTER — Inpatient Hospital Stay: Admission: RE | Admit: 2015-07-16 | Payer: 59 | Source: Ambulatory Visit

## 2015-07-16 DIAGNOSIS — C50412 Malignant neoplasm of upper-outer quadrant of left female breast: Secondary | ICD-10-CM

## 2015-07-16 NOTE — Progress Notes (Signed)
Research encounter created for purpose of documenting follow up of Theresa Huff's ED visit on the evening of 07/15/15 to determine whether this incident required expedited reporting for the AFT-05 [name] research study, which the patient is actively participating in and currently receiving the study drug palbociclib. The purpose for her visit to the ED was weakness in her left leg for several days now and lower back pain for about a month. She reported that for the past 24 hours, her left leg "gives out and then she falls." So far she has been able to catch herself and has not actually fallen down or injured herself, but states this has occurred about 5 times now. She had an MRI of the brain, which was negative, and an MRI of the Lumbar spine, which showed "left central L3/4 disc extrusion traversing the left L4 nerve causing moderate neural foraminal narrowing. No evidence of cauda equina." There was no indication for further ED work up or hospitalization noted,  and the patient was discharged and referral given to follow up with a neurosurgeon. [name] was not given a prescription for her back pain because she reported that she had been prescribed pain medication by her PCP. This incident does not meet the criteria described in the protocol section 11.6 regarding expedited adverse event reporting, and will therefore be reported as a routine adverse event.  Adverse Event Log  Study/Protocol: AFT-05 [name] Cycle: 8  Event Grade Onset Date Resolved Date Drug Name Attribution Treatment Comments  Back Pain Grade 2  05/2015 (reports about a month)  Palbociclib unrelated  Pain related to herniated disc  Muscle weakness lower limb Grade 2 06/14/15  Palbociclib  unrelated  Per MRI - herniated disc pressing on nerve?  Herniated Lumbar Disc Grade 2 Dx 07/15/15  Palbiciclib unrelated     Yolande Jolly, BSN, MHA, OCN 07/16/2015 11:35 AM   T/C made to Royston Cowper this evening to follow up her ED visit last  evening. Patient states "I'm OK. I have a herniated disc." Confirms that she is better and that she has an appointment with neurosurgery on 08/04/15 or possibly sooner. Also reports she has pain medication and a muscle relaxer, but states she cannot take strong medicine while she works, so she is also taking some Ibuprofen for her pain during the day. Informed that I am following up on her for her usual research nurse Rubin Payor, and that she can call me if she needs anything in Mira's absence. My name and contact information were provided to her. Yolande Jolly, BSN, MHA, OCN 07/16/2015 6:15 PM

## 2015-07-19 ENCOUNTER — Other Ambulatory Visit: Payer: 59

## 2015-07-31 ENCOUNTER — Other Ambulatory Visit: Payer: Self-pay | Admitting: Medical Oncology

## 2015-07-31 DIAGNOSIS — C50412 Malignant neoplasm of upper-outer quadrant of left female breast: Secondary | ICD-10-CM

## 2015-08-04 ENCOUNTER — Other Ambulatory Visit (HOSPITAL_BASED_OUTPATIENT_CLINIC_OR_DEPARTMENT_OTHER): Payer: 59

## 2015-08-04 ENCOUNTER — Encounter: Payer: Self-pay | Admitting: Hematology and Oncology

## 2015-08-04 ENCOUNTER — Encounter: Payer: Self-pay | Admitting: Medical Oncology

## 2015-08-04 ENCOUNTER — Telehealth: Payer: Self-pay | Admitting: Hematology and Oncology

## 2015-08-04 ENCOUNTER — Ambulatory Visit (HOSPITAL_BASED_OUTPATIENT_CLINIC_OR_DEPARTMENT_OTHER): Payer: 59 | Admitting: Hematology and Oncology

## 2015-08-04 VITALS — BP 129/80 | HR 78 | Temp 98.2°F | Resp 17 | Ht 65.0 in | Wt 154.3 lb

## 2015-08-04 DIAGNOSIS — C50412 Malignant neoplasm of upper-outer quadrant of left female breast: Secondary | ICD-10-CM | POA: Diagnosis not present

## 2015-08-04 DIAGNOSIS — Z006 Encounter for examination for normal comparison and control in clinical research program: Secondary | ICD-10-CM | POA: Diagnosis not present

## 2015-08-04 DIAGNOSIS — M4310 Spondylolisthesis, site unspecified: Secondary | ICD-10-CM | POA: Diagnosis not present

## 2015-08-04 DIAGNOSIS — D701 Agranulocytosis secondary to cancer chemotherapy: Secondary | ICD-10-CM

## 2015-08-04 LAB — COMPREHENSIVE METABOLIC PANEL
ALBUMIN: 3.9 g/dL (ref 3.5–5.0)
ALK PHOS: 54 U/L (ref 40–150)
ALT: 11 U/L (ref 0–55)
AST: 14 U/L (ref 5–34)
Anion Gap: 9 mEq/L (ref 3–11)
BUN: 14.7 mg/dL (ref 7.0–26.0)
CALCIUM: 9.5 mg/dL (ref 8.4–10.4)
CO2: 25 mEq/L (ref 22–29)
CREATININE: 0.8 mg/dL (ref 0.6–1.1)
Chloride: 109 mEq/L (ref 98–109)
EGFR: 79 mL/min/{1.73_m2} — ABNORMAL LOW (ref >90)
GLUCOSE: 91 mg/dL (ref 70–140)
POTASSIUM: 4.4 meq/L (ref 3.5–5.1)
SODIUM: 143 meq/L (ref 136–145)
TOTAL PROTEIN: 6.5 g/dL (ref 6.4–8.3)
Total Bilirubin: 1.06 mg/dL (ref 0.20–1.20)

## 2015-08-04 LAB — CBC WITH DIFFERENTIAL/PLATELET
BASO%: 1.8 % (ref 0.0–2.0)
BASOS ABS: 0 10*3/uL (ref 0.0–0.1)
EOS%: 2.2 % (ref 0.0–7.0)
Eosinophils Absolute: 0 10*3/uL (ref 0.0–0.5)
HEMATOCRIT: 33.9 % — AB (ref 34.8–46.6)
HEMOGLOBIN: 11.3 g/dL — AB (ref 11.6–15.9)
LYMPH%: 46.2 % (ref 14.0–49.7)
MCH: 36.3 pg — ABNORMAL HIGH (ref 25.1–34.0)
MCHC: 33.4 g/dL (ref 31.5–36.0)
MCV: 108.7 fL — ABNORMAL HIGH (ref 79.5–101.0)
MONO#: 0.3 10*3/uL (ref 0.1–0.9)
MONO%: 12.8 % (ref 0.0–14.0)
NEUT#: 0.8 10*3/uL — ABNORMAL LOW (ref 1.5–6.5)
NEUT%: 37 % — AB (ref 38.4–76.8)
Platelets: 167 10*3/uL (ref 145–400)
RBC: 3.12 10*6/uL — ABNORMAL LOW (ref 3.70–5.45)
RDW: 15.1 % — AB (ref 11.2–14.5)
WBC: 2.2 10*3/uL — ABNORMAL LOW (ref 3.9–10.3)
lymph#: 1 10*3/uL (ref 0.9–3.3)

## 2015-08-04 NOTE — Assessment & Plan Note (Signed)
Left breast invasive ductal carcinoma grade 1; 4.8 cm with low-grade DCIS; margins negative 1/4 lymph nodes positive, ER 100% PR 100% HER-2 negative ratio 1.18 Ki-67 26% and 19% T2 N1 stage IIB status post adjuvant chemotherapy with dose dense Adriamycin and Cytoxan 4 followed by Abraxane weekly 12 Completed Adjuvant radiation therapy 10/17/14  Current Treatment: [name] study: Randomized to Ibrance + Anastrozole Started 10/27/2014; Today starts cycle 9  Toxicities: Neutropenia related to Ibrance: Held treatment 12/23/2014 and subsequently resumed treatment with recovery of blood counts. Ibrance held again for neutropenia and resumed at 100 mg daily from cycle 4.  Headaches and dizziness:Much improved  Denies nausea vomiting diarrhea, constipation. Denies myalgias or arthralgias or stiffness  Plan:continue Ibrance at 100 mg.  RTC in 3 months for labs and follow-up.

## 2015-08-04 NOTE — Progress Notes (Signed)
Patient Care Team: Antony Contras, MD as PCP - General (Family Medicine) Elsie Stain, MD (Pulmonary Disease) Rolm Bookbinder, MD as Consulting Physician (General Surgery) Nicholas Lose, MD as Consulting Physician (Hematology and Oncology) [name], MD as Consulting Physician (Radiation Oncology) Rockwell Germany, RN as Registered Nurse Mauro Kaufmann, RN as Registered Nurse Holley Bouche, NP as Nurse Practitioner (Nurse Practitioner) Sylvan Cheese, NP as Nurse Practitioner (Hematology and Oncology)  DIAGNOSIS: Breast cancer of upper-outer quadrant of left female breast [facility])   Staging form: Breast, AJCC 7th Edition     Clinical stage from 02/13/2014: Stage IIB (T3, N0, M0) - Unsigned       Staging comments: Staged at breast conference on 1.20.16      Pathologic stage from 03/11/2014: Stage IIB (T2, N1a, cM0) - Signed by Seward Grater, MD on 03/19/2014       Staging comments: Staged on final mastectomy specimen by Dr. Avis Epley    SUMMARY OF ONCOLOGIC HISTORY:   Breast cancer of upper-outer quadrant of left female breast ([place])   01/30/2014 Mammogram Left breast: 2.4 cm irregular high density mass with indistinct margin, middle depth, 6.8 cm from nipple, with architectural distortion. There is also a 3.5 cm irregular high density asymmetry at 1:00, anterior depth, with arch distortion and thickening.   01/30/2014 Breast US Left breast: 3.7 cm irregular mass with indistinct margin at 2:00, posterior deth, 4 cm from nipple, hypoechoic. 10 x 1 cm irregular mass with lobulated and indistinct margin at 3:00, posterior depth. A third 0.9 cm lobulated mass at 1:00, posterior depth   02/04/2014 Initial Biopsy Left breast core needle bx x 3: Invasive ductal carcinoma with DCIS ER 100%, PR 100%, HER-2 negative, Ki-67 ranged from 19-26%; third biopsy showed invasive mammary cancer with lobular features and intraductal papilloma, ER/PR positive HER-2 negative   02/11/2014 Breast MRI  Left breast: 3 masses that are confluent measuring 5.6 x 2.7 x 3 cm extending to the left aspect of the areola, appearing to involve the skin of theareola, deviating the nipple towards the left.  No axillary findings.  Neg rightt breast.   02/13/2014 Clinical Stage Stage IIB: T3 N0   03/07/2014 Definitive Surgery Left mastectomy/SLNB: invasive ductal carcinoma grade 1; 4.8 cm with low-grade DCIS; margins negative 1/4 lymph nodes positive, ER 100% PR 100% HER-2 negative ratio 1.18 Ki-67 26% and 19%    03/07/2014 Pathologic Stage Stage IIB (T2 N1)   04/03/2014 - 08/13/2014 Chemotherapy Adjuvant chemotherapy with dose dense adriamycin and cytoxan followed by weekly abraxane 12   09/09/2014 - 10/17/2014 Radiation Therapy Adjuvant RT [name]): Left chest wall 50.4 Gy over 28 fractions. Left supraclavicular fossa and axilla: 45 Gy over 25 fractions   10/27/2014 -  Anti-estrogen oral therapy Anastrozole 1 mg daily plus Ibrance ([name] Clinical Trial)   01/16/2015 Survivorship Survivorship visit completed and copy of care plan provided to patient.    CHIEF COMPLIANT: Follow-up on anastrozole with Ibrance  INTERVAL HISTORY: Theresa Huff is a 65 year old with above-mentioned history of left breast cancer currently on adjuvant antiestrogen therapy. She is currently on anastrozole with Ibrance on clinical trial. Recently she had a slipped disc and was in the emergency room where she underwent an MRI that confirmed a slipped disc. Because of this she had fallen a few times because her legs feel weak. She is also noticed some tingling of her feet as a result of this. She is seeing neurosurgery today. Her back pain which  started this whole process is somewhat better on Naprosyn. Previously with Ultram she felt very drowsy. She is not taking any other muscle relaxants. She is being very cautious when she goes up and down the stairs at home. She has not fallen since the falls that she experienced a few weeks  ago. Previously she had a bout of upper respiratory infection with fluid behind the right ear that was drained by ENT and she was on antibiotics for 3 weeks in April 2017.  REVIEW OF SYSTEMS:   Constitutional: Denies fevers, chills or abnormal weight loss Eyes: Denies blurriness of vision Ears, nose, mouth, throat, and face: Denies mucositis or sore throat Respiratory: Denies cough, dyspnea or wheezes Cardiovascular: Denies palpitation, chest discomfort Gastrointestinal:  Denies nausea, heartburn or change in bowel habits Skin: Denies abnormal skin rashes Lymphatics: Denies new lymphadenopathy or easy bruising Neurological: Lower extremity weakness and new onset of numbness in the feet possibly related to slipped disc Behavioral/Psych: Mood is stable, no new changes  Extremities: No lower extremity edema All other systems were reviewed with the patient and are negative.  I have reviewed the past medical history, past surgical history, social history and family history with the patient and they are unchanged from previous note.  ALLERGIES:  is allergic to hydrocodone; codeine; iodine; primidone; radiaplexrx; sulfa antibiotics; and tape.  MEDICATIONS:  Current Outpatient Prescriptions  Medication Sig Dispense Refill  . anastrozole (ARIMIDEX) 1 MG tablet Take 1 tablet (1 mg total) by mouth daily. 90 tablet 3  . calcium carbonate (OS-CAL) 600 MG TABS tablet Take 600 mg by mouth daily.    . cetirizine (ZYRTEC) 10 MG tablet Take 10 mg by mouth daily.      . Cholecalciferol (VITAMIN D PO) Take 2,000 Units by mouth daily.    . DULoxetine (CYMBALTA) 30 MG capsule Take 30 mg by mouth daily.     . Eszopiclone (ESZOPICLONE) 3 MG TABS Take 3 mg by mouth at bedtime. Take immediately before bedtime    . Investigational palbociclib (IBRANCE) 100 MG capsule [facility] AFT-05 [name] Take 1 capsule (100 mg total) by mouth daily. Take with food. Swallow whole. Do not chew. Take on days 1-21.  Repeat every 28 days. 69 capsule 0  . naproxen sodium (ANAPROX) 550 MG tablet Take 550 mg by mouth 2 (two) times daily with a meal.    . omeprazole (PRILOSEC) 40 MG capsule Take 40 mg by mouth daily.  5  . SUMAtriptan (IMITREX) 50 MG tablet Take 1 tablet (50 mg total) by mouth every 2 (two) hours as needed for migraine. May repeat in 2 hours if headache persists or recurs. 10 tablet 0  . Vitamin D, Ergocalciferol, (DRISDOL) 50000 UNITS CAPS capsule Take 50,000 Units by mouth every 7 (seven) days.     No current facility-administered medications for this visit.    PHYSICAL EXAMINATION: ECOG PERFORMANCE STATUS: 1 - Symptomatic but completely ambulatory  Filed Vitals:   08/04/15 0833  BP: 129/80  Pulse: 78  Temp: 98.2 F (36.8 C)  Resp: 17   Filed Weights   08/04/15 0833  Weight: 154 lb 4.8 oz (69.99 kg)    GENERAL:alert, no distress and comfortable SKIN: skin color, texture, turgor are normal, no rashes or significant lesions EYES: normal, Conjunctiva are pink and non-injected, sclera clear OROPHARYNX:no exudate, no erythema and lips, buccal mucosa, and tongue normal  NECK: supple, thyroid normal size, non-tender, without nodularity LYMPH:  no palpable lymphadenopathy in the cervical, axillary or inguinal  LUNGS: clear to auscultation and percussion with normal breathing effort HEART: regular rate & rhythm and no murmurs and no lower extremity edema ABDOMEN:abdomen soft, non-tender and normal bowel sounds MUSCULOSKELETAL:no cyanosis of digits and no clubbing  NEURO: alert & oriented x 3 with fluent speech, no focal motor/sensory deficits EXTREMITIES: No lower extremity edema  LABORATORY DATA:  I have reviewed the data as listed   Chemistry      Component Value Date/Time   NA 143 08/04/2015 0821   NA 138 07/15/2015 1745   K 4.4 08/04/2015 0821   K 4.3 07/15/2015 1745   CL 101 07/15/2015 1745   CO2 25 08/04/2015 0821   CO2 29 07/15/2015 1745   BUN 14.7 08/04/2015 0821    BUN 12 07/15/2015 1745   CREATININE 0.8 08/04/2015 0821   CREATININE 0.96 07/15/2015 1745      Component Value Date/Time   CALCIUM 9.5 08/04/2015 0821   CALCIUM 9.1 07/15/2015 1745   ALKPHOS 54 08/04/2015 0821   ALKPHOS 57 07/15/2015 1745   AST 14 08/04/2015 0821   AST 15 07/15/2015 1745   ALT 11 08/04/2015 0821   ALT 13* 07/15/2015 1745   BILITOT 1.06 08/04/2015 0821   BILITOT 0.6 07/15/2015 1745       Lab Results  Component Value Date   WBC 2.2* 08/04/2015   HGB 11.3* 08/04/2015   HCT 33.9* 08/04/2015   MCV 108.7* 08/04/2015   PLT 167 08/04/2015   NEUTROABS 0.8* 08/04/2015     ASSESSMENT & PLAN:  Breast cancer of upper-outer quadrant of left female breast Left breast invasive ductal carcinoma grade 1; 4.8 cm with low-grade DCIS; margins negative 1/4 lymph nodes positive, ER 100% PR 100% HER-2 negative ratio 1.18 Ki-67 26% and 19% T2 N1 stage IIB status post adjuvant chemotherapy with dose dense Adriamycin and Cytoxan 4 followed by Abraxane weekly 12 Completed Adjuvant radiation therapy 10/17/14  Current Treatment: [name] study: Randomized to Ibrance + Anastrozole Started 10/27/2014; Today starts cycle 9  Toxicities: Neutropenia related to Ibrance: Held treatment 12/23/2014 and subsequently resumed treatment with recovery of blood counts. Ibrance held again for neutropenia and resumed at 100 mg daily from cycle 4. [place] 800 on 08/04/2015. holding treatment by protocol and recheck CBC in one week. If her counts improve, she will receive Ibrance at 75 mg dose. Patient was discussed in detail about neutropenic precautions.  Headaches and dizziness:Resolved (has Imitrex when necessary)  Denies nausea vomiting diarrhea, constipation. Denies myalgias or arthralgias or stiffness  Slipped disk: Patient see neurosurgery today. She is not interested in surgery. She wants to try conservative measures. Back pain is related to this. This is unrelated to the study  treatment.  Plan:Holding Ibrance.  Recheck CBC in one week and determine if the dose needs to be reduced to 75 mg.  Orders Placed This Encounter  Procedures  . CBC with Differential    Standing Status: Future     Number of Occurrences:      Standing Expiration Date: 08/03/2016  . Comprehensive metabolic panel    Standing Status: Future     Number of Occurrences:      Standing Expiration Date: 08/03/2016   The patient has a good understanding of the overall plan. she agrees with it. she will call with any problems that may develop before the next visit here.   Rulon Eisenmenger, MD 08/04/2015

## 2015-08-04 NOTE — Progress Notes (Signed)
[name]: Treatment Arm A, Cycle 9 (10 & 11) : TREATMENT HELD Patient in clinic today for labs, MD visit and the start of Cycle 8 (10 &11). I met with patient after her lab appointment and during her V/S assessment. Patient returned study dispensed bottles from Cycle 6, 7 and 8. In Cycle 6 bottle- 1 pill returned and in cycle 8 bottle- 2 pills returned. Per patient's medication diaries, patient documents to have missed 1 pill in cycle 7 and 1 pill in cycle 8. All three bottles returned to Pharmacy with Raul Del, PharmD, for documentation. 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 Patient reports back pain today at a 1 on pain score and reports to be taking prescription strength Naproxen, 550 mg 1- 2 tablets per day as needed. Patient denies any nausea, vomiting, bowel or bladder concerns, as well as fever. Patient denies any other issues or concerns at this time. MD review of today's labs, patient with Grade 3 Neutrophil count, and per MD and study, patient to be held and to return in one week for lab recheck. Neutropenic precautions reinforced with patient. All patient's questions answered to her satisfaction and patient encouraged to call clinic, MD or myself with any questions or concerns.  Concomitant medications reviewed by MD and this research nurse.  Patient to return 7/18 for lab/MD re eval for start of Cycle 9 (10 & 11)  BRITYN MASTROGIOVANNI [id number]  08/04/2015  Adverse Event Log  Study/Protocol: [name] Cycle: Start of Cycle 9  Event Grade Onset Date Resolved Date Attribution: Drug Name Palbociclib Attribution Drug Name Anastrozole Treatment Comments  Anemia  Grade 1 05/12/2015  Related  Not Related none   Decreased WBC Grade 2 08/04/2015  Related   Not Related none   Decreased Neutrophil Grade 3 08/04/2015  Related Not Related none Palbociclib held. Recheck labs in one week.  Back Pain  Grade 1 07/26/2015  Unrelated Unrelated Con Med F/U w/neurosurgeon  Peripheral motor neuropathy (LLE) Grade 2 07/14/15   Unrelated  Unrelated  None Related to Herniated disk. F/U w/neurosurgeon    Event Grade Onset Date Resolved Date Drug Name Attribution Treatment Comments  Back Pain Grade 2  05/2015 (reports about a month) 07/26/2015 Palbociclib unrelated  Pain related to herniated disc  Muscle weakness lower limb Grade 2 06/14/15  Palbociclib  unrelated  Per MRI - herniated disc pressing on nerve?  Herniated Lumbar Disc Grade 2 Dx 07/15/15  Palbiciclib unrelated      Adele Dan, RN, BSN Clinical Research 08/04/2015 10:14 AM

## 2015-08-04 NOTE — Telephone Encounter (Signed)
appt made and avs printed °

## 2015-08-08 ENCOUNTER — Other Ambulatory Visit: Payer: Self-pay | Admitting: Medical Oncology

## 2015-08-08 DIAGNOSIS — C50412 Malignant neoplasm of upper-outer quadrant of left female breast: Secondary | ICD-10-CM

## 2015-08-12 ENCOUNTER — Other Ambulatory Visit (HOSPITAL_BASED_OUTPATIENT_CLINIC_OR_DEPARTMENT_OTHER): Payer: 59

## 2015-08-12 ENCOUNTER — Telehealth: Payer: Self-pay | Admitting: Hematology and Oncology

## 2015-08-12 ENCOUNTER — Encounter: Payer: Self-pay | Admitting: Medical Oncology

## 2015-08-12 ENCOUNTER — Encounter: Payer: Self-pay | Admitting: Hematology and Oncology

## 2015-08-12 ENCOUNTER — Ambulatory Visit (HOSPITAL_BASED_OUTPATIENT_CLINIC_OR_DEPARTMENT_OTHER): Payer: 59 | Admitting: Hematology and Oncology

## 2015-08-12 VITALS — BP 116/72 | HR 87 | Temp 98.2°F | Resp 17 | Wt 156.8 lb

## 2015-08-12 DIAGNOSIS — C50412 Malignant neoplasm of upper-outer quadrant of left female breast: Secondary | ICD-10-CM

## 2015-08-12 DIAGNOSIS — Z006 Encounter for examination for normal comparison and control in clinical research program: Secondary | ICD-10-CM

## 2015-08-12 DIAGNOSIS — Z9012 Acquired absence of left breast and nipple: Secondary | ICD-10-CM | POA: Diagnosis not present

## 2015-08-12 DIAGNOSIS — Z170 Estrogen receptor positive status [ER+]: Secondary | ICD-10-CM | POA: Diagnosis not present

## 2015-08-12 DIAGNOSIS — Z79811 Long term (current) use of aromatase inhibitors: Secondary | ICD-10-CM

## 2015-08-12 LAB — COMPREHENSIVE METABOLIC PANEL
ALT: 14 U/L (ref 0–55)
ANION GAP: 9 meq/L (ref 3–11)
AST: 17 U/L (ref 5–34)
Albumin: 3.8 g/dL (ref 3.5–5.0)
Alkaline Phosphatase: 57 U/L (ref 40–150)
BILIRUBIN TOTAL: 0.57 mg/dL (ref 0.20–1.20)
BUN: 14.9 mg/dL (ref 7.0–26.0)
CALCIUM: 9.3 mg/dL (ref 8.4–10.4)
CO2: 26 meq/L (ref 22–29)
CREATININE: 0.8 mg/dL (ref 0.6–1.1)
Chloride: 109 mEq/L (ref 98–109)
EGFR: 76 mL/min/{1.73_m2} — AB (ref >90)
Glucose: 89 mg/dl (ref 70–140)
Potassium: 5.1 mEq/L (ref 3.5–5.1)
Sodium: 144 mEq/L (ref 136–145)
TOTAL PROTEIN: 6.4 g/dL (ref 6.4–8.3)

## 2015-08-12 LAB — CBC WITH DIFFERENTIAL/PLATELET
BASO%: 3.2 % — AB (ref 0.0–2.0)
Basophils Absolute: 0.1 10*3/uL (ref 0.0–0.1)
EOS%: 1.7 % (ref 0.0–7.0)
Eosinophils Absolute: 0 10*3/uL (ref 0.0–0.5)
HEMATOCRIT: 36.1 % (ref 34.8–46.6)
HGB: 12 g/dL (ref 11.6–15.9)
LYMPH#: 1.1 10*3/uL (ref 0.9–3.3)
LYMPH%: 41.1 % (ref 14.0–49.7)
MCH: 36.3 pg — ABNORMAL HIGH (ref 25.1–34.0)
MCHC: 33.3 g/dL (ref 31.5–36.0)
MCV: 108.8 fL — AB (ref 79.5–101.0)
MONO#: 0.5 10*3/uL (ref 0.1–0.9)
MONO%: 19.4 % — ABNORMAL HIGH (ref 0.0–14.0)
NEUT%: 34.6 % — AB (ref 38.4–76.8)
NEUTROS ABS: 1 10*3/uL — AB (ref 1.5–6.5)
PLATELETS: 237 10*3/uL (ref 145–400)
RBC: 3.32 10*6/uL — AB (ref 3.70–5.45)
RDW: 14.3 % (ref 11.2–14.5)
WBC: 2.8 10*3/uL — ABNORMAL LOW (ref 3.9–10.3)

## 2015-08-12 NOTE — Telephone Encounter (Signed)
appt made and avs printed °

## 2015-08-12 NOTE — Progress Notes (Signed)
[name] Cycle 9 (10 & 11)  Treatment Held Patient in today for recheck of CBC and CMET and MD visit d/t Grade 3 neutrophil counts last week on 7/10th. Patient's neutrophils (ANC) have recovered to 1.0, which is the minimum required to treat. MD wants to hold start of palbociclib to give patient another week to recover. Patient to be dose reduced to 75 mg, as required by study, with the start of this cycle next week. Patient confirms no new A/E's or changes since last week. Patient does report improvement in peripheral neuropathy to LLE and states has had no symptoms since last week Wednesday (7/12). Patient also reports muscle weakness to lower limb has resolved, as well, since last week Wednesday. Patient states that she has seen specialist for the herniated lumbar disc and per specialist, this is improving since patient is reporting improvement to symptoms, as well as improvement to back pain which continues some and is only occasional, per patient report. Patient denies any new medications or changes to current ones. All patient's questions answered to her satisfaction and patient was encouraged to call Dr. Lindi Adie or myself with any questions or concerns she may have.  Patient to return Monday 7/24 for dispense of palbociclib 75 mg and medication diaries.   RAJINDER ROSENBERRY [id number]  Adverse Event Log  Study/Protocol: [name] Cycle: Start of Cycle 9  Event Grade Onset Date Resolved Date Attribution: Drug Name Palbociclib Attribution Drug Name Anastrozole Treatment Comments  Anemia  Grade 1 05/12/2015 08/12/2015 Related  Not Related none   Decreased WBC Grade 2 08/04/2015  Related  Not Related none   Decreased Neutrophil Grade 3 08/04/2015 08/12/2015 Related Not Related none Palbociclib held. Recheck labs in one week.  Decreased Neutrophil Grade 2 08/12/2015  Related Not Related none   Back Pain  Grade 1 07/26/2015  Unrelated Unrelated Con Med F/U w/neurosurgeon  Peripheral motor neuropathy (LLE)  Grade 2 07/14/15 08/06/15 Unrelated  Unrelated  None Related to Herniated disk. F/U w/neurosurgeon    Event Grade Onset Date Resolved Date Drug Name Attribution Treatment Comments  Back Pain Grade 2  05/2015 (reports about a month) 07/26/2015 Palbociclib unrelated  Pain related to herniated disc  Muscle weakness lower limb Grade 2 06/14/15 08/06/2015 Palbociclib  unrelated  Per MRI - herniated disc pressing on nerve?  Herniated Lumbar Disc Grade 2 Dx 07/15/15  Palbiciclib unrelated     Adele Dan, RN, BSN Clinical Research 08/12/2015 4:11 PM

## 2015-08-12 NOTE — Progress Notes (Signed)
Patient Care Team: Antony Contras, MD as PCP - General (Family Medicine) Elsie Stain, MD (Pulmonary Disease) Rolm Bookbinder, MD as Consulting Physician (General Surgery) Nicholas Lose, MD as Consulting Physician (Hematology and Oncology) [name], MD as Consulting Physician (Radiation Oncology) Rockwell Germany, RN as Registered Nurse Mauro Kaufmann, RN as Registered Nurse Holley Bouche, NP as Nurse Practitioner (Nurse Practitioner) Sylvan Cheese, NP as Nurse Practitioner (Hematology and Oncology)  DIAGNOSIS: Breast cancer of upper-outer quadrant of left female breast [facility])   Staging form: Breast, AJCC 7th Edition     Clinical stage from 02/13/2014: Stage IIB (T3, N0, M0) - Unsigned       Staging comments: Staged at breast conference on 1.20.16      Pathologic stage from 03/11/2014: Stage IIB (T2, N1a, cM0) - Signed by Seward Grater, MD on 03/19/2014       Staging comments: Staged on final mastectomy specimen by Dr. Avis Epley    SUMMARY OF ONCOLOGIC HISTORY:   Breast cancer of upper-outer quadrant of left female breast ([place])   01/30/2014 Mammogram Left breast: 2.4 cm irregular high density mass with indistinct margin, middle depth, 6.8 cm from nipple, with architectural distortion. There is also a 3.5 cm irregular high density asymmetry at 1:00, anterior depth, with arch distortion and thickening.   01/30/2014 Breast US Left breast: 3.7 cm irregular mass with indistinct margin at 2:00, posterior deth, 4 cm from nipple, hypoechoic. 10 x 1 cm irregular mass with lobulated and indistinct margin at 3:00, posterior depth. A third 0.9 cm lobulated mass at 1:00, posterior depth   02/04/2014 Initial Biopsy Left breast core needle bx x 3: Invasive ductal carcinoma with DCIS ER 100%, PR 100%, HER-2 negative, Ki-67 ranged from 19-26%; third biopsy showed invasive mammary cancer with lobular features and intraductal papilloma, ER/PR positive HER-2 negative   02/11/2014 Breast MRI  Left breast: 3 masses that are confluent measuring 5.6 x 2.7 x 3 cm extending to the left aspect of the areola, appearing to involve the skin of theareola, deviating the nipple towards the left.  No axillary findings.  Neg rightt breast.   02/13/2014 Clinical Stage Stage IIB: T3 N0   03/07/2014 Definitive Surgery Left mastectomy/SLNB: invasive ductal carcinoma grade 1; 4.8 cm with low-grade DCIS; margins negative 1/4 lymph nodes positive, ER 100% PR 100% HER-2 negative ratio 1.18 Ki-67 26% and 19%    03/07/2014 Pathologic Stage Stage IIB (T2 N1)   04/03/2014 - 08/13/2014 Chemotherapy Adjuvant chemotherapy with dose dense adriamycin and cytoxan followed by weekly abraxane 12   09/09/2014 - 10/17/2014 Radiation Therapy Adjuvant RT [name]): Left chest wall 50.4 Gy over 28 fractions. Left supraclavicular fossa and axilla: 45 Gy over 25 fractions   10/27/2014 -  Anti-estrogen oral therapy Anastrozole 1 mg daily plus Ibrance ([name] Clinical Trial)   01/16/2015 Survivorship Survivorship visit completed and copy of care plan provided to patient.    CHIEF COMPLIANT: Follow-up to review blood work for [employer] study  INTERVAL HISTORY: Theresa Huff is a 65-year-old with above-mentioned history of left breast cancer currently on adjuvant therapy with anastrozole with Ibrance on clinical trial [name]. She had neutropenia with an [place] of 800 last week and is here today for recheck of the blood count. Today the [place] is 1000. She feels fairly normal. Denies any new complaints or concerns. She is planning to resume Ibrance next week.  REVIEW OF SYSTEMS:   Constitutional: Denies fevers, chills or abnormal weight loss, mild fatigue  which has improved Eyes: Denies blurriness of vision Ears, nose, mouth, throat, and face: Denies mucositis or sore throat Respiratory: Denies cough, dyspnea or wheezes Cardiovascular: Denies palpitation, chest discomfort Gastrointestinal:  Denies nausea, heartburn or change in bowel  habits Skin: Denies abnormal skin rashes Lymphatics: Denies new lymphadenopathy or easy bruising Neurological:Denies numbness, tingling or new weaknesses Behavioral/Psych: Mood is stable, no new changes  Extremities: No lower extremity edema Breast:  denies any pain or lumps or nodules in either breasts All other systems were reviewed with the patient and are negative.  I have reviewed the past medical history, past surgical history, social history and family history with the patient and they are unchanged from previous note.  ALLERGIES:  is allergic to hydrocodone; codeine; iodine; primidone; radiaplexrx; sulfa antibiotics; and tape.  MEDICATIONS:  Current Outpatient Prescriptions  Medication Sig Dispense Refill  . anastrozole (ARIMIDEX) 1 MG tablet Take 1 tablet (1 mg total) by mouth daily. 90 tablet 3  . calcium carbonate (OS-CAL) 600 MG TABS tablet Take 600 mg by mouth daily.    . cetirizine (ZYRTEC) 10 MG tablet Take 10 mg by mouth daily.      . Cholecalciferol (VITAMIN D PO) Take 2,000 Units by mouth daily.    . DULoxetine (CYMBALTA) 30 MG capsule Take 30 mg by mouth daily.     . Eszopiclone (ESZOPICLONE) 3 MG TABS Take 3 mg by mouth at bedtime. Take immediately before bedtime    . Investigational palbociclib (IBRANCE) 100 MG capsule [employer] AFT-05 [name] Take 1 capsule (100 mg total) by mouth daily. Take with food. Swallow whole. Do not chew. Take on days 1-21. Repeat every 28 days. 69 capsule 0  . naproxen sodium (ANAPROX) 550 MG tablet Take 550 mg by mouth 2 (two) times daily with a meal.    . omeprazole (PRILOSEC) 40 MG capsule Take 40 mg by mouth daily.  5  . SUMAtriptan (IMITREX) 50 MG tablet Take 1 tablet (50 mg total) by mouth every 2 (two) hours as needed for migraine. May repeat in 2 hours if headache persists or recurs. 10 tablet 0  . Vitamin D, Ergocalciferol, (DRISDOL) 50000 UNITS CAPS capsule Take 50,000 Units by mouth every 7 (seven) days.     No current  facility-administered medications for this visit.    PHYSICAL EXAMINATION: ECOG PERFORMANCE STATUS: 1 - Symptomatic but completely ambulatory  Filed Vitals:   08/12/15 0832  BP: 116/72  Pulse: 87  Temp: 98.2 F (36.8 C)  Resp: 17   Filed Weights   08/12/15 0832  Weight: 156 lb 12.8 oz (71.124 kg)    GENERAL:alert, no distress and comfortable SKIN: skin color, texture, turgor are normal, no rashes or significant lesions EYES: normal, Conjunctiva are pink and non-injected, sclera clear OROPHARYNX:no exudate, no erythema and lips, buccal mucosa, and tongue normal  NECK: supple, thyroid normal size, non-tender, without nodularity LYMPH:  no palpable lymphadenopathy in the cervical, axillary or inguinal LUNGS: clear to auscultation and percussion with normal breathing effort HEART: regular rate & rhythm and no murmurs and no lower extremity edema ABDOMEN:abdomen soft, non-tender and normal bowel sounds MUSCULOSKELETAL:no cyanosis of digits and no clubbing  NEURO: alert & oriented x 3 with fluent speech, no focal motor/sensory deficits EXTREMITIES: No lower extremity edema  LABORATORY DATA:  I have reviewed the data as listed   Chemistry      Component Value Date/Time   NA 144 08/12/2015 0823   NA 138 07/15/2015 1745   K 5.1  08/12/2015 0823   K 4.3 07/15/2015 1745   CL 101 07/15/2015 1745   CO2 26 08/12/2015 0823   CO2 29 07/15/2015 1745   BUN 14.9 08/12/2015 0823   BUN 12 07/15/2015 1745   CREATININE 0.8 08/12/2015 0823   CREATININE 0.96 07/15/2015 1745      Component Value Date/Time   CALCIUM 9.3 08/12/2015 0823   CALCIUM 9.1 07/15/2015 1745   ALKPHOS 57 08/12/2015 0823   ALKPHOS 57 07/15/2015 1745   AST 17 08/12/2015 0823   AST 15 07/15/2015 1745   ALT 14 08/12/2015 0823   ALT 13* 07/15/2015 1745   BILITOT 0.57 08/12/2015 0823   BILITOT 0.6 07/15/2015 1745       Lab Results  Component Value Date   WBC 2.8* 08/12/2015   HGB 12.0 08/12/2015   HCT 36.1  08/12/2015   MCV 108.8* 08/12/2015   PLT 237 08/12/2015   NEUTROABS 1.0* 08/12/2015     ASSESSMENT & PLAN:  Breast cancer of upper-outer quadrant of left female breast Left breast invasive ductal carcinoma grade 1; 4.8 cm with low-grade DCIS; margins negative 1/4 lymph nodes positive, ER 100% PR 100% HER-2 negative ratio 1.18 Ki-67 26% and 19% T2 N1 stage IIB status post adjuvant chemotherapy with dose dense Adriamycin and Cytoxan 4 followed by Abraxane weekly 12 Completed Adjuvant radiation therapy 10/17/14  Current Treatment: [name] study: Randomized to Ibrance + Anastrozole Started 10/27/2014; Today starts cycle 9  Toxicities: Neutropenia related to Ibrance: Held treatment 12/23/2014 and subsequently resumed treatment with recovery of blood counts. Ibrance held again for neutropenia and resumed at 100 mg daily from cycle 4. [place] 800 on 08/04/2015. held treatment by protocol, to resume Ibrance 08/18/2015 at 75 mg  Headaches and dizziness:Resolved (has Imitrex when necessary)  Denies nausea vomiting diarrhea, constipation. Denies myalgias or arthralgias or stiffness  Slipped disk: Patient see neurosurgery today. She is not interested in surgery. She wants to try conservative measures. Back pain is related to this. This is unrelated to the study treatment.  Plan:Resume Ibrance at 75 mg on 08/18/2015 Return to clinic in 3 months for follow-up.   patient has plans to travel to [place] to spend time with her husband and she'll be back in September.   Orders Placed This Encounter  Procedures  . CBC with Differential    Standing Status: Future     Number of Occurrences:      Standing Expiration Date: 08/11/2016  . Comprehensive metabolic panel    Standing Status: Future     Number of Occurrences:      Standing Expiration Date: 08/11/2016   The patient has a good understanding of the overall plan. she agrees with it. she will call with any problems that may develop before the next  visit here.   Rulon Eisenmenger, MD 08/12/2015

## 2015-08-12 NOTE — Assessment & Plan Note (Signed)
Left breast invasive ductal carcinoma grade 1; 4.8 cm with low-grade DCIS; margins negative 1/4 lymph nodes positive, ER 100% PR 100% HER-2 negative ratio 1.18 Ki-67 26% and 19% T2 N1 stage IIB status post adjuvant chemotherapy with dose dense Adriamycin and Cytoxan 4 followed by Abraxane weekly 12 Completed Adjuvant radiation therapy 10/17/14  Current Treatment: [name] study: Randomized to Ibrance + Anastrozole Started 10/27/2014; Today starts cycle 9  Toxicities: Neutropenia related to Ibrance: Held treatment 12/23/2014 and subsequently resumed treatment with recovery of blood counts. Ibrance held again for neutropenia and resumed at 100 mg daily from cycle 4. [place] 800 on 08/04/2015. held treatment by protocol   Headaches and dizziness:Resolved (has Imitrex when necessary)  Denies nausea vomiting diarrhea, constipation. Denies myalgias or arthralgias or stiffness  Slipped disk: Patient see neurosurgery today. She is not interested in surgery. She wants to try conservative measures. Back pain is related to this. This is unrelated to the study treatment.  Plan:

## 2015-08-18 ENCOUNTER — Encounter: Payer: 59 | Admitting: Medical Oncology

## 2015-08-18 ENCOUNTER — Encounter: Payer: Self-pay | Admitting: Medical Oncology

## 2015-08-18 DIAGNOSIS — C50412 Malignant neoplasm of upper-outer quadrant of left female breast: Secondary | ICD-10-CM

## 2015-08-18 MED ORDER — INV-PALBOCICLIB 75 MG CAPS #23 ALLIANCE FOUNDATION AFT-05 (PALLAS): 75.0000 mg | ORAL_CAPSULE | Freq: Every day | ORAL | 0 refills | Status: DC

## 2015-08-18 NOTE — Progress Notes (Signed)
[name]: Cycle 9 (10 & 11) Met with patient this morning for distribution and start of Cycle 9 (10 & 11). Patient denies any changes since visit with MD on the 18th. MD held start of Cycle 9 on the 18th due to Neurtrophils/ANC at 1.0, minimum required level to restart treatment, to give patient more recovery time. Delay of starting cycle 9 due to above stated reason confirmed with medical monitor, Dr. Sherrian Divers and a Note-to-File completed. Patient today reports no new AE's, side effects or symptoms and patient reports to be feeling good. Patient denies any changes to her medications. Per protocol patient was dose reduced to palbociclib 75 mg. Study assigned numbers for medication bottles of 75 mg palbociclib provided to PharmD, Mosetta Pigeon. Cycle 9, 10 and 11, 47m palbociclib medication bottles dispensed to patient along with medication diaries provided to patient for documenting self-dispense. All patient questions answered to her satisfaction, patient denies further questions at this time. Patient encouraged to call Dr. GLindi Adieor myself with any questions or concerns she may have.  Follow up lab/MD visit for Cycle 12 scheduled 11/10/2015 LAdele Dan RN, BSN Clinical Research 08/18/2015 9:29 AM

## 2015-10-05 ENCOUNTER — Other Ambulatory Visit: Payer: Self-pay | Admitting: Hematology and Oncology

## 2015-10-05 DIAGNOSIS — C50412 Malignant neoplasm of upper-outer quadrant of left female breast: Secondary | ICD-10-CM

## 2015-10-17 ENCOUNTER — Other Ambulatory Visit: Payer: Self-pay | Admitting: Family Medicine

## 2015-10-17 ENCOUNTER — Ambulatory Visit
Admission: RE | Admit: 2015-10-17 | Discharge: 2015-10-17 | Disposition: A | Discharge status: Home / Self Care | Payer: 59 | Source: Ambulatory Visit | Attending: Family Medicine | Admitting: Family Medicine

## 2015-10-17 DIAGNOSIS — R05 Cough: Secondary | ICD-10-CM

## 2015-10-17 DIAGNOSIS — R053 Chronic cough: Secondary | ICD-10-CM

## 2015-10-23 ENCOUNTER — Encounter: Payer: Self-pay | Admitting: Hematology and Oncology

## 2015-10-24 ENCOUNTER — Encounter: Payer: Self-pay | Admitting: Hematology and Oncology

## 2015-10-27 NOTE — Progress Notes (Signed)
Dr. Lindi Adie reviewed patient chart and imaging from 10/17/15.  Per Dr. Lindi Adie, I let the patient know that he is 100% sure this is not cancerous.  He believes it is radiation pneumonitis.  He will be happy to repeat scan in 3 months.  Pt voiced appreciation and understanding.

## 2015-11-06 ENCOUNTER — Other Ambulatory Visit: Payer: Self-pay | Admitting: Medical Oncology

## 2015-11-06 ENCOUNTER — Telehealth: Payer: Self-pay | Admitting: Medical Oncology

## 2015-11-06 DIAGNOSIS — C50412 Malignant neoplasm of upper-outer quadrant of left female breast: Secondary | ICD-10-CM

## 2015-11-06 NOTE — Telephone Encounter (Signed)
[name] LVMOM with patient regarding scheduled appointment on Monday for start of Cycle 12. Informed patient that there are two new study consent forms that I will need to review with her, regarding study information updates, during her visit. Informed patient that I am out of the office tomorrow, and should she have any questions she can call research assistant, Remer Macho.  Adele Dan, RN, BSN Clinical Research 11/06/2015 2:47 PM

## 2015-11-10 ENCOUNTER — Other Ambulatory Visit (HOSPITAL_BASED_OUTPATIENT_CLINIC_OR_DEPARTMENT_OTHER): Payer: 59

## 2015-11-10 ENCOUNTER — Encounter: Payer: Self-pay | Admitting: Hematology and Oncology

## 2015-11-10 ENCOUNTER — Ambulatory Visit (HOSPITAL_BASED_OUTPATIENT_CLINIC_OR_DEPARTMENT_OTHER): Payer: 59 | Admitting: Hematology and Oncology

## 2015-11-10 ENCOUNTER — Other Ambulatory Visit: Payer: Self-pay | Admitting: Hematology and Oncology

## 2015-11-10 ENCOUNTER — Encounter: Payer: Self-pay | Admitting: Medical Oncology

## 2015-11-10 DIAGNOSIS — D701 Agranulocytosis secondary to cancer chemotherapy: Secondary | ICD-10-CM | POA: Diagnosis not present

## 2015-11-10 DIAGNOSIS — C50412 Malignant neoplasm of upper-outer quadrant of left female breast: Secondary | ICD-10-CM

## 2015-11-10 DIAGNOSIS — Z006 Encounter for examination for normal comparison and control in clinical research program: Secondary | ICD-10-CM

## 2015-11-10 DIAGNOSIS — Z79811 Long term (current) use of aromatase inhibitors: Secondary | ICD-10-CM

## 2015-11-10 DIAGNOSIS — Z170 Estrogen receptor positive status [ER+]: Secondary | ICD-10-CM

## 2015-11-10 LAB — CBC WITH DIFFERENTIAL/PLATELET
BASO%: 1.6 % (ref 0.0–2.0)
BASOS ABS: 0 10*3/uL (ref 0.0–0.1)
EOS ABS: 0.1 10*3/uL (ref 0.0–0.5)
EOS%: 2.8 % (ref 0.0–7.0)
HCT: 37.7 % (ref 34.8–46.6)
HEMOGLOBIN: 12.6 g/dL (ref 11.6–15.9)
LYMPH%: 39.9 % (ref 14.0–49.7)
MCH: 34.5 pg — AB (ref 25.1–34.0)
MCHC: 33.4 g/dL (ref 31.5–36.0)
MCV: 103.3 fL — AB (ref 79.5–101.0)
MONO#: 0.4 10*3/uL (ref 0.1–0.9)
MONO%: 14.8 % — AB (ref 0.0–14.0)
NEUT#: 1 10*3/uL — ABNORMAL LOW (ref 1.5–6.5)
NEUT%: 40.9 % (ref 38.4–76.8)
Platelets: 190 10*3/uL (ref 145–400)
RBC: 3.65 10*6/uL — ABNORMAL LOW (ref 3.70–5.45)
RDW: 15.3 % — AB (ref 11.2–14.5)
WBC: 2.4 10*3/uL — ABNORMAL LOW (ref 3.9–10.3)
lymph#: 1 10*3/uL (ref 0.9–3.3)

## 2015-11-10 LAB — COMPREHENSIVE METABOLIC PANEL
ALBUMIN: 3.6 g/dL (ref 3.5–5.0)
ALK PHOS: 67 U/L (ref 40–150)
ALT: 9 U/L (ref 0–55)
AST: 12 U/L (ref 5–34)
Anion Gap: 10 mEq/L (ref 3–11)
BUN: 10.6 mg/dL (ref 7.0–26.0)
CALCIUM: 9.2 mg/dL (ref 8.4–10.4)
CHLORIDE: 106 meq/L (ref 98–109)
CO2: 26 mEq/L (ref 22–29)
Creatinine: 0.8 mg/dL (ref 0.6–1.1)
EGFR: 80 mL/min/{1.73_m2} — AB (ref >90)
GLUCOSE: 89 mg/dL (ref 70–140)
POTASSIUM: 4.1 meq/L (ref 3.5–5.1)
SODIUM: 142 meq/L (ref 136–145)
Total Bilirubin: 0.43 mg/dL (ref 0.20–1.20)
Total Protein: 6.5 g/dL (ref 6.4–8.3)

## 2015-11-10 NOTE — Progress Notes (Signed)
Patient Care Team: Antony Contras, MD as PCP - General (Family Medicine) Elsie Stain, MD (Pulmonary Disease) Rolm Bookbinder, MD as Consulting Physician (General Surgery) Nicholas Lose, MD as Consulting Physician (Hematology and Oncology) [name], MD as Consulting Physician (Radiation Oncology) Rockwell Germany, RN as Registered Nurse Mauro Kaufmann, RN as Registered Nurse Holley Bouche, NP as Nurse Practitioner (Nurse Practitioner) Sylvan Cheese, NP as Nurse Practitioner (Hematology and Oncology)  DIAGNOSIS: Breast cancer of upper-outer quadrant of left female breast [facility])   Staging form: Breast, AJCC 7th Edition   - Clinical stage from 02/13/2014: Stage IIB (T3, N0, M0) - Unsigned         Staging comments: Staged at breast conference on 1.20.16   - Pathologic stage from 03/11/2014: Stage IIB (T2, N1a, cM0) - Signed by Seward Grater, MD on 03/19/2014         Staging comments: Staged on final mastectomy specimen by Dr. Avis Epley  SUMMARY OF ONCOLOGIC HISTORY:   Breast cancer of upper-outer quadrant of left female breast ([place])   01/30/2014 Mammogram    Left breast: 2.4 cm irregular high density mass with indistinct margin, middle depth, 6.8 cm from nipple, with architectural distortion. There is also a 3.5 cm irregular high density asymmetry at 1:00, anterior depth, with arch distortion and thickening.      01/30/2014 Breast US    Left breast: 3.7 cm irregular mass with indistinct margin at 2:00, posterior deth, 4 cm from nipple, hypoechoic. 10 x 1 cm irregular mass with lobulated and indistinct margin at 3:00, posterior depth. A third 0.9 cm lobulated mass at 1:00, posterior depth      02/04/2014 Initial Biopsy    Left breast core needle bx x 3: Invasive ductal carcinoma with DCIS ER 100%, PR 100%, HER-2 negative, Ki-67 ranged from 19-26%; third biopsy showed invasive mammary cancer with lobular features and intraductal papilloma, ER/PR positive HER-2 negative      02/11/2014 Breast MRI    Left breast: 3 masses that are confluent measuring 5.6 x 2.7 x 3 cm extending to the left aspect of the areola, appearing to involve the skin of theareola, deviating the nipple towards the left.  No axillary findings.  Neg rightt breast.      02/13/2014 Clinical Stage    Stage IIB: T3 N0      03/07/2014 Definitive Surgery    Left mastectomy/SLNB: invasive ductal carcinoma grade 1; 4.8 cm with low-grade DCIS; margins negative 1/4 lymph nodes positive, ER 100% PR 100% HER-2 negative ratio 1.18 Ki-67 26% and 19%       03/07/2014 Pathologic Stage    Stage IIB (T2 N1)      04/03/2014 - 08/13/2014 Chemotherapy    Adjuvant chemotherapy with dose dense adriamycin and cytoxan followed by weekly abraxane 12      09/09/2014 - 10/17/2014 Radiation Therapy    Adjuvant RT [name]): Left chest wall 50.4 Gy over 28 fractions. Left supraclavicular fossa and axilla: 45 Gy over 25 fractions      10/27/2014 -  Anti-estrogen oral therapy    Anastrozole 1 mg daily plus Ibrance ([name] Clinical Trial)      01/16/2015 Survivorship    Survivorship visit completed and copy of care plan provided to patient.       CHIEF COMPLIANT: Follow-up of anastrozole plus Ibrance (on adjuvant clinical trial [name] )  INTERVAL HISTORY: Theresa Huff is a  65 year old with above-mentioned history of left breast cancer underwent surgery followed  by chemotherapy and radiation and is currently on adjuvant antiestrogen therapy. She has completed about one year of adjuvant antiestrogen therapy with anastrozole with Ibrance. She appears to be tolerating the treatment extremely well. Recently she had been to [place] and has returned back after spending time with her husband. She does not report any new side effects or concerns. She denies any hot flashes or myalgias. Her back is not bothering her.  REVIEW OF SYSTEMS:   Constitutional: Denies fevers, chills or abnormal weight loss Eyes: Denies blurriness  of vision Ears, nose, mouth, throat, and face: Denies mucositis or sore throat Respiratory: Denies cough, dyspnea or wheezes Cardiovascular: Denies palpitation, chest discomfort Gastrointestinal:  Denies nausea, heartburn or change in bowel habits Skin: Denies abnormal skin rashes Lymphatics: Denies new lymphadenopathy or easy bruising Neurological:Denies numbness, tingling or new weaknesses Behavioral/Psych: Mood is stable, no new changes  Extremities: No lower extremity edema Breast:  denies any pain or lumps or nodules in either breasts All other systems were reviewed with the patient and are negative.  I have reviewed the past medical history, past surgical history, social history and family history with the patient and they are unchanged from previous note.  ALLERGIES:  is allergic to hydrocodone; codeine; iodine; primidone; radiaplexrx [skin protectants, misc.]; sulfa antibiotics; and tape.  MEDICATIONS:  Current Outpatient Prescriptions  Medication Sig Dispense Refill  . anastrozole (ARIMIDEX) 1 MG tablet TAKE 1 TABLET BY MOUTH EVERY DAY 90 tablet 3  . calcium carbonate (OS-CAL) 600 MG TABS tablet Take 600 mg by mouth daily.    . cetirizine (ZYRTEC) 10 MG tablet Take 10 mg by mouth daily.      . Cholecalciferol (VITAMIN D PO) Take 2,000 Units by mouth daily.    . DULoxetine (CYMBALTA) 30 MG capsule Take 30 mg by mouth daily.     . Eszopiclone (ESZOPICLONE) 3 MG TABS Take 3 mg by mouth at bedtime. Take immediately before bedtime    . Investigational palbociclib (IBRANCE) 75 MG capsule [facility] AFT-05 [name] Take 1 capsule (75 mg total) by mouth daily. Take with food. Swallow whole. Do not chew. Take on days 1-21. Repeat every 28 days. 69 capsule 0  . naproxen sodium (ANAPROX) 550 MG tablet Take 550 mg by mouth 2 (two) times daily with a meal.    . omeprazole (PRILOSEC) 40 MG capsule Take 40 mg by mouth daily.  5  . SUMAtriptan (IMITREX) 50 MG tablet Take 1 tablet (50 mg  total) by mouth every 2 (two) hours as needed for migraine. May repeat in 2 hours if headache persists or recurs. 10 tablet 0  . Vitamin D, Ergocalciferol, (DRISDOL) 50000 UNITS CAPS capsule Take 50,000 Units by mouth every 7 (seven) days.     No current facility-administered medications for this visit.     PHYSICAL EXAMINATION: ECOG PERFORMANCE STATUS: 0 - Asymptomatic  Vitals:   11/10/15 0858  BP: 133/80  Pulse: 69  Resp: 17  Temp: 97.6 F (36.4 C)   Filed Weights   11/10/15 0858  Weight: 152 lb 14.4 oz (69.4 kg)    GENERAL:alert, no distress and comfortable SKIN: skin color, texture, turgor are normal, no rashes or significant lesions EYES: normal, Conjunctiva are pink and non-injected, sclera clear OROPHARYNX:no exudate, no erythema and lips, buccal mucosa, and tongue normal  NECK: supple, thyroid normal size, non-tender, without nodularity LYMPH:  no palpable lymphadenopathy in the cervical, axillary or inguinal LUNGS: clear to auscultation and percussion with normal breathing effort HEART: regular  rate & rhythm and no murmurs and no lower extremity edema ABDOMEN:abdomen soft, non-tender and normal bowel sounds MUSCULOSKELETAL:no cyanosis of digits and no clubbing  NEURO: alert & oriented x 3 with fluent speech, no focal motor/sensory deficits EXTREMITIES: No lower extremity edema  LABORATORY DATA:  I have reviewed the data as listed   Chemistry      Component Value Date/Time   NA 142 11/10/2015 0839   K 4.1 11/10/2015 0839   CL 101 07/15/2015 1745   CO2 26 11/10/2015 0839   BUN 10.6 11/10/2015 0839   CREATININE 0.8 11/10/2015 0839      Component Value Date/Time   CALCIUM 9.2 11/10/2015 0839   ALKPHOS 67 11/10/2015 0839   AST 12 11/10/2015 0839   ALT 9 11/10/2015 0839   BILITOT 0.43 11/10/2015 0839       Lab Results  Component Value Date   WBC 2.4 (L) 11/10/2015   HGB 12.6 11/10/2015   HCT 37.7 11/10/2015   MCV 103.3 (H) 11/10/2015   PLT 190  11/10/2015   NEUTROABS 1.0 (L) 11/10/2015     ASSESSMENT & PLAN:  Breast cancer of upper-outer quadrant of left female breast Left breast invasive ductal carcinoma grade 1; 4.8 cm with low-grade DCIS; margins negative 1/4 lymph nodes positive, ER 100% PR 100% HER-2 negative ratio 1.18 Ki-67 26% and 19% T2 N1 stage IIB status post adjuvant chemotherapy with dose dense Adriamycin and Cytoxan 4 followed by Abraxane weekly 12 Completed Adjuvant radiation therapy 10/17/14  Current Treatment: [name] study: Randomized to Ibrance + Anastrozole Started 10/27/2014; Today starts cycle 12  Toxicities: Neutropenia related to Ibrance: Held treatment 12/23/2014 and subsequently resumed treatment with recovery of blood counts. Ibrance held again for neutropenia and resumed at 100 mg daily from cycle 4. [place] 800 on 08/04/2015. held treatment by protocol, to resume Ibrance 08/18/2015 at 75 mg. today's blood work revealed [place] 1.0. Recommended remaining on the same dosage.   Headaches and dizziness:Resolved (has Imitrex when necessary)  Denies nausea vomiting diarrhea, constipation. Denies myalgias or arthralgias or stiffness  Slipped disk: Patient had seen neurosurgery. She is not interested in surgery. This is unrelated to the study treatment. In spite of for intense physical exercise in [place], she has not had any back problems.  Patient had been to travel to [place] to spent time with her husband.She is bicycle a lot to [place] and her back did not bother her. Alternatively  Plan:Continue Ibrance at 75 mg on 08/18/2015  Return to clinic in 3 months for follow-up.    No orders of the defined types were placed in this encounter.  The patient has a good understanding of the overall plan. she agrees with it. she will call with any problems that may develop before the next visit here.   Rulon Eisenmenger, MD 11/10/15

## 2015-11-10 NOTE — Assessment & Plan Note (Signed)
Left breast invasive ductal carcinoma grade 1; 4.8 cm with low-grade DCIS; margins negative 1/4 lymph nodes positive, ER 100% PR 100% HER-2 negative ratio 1.18 Ki-67 26% and 19% T2 N1 stage IIB status post adjuvant chemotherapy with dose dense Adriamycin and Cytoxan 4 followed by Abraxane weekly 12 Completed Adjuvant radiation therapy 10/17/14  Current Treatment: [name] study: Randomized to Ibrance + Anastrozole Started 10/27/2014; Today starts cycle 12  Toxicities: Neutropenia related to Ibrance: Held treatment 12/23/2014 and subsequently resumed treatment with recovery of blood counts. Ibrance held again for neutropenia and resumed at 100 mg daily from cycle 4. [place] 800 on 08/04/2015. held treatment by protocol, to resume Ibrance 08/18/2015 at 75 mg  Headaches and dizziness:Resolved (has Imitrex when necessary)  Denies nausea vomiting diarrhea, constipation. Denies myalgias or arthralgias or stiffness  Slipped disk: Patient had seen neurosurgery. She is not interested in surgery. She wants to try conservative measures. Back pain is related to this. This is unrelated to the study treatment.  Patient had been to travel to [place] to spent time with her husband. Plan:Ibrance at 75 mg on 08/18/2015 Return to clinic in 3 months for follow-up.

## 2015-11-10 NOTE — Progress Notes (Signed)
[name]: Cycle 12 (13 &14) I met with patient prior to her lab appointment and provided patient with this cycles PRO's to complete. Patient completed required labs. V/S completed. Patient returned medication diaries and study dispensed palbociclib bottles: #[id number]- empty, # [id number]- 2 pills returned, #[id number]- empty and these returned bottles were given to Pharm D. Kennith Center for confirmation and accountability. Patient also reports to have missed two dosages on her medication diary for cycle 10 (day 15 and 16). Patient denies any issues in self dispensing and documenting. Patient recently returned from trip to [place] and reports she had walked and biked quite a bit and denies to have had any difficulty in these activities. Patient denies any new or other side effects or concerns at this visit. Patient does report to have had pneumonia, symptoms started the day after she arrived in [place] 09/13/15 and resolved approximately the week of 11/03/15. When patient returned to [place]. She had a chest xray 10/17/15 that reported "suspicious for pneumonia." patient states she had taken a course of amoxicillin, prescription started 10/20/15 875-125 mg tablet, BID for 10 days. Patient with no issues today and denies any new or other changes to current medications. Per Dr. Geralyn Flash physical assessment of patient and review of all patient's labs as well as NCS labs, patient was cleared to start with Cycle 12. Patient's [place] @ 1.0, and within protocol treatable levels and per MD,to continue with 80m dosage of Palbociclib. Patient educated on neutropenic precautions and knows to call clinic with any new issues or concerns. Patient was provided with Cycle 12, 13 and 14 medication diaries, as well as, study dispensed palbociclib medication bottles # [id number] and [id number] dispensed by Pharm D. GKennith Centerand given to patient by this nurse. All patient's questions answered to her satisfaction and patient encouraged to call Dr. GLindi Adie or myself with any concerns or questions.  PRO's completed. Study required HgbA1c lab completed. Patient to return in 3 months (02/02/2016) for cycle 15 or sooner should she have any issues/concerns.  Informed patient that study has two new updated consent forms and reviewed both consent form revisions with patient today. Reviewed the changes to ICF Version 2.2, dated 08/18/2015 and informed patient that the only revision to this version is the [facility] has [facility]. Patient expressed understanding and denied any questions and proceeded to initial and date each page as well as sign and date where indicated. Patient was provided with a copy of her signed consent, Version 2.2, for her records. Reviewed with patient ICF Version 3.1, dated 09/19/15. Reviewed all changes together with patient in detail, which covered the side effects that have been reported in this ICF, after all patient's questions answered to her satisfaction, patient proceeded to initial and date each page and sign and date where indicated. Patient was provided with a copy of her signed consent, Version 3.1, for her records.  I thanked patient for her time today and continued support of study. LAdele Dan RN, BSN Clinical Research 11/10/2015 11:24 AM

## 2015-11-11 LAB — HEMOGLOBIN A1C
ESTIMATED AVERAGE GLUCOSE: 103 mg/dL
Hemoglobin A1c: 5.2 % (ref 4.8–5.6)

## 2015-11-11 MED ORDER — INV-PALBOCICLIB 75 MG CAPS #23 ALLIANCE FOUNDATION AFT-05 (PALLAS): 75.0000 mg | ORAL_CAPSULE | Freq: Every day | ORAL | 0 refills | Status: DC

## 2015-11-24 ENCOUNTER — Encounter: Payer: Self-pay | Admitting: Hematology and Oncology

## 2015-11-26 ENCOUNTER — Other Ambulatory Visit: Payer: Self-pay

## 2015-11-26 DIAGNOSIS — I890 Lymphedema, not elsewhere classified: Secondary | ICD-10-CM

## 2015-11-26 DIAGNOSIS — C50412 Malignant neoplasm of upper-outer quadrant of left female breast: Secondary | ICD-10-CM

## 2015-11-26 NOTE — Progress Notes (Signed)
Pt complaining of increased issues regarding lymphedema.  Referral entered for pt to be evaluated at lymphedema clinic.  Informed pt of this recommendation.

## 2015-11-28 ENCOUNTER — Ambulatory Visit: Payer: 59 | Attending: Hematology and Oncology | Admitting: Physical Therapy

## 2015-11-28 ENCOUNTER — Encounter: Payer: Self-pay | Admitting: Physical Therapy

## 2015-11-28 DIAGNOSIS — I972 Postmastectomy lymphedema syndrome: Secondary | ICD-10-CM | POA: Diagnosis not present

## 2015-11-28 DIAGNOSIS — R293 Abnormal posture: Secondary | ICD-10-CM | POA: Diagnosis present

## 2015-11-28 DIAGNOSIS — R609 Edema, unspecified: Secondary | ICD-10-CM | POA: Insufficient documentation

## 2015-11-28 DIAGNOSIS — M6281 Muscle weakness (generalized): Secondary | ICD-10-CM | POA: Diagnosis present

## 2015-11-28 NOTE — Therapy (Signed)
[place], [place], [address] Phone: [phone]   Fax:  [phone]  Physical Therapy Evaluation  Patient Details  Name: Theresa Huff MRN: [id number] Date of Birth: 04-22-1950 Referring Provider: Lindi Adie  Encounter Date: 11/28/2015      PT End of Session - 11/28/15 1039    Visit Number 1   Number of Visits 13   Date for PT Re-Evaluation 12/26/15   PT Start Time 0801   PT Stop Time 0845   PT Time Calculation (min) 44 min   Activity Tolerance Patient tolerated treatment well   Behavior During Therapy [facility] for tasks assessed/performed      Past Medical History:  Diagnosis Date  . Allergic rhinitis due to pollen   . Allergy   . Anemia    with pregnancy  . Breast cancer ([place])   . Cough   . Depressive disorder, not elsewhere classified   . GERD (gastroesophageal reflux disease)   . Hot flashes   . Insomnia, unspecified   . Plantar fasciitis   . Radiation 09/09/14-10/17/14   Left chestwall/supraclav.  Marland Kitchen Restless leg syndrome 09/09/2010  . Skin cancer   . Wears glasses   . Weight gain     Past Surgical History:  Procedure Laterality Date  . COLONOSCOPY    . MASTECTOMY W/ SENTINEL NODE BIOPSY Left 03/07/2014   Procedure: LEFT TOTAL MASTECTOMY WITH LEFT SENTINEL LYMPH NODE BIOPSY;  Surgeon: Rolm Bookbinder, MD;  Location: [place];  Service: General;  Laterality: Left;  Marland Kitchen MELANOMA EXCISION     right leg, left arm, right arm (several from each area)  . PORT-A-CATH REMOVAL N/A 12/04/2014   Procedure: REMOVAL PORT-A-CATH;  Surgeon: Rolm Bookbinder, MD;  Location: [place];  Service: General;  Laterality: N/A;  . PORTACATH PLACEMENT Right 04/01/2014   Procedure: INSERTION PORT-A-CATH;  Surgeon: Rolm Bookbinder, MD;  Location: [place];  Service: General;  Laterality: Right;  . TONSILLECTOMY      There were no vitals filed for this visit.       Subjective  Assessment - 11/28/15 0808    Subjective My left arm is swelling near the armpit but more than anything it is the swelling on the front of my arm at the elbow. It is very tender. I do the massage every day. I didn't think I needed to wear my compression sleeve all the time. Patient was only wearing compression sleeve when she travelled. I feel I have weakness in my left arm. I can not open a jar with that hand.    Pertinent History Diagnosed 01/30/14 with left ER/PR positive, HER2 negative grade I invasive ductal carcinoma breast cancer, left mastectomy 03/07/14, completed chemotherapy and radiation   Patient Stated Goals to get rid of the pain and swelling and to manage it   Currently in Pain? Yes   Pain Score 6    Pain Location Arm   Pain Orientation Left   Pain Descriptors / Indicators Discomfort   Pain Type Acute pain   Pain Onset 1 to 4 weeks ago   Aggravating Factors  sleeve and touching the area   Pain Relieving Factors not touching it, not wearing the sleeve            [facility] PT Assessment - 11/28/15 0001      Assessment   Medical Diagnosis Left breast cancer   Referring Provider Gudena   Onset Date/Surgical Date 03/07/14   Hand Dominance  Left   Prior Therapy physical therapy at this clinic for lymphedema last year     Precautions   Precautions Other (comment)  lymphedema, cancer     Restrictions   Weight Bearing Restrictions No     Balance Screen   Has the patient fallen in the past 6 months No   Has the patient had a decrease in activity level because of a fear of falling?  No   Is the patient reluctant to leave their home because of a fear of falling?  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   Other Surveys  --  LLIS: 37% impaired     Sensation   Light Touch --     Coordination   Gross Motor Movements are Fluid and Coordinated --     Posture/Postural Control   Posture/Postural Control Postural limitations   Postural Limitations Rounded Shoulders;Forward head     ROM / Strength   AROM / PROM / Strength AROM     AROM   Overall AROM  Within functional limits for tasks performed   Left Shoulder Flexion --   Left Shoulder ABduction --     Strength   Overall Strength Within functional limits for tasks performed  pt has left tricep weakness 3+/5   Right Shoulder Flexion --   Right Shoulder ABduction --   Left Shoulder Flexion --   Left Shoulder ABduction --     Palpation   Palpation comment palpable edema at forearm and axilla            LYMPHEDEMA/ONCOLOGY QUESTIONNAIRE - 11/28/15 0825      Type   Cancer Type left breast     Surgeries   Mastectomy Date 03/07/14   Sentinel Lymph Node Biopsy Date 03/07/14   Number Lymph Nodes Removed 4     Date Lymphedema/Swelling Started   Date 03/07/14     Treatment   Active Chemotherapy Treatment No   Past Chemotherapy Treatment Yes   Active Radiation Treatment No   Past Radiation Treatment Yes   Body Site left chest and axilla     What other symptoms do you have   Are  you Having Heaviness or Tightness Yes   Are you having Pain Yes   Are you having pitting edema No   Is it Hard or Difficult finding clothes that fit Yes   Do you have infections No   Is there Decreased scar mobility No     Lymphedema Assessments  Lymphedema Assessments Upper extremities     Right Upper Extremity Lymphedema   15 cm Proximal to Olecranon Process 28.5 cm   10 cm Proximal to Olecranon Process 27.5 cm   Olecranon Process 23.7 cm   15 cm Proximal to Ulnar Styloid Process 20.5 cm   10 cm Proximal to Ulnar Styloid Process 17.8 cm   Just Proximal to Ulnar Styloid Process 14.4 cm   Across Hand at [employer] 16.6 cm   At [place] of 2nd Digit 5.7 cm     Left Upper Extremity Lymphedema   15 cm Proximal to Olecranon Process 32 cm   10 cm Proximal to Olecranon Process 30.6 cm   Olecranon Process 25.1 cm   15 cm Proximal to Ulnar Styloid Process 22.8 cm   10 cm Proximal to Ulnar Styloid Process 19.1 cm   Just Proximal to Ulnar Styloid Process 14.7 cm   Across Hand at [employer] 17.2 cm   At [place] of 2nd Digit 5.5 cm           Quick Dash - 11/28/15 0001    Open a tight or new jar Unable   Do heavy household chores (wash walls, wash floors) Moderate difficulty   Carry a shopping bag or briefcase Mild difficulty   Wash your back Severe difficulty   Use a knife to cut food Mild difficulty   Recreational activities in which you take some force or impact through your arm, shoulder, or hand (golf, hammering, tennis) Mild difficulty   During the past week, to what extent has your arm, shoulder or hand problem interfered with your normal social activities with family, friends, neighbors, or groups? Modererately   During the past week, to what extent has your arm, shoulder or hand problem limited your work or other regular daily activities Slightly   Arm, shoulder, or hand pain. Moderate   Tingling (pins and needles) in your arm, shoulder, or hand None   Difficulty  Sleeping Mild difficulty   DASH Score 40.91 %                     PT Education - 11/28/15 0848    Education provided Yes   Education Details lymphedema risk reduction practices, speed of self drainage technique   Person(s) Educated Patient   Methods Explanation;Handout   Comprehension Verbalized understanding           Short Term Clinic Goals - 05/02/14 1648      CC Short Term Goal  #1   Title short term = long term   Status Achieved             Long Term Clinic Goals - 11/28/15 1044      CC Long Term Goal  #1   Title pt will verbalize understanding of lymphedema risk reduction practices   Time 4   Period Weeks   Status New     CC Long Term Goal  #2   Title Pt will reports 50% reduction in tightness and pain from axillary cording from axilla to antecubital fossa for improved comfort   Time 4   Period Weeks   Status New     CC Long Term Goal  #3   Title Pt will be able to open a jar to allow pt to return to PLOF   Time 4   Period Weeks   Status New     CC Long Term Goal  #4   Title pt  will have decrease in .5 cm left arm circumference at 15 cm proximal to the olecranon   Baseline 32 cm   Time 4   Period Weeks   Status New            Plan - 11/28/15 0845    Clinical Impression Statement Patient reports to PT with an increased in LUE lymphedema following treatmet for left breast cancer including a left mastectomy, sentinel lymph node biopsy, chemo and radiation. She has been compliant in doing self drainage technique daily. She has only been wearing her sleeve when she travels because she did not think she needed to wear it more than that. Patient has 2-3 cm increase in her left upper extremity compared to right with noticable swelling present at forearm and axilla. Patient demonstrates cording from axilla to forearm. Patient would benefit from skilled PT services to decrease LUE lymphedema and assist pt in becoming independent in managing  edema on her own. She also is having difficulty opening jars etc with her left upper extremity and would benefit from strengthening exercises.    Rehab Potential Good   Clinical Impairments Affecting Rehab Potential hx of radiation   PT Frequency 3x / week   PT Duration 4 weeks   PT Treatment/Interventions Therapeutic exercise;Patient/family education;ADLs/Self Care Home Management;Manual lymph drainage;Compression bandaging;Passive range of motion;Manual techniques;Taping;Orthotic Fit/Training   PT Next Visit Plan begin MLD to LUE, give tricep strengthening exercise for LUE   Consulted and Agree with Plan of Care Patient      Patient will benefit from skilled therapeutic intervention in order to improve the following deficits and impairments:  Increased edema, Decreased strength, Increased fascial restricitons, Pain  Visit Diagnosis: Postmastectomy lymphedema - Plan: PT plan of care cert/re-cert  Abnormal posture - Plan: PT plan of care cert/re-cert  Muscle weakness (generalized) - Plan: PT plan of care cert/re-cert     Problem List Patient Active Problem List   Diagnosis Date Noted  . Migraine 05/06/2014  . Breast cancer of upper-outer quadrant of left female breast ([place]) 02/06/2014  . OSA (obstructive sleep apnea) 09/09/2010  . Restless leg syndrome 09/09/2010  . Depressive disorder, not elsewhere classified   . Asthma, cough variant   . Insomnia, unspecified   . Allergic rhinitis due to pollen     Alexia Freestone 11/28/2015, 10:51 AM  [place], [place], [address] Phone: [phone]   Fax:  [phone]  Name: Theresa Huff MRN: [id number] Date of Birth: [birth year]  Allyson Sabal, PT 11/28/15 10:51 AM

## 2015-12-03 ENCOUNTER — Ambulatory Visit: Payer: 59 | Admitting: Physical Therapy

## 2015-12-03 DIAGNOSIS — M6281 Muscle weakness (generalized): Secondary | ICD-10-CM

## 2015-12-03 DIAGNOSIS — I972 Postmastectomy lymphedema syndrome: Secondary | ICD-10-CM

## 2015-12-03 NOTE — Patient Instructions (Signed)
Start by "hugging yourself":  Cross hands and do circles beside the neck and behind your collarbones 7 times.  Deep Effective Breath   Standing, sitting, or laying down, place both hands on the belly. Take a deep breath IN, expanding the belly; then breath OUT, contracting the belly. Repeat __5__ times. Do __2-3__ sessions per day and before your self massage.  http://gt2.exer.us/866   Copyright  VHI. All rights reserved.  Axilla to Axilla - stretch   On uninvolved side make 5 circles in the armpit, then pump _7__ times from involved armpit across chest to uninvolved armpit, making a pathway. Do _1__ time per day.  Copyright  VHI. All rights reserved.  Axilla to Inguinal Nodes -    On involved side, make 5 circles at groin at panty line, then pump _7__ times from armpit along side of trunk to outer hip, making your other pathway. Do __1_ time per day.  Copyright  VHI. All rights reserved.  Arm Posterior: Elbow to Shoulder - Sweep   Pump _5__ times from back of elbow to top of shoulder. Then inner to outer upper arm _5_ times, then outer arm again _5_ times. Then back to the pathways _2-3_ times. Do _1__ time per day.  Copyright  VHI. All rights reserved.  ARM: [place] Wrist to Elbow - Sweep   Pump or stationary circles _5__ times from wrist to elbow making sure to do both sides of the forearm. Then retrace your steps to the outer arm, and the pathways _2-3_ times each. Do _1__ time per day.  Copyright  VHI. All rights reserved.  ARM: Dorsum of Hand to Shoulder -   Pump or stationary circles _5__ times on back of hand including knuckle spaces and individual fingers if needed working up towards the wrist, then retrace all your steps working back up the forearm, doing both sides; upper outer arm and back to your pathways _2-3_ times each. Then do 5 circles again at uninvolved armpit and involved groin where you started! Good job!! Do __1_ time per day.  Copyright  VHI.  All rights reserved.   Remember to [facility] the skin rather than sliding over it. Use just enough pressure to stretch, not trying to push or force the fluid along.  [id number]

## 2015-12-03 NOTE — Therapy (Signed)
[place], [place], [address] Phone: [phone]   Fax:  [phone]  Physical Therapy Treatment  Patient Details  Name: Theresa Huff MRN: [id number] Date of Birth: [birth year] Referring Provider: Lindi Adie  Encounter Date: 12/03/2015      PT End of Session - 12/03/15 1658    Visit Number 2   Number of Visits 13   Date for PT Re-Evaluation 12/26/15   PT Start Time [id number]   PT Stop Time 1642   PT Time Calculation (min) 44 min   Activity Tolerance Patient tolerated treatment well   Behavior During Therapy [facility] for tasks assessed/performed      Past Medical History:  Diagnosis Date  . Allergic rhinitis due to pollen   . Allergy   . Anemia    with pregnancy  . Breast cancer ([place])   . Cough   . Depressive disorder, not elsewhere classified   . GERD (gastroesophageal reflux disease)   . Hot flashes   . Insomnia, unspecified   . Plantar fasciitis   . Radiation 09/09/14-10/17/14   Left chestwall/supraclav.  Marland Kitchen Restless leg syndrome 09/09/2010  . Skin cancer   . Wears glasses   . Weight gain     Past Surgical History:  Procedure Laterality Date  . COLONOSCOPY    . MASTECTOMY W/ SENTINEL NODE BIOPSY Left 03/07/2014   Procedure: LEFT TOTAL MASTECTOMY WITH LEFT SENTINEL LYMPH NODE BIOPSY;  Surgeon: Rolm Bookbinder, MD;  Location: [place];  Service: General;  Laterality: Left;  Marland Kitchen MELANOMA EXCISION     right leg, left arm, right arm (several from each area)  . PORT-A-CATH REMOVAL N/A 12/04/2014   Procedure: REMOVAL PORT-A-CATH;  Surgeon: Rolm Bookbinder, MD;  Location: [place];  Service: General;  Laterality: N/A;  . PORTACATH PLACEMENT Right 04/01/2014   Procedure: INSERTION PORT-A-CATH;  Surgeon: Rolm Bookbinder, MD;  Location: [place];  Service: General;  Laterality: Right;  . TONSILLECTOMY      There were no vitals filed for this visit.      Subjective  Assessment - 12/03/15 1602    Subjective Nothing new today.   Currently in Pain? Yes   Pain Score 4    Pain Location Elbow   Pain Orientation Left;Anterior   Pain Descriptors / Indicators Discomfort   Aggravating Factors  sleeve with elbow bent, and touching the area   Pain Relieving Factors not touching it, not bending the elbow with the sleeve on                         [facility] Adult PT Treatment/Exercise - 12/03/15 0001      Self-Care   Self-Care Other Self-Care Comments   Other Self-Care Comments  instructed patient in how to don her sleeve so that she doesn't stretch it out     Elbow Exercises   Elbow Extension Strengthening;Left;10 reps;Supine   Theraband Level (Elbow Extension) Level 2 (Red)  in sitting x 10   Bar Weights/Barbell (Elbow Extension) 2 lbs     Manual Therapy   Manual Therapy Manual Lymphatic Drainage (MLD)   Manual Lymphatic Drainage (MLD) In supine: short neck, superficial and deep abdomen, right axilla and anterior interaxillary anastomosis, left groin and axillo-inguinal anastomosis, and left from dorsal hand to shoulder.  Instructed patient as this was performed and made corrections to the way she had been doing this at home.  PT Education - 12/03/15 1658    Education provided Yes   Education Details self manual lymph drainage; Theraband strengthening for left triceps in sitting   Person(s) Educated Patient   Methods Explanation;Demonstration;Verbal cues;Handout  handout for manual lymph drainage; red Theraband issued for triceps strengthening   Comprehension Verbalized understanding                Long Term Clinic Goals - 11/28/15 1044      CC Long Term Goal  #1   Title pt will verbalize understanding of lymphedema risk reduction practices   Time 4   Period Weeks   Status New     CC Long Term Goal  #2   Title Pt will reports 50% reduction in tightness and pain from axillary cording from axilla to  antecubital fossa for improved comfort   Time 4   Period Weeks   Status New     CC Long Term Goal  #3   Title Pt will be able to open a jar to allow pt to return to PLOF   Time 4   Period Weeks   Status New     CC Long Term Goal  #4   Title pt will have decrease in .5 cm left arm circumference at 15 cm proximal to the olecranon   Baseline 32 cm   Time 4   Period Weeks   Status New            Plan - 12/03/15 1659    Clinical Impression Statement Patient did well today with learning corrections to what she has been doing at home with manual lymph drainage.  She also learned initial HEP of Theraband strengthening for left triceps.   Rehab Potential Good   Clinical Impairments Affecting Rehab Potential hx of radiation   PT Frequency 3x / week   PT Duration 4 weeks   PT Treatment/Interventions Therapeutic exercise;Patient/family education;ADLs/Self Care Home Management;Manual lymph drainage;Compression bandaging;Passive range of motion;Manual techniques;Taping;Orthotic Fit/Training   PT Next Visit Plan check triceps strengthening HEP; continue manual lymph drainage   PT Home Exercise Plan triceps strengthening with red Theraband, manual lymph drainage   Consulted and Agree with Plan of Care Patient      Patient will benefit from skilled therapeutic intervention in order to improve the following deficits and impairments:  Increased edema, Decreased strength, Increased fascial restricitons, Pain  Visit Diagnosis: Postmastectomy lymphedema  Muscle weakness (generalized)     Problem List Patient Active Problem List   Diagnosis Date Noted  . Migraine 05/06/2014  . Breast cancer of upper-outer quadrant of left female breast ([place]) 02/06/2014  . OSA (obstructive sleep apnea) 09/09/2010  . Restless leg syndrome 09/09/2010  . Depressive disorder, not elsewhere classified   . Asthma, cough variant   . Insomnia, unspecified   . Allergic rhinitis due to pollen      [facility] 12/03/2015, 5:05 PM  [place], [place], [address] Phone: [phone]   Fax:  [phone]  Name: Theresa Huff MRN: [id number] Date of Birth: Sep 04, 1950   Serafina Royals, PT 12/03/15 5:05 PM

## 2015-12-04 ENCOUNTER — Ambulatory Visit: Payer: 59

## 2015-12-04 DIAGNOSIS — M6281 Muscle weakness (generalized): Secondary | ICD-10-CM

## 2015-12-04 DIAGNOSIS — I972 Postmastectomy lymphedema syndrome: Secondary | ICD-10-CM | POA: Diagnosis not present

## 2015-12-04 NOTE — Therapy (Signed)
[facility] [address] [place], , [address] Phone: [phone]   Fax:  [phone]  Physical Therapy Treatment  Patient Details  Name: Theresa Huff MRN: [id number] Date of Birth: 12/07/1950 Referring Provider: Gudena  Encounter Date: 12/04/2015      PT End of Session - 12/04/15 0847    Visit Number 3   Number of Visits 13   Date for PT Re-Evaluation 12/26/15   PT Start Time 0802   PT Stop Time 0846   PT Time Calculation (min) 44 min   Activity Tolerance Patient tolerated treatment well   Behavior During Therapy WFL for tasks assessed/performed      Past Medical History:  Diagnosis Date  . Allergic rhinitis due to pollen   . Allergy   . Anemia    with pregnancy  . Breast cancer (HCC)   . Cough   . Depressive disorder, not elsewhere classified   . GERD (gastroesophageal reflux disease)   . Hot flashes   . Insomnia, unspecified   . Plantar fasciitis   . Radiation 09/09/14-10/17/14   Left chestwall/supraclav.  . Restless leg syndrome 09/09/2010  . Skin cancer   . Wears glasses   . Weight gain     Past Surgical History:  Procedure Laterality Date  . COLONOSCOPY    . MASTECTOMY W/ SENTINEL NODE BIOPSY Left 03/07/2014   Procedure: LEFT TOTAL MASTECTOMY WITH LEFT SENTINEL LYMPH NODE BIOPSY;  Surgeon: Matthew Wakefield, MD;  Location: [facility];  Service: General;  Laterality: Left;  . MELANOMA EXCISION     right leg, left arm, right arm (several from each area)  . PORT-A-CATH REMOVAL N/A 12/04/2014   Procedure: REMOVAL PORT-A-CATH;  Surgeon: Matthew Wakefield, MD;  Location:  [facility];  Service: General;  Laterality: N/A;  . PORTACATH PLACEMENT Right 04/01/2014   Procedure: INSERTION PORT-A-CATH;  Surgeon: Matthew Wakefield, MD;  Location: [facility];  Service: General;  Laterality: Right;  . TONSILLECTOMY      There were no vitals filed for this visit.      Subjective  Assessment - 12/04/15 0806    Subjective No changes since bering here yesterday afternooon.    Pertinent History Diagnosed 01/30/14 with left ER/PR positive, HER2 negative grade I invasive ductal carcinoma breast cancer, left mastectomy 03/07/14, completed chemotherapy and radiation   Patient Stated Goals to get rid of the pain and swelling and to manage it   Currently in Pain? Yes   Pain Score 4    Pain Location Elbow   Pain Orientation Left;Anterior   Pain Descriptors / Indicators Discomfort   Pain Type Acute pain   Pain Onset 1 to 4 weeks ago   Aggravating Factors  With elbow bent   Pain Relieving Factors not bending it and not touching it               LYMPHEDEMA/ONCOLOGY QUESTIONNAIRE - 12/04/15 0811      Left Upper Extremity Lymphedema   15 cm Proximal to Olecranon Process 29.8 cm   10 cm Proximal to Olecranon Process 29.6 cm   Olecranon Process 24.2 cm   15 cm Proximal to Ulnar Styloid Process 23 cm   10 cm Proximal to Ulnar Styloid Process 20.4 cm   Just Proximal to Ulnar Styloid Process 15.2 cm   Across Hand at Thumb Web Space 17.3 cm   At Base of 2nd Digit 5.7 cm                    [facility] Adult PT Treatment/Exercise - 12/04/15 0001      Elbow Exercises   Elbow Extension Strengthening;Left;10 reps;Supine   Theraband Level (Elbow Extension) Level 2 (Red)     Manual Therapy   Manual Therapy Manual Lymphatic Drainage (MLD)   Manual Lymphatic Drainage (MLD) In supine: short neck, superficial and deep abdomen, right axilla and anterior interaxillary anastomosis, left groin and axillo-inguinal anastomosis, and left from dorsal hand to shoulder.  Instructed patient as this was performed and made corrections to the way she had been doing this at home.                PT Education - 12/03/15 1658    Education provided Yes   Education Details self manual lymph drainage; Theraband strengthening for left triceps in sitting   Person(s) Educated Patient    Methods Explanation;Demonstration;Verbal cues;Handout  handout for manual lymph drainage; red Theraband issued for triceps strengthening   Comprehension Verbalized understanding                [facility] Goals - 12/04/15 0808      CC Long Term Goal  #1   Title pt will verbalize understanding of lymphedema risk reduction practices   Status On-going     CC Long Term Goal  #2   Title Pt will reports 50% reduction in tightness and pain from axillary cording from axilla to antecubital fossa for improved comfort   Baseline 5% improvement of the swelling and tightness-12/04/15   Status On-going     CC Long Term Goal  #3   Title Pt will be able to open a jar to allow pt to return to PLOF   Status On-going     CC Long Term Goal  #4   Title pt will have decrease in .5 cm left arm circumference at 15 cm proximal to the olecranon   Baseline 32 cm, 29.8 cm 12/03/15   Status Achieved            Plan - 12/04/15 0847    Clinical Impression Statement Pt did great with review of manual lymph drainage today verbalizing good understanding of sequence. Also did well with review of HEP performing this with good technique. Has had good reductions of circumference at upper arm meeting that goal, but some increase at her forearm.    Rehab Potential Good   Clinical Impairments Affecting Rehab Potential hx of radiation   PT Frequency 3x / week   PT Duration 4 weeks   PT Treatment/Interventions Therapeutic exercise;Patient/family education;ADLs/Self Care Home Management;Manual lymph drainage;Compression bandaging;Passive range of motion;Manual techniques;Taping;Orthotic Fit/Training   PT Next Visit Plan Continue manual lymph drainage and review prn.   Consulted and Agree with Plan of Care Patient      Patient will benefit from skilled therapeutic intervention in order to improve the following deficits and impairments:  Increased edema, Decreased strength, Increased fascial restricitons,  Pain  Visit Diagnosis: Postmastectomy lymphedema  Muscle weakness (generalized)     Problem List Patient Active Problem List   Diagnosis Date Noted  . Migraine 05/06/2014  . Breast cancer of upper-outer quadrant of left female breast (HCC) 02/06/2014  . OSA (obstructive sleep apnea) 09/09/2010  . Restless leg syndrome 09/09/2010  . Depressive disorder, not elsewhere classified   . Asthma, cough variant   . Insomnia, unspecified   . Allergic rhinitis due to pollen     Rosenberger, Valerie Ann, PTA 12/04/2015, 8:57 AM  [facility] [address] [place], [place], [address] Phone: [phone]   Fax:  [phone]  Name: Theresa Huff MRN: [id number] Date of Birth: 09/24/1950    

## 2015-12-11 ENCOUNTER — Ambulatory Visit: Payer: 59

## 2015-12-11 DIAGNOSIS — R293 Abnormal posture: Secondary | ICD-10-CM

## 2015-12-11 DIAGNOSIS — M6281 Muscle weakness (generalized): Secondary | ICD-10-CM

## 2015-12-11 DIAGNOSIS — I972 Postmastectomy lymphedema syndrome: Secondary | ICD-10-CM

## 2015-12-11 NOTE — Therapy (Signed)
[place], [place], [address] Phone: [phone]   Fax:  [phone]  Physical Therapy Treatment  Patient Details  Name: Theresa Huff MRN: [id number] Date of Birth: October 03, 1950 Referring Provider: Lindi Adie  Encounter Date: 12/11/2015      PT End of Session - 12/11/15 1156    Visit Number 4   Number of Visits 13   Date for PT Re-Evaluation 12/26/15   PT Start Time 1107   PT Stop Time 1150   PT Time Calculation (min) 43 min   Activity Tolerance Patient tolerated treatment well   Behavior During Therapy [facility] for tasks assessed/performed      Past Medical History:  Diagnosis Date  . Allergic rhinitis due to pollen   . Allergy   . Anemia    with pregnancy  . Breast cancer ([place])   . Cough   . Depressive disorder, not elsewhere classified   . GERD (gastroesophageal reflux disease)   . Hot flashes   . Insomnia, unspecified   . Plantar fasciitis   . Radiation 09/09/14-10/17/14   Left chestwall/supraclav.  Marland Kitchen Restless leg syndrome 09/09/2010  . Skin cancer   . Wears glasses   . Weight gain     Past Surgical History:  Procedure Laterality Date  . COLONOSCOPY    . MASTECTOMY W/ SENTINEL NODE BIOPSY Left 03/07/2014   Procedure: LEFT TOTAL MASTECTOMY WITH LEFT SENTINEL LYMPH NODE BIOPSY;  Surgeon: Rolm Bookbinder, MD;  Location: [facility];  Service: General;  Laterality: Left;  Marland Kitchen MELANOMA EXCISION     right leg, left arm, right arm (several from each area)  . PORT-A-CATH REMOVAL N/A 12/04/2014   Procedure: REMOVAL PORT-A-CATH;  Surgeon: Rolm Bookbinder, MD;  Location: [facility];  Service: General;  Laterality: N/A;  . PORTACATH PLACEMENT Right 04/01/2014   Procedure: INSERTION PORT-A-CATH;  Surgeon: Rolm Bookbinder, MD;  Location: [facility];  Service: General;  Laterality: Right;  . TONSILLECTOMY      There were no vitals filed for this visit.      Subjective  Assessment - 12/11/15 1110    Subjective I've been doing the self MLD and it's working. My arm pain and swelling is better. No pain right now, just some tenderness at my Lt anterior elbow.   Pertinent History Diagnosed 01/30/14 with left ER/PR positive, HER2 negative grade I invasive ductal carcinoma breast cancer, left mastectomy 03/07/14, completed chemotherapy and radiation   Patient Stated Goals to get rid of the pain and swelling and to manage it   Currently in Pain? No/denies               LYMPHEDEMA/ONCOLOGY QUESTIONNAIRE - 12/11/15 1111      Left Upper Extremity Lymphedema   15 cm Proximal to Olecranon Process 29.5 cm   10 cm Proximal to Olecranon Process 29.6 cm   Olecranon Process 24.1 cm   15 cm Proximal to Ulnar Styloid Process 23 cm   10 cm Proximal to Ulnar Styloid Process 20.1 cm   Just Proximal to Ulnar Styloid Process 14.9 cm   Across Hand at [employer] 17.1 cm   At [place] of 2nd Digit 5.8 cm                  [facility] Adult PT Treatment/Exercise - 12/11/15 0001      Shoulder Exercises: Standing   Flexion Strengthening;Both;10 reps;Weights  Standing against wall with core engaged   Shoulder Flexion Weight (  lbs) 1   ABduction Strengthening;Both;10 reps;Weights  Standing against wall with core engaged   Shoulder ABduction Weight (lbs) 1   Other Standing Exercises Bil scaption to shoulder height with 1 lb each hand 10 times each standing against wall with core engaged   Other Standing Exercises Lt UE tricep extension 10 times with 2 lbs using chair     Manual Therapy   Manual Lymphatic Drainage (MLD) In supine: short neck, superficial and deep abdomen, right axilla and anterior interaxillary anastomosis, left groin and axillo-inguinal anastomosis, and left from dorsal hand to shoulder.  Instructed patient as this was performed and made corrections to the way she had been doing this at home.                PT Education - 12/11/15 1154     Education provided Yes   Education Details Standing 3 way raises against wall with core engaged and tricep extension with weights   Person(s) Educated Patient   Methods Explanation;Demonstration  Pt reported not needing a handout for todays exercsise, she could remember them   Comprehension Verbalized understanding;Returned demonstration;Need further instruction  Need further instruction just to review                [facility] Goals - 12/11/15 1115      CC Long Term Goal  #1   Title pt will verbalize understanding of lymphedema risk reduction practices   Status Achieved     CC Long Term Goal  #2   Title Pt will reports 50% reduction in tightness and pain from axillary cording from axilla to antecubital fossa for improved comfort   Baseline 5% improvement of the swelling and tightness-12/04/15; 50% at least reported at this time-12/11/15   Status Achieved     CC Long Term Goal  #3   Title Pt will be able to open a jar to allow pt to return to PLOF   Baseline Haven't tried recently-12/11/15   Status On-going     CC Long Term Goal  #4   Title pt will have decrease in .5 cm left arm circumference at 15 cm proximal to the olecranon   Baseline 32 cm, 29.8 cm 12/03/15; 29.5 cm 12/11/15   Status Achieved            Plan - 12/11/15 1156    Clinical Impression Statement Pt has been doing very well with self manual lymph drainage at home and her circumferences had reduced well today with measurements. She also did well with progression of her HEP to include light weights (see flowsheet).    Rehab Potential Good   Clinical Impairments Affecting Rehab Potential hx of radiation   PT Frequency 3x / week   PT Duration 4 weeks   PT Treatment/Interventions Therapeutic exercise;Patient/family education;ADLs/Self Care Home Management;Manual lymph drainage;Compression bandaging;Passive range of motion;Manual techniques;Taping;Orthotic Fit/Training   PT Next Visit Plan Continue  manual lymph drainage and review prn; review exercises done today and see if pt needs a handout (she said she didn't today)   Consulted and Agree with Plan of Care Patient      Patient will benefit from skilled therapeutic intervention in order to improve the following deficits and impairments:  Increased edema, Decreased strength, Increased fascial restricitons, Pain  Visit Diagnosis: Postmastectomy lymphedema  Muscle weakness (generalized)  Abnormal posture     Problem List Patient Active Problem List   Diagnosis Date Noted  . Migraine 05/06/2014  . Breast cancer of upper-outer  quadrant of left female breast ([place]) 02/06/2014  . OSA (obstructive sleep apnea) 09/09/2010  . Restless leg syndrome 09/09/2010  . Depressive disorder, not elsewhere classified   . Asthma, cough variant   . Insomnia, unspecified   . Allergic rhinitis due to pollen     Otelia Limes, PTA 12/11/2015, 12:00 PM  [place], [place], [address] Phone: [phone]   Fax:  [phone]  Name: Theresa Huff MRN: [id number] Date of Birth: May 28, 1950

## 2015-12-12 ENCOUNTER — Encounter: Payer: 59 | Admitting: Physical Therapy

## 2015-12-15 ENCOUNTER — Ambulatory Visit: Payer: 59 | Admitting: Physical Therapy

## 2015-12-15 ENCOUNTER — Encounter: Payer: Self-pay | Admitting: Physical Therapy

## 2015-12-15 DIAGNOSIS — I972 Postmastectomy lymphedema syndrome: Secondary | ICD-10-CM | POA: Diagnosis not present

## 2015-12-15 NOTE — Therapy (Signed)
[place], [place], [address] Phone: [phone]   Fax:  [phone]  Physical Therapy Treatment  Patient Details  Name: Theresa Huff MRN: [id number] Date of Birth: November 01, 1950 Referring Provider: Lindi Adie  Encounter Date: 12/15/2015      PT End of Session - 12/15/15 0857    Visit Number 5   Number of Visits 13   Date for PT Re-Evaluation 12/26/15   PT Start Time 0807   PT Stop Time 0850   PT Time Calculation (min) 43 min   Activity Tolerance Patient tolerated treatment well   Behavior During Therapy [facility] for tasks assessed/performed      Past Medical History:  Diagnosis Date  . Allergic rhinitis due to pollen   . Allergy   . Anemia    with pregnancy  . Breast cancer ([place])   . Cough   . Depressive disorder, not elsewhere classified   . GERD (gastroesophageal reflux disease)   . Hot flashes   . Insomnia, unspecified   . Plantar fasciitis   . Radiation 09/09/14-10/17/14   Left chestwall/supraclav.  Marland Kitchen Restless leg syndrome 09/09/2010  . Skin cancer   . Wears glasses   . Weight gain     Past Surgical History:  Procedure Laterality Date  . COLONOSCOPY    . MASTECTOMY W/ SENTINEL NODE BIOPSY Left 03/07/2014   Procedure: LEFT TOTAL MASTECTOMY WITH LEFT SENTINEL LYMPH NODE BIOPSY;  Surgeon: Rolm Bookbinder, MD;  Location: [facility];  Service: General;  Laterality: Left;  Marland Kitchen MELANOMA EXCISION     right leg, left arm, right arm (several from each area)  . PORT-A-CATH REMOVAL N/A 12/04/2014   Procedure: REMOVAL PORT-A-CATH;  Surgeon: Rolm Bookbinder, MD;  Location: [facility];  Service: General;  Laterality: N/A;  . PORTACATH PLACEMENT Right 04/01/2014   Procedure: INSERTION PORT-A-CATH;  Surgeon: Rolm Bookbinder, MD;  Location: [facility];  Service: General;  Laterality: Right;  . TONSILLECTOMY      There were no vitals filed for this visit.      Subjective  Assessment - 12/15/15 0809    Subjective I missed on Friday because I had a stomach bug or something. This weekend I went with my daughter to [place] and did a lot of exercise. I could barely lift my arm.    Pertinent History Diagnosed 01/30/14 with left ER/PR positive, HER2 negative grade I invasive ductal carcinoma breast cancer, left mastectomy 03/07/14, completed chemotherapy and radiation   Patient Stated Goals to get rid of the pain and swelling and to manage it   Currently in Pain? No/denies   Pain Score 0-No pain                         [facility] Adult PT Treatment/Exercise - 12/15/15 0001      Manual Therapy   Manual Lymphatic Drainage (MLD) In supine: short neck, superficial and deep abdomen, right axilla and anterior interaxillary anastomosis, left groin and axillo-inguinal anastomosis, and left from dorsal hand to shoulder.  Instructed patient as this was performed and made corrections to the way she had been doing this at home.                        [facility] Goals - 12/11/15 1115      CC Long Term Goal  #1   Title pt will verbalize understanding of lymphedema risk  reduction practices   Status Achieved     CC Long Term Goal  #2   Title Pt will reports 50% reduction in tightness and pain from axillary cording from axilla to antecubital fossa for improved comfort   Baseline 5% improvement of the swelling and tightness-12/04/15; 50% at least reported at this time-12/11/15   Status Achieved     CC Long Term Goal  #3   Title Pt will be able to open a jar to allow pt to return to PLOF   Baseline Haven't tried recently-12/11/15   Status On-going     CC Long Term Goal  #4   Title pt will have decrease in .5 cm left arm circumference at 15 cm proximal to the olecranon   Baseline 32 cm, 29.8 cm 12/03/15; 29.5 cm 12/11/15   Status Achieved            Plan - 12/15/15 0900    Clinical Impression Statement Patient attended an exercise class  over the weekend and had a lot of UE soreness today. She used a 1lb weight during the exercise class and wore her sleeve. She did not notice an increase in swelling. Since she had so much muscle soreness exercises were held off today. Printed off HEP for pt today from exercises given last session. Focused on MLD on LUE today.    Clinical Impairments Affecting Rehab Potential hx of radiation   PT Frequency 3x / week   PT Duration 4 weeks   PT Treatment/Interventions Therapeutic exercise;Patient/family education;ADLs/Self Care Home Management;Manual lymph drainage;Compression bandaging;Passive range of motion;Manual techniques;Taping;Orthotic Fit/Training   PT Next Visit Plan Continue manual lymph drainage and review prn; review exercises performed last session   PT Home Exercise Plan triceps strengthening with red Theraband, manual lymph drainage, tricep extension 1 lb weight, shoulder flexion and abduction with 1 lb weight, bicep curl with 1 lb weight   Consulted and Agree with Plan of Care Patient      Patient will benefit from skilled therapeutic intervention in order to improve the following deficits and impairments:  Increased edema, Decreased strength, Increased fascial restricitons, Pain  Visit Diagnosis: Postmastectomy lymphedema     Problem List Patient Active Problem List   Diagnosis Date Noted  . Migraine 05/06/2014  . Breast cancer of upper-outer quadrant of left female breast ([place]) 02/06/2014  . OSA (obstructive sleep apnea) 09/09/2010  . Restless leg syndrome 09/09/2010  . Depressive disorder, not elsewhere classified   . Asthma, cough variant   . Insomnia, unspecified   . Allergic rhinitis due to pollen     Theresa Huff 12/15/2015, 9:05 AM  [place], [place], [address] Phone: [phone]   Fax:  [phone]  Name: Theresa Huff MRN: [id number] Date of Birth:  12/01/1950   Allyson Sabal, PT 12/15/15 9:05 AM

## 2015-12-15 NOTE — Patient Instructions (Signed)
Arm Curl    Sit or stand with feet shoulder width apart, arms straight down at sides, palms forward. Inhale, then exhale while slowly curling weights toward shoulders and keeping elbows touching torso. Slowly return to starting position. Repeat __10__ times per set. Do __1__ sets per session. Do _5___ sessions per week. May be done with dumbbells, tubing or resistive band.  Copyright  VHI. All rights reserved.  Progressive Resisted: Abduction (Standing)    Holding __1__ pound weights, raise arms out from sides. Repeat _10___ times per set. Do __1__ sets per session. Do _1___ sessions per day.  http://orth.exer.us/877   Copyright  VHI. All rights reserved.  Progressive Resisted: Flexion (Standing)    Holding __1__ pound weights, raise arms toward ceiling. Keep elbows straight. Repeat ___10_ times per set. Do _1___ sets per session. Do __1__ sessions per day.  http://orth.exer.us/871   Copyright  VHI. All rights reserved.  Elbow Extension    Stand or sit with one arm bent at 90, elbow back, palm facing side. Inhale, then exhale while extending lower arm back, keeping upper arm still. Hold 1 count. Slowly return to starting position. Repeat __10__ times per set. Do _1___ sets per session. Do _5___ sessions per week.  Copyright  VHI. All rights reserved.

## 2015-12-16 ENCOUNTER — Ambulatory Visit: Payer: 59

## 2015-12-16 DIAGNOSIS — M6281 Muscle weakness (generalized): Secondary | ICD-10-CM

## 2015-12-16 DIAGNOSIS — R293 Abnormal posture: Secondary | ICD-10-CM

## 2015-12-16 DIAGNOSIS — I972 Postmastectomy lymphedema syndrome: Secondary | ICD-10-CM | POA: Diagnosis not present

## 2015-12-16 NOTE — Therapy (Signed)
[facility] [place], [place], [address] Phone: [phone]   Fax:  [phone]  Physical Therapy Treatment  Patient Details  Name: Theresa Huff MRN: [id number] Date of Birth: 02/07/1950 Referring Provider: Lindi Adie  Encounter Date: 12/16/2015      PT End of Session - 12/16/15 1150    Visit Number 6   Number of Visits 13   Date for PT Re-Evaluation 12/26/15   PT Start Time 1112   PT Stop Time 1153   PT Time Calculation (min) 41 min   Activity Tolerance Patient tolerated treatment well   Behavior During Therapy [facility] for tasks assessed/performed      Past Medical History:  Diagnosis Date  . Allergic rhinitis due to pollen   . Allergy   . Anemia    with pregnancy  . Breast cancer ([place])   . Cough   . Depressive disorder, not elsewhere classified   . GERD (gastroesophageal reflux disease)   . Hot flashes   . Insomnia, unspecified   . Plantar fasciitis   . Radiation 09/09/14-10/17/14   Left chestwall/supraclav.  Marland Kitchen Restless leg syndrome 09/09/2010  . Skin cancer   . Wears glasses   . Weight gain     Past Surgical History:  Procedure Laterality Date  . COLONOSCOPY    . MASTECTOMY W/ SENTINEL NODE BIOPSY Left 03/07/2014   Procedure: LEFT TOTAL MASTECTOMY WITH LEFT SENTINEL LYMPH NODE BIOPSY;  Surgeon: Rolm Bookbinder, MD;  Location: [place];  Service: General;  Laterality: Left;  Marland Kitchen MELANOMA EXCISION     right leg, left arm, right arm (several from each area)  . PORT-A-CATH REMOVAL N/A 12/04/2014   Procedure: REMOVAL PORT-A-CATH;  Surgeon: Rolm Bookbinder, MD;  Location: [place];  Service: General;  Laterality: N/A;  . PORTACATH PLACEMENT Right 04/01/2014   Procedure: INSERTION PORT-A-CATH;  Surgeon: Rolm Bookbinder, MD;  Location: [facility];  Service: General;  Laterality: Right;  . TONSILLECTOMY      There were no vitals filed for this visit.      Subjective  Assessment - 12/16/15 1113    Subjective No changes from yesterday except my soreness is a little better.    Pertinent History Diagnosed 01/30/14 with left ER/PR positive, HER2 negative grade I invasive ductal carcinoma breast cancer, left mastectomy 03/07/14, completed chemotherapy and radiation   Patient Stated Goals to get rid of the pain and swelling and to manage it   Currently in Pain? No/denies                         [facility] Adult PT Treatment/Exercise - 12/16/15 0001      Shoulder Exercises: Supine   Horizontal ABduction Strengthening;Both;10 reps;Theraband   Theraband Level (Shoulder Horizontal ABduction) Level 2 (Red)   External Rotation Strengthening;Both;10 reps;Theraband   Theraband Level (Shoulder External Rotation) Level 2 (Red)   Flexion Strengthening;Both;10 reps;Theraband  Narrow and wide grip, 10 times each   Theraband Level (Shoulder Flexion) Level 2 (Red)   ABduction Strengthening;Both;10 reps;Theraband   Theraband Level (Shoulder ABduction) Level 2 (Red)   Other Supine Exercises Bil D2 with red theraband 10 times each UE returning therapist demonstration     Manual Therapy   Manual Lymphatic Drainage (MLD) In supine: short neck, superficial and deep abdomen, right axilla and anterior interaxillary anastomosis, left groin and axillo-inguinal anastomosis, and left from dorsal hand to shoulder.  Instructed patient as this was performed and  made corrections to the way she had been doing this at home.                PT Education - 12/16/15 1150    Education provided Yes   Education Details Supine scapular series with red theraband   Person(s) Educated Patient   Methods Explanation;Demonstration;Handout   Comprehension Verbalized understanding;Returned demonstration;Need further instruction                [facility] Goals - 12/16/15 1203      CC Long Term Goal  #1   Title pt will verbalize understanding of lymphedema risk  reduction practices   Status Achieved     CC Long Term Goal  #2   Title Pt will reports 50% reduction in tightness and pain from axillary cording from axilla to antecubital fossa for improved comfort   Baseline 5% improvement of the swelling and tightness-12/04/15; 50% at least reported at this time-12/11/15   Status Achieved     CC Long Term Goal  #3   Title Pt will be able to open a jar to allow pt to return to PLOF   Baseline Haven't tried recently-12/11/15; able to do yesterday but needed a hand grip-12/16/15   Status Partially Met     CC Long Term Goal  #4   Title pt will have decrease in .5 cm left arm circumference at 15 cm proximal to the olecranon   Baseline 32 cm, 29.8 cm 12/03/15; 29.5 cm 12/11/15   Status Achieved            Plan - 12/16/15 1151    Clinical Impression Statement Pt reports she felt better today from residual soreness from exercise class over weekend. Did well with instruction of new HEP of supine scapular series and continues to progress well. Was able to open a bottle yesterday but had to use a hand grip, so though she needed assist this is also progress.    Rehab Potential Good   Clinical Impairments Affecting Rehab Potential hx of radiation   PT Frequency 3x / week   PT Duration 4 weeks   PT Treatment/Interventions Therapeutic exercise;Patient/family education;ADLs/Self Care Home Management;Manual lymph drainage;Compression bandaging;Passive range of motion;Manual techniques;Taping;Orthotic Fit/Training   PT Next Visit Plan Continue manual lymph drainage and review prn; review HEP (supine scapular series and 3 way raises)   Consulted and Agree with Plan of Care Patient      Patient will benefit from skilled therapeutic intervention in order to improve the following deficits and impairments:  Increased edema, Decreased strength, Increased fascial restricitons, Pain  Visit Diagnosis: Postmastectomy lymphedema  Muscle weakness  (generalized)  Abnormal posture     Problem List Patient Active Problem List   Diagnosis Date Noted  . Migraine 05/06/2014  . Breast cancer of upper-outer quadrant of left female breast ([place]) 02/06/2014  . OSA (obstructive sleep apnea) 09/09/2010  . Restless leg syndrome 09/09/2010  . Depressive disorder, not elsewhere classified   . Asthma, cough variant   . Insomnia, unspecified   . Allergic rhinitis due to pollen     Otelia Limes, PTA 12/16/2015, 12:05 PM  [facility] [place], [place], [address] Phone: [phone]   Fax:  [phone]  Name: Theresa Huff MRN: [id number] Date of Birth: 1950/04/11

## 2015-12-16 NOTE — Patient Instructions (Signed)
Over Head Pull: Narrow and Wide Grip     Cancer Rehab [phone]   On back, knees bent, feet flat, band across thighs, elbows straight but relaxed. Pull hands apart (start). Keeping elbows straight, bring arms up and over head, hands toward floor. Keep pull steady on band. Hold momentarily. Return slowly, keeping pull steady, back to start. Then do with a Wider Grip. Repeat _10__ times. Band color _red_____   Side Pull: Double Arm   On back, knees bent, feet flat. Arms perpendicular to body, shoulder level, elbows straight but relaxed. Pull arms out to sides, elbows straight. Resistance band comes across collarbones, hands toward floor. Hold momentarily. Slowly return to starting position. Repeat _10__ times. Band color __red___   Sword   On back, knees bent, feet flat, left hand on left hip, right hand above left. Pull right arm DIAGONALLY (hip to shoulder) across chest. Bring right arm along head toward floor. Hold momentarily. Slowly return to starting position. Repeat _10__ times. Do with left arm. Band color _red_____   Shoulder Rotation: Double Arm   On back, knees bent, feet flat, elbows tucked at sides, bent 90, hands palms up. Pull hands apart and down toward floor, keeping elbows near sides. Hold momentarily. Slowly return to starting position. Repeat _10__ times. Band color __red____

## 2015-12-22 ENCOUNTER — Encounter: Payer: Self-pay | Admitting: Hematology and Oncology

## 2015-12-22 ENCOUNTER — Ambulatory Visit: Payer: 59

## 2015-12-22 DIAGNOSIS — I972 Postmastectomy lymphedema syndrome: Secondary | ICD-10-CM | POA: Diagnosis not present

## 2015-12-22 NOTE — Therapy (Signed)
[facility], [place], [address] Phone: [phone]   Fax:  [phone]  Physical Therapy Treatment  Patient Details  Name: Theresa Huff MRN: [id number] Date of Birth: 13-Feb-1950 Referring Provider: Lindi Adie  Encounter Date: 12/22/2015      PT End of Session - 12/22/15 0850    Visit Number 7   Number of Visits 13   Date for PT Re-Evaluation 12/26/15   PT Start Time 0801   PT Stop Time 0849   PT Time Calculation (min) 48 min   Activity Tolerance Patient tolerated treatment well   Behavior During Therapy [facility] for tasks assessed/performed      Past Medical History:  Diagnosis Date  . Allergic rhinitis due to pollen   . Allergy   . Anemia    with pregnancy  . Breast cancer ([place])   . Cough   . Depressive disorder, not elsewhere classified   . GERD (gastroesophageal reflux disease)   . Hot flashes   . Insomnia, unspecified   . Plantar fasciitis   . Radiation 09/09/14-10/17/14   Left chestwall/supraclav.  Marland Kitchen Restless leg syndrome 09/09/2010  . Skin cancer   . Wears glasses   . Weight gain     Past Surgical History:  Procedure Laterality Date  . COLONOSCOPY    . MASTECTOMY W/ SENTINEL NODE BIOPSY Left 03/07/2014   Procedure: LEFT TOTAL MASTECTOMY WITH LEFT SENTINEL LYMPH NODE BIOPSY;  Surgeon: Rolm Bookbinder, MD;  Location: [facility];  Service: General;  Laterality: Left;  Marland Kitchen MELANOMA EXCISION     right leg, left arm, right arm (several from each area)  . PORT-A-CATH REMOVAL N/A 12/04/2014   Procedure: REMOVAL PORT-A-CATH;  Surgeon: Rolm Bookbinder, MD;  Location: [facility];  Service: General;  Laterality: N/A;  . PORTACATH PLACEMENT Right 04/01/2014   Procedure: INSERTION PORT-A-CATH;  Surgeon: Rolm Bookbinder, MD;  Location: [facility];  Service: General;  Laterality: Right;  . TONSILLECTOMY      There were no vitals filed for this visit.      Subjective  Assessment - 12/22/15 0804    Subjective Doing okay but my hand swelled up Friday night and all day Saturday. Not sure what caused it but I do know when I wear my gauntlet my fingers swell up.     Pertinent History Diagnosed 01/30/14 with left ER/PR positive, HER2 negative grade I invasive ductal carcinoma breast cancer, left mastectomy 03/07/14, completed chemotherapy and radiation   Patient Stated Goals to get rid of the pain and swelling and to manage it   Currently in Pain? No/denies               LYMPHEDEMA/ONCOLOGY QUESTIONNAIRE - 12/22/15 0845      Left Upper Extremity Lymphedema   15 cm Proximal to Olecranon Process 29.7 cm   10 cm Proximal to Olecranon Process 29.5 cm   Olecranon Process 24.7 cm   15 cm Proximal to Ulnar Styloid Process 23.2 cm   10 cm Proximal to Ulnar Styloid Process 20.5 cm   Just Proximal to Ulnar Styloid Process 15.2 cm   Across Hand at [employer] 17.4 cm   At [place] of 2nd Digit 5.8 cm                  [facility] Adult PT Treatment/Exercise - 12/22/15 0001      Manual Therapy   Manual Lymphatic Drainage (MLD) In supine: short neck, superficial and deep  abdomen, right axilla and anterior interaxillary anastomosis, left groin and axillo-inguinal anastomosis, and left from dorsal hand to shoulder.  Instructed patient as this was performed and made corrections to the way she had been doing this at home.                        [facility] Goals - 12/16/15 1203      CC Long Term Goal  #1   Title pt will verbalize understanding of lymphedema risk reduction practices   Status Achieved     CC Long Term Goal  #2   Title Pt will reports 50% reduction in tightness and pain from axillary cording from axilla to antecubital fossa for improved comfort   Baseline 5% improvement of the swelling and tightness-12/04/15; 50% at least reported at this time-12/11/15   Status Achieved     CC Long Term Goal  #3   Title Pt will be able to  open a jar to allow pt to return to PLOF   Baseline Haven't tried recently-12/11/15; able to do yesterday but needed a hand grip-12/16/15   Status Partially Met     CC Long Term Goal  #4   Title pt will have decrease in .5 cm left arm circumference at 15 cm proximal to the olecranon   Baseline 32 cm, 29.8 cm 12/03/15; 29.5 cm 12/11/15   Status Achieved            Plan - 12/22/15 0851    Clinical Impression Statement Pt reports her hand had swollen over weekend and not sure why though did report she has been sleeping in her sleeve so advised pt to stop doing that. Instructed pt that it is time for her to get a new sleeve as it has been roughly 1.5 years since getting her last one so instructed pt that she could go t [facility] or [facility] and to call and make appt first. Advised pt that she will now require a glove instead of a gauntlet as her fingers have been swelling and to cont with a Class II.    Rehab Potential Good   Clinical Impairments Affecting Rehab Potential hx of radiation   PT Frequency 3x / week   PT Duration 4 weeks   PT Treatment/Interventions Therapeutic exercise;Patient/family education;ADLs/Self Care Home Management;Manual lymph drainage;Compression bandaging;Passive range of motion;Manual techniques;Taping;Orthotic Fit/Training   PT Next Visit Plan Continue manual lymph drainage and review prn; review HEP prn.   Consulted and Agree with Plan of Care Patient      Patient will benefit from skilled therapeutic intervention in order to improve the following deficits and impairments:  Increased edema, Decreased strength, Increased fascial restricitons, Pain  Visit Diagnosis: Postmastectomy lymphedema     Problem List Patient Active Problem List   Diagnosis Date Noted  . Migraine 05/06/2014  . Breast cancer of upper-outer quadrant of left female breast ([place]) 02/06/2014  . OSA (obstructive sleep apnea) 09/09/2010  . Restless leg syndrome 09/09/2010   . Depressive disorder, not elsewhere classified   . Asthma, cough variant   . Insomnia, unspecified   . Allergic rhinitis due to pollen     Otelia Limes, PTA 12/22/2015, 12:33 PM  [facility], [place], [address] Phone: [phone]   Fax:  [phone]  Name: Theresa Huff MRN: [id number] Date of Birth: 1950/02/27

## 2015-12-23 ENCOUNTER — Other Ambulatory Visit: Payer: Self-pay

## 2015-12-23 NOTE — Progress Notes (Signed)
Called pt regarding her email about feeling more fatigue than normal and unable to sleep through most nights. Pt states that she has had decreased appetite, lethargic, confused and weak. Pt on 2nd week of her Ibrance and on Arimidex as well. Told pt that Dr.Gudena is aware of her symptoms and will need to see her sooner. Pt states that she can come anytime next week. Told pt that we will send a schedule msg to make an appt for next week. Pt verbalized understanding. No other questions at this time. Told pt that someone will be contacting her to confirm appt for next week (lab and md)

## 2015-12-24 ENCOUNTER — Ambulatory Visit: Payer: 59

## 2015-12-24 ENCOUNTER — Encounter: Payer: 59 | Admitting: Physical Therapy

## 2015-12-24 DIAGNOSIS — I972 Postmastectomy lymphedema syndrome: Secondary | ICD-10-CM | POA: Diagnosis not present

## 2015-12-24 DIAGNOSIS — R609 Edema, unspecified: Secondary | ICD-10-CM

## 2015-12-24 DIAGNOSIS — R293 Abnormal posture: Secondary | ICD-10-CM

## 2015-12-24 NOTE — Therapy (Signed)
[place], [place], [address] Phone: [phone]   Fax:  [phone]  Physical Therapy Treatment  Patient Details  Name: Theresa Huff MRN: [id number] Date of Birth: [birth year] Referring Provider: Lindi Adie  Encounter Date: 12/24/2015      PT End of Session - 12/24/15 1630    Visit Number 8   Number of Visits 13   Date for PT Re-Evaluation 12/26/15   PT Start Time 0626   PT Stop Time 1518   PT Time Calculation (min) 44 min   Activity Tolerance Patient tolerated treatment well   Behavior During Therapy [facility] for tasks assessed/performed      Past Medical History:  Diagnosis Date  . Allergic rhinitis due to pollen   . Allergy   . Anemia    with pregnancy  . Breast cancer ([place])   . Cough   . Depressive disorder, not elsewhere classified   . GERD (gastroesophageal reflux disease)   . Hot flashes   . Insomnia, unspecified   . Plantar fasciitis   . Radiation 09/09/14-10/17/14   Left chestwall/supraclav.  Marland Kitchen Restless leg syndrome 09/09/2010  . Skin cancer   . Wears glasses   . Weight gain     Past Surgical History:  Procedure Laterality Date  . COLONOSCOPY    . MASTECTOMY W/ SENTINEL NODE BIOPSY Left 03/07/2014   Procedure: LEFT TOTAL MASTECTOMY WITH LEFT SENTINEL LYMPH NODE BIOPSY;  Surgeon: Rolm Bookbinder, MD;  Location: [place];  Service: General;  Laterality: Left;  Marland Kitchen MELANOMA EXCISION     right leg, left arm, right arm (several from each area)  . PORT-A-CATH REMOVAL N/A 12/04/2014   Procedure: REMOVAL PORT-A-CATH;  Surgeon: Rolm Bookbinder, MD;  Location: [facility];  Service: General;  Laterality: N/A;  . PORTACATH PLACEMENT Right 04/01/2014   Procedure: INSERTION PORT-A-CATH;  Surgeon: Rolm Bookbinder, MD;  Location: [facility];  Service: General;  Laterality: Right;  . TONSILLECTOMY      There were no vitals filed for this visit.      Subjective  Assessment - 12/24/15 1444    Subjective Doing okay, no changes from Monday.    Pertinent History Diagnosed 01/30/14 with left ER/PR positive, HER2 negative grade I invasive ductal carcinoma breast cancer, left mastectomy 03/07/14, completed chemotherapy and radiation   Patient Stated Goals to get rid of the pain and swelling and to manage it   Currently in Pain? No/denies            [facility] PT Assessment - 12/24/15 0001      Strength   Overall Strength --  Lt Tricep 4+/5                     [facility] Adult PT Treatment/Exercise - 12/24/15 0001      Manual Therapy   Manual Lymphatic Drainage (MLD) In supine: short neck, superficial and deep abdomen, right axilla and anterior interaxillary anastomosis, left groin and axillo-inguinal anastomosis, and left from dorsal hand to shoulder.  Instructed patient as this was performed and made corrections to the way she had been doing this at home.                        [facility] Goals - 12/24/15 1446      CC Long Term Goal  #1   Title pt will verbalize understanding of lymphedema risk reduction practices   Status Achieved  CC Long Term Goal  #2   Title Pt will reports 50% reduction in tightness and pain from axillary cording from axilla to antecubital fossa for improved comfort   Baseline 5% improvement of the swelling and tightness-12/04/15; 50% at least reported at this time-12/11/15; 65% reported at this time 12/24/15   Status Achieved     CC Long Term Goal  #3   Title Pt will be able to open a jar to allow pt to return to PLOF   Baseline Haven't tried recently-12/11/15; able to do yesterday but needed a hand grip-12/16/15   Status Partially Met     CC Long Term Goal  #4   Title pt will have decrease in .5 cm left arm circumference at 15 cm proximal to the olecranon   Baseline 32 cm, 29.8 cm 12/03/15; 29.5 cm 12/11/15; 29.7 cm 12/24/15   Status Achieved            Plan - 12/24/15 1631     Clinical Impression Statement Pt wants to get her nighttime and new daytime garments (cont with Class II) from [place] [facility] again so faxed pts demographics to them with order for daytime garments (didn't have a nighttime order as of yet). She has progressed very well and met all goals and feels ready for D/C at her next appt on Friday.   Rehab Potential Good   Clinical Impairments Affecting Rehab Potential hx of radiation   PT Frequency 3x / week   PT Duration 4 weeks   PT Treatment/Interventions Therapeutic exercise;Patient/family education;ADLs/Self Care Home Management;Manual lymph drainage;Compression bandaging;Passive range of motion;Manual techniques;Taping;Orthotic Fit/Training   PT Next Visit Plan D/C next visit.    Recommended Other Services Faxed pts demographics and partial order (have one for daytime, didn't know pt wanted nighttime when faxed) to [facility].    Consulted and Agree with Plan of Care Patient      Patient will benefit from skilled therapeutic intervention in order to improve the following deficits and impairments:  Increased edema, Decreased strength, Increased fascial restricitons, Pain  Visit Diagnosis: Postmastectomy lymphedema  Abnormal posture  Postoperative edema     Problem List Patient Active Problem List   Diagnosis Date Noted  . Migraine 05/06/2014  . Breast cancer of upper-outer quadrant of left female breast ([place]) 02/06/2014  . OSA (obstructive sleep apnea) 09/09/2010  . Restless leg syndrome 09/09/2010  . Depressive disorder, not elsewhere classified   . Asthma, cough variant   . Insomnia, unspecified   . Allergic rhinitis due to pollen     Otelia Limes, PTA 12/24/2015, 4:37 PM  [place], [place], [address] Phone: [phone]   Fax:  [phone]  Name: Theresa Huff MRN: [id number] Date of Birth: [birth year]

## 2015-12-25 ENCOUNTER — Telehealth: Payer: Self-pay | Admitting: Hematology and Oncology

## 2015-12-25 NOTE — Telephone Encounter (Signed)
lvm to inform pt of 12/6 appt date/times per LOS

## 2015-12-26 ENCOUNTER — Encounter: Payer: Self-pay | Admitting: Physical Therapy

## 2015-12-26 ENCOUNTER — Ambulatory Visit: Payer: 59 | Attending: Hematology and Oncology | Admitting: Physical Therapy

## 2015-12-26 DIAGNOSIS — I972 Postmastectomy lymphedema syndrome: Secondary | ICD-10-CM | POA: Diagnosis not present

## 2015-12-26 NOTE — Therapy (Signed)
[place], [place], [address] Phone: [phone]   Fax:  [phone]  Physical Therapy Treatment  Patient Details  Name: Theresa Huff MRN: [id number] Date of Birth: January 25, 1951 Referring Provider: Lindi Adie  Encounter Date: 12/26/2015      PT End of Session - 12/26/15 1216    Visit Number 9   Number of Visits 13   Date for PT Re-Evaluation 12/26/15   PT Start Time 0801   PT Stop Time 0845   PT Time Calculation (min) 44 min   Activity Tolerance Patient tolerated treatment well   Behavior During Therapy [facility] for tasks assessed/performed      Past Medical History:  Diagnosis Date  . Allergic rhinitis due to pollen   . Allergy   . Anemia    with pregnancy  . Breast cancer ([place])   . Cough   . Depressive disorder, not elsewhere classified   . GERD (gastroesophageal reflux disease)   . Hot flashes   . Insomnia, unspecified   . Plantar fasciitis   . Radiation 09/09/14-10/17/14   Left chestwall/supraclav.  Marland Kitchen Restless leg syndrome 09/09/2010  . Skin cancer   . Wears glasses   . Weight gain     Past Surgical History:  Procedure Laterality Date  . COLONOSCOPY    . MASTECTOMY W/ SENTINEL NODE BIOPSY Left 03/07/2014   Procedure: LEFT TOTAL MASTECTOMY WITH LEFT SENTINEL LYMPH NODE BIOPSY;  Surgeon: Rolm Bookbinder, MD;  Location: [place];  Service: General;  Laterality: Left;  Marland Kitchen MELANOMA EXCISION     right leg, left arm, right arm (several from each area)  . PORT-A-CATH REMOVAL N/A 12/04/2014   Procedure: REMOVAL PORT-A-CATH;  Surgeon: Rolm Bookbinder, MD;  Location: [place];  Service: General;  Laterality: N/A;  . PORTACATH PLACEMENT Right 04/01/2014   Procedure: INSERTION PORT-A-CATH;  Surgeon: Rolm Bookbinder, MD;  Location: [place];  Service: General;  Laterality: Right;  . TONSILLECTOMY      There were no vitals filed for this visit.      Subjective  Assessment - 12/26/15 0803    Subjective My swelling is better.    Pertinent History Diagnosed 01/30/14 with left ER/PR positive, HER2 negative grade I invasive ductal carcinoma breast cancer, left mastectomy 03/07/14, completed chemotherapy and radiation   Patient Stated Goals to get rid of the pain and swelling and to manage it   Currently in Pain? No/denies   Pain Score 0-No pain                         [facility] Adult PT Treatment/Exercise - 12/26/15 0001      Manual Therapy   Manual Lymphatic Drainage (MLD) In supine: short neck, superficial and deep abdomen, right axilla and anterior interaxillary anastomosis, left groin and axillo-inguinal anastomosis, and left from dorsal hand to shoulder.                         [facility] Goals - 12/24/15 1446      CC Long Term Goal  #1   Title pt will verbalize understanding of lymphedema risk reduction practices   Status Achieved     CC Long Term Goal  #2   Title Pt will reports 50% reduction in tightness and pain from axillary cording from axilla to antecubital fossa for improved comfort   Baseline 5% improvement of the swelling and tightness-12/04/15;  50% at least reported at this time-12/11/15; 65% reported at this time 12/24/15   Status Achieved     CC Long Term Goal  #3   Title Pt will be able to open a jar to allow pt to return to PLOF   Baseline Haven't tried recently-12/11/15; able to do yesterday but needed a hand grip-12/16/15   Status Partially Met     CC Long Term Goal  #4   Title pt will have decrease in .5 cm left arm circumference at 15 cm proximal to the olecranon   Baseline 32 cm, 29.8 cm 12/03/15; 29.5 cm 12/11/15; 29.7 cm 12/24/15   Status Achieved            Plan - 12/26/15 1216    Clinical Impression Statement Patient has met all goals for therapy. Her goal to open a water bottle is partially met because she has not done that recently but is able to open water bottles without  difficulty. She states she automatically uses an oven mitt with silicone to open jars at home. Patient states her swelling is under control and she is awaitng compression garments. Patient to be discharged from therapy at this time.    Rehab Potential Good   Clinical Impairments Affecting Rehab Potential hx of radiation   PT Frequency 3x / week   PT Duration 4 weeks   PT Treatment/Interventions Therapeutic exercise;Patient/family education;ADLs/Self Care Home Management;Manual lymph drainage;Compression bandaging;Passive range of motion;Manual techniques;Taping;Orthotic Fit/Training   PT Next Visit Plan d/c      Patient will benefit from skilled therapeutic intervention in order to improve the following deficits and impairments:  Increased edema, Decreased strength, Increased fascial restricitons, Pain  Visit Diagnosis: Postmastectomy lymphedema     Problem List Patient Active Problem List   Diagnosis Date Noted  . Migraine 05/06/2014  . Breast cancer of upper-outer quadrant of left female breast ([place]) 02/06/2014  . OSA (obstructive sleep apnea) 09/09/2010  . Restless leg syndrome 09/09/2010  . Depressive disorder, not elsewhere classified   . Asthma, cough variant   . Insomnia, unspecified   . Allergic rhinitis due to pollen     Alexia Freestone 12/26/2015, 12:19 PM  [place], [place], [address] Phone: [phone]   Fax:  [phone]  Name: Theresa Huff MRN: [id number] Date of Birth: 1950/02/12  PHYSICAL THERAPY DISCHARGE SUMMARY  Visits from Start of Care: 9 Current functional level related to goals / functional outcomes: Pt indep with self drainage and is awaiting garments -- see above   Remaining deficits: Pt is awaiting garments   Education / Equipment: HEP, self drainage Plan: Patient agrees to discharge.  Patient goals were partially met. Patient is being discharged due to  meeting the stated rehab goals.  ?????    Allyson Sabal, PT 12/26/15 12:20 PM

## 2015-12-30 NOTE — Assessment & Plan Note (Signed)
Left breast invasive ductal carcinoma grade 1; 4.8 cm with low-grade DCIS; margins negative 1/4 lymph nodes positive, ER 100% PR 100% HER-2 negative ratio 1.18 Ki-67 26% and 19% T2 N1 stage IIB status post adjuvant chemotherapy with dose dense Adriamycin and Cytoxan 4 followed by Abraxane weekly 12 Completed Adjuvant radiation therapy 10/17/14  Current Treatment: [name] study: Randomized to Ibrance + Anastrozole Started 10/27/2014; Today starts cycle 12  Toxicities: Neutropenia related to Ibrance: Held treatment 12/23/2014 and subsequently resumed treatment with recovery of blood counts. Ibrance held again for neutropenia and resumed at 100 mg daily from cycle 4. [place] 800 on 08/04/2015. held treatment by protocol, to resume Ibrance 08/18/2015 at 75 mg. today's blood work revealed [place] 1.0. Recommended remaining on the same dosage.  Headaches and dizziness:Resolved (has Imitrex when necessary)  Denies nausea vomiting diarrhea, constipation. Denies myalgias or arthralgias or stiffness  Slipped disk: Patient had seen neurosurgery. She is not interested in surgery. This is unrelated to the study treatment. In spite of for intense physical exercise in [place], she has not had any back problems.  Current Treatment: Ibrance at 75 mg on 08/18/2015 Fatigue:  Return to clinic in 3 months for follow-up.

## 2015-12-31 ENCOUNTER — Encounter: Payer: Self-pay | Admitting: Medical Oncology

## 2015-12-31 ENCOUNTER — Encounter: Payer: Self-pay | Admitting: Hematology and Oncology

## 2015-12-31 ENCOUNTER — Other Ambulatory Visit (HOSPITAL_BASED_OUTPATIENT_CLINIC_OR_DEPARTMENT_OTHER): Payer: 59

## 2015-12-31 ENCOUNTER — Ambulatory Visit (HOSPITAL_BASED_OUTPATIENT_CLINIC_OR_DEPARTMENT_OTHER): Payer: 59 | Admitting: Hematology and Oncology

## 2015-12-31 VITALS — BP 130/71 | HR 81 | Temp 97.4°F | Resp 18 | Wt 153.2 lb

## 2015-12-31 DIAGNOSIS — Z79811 Long term (current) use of aromatase inhibitors: Secondary | ICD-10-CM

## 2015-12-31 DIAGNOSIS — C50412 Malignant neoplasm of upper-outer quadrant of left female breast: Secondary | ICD-10-CM | POA: Diagnosis not present

## 2015-12-31 DIAGNOSIS — D701 Agranulocytosis secondary to cancer chemotherapy: Secondary | ICD-10-CM | POA: Diagnosis not present

## 2015-12-31 DIAGNOSIS — Z006 Encounter for examination for normal comparison and control in clinical research program: Secondary | ICD-10-CM | POA: Diagnosis not present

## 2015-12-31 DIAGNOSIS — Z170 Estrogen receptor positive status [ER+]: Secondary | ICD-10-CM

## 2015-12-31 DIAGNOSIS — R5383 Other fatigue: Secondary | ICD-10-CM

## 2015-12-31 LAB — COMPREHENSIVE METABOLIC PANEL
ALBUMIN: 3.6 g/dL (ref 3.5–5.0)
ALK PHOS: 61 U/L (ref 40–150)
ALT: 11 U/L (ref 0–55)
AST: 12 U/L (ref 5–34)
Anion Gap: 8 mEq/L (ref 3–11)
BILIRUBIN TOTAL: 0.66 mg/dL (ref 0.20–1.20)
BUN: 13.6 mg/dL (ref 7.0–26.0)
CALCIUM: 9.3 mg/dL (ref 8.4–10.4)
CO2: 27 mEq/L (ref 22–29)
Chloride: 108 mEq/L (ref 98–109)
Creatinine: 0.8 mg/dL (ref 0.6–1.1)
EGFR: 76 mL/min/{1.73_m2} — AB (ref >90)
Glucose: 81 mg/dl (ref 70–140)
POTASSIUM: 4.7 meq/L (ref 3.5–5.1)
Sodium: 143 mEq/L (ref 136–145)
TOTAL PROTEIN: 6.6 g/dL (ref 6.4–8.3)

## 2015-12-31 LAB — CBC WITH DIFFERENTIAL/PLATELET
BASO%: 1.2 % (ref 0.0–2.0)
BASOS ABS: 0 10*3/uL (ref 0.0–0.1)
EOS ABS: 0 10*3/uL (ref 0.0–0.5)
EOS%: 2.3 % (ref 0.0–7.0)
HEMATOCRIT: 36.5 % (ref 34.8–46.6)
HEMOGLOBIN: 11.9 g/dL (ref 11.6–15.9)
LYMPH#: 0.9 10*3/uL (ref 0.9–3.3)
LYMPH%: 41.4 % (ref 14.0–49.7)
MCH: 34.2 pg — AB (ref 25.1–34.0)
MCHC: 32.5 g/dL (ref 31.5–36.0)
MCV: 105.4 fL — AB (ref 79.5–101.0)
MONO#: 0.3 10*3/uL (ref 0.1–0.9)
MONO%: 13.6 % (ref 0.0–14.0)
NEUT#: 0.9 10*3/uL — ABNORMAL LOW (ref 1.5–6.5)
NEUT%: 41.5 % (ref 38.4–76.8)
Platelets: 159 10*3/uL (ref 145–400)
RBC: 3.46 10*6/uL — ABNORMAL LOW (ref 3.70–5.45)
RDW: 15.2 % — AB (ref 11.2–14.5)
WBC: 2.1 10*3/uL — ABNORMAL LOW (ref 3.9–10.3)

## 2015-12-31 NOTE — Progress Notes (Signed)
[name]: Patient in today for office visit with Dr. Lindi Adie due to experiencing grade 2 fatigue. I met with patient in exam room today to review symptoms with her. This is not a study required scheduled visit. Patient wanted to discuss and have assessment with Dr. Lindi Adie regarding her concern with the increase in fatigue she has been experiencing since approximately the end of October 2017. Patient reports that fatigue is daily and that she does not have the energy to do her usual daily activities and that everything "feels like a chore." patient's labs today resulted in Grade 3 Neutrophil count decreased. Patient has completed cycle 13 on 12/3 and is on day 3 of the 7 day break of palbociclib and reports fatigue improves during her break days. Due to today's grade 3 decrease in Neutrophil count, MD is having patient hold palbociclib an additional week and for patient to start her cycle 14th on 12/18th. Confirmed with patient that she knows that she will need to adjust her medication diaries to reflect the new start date. Patient reports at this visit to also be experiencing worsening insomnia, patient does have a history of insomnia, but states that it has also worsened approximately end of October, patient reports to have difficulty falling asleep and states it takes her up to several hours to fall asleep (Grade 3), patient confirms to be taking lunesta (eszopiclone) in the evenings but states that it does not work consistently for her, she is also reports (Grade 1) impairment to her concentration and attention.  Patient reporting a slight decrease in appetite since approximately end of October, today's weight reflects stability, will continue to follow. I confirmed with patient that she is not having any nausea, vomiting or abdominal pain. Patient also denies any aches or pains to muscles or joints and denies any fevers. Dr. Lindi Adie checking patient's TSH to rule out any possible thyroid involvement. At Dr.  Geralyn Flash request, I have emailed Dr. Sherrian Divers, the study Medical Monitor, to inquire if the study allows for the option of a 2 week on of palbociclib and 2 week off vs the 3 week on and one week off, to allow patient to continue with treatment of palbociclib.   Patient has completed physical therapy to left arm d/t postmastectomy lymphedema. PT from 11/3- 12/26/15. Symptoms of swelling and lymphedema (Grade 1) started approximately early October.   All patient's questions answered to her satisfaction today, patient strongly encouraged to contact Dr. Lindi Adie or myself with any questions or concerns she may have, as well as any new or worsening symptoms. Patient also aware that I will contact her regarding study response to alternate treatment days. Patient thanked for her time and support of study. Adele Dan, RN, BSN Clinical Research 12/31/2015 11:54 AM

## 2015-12-31 NOTE — Progress Notes (Signed)
Patient Care Team: Antony Contras, MD as PCP - General (Family Medicine) Elsie Stain, MD (Pulmonary Disease) Rolm Bookbinder, MD as Consulting Physician (General Surgery) Nicholas Lose, MD as Consulting Physician (Hematology and Oncology) [name], MD as Consulting Physician (Radiation Oncology) Rockwell Germany, RN as Registered Nurse Mauro Kaufmann, RN as Registered Nurse Holley Bouche, NP as Nurse Practitioner (Nurse Practitioner) Sylvan Cheese, NP as Nurse Practitioner (Hematology and Oncology)  DIAGNOSIS:  Encounter Diagnoses  Name Primary?  . Malignant neoplasm of upper-outer quadrant of left breast in female, estrogen receptor positive ([place])   . Fatigue, unspecified type Yes    SUMMARY OF ONCOLOGIC HISTORY:   Breast cancer of upper-outer quadrant of left female breast ([place])   01/30/2014 Mammogram    Left breast: 2.4 cm irregular high density mass with indistinct margin, middle depth, 6.8 cm from nipple, with architectural distortion. There is also a 3.5 cm irregular high density asymmetry at 1:00, anterior depth, with arch distortion and thickening.      01/30/2014 Breast US    Left breast: 3.7 cm irregular mass with indistinct margin at 2:00, posterior deth, 4 cm from nipple, hypoechoic. 10 x 1 cm irregular mass with lobulated and indistinct margin at 3:00, posterior depth. A third 0.9 cm lobulated mass at 1:00, posterior depth      02/04/2014 Initial Biopsy    Left breast core needle bx x 3: Invasive ductal carcinoma with DCIS ER 100%, PR 100%, HER-2 negative, Ki-67 ranged from 19-26%; third biopsy showed invasive mammary cancer with lobular features and intraductal papilloma, ER/PR positive HER-2 negative      02/11/2014 Breast MRI    Left breast: 3 masses that are confluent measuring 5.6 x 2.7 x 3 cm extending to the left aspect of the areola, appearing to involve the skin of theareola, deviating the nipple towards the left.  No axillary findings.   Neg rightt breast.      02/13/2014 Clinical Stage    Stage IIB: T3 N0      03/07/2014 Definitive Surgery    Left mastectomy/SLNB: invasive ductal carcinoma grade 1; 4.8 cm with low-grade DCIS; margins negative 1/4 lymph nodes positive, ER 100% PR 100% HER-2 negative ratio 1.18 Ki-67 26% and 19%       03/07/2014 Pathologic Stage    Stage IIB (T2 N1)      04/03/2014 - 08/13/2014 Chemotherapy    Adjuvant chemotherapy with dose dense adriamycin and cytoxan followed by weekly abraxane 12      09/09/2014 - 10/17/2014 Radiation Therapy    Adjuvant RT [name]): Left chest wall 50.4 Gy over 28 fractions. Left supraclavicular fossa and axilla: 45 Gy over 25 fractions      10/27/2014 -  Anti-estrogen oral therapy    Anastrozole 1 mg daily plus Ibrance ([name] Clinical Trial)      01/16/2015 Survivorship    Survivorship visit completed and copy of care plan provided to patient.       CHIEF COMPLIANT: Fatigue related to Ibrance  INTERVAL HISTORY: Theresa Huff is a 65-year-old with above-mentioned history of stage IIB left breast cancer treated with mastectomy followed by radiation and is currently on antiestrogen therapy with anastrozole. She is randomized to [facility] chemotherapy on clinical trial. She has had dose reductions because of cytopenias. She is here today to discuss her blood counts and her complaints of fatigue. The fatigue last week was so profound that she was unable to do much activity outside of her minimum  daily needs. This week she is feeling a lot better since she stopped the medication a few days ago. She was also wondering if it could be related to any thyroid dysfunction. So we will be obtaining a TSH today.  REVIEW OF SYSTEMS:   Constitutional: Denies fevers, chills or abnormal weight loss, complains of fatigue Eyes: Denies blurriness of vision Ears, nose, mouth, throat, and face: Denies mucositis or sore throat Respiratory: Denies cough, dyspnea or  wheezes Cardiovascular: Denies palpitation, chest discomfort Gastrointestinal:  Denies nausea, heartburn or change in bowel habits Skin: Denies abnormal skin rashes Lymphatics: Denies new lymphadenopathy or easy bruising Neurological:Denies numbness, tingling or new weaknesses Behavioral/Psych: Mood is stable, no new changes  Extremities: No lower extremity edema Breast:  denies any pain or lumps or nodules in either breasts All other systems were reviewed with the patient and are negative.  I have reviewed the past medical history, past surgical history, social history and family history with the patient and they are unchanged from previous note.  ALLERGIES:  is allergic to hydrocodone; codeine; iodine; primidone; radiaplexrx [skin protectants, misc.]; sulfa antibiotics; and tape.  MEDICATIONS:  Current Outpatient Prescriptions  Medication Sig Dispense Refill  . anastrozole (ARIMIDEX) 1 MG tablet TAKE 1 TABLET BY MOUTH EVERY DAY 90 tablet 3  . calcium carbonate (OS-CAL) 600 MG TABS tablet Take 600 mg by mouth daily.    . cetirizine (ZYRTEC) 10 MG tablet Take 10 mg by mouth daily.      . Cholecalciferol (VITAMIN D PO) Take 2,000 Units by mouth daily.    . DULoxetine (CYMBALTA) 30 MG capsule Take 30 mg by mouth daily.     . Eszopiclone (ESZOPICLONE) 3 MG TABS Take 3 mg by mouth at bedtime. Take immediately before bedtime    . Investigational palbociclib (IBRANCE) 75 MG capsule [facility] AFT-05 [name] Take 1 capsule (75 mg total) by mouth daily. Take with food. Swallow whole. Do not chew. Take on days 1-21. Repeat every 28 days. 69 capsule 0  . magnesium gluconate (MAGONATE) 500 MG tablet Take 500 mg by mouth once.    . naproxen sodium (ANAPROX) 550 MG tablet Take 550 mg by mouth 2 (two) times daily with a meal.    . omeprazole (PRILOSEC) 40 MG capsule Take 40 mg by mouth daily.  5  . SUMAtriptan (IMITREX) 50 MG tablet Take 1 tablet (50 mg total) by mouth every 2 (two) hours as  needed for migraine. May repeat in 2 hours if headache persists or recurs. 10 tablet 0  . Vitamin D, Ergocalciferol, (DRISDOL) 50000 UNITS CAPS capsule Take 50,000 Units by mouth every 7 (seven) days.     No current facility-administered medications for this visit.     PHYSICAL EXAMINATION: ECOG PERFORMANCE STATUS: 1 - Symptomatic but completely ambulatory  Vitals:   12/31/15 0942  BP: 130/71  Pulse: 81  Resp: 18  Temp: 97.4 F (36.3 C)   Filed Weights   12/31/15 0942  Weight: 153 lb 3.2 oz (69.5 kg)    GENERAL:alert, no distress and comfortable SKIN: skin color, texture, turgor are normal, no rashes or significant lesions EYES: normal, Conjunctiva are pink and non-injected, sclera clear OROPHARYNX:no exudate, no erythema and lips, buccal mucosa, and tongue normal  NECK: supple, thyroid normal size, non-tender, without nodularity LYMPH:  no palpable lymphadenopathy in the cervical, axillary or inguinal LUNGS: clear to auscultation and percussion with normal breathing effort HEART: regular rate & rhythm and no murmurs and no lower extremity edema  ABDOMEN:abdomen soft, non-tender and normal bowel sounds MUSCULOSKELETAL:no cyanosis of digits and no clubbing  NEURO: alert & oriented x 3 with fluent speech, no focal motor/sensory deficits EXTREMITIES: No lower extremity edema  LABORATORY DATA:  I have reviewed the data as listed   Chemistry      Component Value Date/Time   NA 143 12/31/2015 0924   K 4.7 12/31/2015 0924   CL 101 07/15/2015 1745   CO2 27 12/31/2015 0924   BUN 13.6 12/31/2015 0924   CREATININE 0.8 12/31/2015 0924      Component Value Date/Time   CALCIUM 9.3 12/31/2015 0924   ALKPHOS 61 12/31/2015 0924   AST 12 12/31/2015 0924   ALT 11 12/31/2015 0924   BILITOT 0.66 12/31/2015 0924       Lab Results  Component Value Date   WBC 2.1 (L) 12/31/2015   HGB 11.9 12/31/2015   HCT 36.5 12/31/2015   MCV 105.4 (H) 12/31/2015   PLT 159 12/31/2015    NEUTROABS 0.9 (L) 12/31/2015    ASSESSMENT & PLAN:  Breast cancer of upper-outer quadrant of left female breast Left breast invasive ductal carcinoma grade 1; 4.8 cm with low-grade DCIS; margins negative 1/4 lymph nodes positive, ER 100% PR 100% HER-2 negative ratio 1.18 Ki-67 26% and 19% T2 N1 stage IIB status post adjuvant chemotherapy with dose dense Adriamycin and Cytoxan 4 followed by Abraxane weekly 12 Completed Adjuvant radiation therapy 10/17/14  Current Treatment: [name] study: Randomized to Ibrance + Anastrozole Started 10/27/2014; Currently on cycle 14  Toxicities: Neutropenia related to Ibrance: Held treatment 12/23/2014 and subsequently resumed treatment with recovery of blood counts. Ibrance held again for neutropenia and resumed at 100 mg daily from cycle 4. [place] 800 on 08/04/2015. held treatment by protocol, to resume Ibrance 08/18/2015 at 75 mg. today's blood work revealed [place] 0.9.  Headaches and dizziness:Resolved (has Imitrex when necessary)  Denies nausea vomiting diarrhea, constipation. Denies myalgias or arthralgias or stiffness  Slipped disk: Patient had seen neurosurgery. Her symptoms have resolved.  Current Treatment: Ibrance at 75 mg on 08/18/2015 Fatigue: I discussed with her extensively that the fatigue could be related to [place]. We will discuss with the study if the dose of  Ibrance can be reduced to 2 weeks on 2 weeks off. I also encouraged that we hold off on resuming the treatment by another week.  Return to clinic in January for follow-up.    Orders Placed This Encounter  Procedures  . TSH    Standing Status:   Future    Standing Expiration Date:   12/30/2016   The patient has a good understanding of the overall plan. she agrees with it. she will call with any problems that may develop before the next visit here.   Rulon Eisenmenger, MD 12/31/15

## 2016-01-07 ENCOUNTER — Encounter: Payer: Self-pay | Admitting: Physical Therapy

## 2016-01-13 ENCOUNTER — Telehealth: Payer: Self-pay | Admitting: Medical Oncology

## 2016-01-13 NOTE — Telephone Encounter (Signed)
[name] I called patient this morning to inform her of study's response regarding Dr. Geralyn Flash question of whether patient could be on treatment for two weeks and off treatment for two vs 3 weeks on and one week off. Per Dr. Prudence Davidson, Medical Monitor, this scheduling is "not allowed and that persistent Grade 3 or Grade 4 neutropenia despite dose reduction to 75 mg, means she has to stop IP therapy." Dr. Lindi Adie informed and patient was called to discuss this. Patient stated she was disappointed that she would have to go off treatment, stated that she had a meeting she had to go to at the moment and asked if she could call me back in a couple hours. By end of day patient had not returned call. Will attempt to call patient in morning again.  Adele Dan, RN, BSN Clinical Research 01/13/2016 4:52 PM

## 2016-01-14 ENCOUNTER — Encounter: Payer: Self-pay | Admitting: Medical Oncology

## 2016-01-14 DIAGNOSIS — C50412 Malignant neoplasm of upper-outer quadrant of left female breast: Secondary | ICD-10-CM

## 2016-01-14 DIAGNOSIS — Z170 Estrogen receptor positive status [ER+]: Principal | ICD-10-CM

## 2016-01-14 NOTE — Progress Notes (Signed)
[name]: Spoke with patient this morning regarding patient coming off of study drug Palbociclib. Informed patient, per study, patient to stop palbociclib due to fourth event of Grade 3 Neutropenia despite dose reduction to lowest dose available of palbociclib 75 mg. Patient was provided with rationale to the decision for stopping palbociclib and she confirmed understanding of this. Dr. Lindi Adie aware of study response. Patient states she wishes to continue with study.  Patient reports to have taken last dose of palbociclib on 01/12/16 and confirms with this nurse she will no longer continue to take palbociclib from here on forward and knows to continue with her anti-hormone daily. Per study, patient will be continue to be followed for the anti-hormone part of the study and I will contact her for future appointments, within the next week, for return of medication diaries and collection of study dispensed palbociclib medication/bottle. All patient's questions answered to her satisfaction, patient knows to expect call from me for next appointment. Patient thanked for her time and continued support of study and was encouraged to contact Dr. Lindi Adie or myself for any questions or concerns she may have. Adele Dan, RN, BSN Clinical Research 01/14/2016 10:56 AM

## 2016-02-02 ENCOUNTER — Ambulatory Visit: Payer: 59 | Admitting: Hematology and Oncology

## 2016-02-02 ENCOUNTER — Other Ambulatory Visit: Payer: 59

## 2016-02-10 ENCOUNTER — Ambulatory Visit: Payer: 59 | Admitting: Hematology and Oncology

## 2016-02-10 ENCOUNTER — Other Ambulatory Visit: Payer: 59

## 2016-02-10 ENCOUNTER — Other Ambulatory Visit: Payer: Self-pay | Admitting: Medical Oncology

## 2016-02-10 DIAGNOSIS — Z170 Estrogen receptor positive status [ER+]: Principal | ICD-10-CM

## 2016-02-10 DIAGNOSIS — C50412 Malignant neoplasm of upper-outer quadrant of left female breast: Secondary | ICD-10-CM

## 2016-02-15 NOTE — Assessment & Plan Note (Signed)
Left breast invasive ductal carcinoma grade 1; 4.8 cm with low-grade DCIS; margins negative 1/4 lymph nodes positive, ER 100% PR 100% HER-2 negative ratio 1.18 Ki-67 26% and 19% T2 N1 stage IIB status post adjuvant chemotherapy with dose dense Adriamycin and Cytoxan 4 followed by Abraxane weekly 12 Completed Adjuvant radiation therapy 10/17/14  Current Treatment: [name] study: Randomized to Ibrance + Anastrozole Started 10/27/2014; Currently on cycle 14  Toxicities: Neutropenia related to Ibrance: Held treatment 12/23/2014 and subsequently resumed treatment with recovery of blood counts. Ibrance held again for neutropenia and resumed at 100 mg daily from cycle 4. [place] 800 on 08/04/2015. held treatment by protocol, resumed Ibrance 08/18/2015 at 75 mg.ANC was 0.9 and treatment was discontinued based on study protocol  Headaches and dizziness:Resolved (has Imitrex when necessary)  Fatigue related to Ibrance: Improved.  Treatment: Anastrozole 1 mg daily RTC in 6 months

## 2016-02-16 ENCOUNTER — Encounter: Payer: Self-pay | Admitting: Hematology and Oncology

## 2016-02-16 ENCOUNTER — Ambulatory Visit (HOSPITAL_BASED_OUTPATIENT_CLINIC_OR_DEPARTMENT_OTHER): Payer: 59 | Admitting: Hematology and Oncology

## 2016-02-16 ENCOUNTER — Other Ambulatory Visit (HOSPITAL_BASED_OUTPATIENT_CLINIC_OR_DEPARTMENT_OTHER): Payer: 59

## 2016-02-16 ENCOUNTER — Encounter: Payer: Self-pay | Admitting: Medical Oncology

## 2016-02-16 DIAGNOSIS — C50412 Malignant neoplasm of upper-outer quadrant of left female breast: Secondary | ICD-10-CM

## 2016-02-16 DIAGNOSIS — Z006 Encounter for examination for normal comparison and control in clinical research program: Secondary | ICD-10-CM

## 2016-02-16 DIAGNOSIS — M7522 Bicipital tendinitis, left shoulder: Secondary | ICD-10-CM | POA: Diagnosis not present

## 2016-02-16 DIAGNOSIS — Z170 Estrogen receptor positive status [ER+]: Secondary | ICD-10-CM

## 2016-02-16 DIAGNOSIS — Z9012 Acquired absence of left breast and nipple: Secondary | ICD-10-CM

## 2016-02-16 DIAGNOSIS — Z79811 Long term (current) use of aromatase inhibitors: Secondary | ICD-10-CM

## 2016-02-16 DIAGNOSIS — R5383 Other fatigue: Secondary | ICD-10-CM

## 2016-02-16 LAB — COMPREHENSIVE METABOLIC PANEL
ALBUMIN: 3.9 g/dL (ref 3.5–5.0)
ALK PHOS: 75 U/L (ref 40–150)
ALT: 20 U/L (ref 0–55)
ANION GAP: 8 meq/L (ref 3–11)
AST: 17 U/L (ref 5–34)
BUN: 17.7 mg/dL (ref 7.0–26.0)
CO2: 26 mEq/L (ref 22–29)
Calcium: 9.8 mg/dL (ref 8.4–10.4)
Chloride: 107 mEq/L (ref 98–109)
Creatinine: 0.8 mg/dL (ref 0.6–1.1)
EGFR: 77 mL/min/{1.73_m2} — ABNORMAL LOW (ref >90)
GLUCOSE: 89 mg/dL (ref 70–140)
POTASSIUM: 4.6 meq/L (ref 3.5–5.1)
SODIUM: 141 meq/L (ref 136–145)
Total Bilirubin: 0.82 mg/dL (ref 0.20–1.20)
Total Protein: 6.6 g/dL (ref 6.4–8.3)

## 2016-02-16 LAB — CBC WITH DIFFERENTIAL/PLATELET
BASO%: 0.6 % (ref 0.0–2.0)
Basophils Absolute: 0 10*3/uL (ref 0.0–0.1)
EOS ABS: 0.3 10*3/uL (ref 0.0–0.5)
EOS%: 5.8 % (ref 0.0–7.0)
HCT: 39.8 % (ref 34.8–46.6)
HEMOGLOBIN: 13 g/dL (ref 11.6–15.9)
LYMPH%: 22.2 % (ref 14.0–49.7)
MCH: 33.3 pg (ref 25.1–34.0)
MCHC: 32.7 g/dL (ref 31.5–36.0)
MCV: 102.1 fL — ABNORMAL HIGH (ref 79.5–101.0)
MONO#: 0.6 10*3/uL (ref 0.1–0.9)
MONO%: 12.7 % (ref 0.0–14.0)
NEUT#: 2.9 10*3/uL (ref 1.5–6.5)
NEUT%: 58.7 % (ref 38.4–76.8)
PLATELETS: 240 10*3/uL (ref 145–400)
RBC: 3.9 10*6/uL (ref 3.70–5.45)
RDW: 13 % (ref 11.2–14.5)
WBC: 5 10*3/uL (ref 3.9–10.3)
lymph#: 1.1 10*3/uL (ref 0.9–3.3)

## 2016-02-16 LAB — TSH: TSH: 1.217 m(IU)/L (ref 0.308–3.960)

## 2016-02-16 NOTE — Progress Notes (Signed)
Patient Care Team: Antony Contras, MD as PCP - General (Family Medicine) Elsie Stain, MD (Pulmonary Disease) Rolm Bookbinder, MD as Consulting Physician (General Surgery) Nicholas Lose, MD as Consulting Physician (Hematology and Oncology) [name], MD as Consulting Physician (Radiation Oncology) Rockwell Germany, RN as Registered Nurse Mauro Kaufmann, RN as Registered Nurse Holley Bouche, NP as Nurse Practitioner (Nurse Practitioner) Sylvan Cheese, NP as Nurse Practitioner (Hematology and Oncology)  DIAGNOSIS:  Encounter Diagnosis  Name Primary?  . Malignant neoplasm of upper-outer quadrant of left breast in female, estrogen receptor positive ([place])     SUMMARY OF ONCOLOGIC HISTORY:   Breast cancer of upper-outer quadrant of left female breast ([place])   01/30/2014 Mammogram    Left breast: 2.4 cm irregular high density mass with indistinct margin, middle depth, 6.8 cm from nipple, with architectural distortion. There is also a 3.5 cm irregular high density asymmetry at 1:00, anterior depth, with arch distortion and thickening.      01/30/2014 Breast US    Left breast: 3.7 cm irregular mass with indistinct margin at 2:00, posterior deth, 4 cm from nipple, hypoechoic. 10 x 1 cm irregular mass with lobulated and indistinct margin at 3:00, posterior depth. A third 0.9 cm lobulated mass at 1:00, posterior depth      02/04/2014 Initial Biopsy    Left breast core needle bx x 3: Invasive ductal carcinoma with DCIS ER 100%, PR 100%, HER-2 negative, Ki-67 ranged from 19-26%; third biopsy showed invasive mammary cancer with lobular features and intraductal papilloma, ER/PR positive HER-2 negative      02/11/2014 Breast MRI    Left breast: 3 masses that are confluent measuring 5.6 x 2.7 x 3 cm extending to the left aspect of the areola, appearing to involve the skin of theareola, deviating the nipple towards the left.  No axillary findings.  Neg rightt breast.      02/13/2014 Clinical Stage    Stage IIB: T3 N0      03/07/2014 Definitive Surgery    Left mastectomy/SLNB: invasive ductal carcinoma grade 1; 4.8 cm with low-grade DCIS; margins negative 1/4 lymph nodes positive, ER 100% PR 100% HER-2 negative ratio 1.18 Ki-67 26% and 19%       03/07/2014 Pathologic Stage    Stage IIB (T2 N1)      04/03/2014 - 08/13/2014 Chemotherapy    Adjuvant chemotherapy with dose dense adriamycin and cytoxan followed by weekly abraxane 12      09/09/2014 - 10/17/2014 Radiation Therapy    Adjuvant RT [name]): Left chest wall 50.4 Gy over 28 fractions. Left supraclavicular fossa and axilla: 45 Gy over 25 fractions      10/27/2014 -  Anti-estrogen oral therapy    Anastrozole 1 mg daily plus Ibrance ([name] Clinical Trial)      01/16/2015 Survivorship    Survivorship visit completed and copy of care plan provided to patient.       CHIEF COMPLIANT: Follow-up of low blood counts; Complaining of left shoulder and left wrist pain for the past 3 weeks  INTERVAL HISTORY: Theresa Huff is a 66 year old with above-mentioned history of left breast cancer treated with mastectomy followed by adjuvant chemotherapy and radiation. She participated in [place] clinical trial and was randomized to [facility] with anastrozole. She had to come off the [place] because of low blood counts. She also had fatigue because of the medication. Today she appears to be doing fairly well. Fatigue has improved. She is here to review the  blood work and to determine the future treatment plan. She also also complaining of left shoulder and left wrist pain for the past 3 weeks. Occasionally goes up to 8 out of 10. She thinks it may be bursitis or tendinitis. She has been playing dodge ball recently. She does not think she got hurt during the game.  REVIEW OF SYSTEMS:   Constitutional: Denies fevers, chills or abnormal weight loss Eyes: Denies blurriness of vision Ears, nose, mouth, throat, and face:  Denies mucositis or sore throat Respiratory: Denies cough, dyspnea or wheezes Cardiovascular: Denies palpitation, chest discomfort Gastrointestinal:  Denies nausea, heartburn or change in bowel habits Skin: Denies abnormal skin rashes Lymphatics: Denies new lymphadenopathy or easy bruising Neurological:Denies numbness, tingling or new weaknesses Behavioral/Psych: Mood is stable, no new changes  Extremities: Left shoulder and left wrist tendinitis Breast:  denies any pain or lumps or nodules in either breasts All other systems were reviewed with the patient and are negative.  I have reviewed the past medical history, past surgical history, social history and family history with the patient and they are unchanged from previous note.  ALLERGIES:  is allergic to hydrocodone; codeine; iodine; primidone; radiaplexrx [skin protectants, misc.]; sulfa antibiotics; and tape.  MEDICATIONS:  Current Outpatient Prescriptions  Medication Sig Dispense Refill  . anastrozole (ARIMIDEX) 1 MG tablet TAKE 1 TABLET BY MOUTH EVERY DAY 90 tablet 3  . calcium carbonate (OS-CAL) 600 MG TABS tablet Take 600 mg by mouth daily.    . cetirizine (ZYRTEC) 10 MG tablet Take 10 mg by mouth daily.      . Cholecalciferol (VITAMIN D PO) Take 2,000 Units by mouth daily.    . DULoxetine (CYMBALTA) 30 MG capsule Take 30 mg by mouth daily.     . Eszopiclone (ESZOPICLONE) 3 MG TABS Take 3 mg by mouth at bedtime. Take immediately before bedtime    . Investigational palbociclib (IBRANCE) 75 MG capsule [facility] AFT-05 [name] Take 1 capsule (75 mg total) by mouth daily. Take with food. Swallow whole. Do not chew. Take on days 1-21. Repeat every 28 days. (Patient not taking: Reported on 02/10/2016) 69 capsule 0  . magnesium gluconate (MAGONATE) 500 MG tablet Take 500 mg by mouth once.    . naproxen sodium (ANAPROX) 550 MG tablet Take 550 mg by mouth 2 (two) times daily with a meal.    . omeprazole (PRILOSEC) 40 MG capsule  Take 40 mg by mouth daily.  5  . SUMAtriptan (IMITREX) 50 MG tablet Take 1 tablet (50 mg total) by mouth every 2 (two) hours as needed for migraine. May repeat in 2 hours if headache persists or recurs. 10 tablet 0  . Vitamin D, Ergocalciferol, (DRISDOL) 50000 UNITS CAPS capsule Take 50,000 Units by mouth every 7 (seven) days.     No current facility-administered medications for this visit.     PHYSICAL EXAMINATION: ECOG PERFORMANCE STATUS: 1 - Symptomatic but completely ambulatory  Vitals:   02/16/16 0823  BP: 118/73  Pulse: 71  Resp: 18  Temp: 97.7 F (36.5 C)   Filed Weights   02/16/16 0823  Weight: 152 lb 1.6 oz (69 kg)    GENERAL:alert, no distress and comfortable SKIN: skin color, texture, turgor are normal, no rashes or significant lesions EYES: normal, Conjunctiva are pink and non-injected, sclera clear OROPHARYNX:no exudate, no erythema and lips, buccal mucosa, and tongue normal  NECK: supple, thyroid normal size, non-tender, without nodularity LYMPH:  no palpable lymphadenopathy in the cervical, axillary or inguinal  LUNGS: clear to auscultation and percussion with normal breathing effort HEART: regular rate & rhythm and no murmurs and no lower extremity edema ABDOMEN:abdomen soft, non-tender and normal bowel sounds MUSCULOSKELETAL:no cyanosis of digits and no clubbing  NEURO: alert & oriented x 3 with fluent speech, no focal motor/sensory deficits EXTREMITIES: Left arm sleeve, left shoulder tenderness to palpation.  LABORATORY DATA:  I have reviewed the data as listed   Chemistry      Component Value Date/Time   NA 143 12/31/2015 0924   K 4.7 12/31/2015 0924   CL 101 07/15/2015 1745   CO2 27 12/31/2015 0924   BUN 13.6 12/31/2015 0924   CREATININE 0.8 12/31/2015 0924      Component Value Date/Time   CALCIUM 9.3 12/31/2015 0924   ALKPHOS 61 12/31/2015 0924   AST 12 12/31/2015 0924   ALT 11 12/31/2015 0924   BILITOT 0.66 12/31/2015 0924       Lab  Results  Component Value Date   WBC 5.0 02/16/2016   HGB 13.0 02/16/2016   HCT 39.8 02/16/2016   MCV 102.1 (H) 02/16/2016   PLT 240 02/16/2016   NEUTROABS 2.9 02/16/2016    ASSESSMENT & PLAN:  Breast cancer of upper-outer quadrant of left female breast Left breast invasive ductal carcinoma grade 1; 4.8 cm with low-grade DCIS; margins negative 1/4 lymph nodes positive, ER 100% PR 100% HER-2 negative ratio 1.18 Ki-67 26% and 19% T2 N1 stage IIB status post adjuvant chemotherapy with dose dense Adriamycin and Cytoxan 4 followed by Abraxane weekly 12 Completed Adjuvant radiation therapy 10/17/14  Current Treatment: [name] study: Randomized to Ibrance + Anastrozole Started 10/27/2014; Currently on cycle 14  Toxicities: Neutropenia related to Ibrance: Held treatment 12/23/2014 and subsequently resumed treatment with recovery of blood counts. Ibrance held again for neutropenia and resumed at 100 mg daily from cycle 4. [place] 800 on 08/04/2015. held treatment by protocol, resumed Ibrance 08/18/2015 at 75 mg.ANC was 0.9 and treatment was discontinued based on study protocol  Fatigue related to Ibrance: Improved.  Current Treatment: Anastrozole 1 mg daily Anastrozole Toxicities: 1. Hot flashes mild to moderate 2. tendinitis left shoulder and left wrist: I instructed the patient will stop anastrozole for a week and use hot and cold compresses along with massage, topical pain relievers and ibuprofen as needed. Lens her symptoms get better she can resume it in a week. I also instructed her to change the time of the day that she takes anastrozole from morning to night time. If in spite of that the symptoms recur, we may switch her to letrozole.  RTC in 3 months with labs.  I spent 25 minutes talking to the patient of which more than half was spent in counseling and coordination of care.  No orders of the defined types were placed in this encounter.  The patient has a good understanding of the  overall plan. she agrees with it. she will call with any problems that may develop before the next visit here.   Rulon Eisenmenger, MD 02/16/16

## 2016-02-16 NOTE — Progress Notes (Signed)
[name]: cycle 15 (16 & 17)  Patient here for start of Cycle 15. Patient was taken off of palbociclib at Cycle 14 due to persistent grade 3 neutropenia. Patient here today for labs and MD visit and start of cycle 15 with anti hormone therapy only. Patient returned medication diaries for cycles 12, 13 and 14 as well as study dispensed palbociclib bottles. Cycle 12- patient reports no missed days of palbociclib and anastrozole and study medication bottle returned empty. Cycle 13-  Patient reports no missed days palbociclib and anastrozole and study medication bottle returned empty. Cycle 14- patient stopped palbociclib and only took anastrazole, study medication bottle returned with 21 pills (count verified by PharmD Raul Del). Patient's last reported dosage of palbociclib 75 mg was on December 28, 2015. All three study dispensed bottles were returned to PharmD Raul Del for verification count and documentation of return. Patient confirms to have taken her anastrozole daily without missing a dosage through today. Patient's lab results today show a recovery of WBC and Neutrophils to be within normal levels. Patient reports no new medications or changes to her concomitant medications listed. Patient does report having new pain to left shoulder and left-hand thumb which started approximately three weeks ago and per patient, at worst rates pain an 8 on pain scale of 0-10 (10 being the worst). Patient does not recall having a direct injury to these sites and states that she has been taking her naproxen as needed. Per Dr. Lindi Adie, pain may be related to anastrozole manifesting as tendonitis and advised patient to hold anastrozole x 1 week, starting today, to see if symptoms improve and depending on outcome of whether patient has improvement or not, MD will proceed from there. Patient denies any other side effects, issues or concerns. All patient's questions answered to her satisfaction, patient confirms  understanding to hold anastrozole x 1 week and to call MD with changes. Patient was provided today with drug diaries for anti hormone therapy for cycle 15, 16 and 17. Patient thanked for her continued support of study and was encouraged to call for questions or concerns. Return for C18, 19 and 20 lab/MD  Adele Dan, RN, BSN Clinical Research 02/16/2016 12:14 PM

## 2016-02-20 ENCOUNTER — Telehealth: Payer: Self-pay | Admitting: Medical Oncology

## 2016-02-20 NOTE — Telephone Encounter (Signed)
Patient called to inform me that she has held her Arimidex since Monday, as instructed by Dr. Lindi Adie, to see if the tendonitis to left shoulder and left thumb improve. Patient reports pain has "significantly improved" and was asking if the anti hormone therapy needs to change. I informed patient, per MD notes, that patient should try taking Arimidex in the evening before bed, when she starts up again on Monday 1/29, to see if this helps resolve or aleviate the pain before changing treatment. Patient expressed understanding. Patient informed that I will give Dr. Lindi Adie the update and should he want to make any changes to her current anti hormone therapy at this time, his desk nurse will contact her. Patient expressed thanks and knows to call clinic with further questions/concern. Adele Dan, RN, BSN Clinical Research 02/20/2016 11:23 AM

## 2016-03-31 ENCOUNTER — Encounter: Payer: Self-pay | Admitting: Hematology and Oncology

## 2016-04-13 ENCOUNTER — Telehealth: Payer: Self-pay | Admitting: Hematology and Oncology

## 2016-04-13 NOTE — Telephone Encounter (Signed)
Patient called to rescheduled 4.13.18 appointments. Rescheduled to next available.

## 2016-05-03 ENCOUNTER — Encounter: Payer: Self-pay | Admitting: Hematology and Oncology

## 2016-05-04 ENCOUNTER — Other Ambulatory Visit: Payer: Self-pay | Admitting: Medical Oncology

## 2016-05-04 DIAGNOSIS — Z170 Estrogen receptor positive status [ER+]: Principal | ICD-10-CM

## 2016-05-04 DIAGNOSIS — C50412 Malignant neoplasm of upper-outer quadrant of left female breast: Secondary | ICD-10-CM

## 2016-05-07 ENCOUNTER — Other Ambulatory Visit: Payer: 59

## 2016-05-07 ENCOUNTER — Telehealth: Payer: Self-pay

## 2016-05-07 ENCOUNTER — Ambulatory Visit: Payer: 59 | Admitting: Hematology and Oncology

## 2016-05-07 NOTE — Telephone Encounter (Signed)
05/03/16 late entry: Called pt to respond to her email regarding needing further evaluation and possibly changing anastrozole medication. Pt stopped taking anastrozole 05/02/16 but still dealing with a lot of joint pain that is now affecting her joints and hips. Pt having difficulty with walking and laying down. Pt states that she is on naproxen and applying heating pads on/off. Pt had to reschedule 05/07/16 appt and will be seeing Suzan Garibaldi, NP on 05/10/16. Pt to discuss other options with hormone therapy. Pt verbalized and confirmed time/date of appt.

## 2016-05-10 ENCOUNTER — Other Ambulatory Visit (HOSPITAL_BASED_OUTPATIENT_CLINIC_OR_DEPARTMENT_OTHER): Payer: 59

## 2016-05-10 ENCOUNTER — Ambulatory Visit (HOSPITAL_BASED_OUTPATIENT_CLINIC_OR_DEPARTMENT_OTHER): Payer: 59 | Admitting: Adult Health

## 2016-05-10 ENCOUNTER — Encounter: Payer: Self-pay | Admitting: Medical Oncology

## 2016-05-10 VITALS — BP 131/76 | HR 65 | Temp 98.0°F | Resp 17 | Wt 151.5 lb

## 2016-05-10 DIAGNOSIS — Z9012 Acquired absence of left breast and nipple: Secondary | ICD-10-CM

## 2016-05-10 DIAGNOSIS — Z79811 Long term (current) use of aromatase inhibitors: Secondary | ICD-10-CM | POA: Diagnosis not present

## 2016-05-10 DIAGNOSIS — Z170 Estrogen receptor positive status [ER+]: Principal | ICD-10-CM

## 2016-05-10 DIAGNOSIS — C50412 Malignant neoplasm of upper-outer quadrant of left female breast: Secondary | ICD-10-CM

## 2016-05-10 DIAGNOSIS — Z006 Encounter for examination for normal comparison and control in clinical research program: Secondary | ICD-10-CM

## 2016-05-10 DIAGNOSIS — M7062 Trochanteric bursitis, left hip: Secondary | ICD-10-CM | POA: Diagnosis not present

## 2016-05-10 DIAGNOSIS — M25552 Pain in left hip: Secondary | ICD-10-CM

## 2016-05-10 LAB — COMPREHENSIVE METABOLIC PANEL
ALT: 14 U/L (ref 0–55)
ANION GAP: 9 meq/L (ref 3–11)
AST: 14 U/L (ref 5–34)
Albumin: 3.9 g/dL (ref 3.5–5.0)
Alkaline Phosphatase: 71 U/L (ref 40–150)
BILIRUBIN TOTAL: 0.76 mg/dL (ref 0.20–1.20)
BUN: 15.7 mg/dL (ref 7.0–26.0)
CALCIUM: 9.9 mg/dL (ref 8.4–10.4)
CO2: 27 mEq/L (ref 22–29)
CREATININE: 0.8 mg/dL (ref 0.6–1.1)
Chloride: 108 mEq/L (ref 98–109)
EGFR: 79 mL/min/{1.73_m2} — ABNORMAL LOW (ref >90)
Glucose: 88 mg/dl (ref 70–140)
Potassium: 4.7 mEq/L (ref 3.5–5.1)
Sodium: 144 mEq/L (ref 136–145)
TOTAL PROTEIN: 6.6 g/dL (ref 6.4–8.3)

## 2016-05-10 LAB — CBC WITH DIFFERENTIAL/PLATELET
BASO%: 0.9 % (ref 0.0–2.0)
Basophils Absolute: 0 10*3/uL (ref 0.0–0.1)
EOS%: 5.8 % (ref 0.0–7.0)
Eosinophils Absolute: 0.2 10*3/uL (ref 0.0–0.5)
HEMATOCRIT: 39.6 % (ref 34.8–46.6)
HEMOGLOBIN: 13.5 g/dL (ref 11.6–15.9)
LYMPH#: 1.3 10*3/uL (ref 0.9–3.3)
LYMPH%: 33.9 % (ref 14.0–49.7)
MCH: 31.7 pg (ref 25.1–34.0)
MCHC: 34.1 g/dL (ref 31.5–36.0)
MCV: 93.1 fL (ref 79.5–101.0)
MONO#: 0.5 10*3/uL (ref 0.1–0.9)
MONO%: 13.4 % (ref 0.0–14.0)
NEUT%: 46 % (ref 38.4–76.8)
NEUTROS ABS: 1.7 10*3/uL (ref 1.5–6.5)
PLATELETS: 201 10*3/uL (ref 145–400)
RBC: 4.25 10*6/uL (ref 3.70–5.45)
RDW: 14.1 % (ref 11.2–14.5)
WBC: 3.8 10*3/uL — AB (ref 3.9–10.3)

## 2016-05-10 LAB — HEMOGLOBIN A1C
Est. average glucose Bld gHb Est-mCnc: 103 mg/dL
Hemoglobin A1c: 5.2 % (ref 4.8–5.6)

## 2016-05-10 MED ORDER — OXYCODONE-ACETAMINOPHEN 5-325 MG PO TABS: 1.0000 | ORAL_TABLET | Freq: Four times a day (QID) | ORAL | 0 refills | Status: DC | PRN

## 2016-05-10 NOTE — Progress Notes (Signed)
[name]: cycle 18 (19 & 20) Patient here today in clinic for evaluation for start of Cycle 18. I met with patient prior to her lab appointment to provide patient with PRO's to complete. Study required labs drawn and v/s completed. Patient reports to have forgotten to bring her medication diaries (for anti-hormone documentation) from cycle 15, 16 and 17 today with her. I provided patient with a postage paid envelope for her to place in mail for me. Patient states she has stopped taking her anti hormone treatment of anastrozole on 4/8 (last dosage taken) and has been off of it through today due to having increased pain to left hip that radiates to part of her abdomen and down to her left leg, making it difficult for her to walk at times. Patient reports some improvement to pain when stopping anastrazole, but not much. Patient reports hip pain started approximately 2-3 weeks ago and rates pain as a 7-8/10 and at sometimes a 9/10.  Patient reports using a heat pad and aleve with no relief.  Per NP, Wilber Bihari and with review by MD, patient provided with percocet for pain to left hip and instructed to follow-up with PCP or Orthopaedics for evaluation for cortisone injection, patient gave verbal understanding. Patient continues to report fatigue and insomnia and continues to take Eszopiclone to help with sleep. Patient also reports the occasional hot flash, but no change from baseline. Patient denies any bowel or bladder, headaches, nausea or vomiting issues. Per Dr. Lindi Adie, patient to continuing holding anastrozole and return to clinic in 4 weeks for reassessment and to discuss change to another anti hormone medication.  Patient's anti-hormone therapy on hold for one month till re-evaluation. All patient's questions answered to her statisfaction and patient knows to call clinic with any questions or concerns she may have.  Adele Dan, RN, BSN Clinical Research 05/10/2016 10:38 AM

## 2016-05-10 NOTE — Progress Notes (Signed)
Patient Care Team: Antony Contras, MD as PCP - General (Family Medicine) Theresa Stain, MD (Pulmonary Disease) Rolm Bookbinder, MD as Consulting Physician (General Surgery) Nicholas Lose, MD as Consulting Physician (Hematology and Oncology) Rockwell Germany, RN as Registered Nurse Mauro Kaufmann, RN as Registered Nurse Gardenia Phlegm, NP as Nurse Practitioner (Hematology and Oncology)  DIAGNOSIS:  Encounter Diagnoses  Name Primary?  . Acute hip pain, left Yes  . Malignant neoplasm of upper-outer quadrant of left breast in female, estrogen receptor positive ([name])     SUMMARY OF ONCOLOGIC HISTORY:   Breast cancer of upper-outer quadrant of left female breast ([name])   01/30/2014 Mammogram    Left breast: 2.4 cm irregular high density mass with indistinct margin, middle depth, 6.8 cm from nipple, with architectural distortion. There is also a 3.5 cm irregular high density asymmetry at 1:00, anterior depth, with arch distortion and thickening.      01/30/2014 Breast US    Left breast: 3.7 cm irregular mass with indistinct margin at 2:00, posterior deth, 4 cm from nipple, hypoechoic. 10 x 1 cm irregular mass with lobulated and indistinct margin at 3:00, posterior depth. A third 0.9 cm lobulated mass at 1:00, posterior depth      02/04/2014 Initial Biopsy    Left breast core needle bx x 3: Invasive ductal carcinoma with DCIS ER 100%, PR 100%, HER-2 negative, Ki-67 ranged from 19-26%; third biopsy showed invasive mammary cancer with lobular features and intraductal papilloma, ER/PR positive HER-2 negative      02/11/2014 Breast MRI    Left breast: 3 masses that are confluent measuring 5.6 x 2.7 x 3 cm extending to the left aspect of the areola, appearing to involve the skin of theareola, deviating the nipple towards the left.  No axillary findings.  Neg rightt breast.      02/13/2014 Clinical Stage    Stage IIB: T3 N0      03/07/2014 Definitive Surgery    Left mastectomy/SLNB:  invasive ductal carcinoma grade 1; 4.8 cm with low-grade DCIS; margins negative 1/4 lymph nodes positive, ER 100% PR 100% HER-2 negative ratio 1.18 Ki-67 26% and 19%       03/07/2014 Pathologic Stage    Stage IIB (T2 N1)      04/03/2014 - 08/13/2014 Chemotherapy    Adjuvant chemotherapy with dose dense adriamycin and cytoxan followed by weekly abraxane 12      09/09/2014 - 10/17/2014 Radiation Therapy    Adjuvant RT [name]): Left chest wall 50.4 Gy over 28 fractions. Left supraclavicular fossa and axilla: 45 Gy over 25 fractions      10/27/2014 -  Anti-estrogen oral therapy    Anastrozole 1 mg daily plus Ibrance ([name] Clinical Trial)      01/16/2015 Survivorship    Survivorship visit completed and copy of care plan provided to patient.       CHIEF COMPLIANT: Follow-up of low blood counts; Complaining of left shoulder and left wrist pain for the past 3 weeks  INTERVAL HISTORY: Theresa Huff is a 66 year old with above-mentioned history of left breast cancer treated with mastectomy followed by adjuvant chemotherapy and radiation. She participated in [name] clinical trial and was randomized to [name] with anastrozole. [name] was subsequently discontinued due to neutropenia. She also had fatigue related to [name]. She has had joint aches and pains in the past from anastrozole.  The first episode was In her shoulder and she went off of anastrozole for one week and it improved.  This episode is in her left hip and has been present for about 2-3 weeks.  The pain ranges from 7-8/10.  She feels it right in the outside of her hip and sometimes it will occasionally shoot down her leg.  She is taking naprosyn bid without relief. She has been off of the Anastrozole since 05/02/2016.  Theresa Huff also has left arm lymphedema and wears a sleeve for this.  It is currently well managed and not an issue for her.    REVIEW OF SYSTEMS:   Review of Systems  Constitutional: Negative for chills, fever,  malaise/fatigue and weight loss.  HENT: Negative for hearing loss and tinnitus.   Eyes: Negative for blurred vision and double vision.  Respiratory: Negative for cough and shortness of breath.   Cardiovascular: Negative for chest pain, palpitations and leg swelling.  Gastrointestinal: Negative for abdominal pain, constipation, diarrhea, heartburn, nausea and vomiting.  Musculoskeletal: Negative for back pain.  Skin: Negative for rash.  Neurological: Positive for focal weakness (right hip and leg pain, making it difficult for her to walk. ). Negative for dizziness, seizures, weakness and headaches.   I have reviewed the past medical history, past surgical history, social history and family history with the patient and they are unchanged from previous note.  ALLERGIES:  is allergic to hydrocodone; tramadol; codeine; iodine; primidone; radiaplexrx [skin protectants, misc.]; sulfa antibiotics; and tape.  MEDICATIONS:  Current Outpatient Prescriptions  Medication Sig Dispense Refill  . calcium carbonate (OS-CAL) 600 MG TABS tablet Take 600 mg by mouth daily.    . cetirizine (ZYRTEC) 10 MG tablet Take 10 mg by mouth daily.      . Cholecalciferol (VITAMIN D PO) Take 2,000 Units by mouth daily.    . DULoxetine (CYMBALTA) 30 MG capsule Take 30 mg by mouth daily.     . Eszopiclone (ESZOPICLONE) 3 MG TABS Take 3 mg by mouth at bedtime. Take immediately before bedtime    . fluticasone (FLONASE) 50 MCG/ACT nasal spray     . Investigational palbociclib (IBRANCE) 75 MG capsule [facility] AFT-05 [name] Take 1 capsule (75 mg total) by mouth daily. Take with food. Swallow whole. Do not chew. Take on days 1-21. Repeat every 28 days. 69 capsule 0  . magnesium gluconate (MAGONATE) 500 MG tablet Take 500 mg by mouth once.    . naproxen sodium (ANAPROX) 550 MG tablet Take 550 mg by mouth 2 (two) times daily with a meal.    . omeprazole (PRILOSEC) 40 MG capsule Take 40 mg by mouth daily.  5  .  SUMAtriptan (IMITREX) 50 MG tablet Take 1 tablet (50 mg total) by mouth every 2 (two) hours as needed for migraine. May repeat in 2 hours if headache persists or recurs. 10 tablet 0  . Vitamin D, Ergocalciferol, (DRISDOL) 50000 UNITS CAPS capsule Take 50,000 Units by mouth every 7 (seven) days.    Marland Kitchen amoxicillin (AMOXIL) 500 MG capsule     . anastrozole (ARIMIDEX) 1 MG tablet TAKE 1 TABLET BY MOUTH EVERY DAY (Patient not taking: Reported on 05/10/2016) 90 tablet 3  . oxyCODONE-acetaminophen (PERCOCET/ROXICET) 5-325 MG tablet Take 1 tablet by mouth every 6 (six) hours as needed for severe pain. 20 tablet 0   No current facility-administered medications for this visit.     PHYSICAL EXAMINATION: ECOG PERFORMANCE STATUS: 1 - Symptomatic but completely ambulatory  Vitals:   05/10/16 0900  BP: 131/76  Pulse: 65  Resp: 17  Temp: 98 F (36.7 C)   Filed  Weights   05/10/16 0900  Weight: 151 lb 8 oz (68.7 kg)    GENERAL: Patient is a well appearing female in no acute distress HEENT:  Sclerae anicteric. PERRL Oropharynx clear and moist. No ulcerations or evidence of oropharyngeal candidiasis. Neck is supple.  NODES:  No cervical, supraclavicular, or axillary lymphadenopathy palpated.  BREAST EXAM:  Right breast without nodules, masses, skin or nipple changes, left mastectomy s/p radiation with slight hyperpigmentation, no nodularity, no skin changes, no sign of recurrence.  Benign bilateral breast exam. LUNGS:  Clear to auscultation bilaterally.  No wheezes or rhonchi. HEART:  Regular rate and rhythm. No murmur appreciated. ABDOMEN:  Soft, nontender.  Positive, normoactive bowel sounds. No organomegaly palpated. MSK:  No focal spinal tenderness to palpation. Full range of motion bilaterally in the upper extremities. +tenderness to left greater trochanter with left knee flexed and leg internally rotated.   EXTREMITIES:  No peripheral edema.   SKIN:  Clear with no obvious rashes or skin changes.    NEURO:  Nonfocal. Well oriented.  Appropriate affect.   LABORATORY DATA:  I have reviewed the data as listed   Chemistry      Component Value Date/Time   NA 144 05/10/2016 0840   K 4.7 05/10/2016 0840   CL 101 07/15/2015 1745   CO2 27 05/10/2016 0840   BUN 15.7 05/10/2016 0840   CREATININE 0.8 05/10/2016 0840      Component Value Date/Time   CALCIUM 9.9 05/10/2016 0840   ALKPHOS 71 05/10/2016 0840   AST 14 05/10/2016 0840   ALT 14 05/10/2016 0840   BILITOT 0.76 05/10/2016 0840       Lab Results  Component Value Date   WBC 3.8 (L) 05/10/2016   HGB 13.5 05/10/2016   HCT 39.6 05/10/2016   MCV 93.1 05/10/2016   PLT 201 05/10/2016   NEUTROABS 1.7 05/10/2016    ASSESSMENT & PLAN:  Breast cancer of upper-outer quadrant of left female breast Left breast invasive ductal carcinoma grade 1; 4.8 cm with low-grade DCIS; margins negative 1/4 lymph nodes positive, ER 100% PR 100% HER-2 negative ratio 1.18 Ki-67 26% and 19% T2 N1 stage IIB status post adjuvant chemotherapy with dose dense Adriamycin and Cytoxan 4 followed by Abraxane weekly 12 Completed Adjuvant radiation therapy 10/17/14  Current Treatment: [name] study: Randomized to Ibrance + Anastrozole Started 10/27/2014  Toxicities: Neutropenia related to Ibrance: Held treatment 12/23/2014 and subsequently resumed treatment with recovery of blood counts. Ibrance held again for neutropenia and resumed at 100 mg daily from cycle 4. [place] 800 on 08/04/2015. held treatment by protocol, resumed Ibrance 08/18/2015 at 75 mg.ANC was 0.9 and treatment was discontinued based on study protocol   Current Treatment: Anastrozole 1 mg daily (currently on hold) Anastrozole Toxicities: 1. Hot flashes mild to moderate 2. Left greater trochanteric bursitis: patient to continue naprosyn bid, will add percocet #20--patient instructed on bowel regimen and not to take and drive, recommended she see either her pcp or ortho and be evaluated for  cortisone injection.    Patient to continue off of Anastrozole x 4 weeks.  We will see her back in clinic at that time to discuss switching to a different anti-estrogen therapy.     I spent 25 minutes talking to the patient of which more than half was spent in counseling and coordination of care.   The patient has a good understanding of the overall plan. she agrees with it. she will call with any problems that may develop  before the next visit here.   Gardenia Phlegm, NP 05/10/16  Attending Note  I personally saw and examined Theresa Huff. The plan of care was discussed with her. I agree with the assessment and plan as documented above. Ms. Reily is having severe pain in the left hip region. We will refer her to orthopedics. For the pain, we gave her prescription for Percocets for a short-term basis. We will keep her away from antiestrogen therapy for another 4 weeks. If her symptoms improve by then we will switch her to letrozole.  Signed Rulon Eisenmenger, MD

## 2016-05-17 ENCOUNTER — Other Ambulatory Visit: Payer: 59

## 2016-05-17 ENCOUNTER — Ambulatory Visit: Payer: 59 | Admitting: Hematology and Oncology

## 2016-06-03 ENCOUNTER — Encounter: Payer: Self-pay | Admitting: Hematology and Oncology

## 2016-06-04 ENCOUNTER — Other Ambulatory Visit: Payer: Self-pay | Admitting: Medical Oncology

## 2016-06-04 ENCOUNTER — Other Ambulatory Visit: Payer: Self-pay | Admitting: Family Medicine

## 2016-06-04 ENCOUNTER — Telehealth: Payer: Self-pay | Admitting: Medical Oncology

## 2016-06-04 DIAGNOSIS — M5432 Sciatica, left side: Secondary | ICD-10-CM

## 2016-06-04 DIAGNOSIS — C50412 Malignant neoplasm of upper-outer quadrant of left female breast: Secondary | ICD-10-CM

## 2016-06-04 DIAGNOSIS — Z170 Estrogen receptor positive status [ER+]: Principal | ICD-10-CM

## 2016-06-04 NOTE — Telephone Encounter (Signed)
[name] LVMOM with patient asking her to bring her medication diaries from cycles 15, 16 and 17 when here for appointment on Monday 5/14. Patient to call with questions.

## 2016-06-05 NOTE — Assessment & Plan Note (Deleted)
Left breast invasive ductal carcinoma grade 1; 4.8 cm with low-grade DCIS; margins negative 1/4 lymph nodes positive, ER 100% PR 100% HER-2 negative ratio 1.18 Ki-67 26% and 19% T2 N1 stage IIB status post adjuvant chemotherapy with dose dense Adriamycin and Cytoxan 4 followed by Abraxane weekly 12 Completed Adjuvant radiation therapy 10/17/14  Current Treatment: [name] study: Randomized to Ibrance + Anastrozole Started 10/27/2014; Currently on cycle 14  Toxicities: Neutropenia related to Ibrance: Held treatment 12/23/2014 and subsequently resumed treatment with recovery of blood counts. Ibrance held again for neutropenia and resumed at 100 mg daily from cycle 4. ANC 800 on 08/04/2015. held treatment by protocol, resumed Ibrance 08/18/2015 at 75 mg.ANC was 0.9 and treatment was discontinued based on study protocol  Fatigue related to Ibrance: Improved.  Current Treatment: Anastrozole 1 mg daily Anastrozole Toxicities: 1. Hot flashes mild to moderate 2. tendinitis left shoulder and left wrist  RTC in 3 months with labs. 

## 2016-06-06 ENCOUNTER — Ambulatory Visit
Admission: RE | Admit: 2016-06-06 | Discharge: 2016-06-06 | Disposition: A | Discharge status: Home / Self Care | Payer: 59 | Source: Ambulatory Visit | Attending: Family Medicine | Admitting: Family Medicine

## 2016-06-06 DIAGNOSIS — M5432 Sciatica, left side: Secondary | ICD-10-CM

## 2016-06-07 ENCOUNTER — Ambulatory Visit: Payer: 59 | Admitting: Hematology and Oncology

## 2016-06-07 ENCOUNTER — Other Ambulatory Visit: Payer: 59

## 2016-06-08 ENCOUNTER — Telehealth: Payer: Self-pay | Admitting: Medical Oncology

## 2016-06-08 NOTE — Telephone Encounter (Signed)
[name] Spoke with patient regarding her rescheduling her appointment with Dr. Lindi Adie. Patient was scheduled to return on 5/14 for reassessment and she rescheduled to 06/11/16, lab and MD visit. Informed patient that due to the length in holding of anti hormone treatment, per study, she will move into the early termination, EOT follow-up phase and there are study specific labs to collect in which we cannot send out on Friday, per the study protocol. I inquired with patient if she can possibly come in for labs on 17th, patient informed me that she is having a laminectomy the afternoon of the 16th and asked if she could come in at 10:30am on the 16th. I informed patient that I will have the lab scheduled and thanked her for taking the time to complete the lab requirement. All patient's questions answered to her satisfaction and patient encouraged to call clinic with any questions or concerns. Appointment place and patient aware of new time. Adele Dan, RN, BSN Clinical Research 06/08/2016 3:03 PM

## 2016-06-09 ENCOUNTER — Ambulatory Visit: Payer: 59 | Admitting: Hematology and Oncology

## 2016-06-09 ENCOUNTER — Other Ambulatory Visit: Payer: 59

## 2016-06-09 ENCOUNTER — Other Ambulatory Visit (HOSPITAL_BASED_OUTPATIENT_CLINIC_OR_DEPARTMENT_OTHER): Payer: 59

## 2016-06-09 DIAGNOSIS — Z170 Estrogen receptor positive status [ER+]: Principal | ICD-10-CM

## 2016-06-09 DIAGNOSIS — C50412 Malignant neoplasm of upper-outer quadrant of left female breast: Secondary | ICD-10-CM

## 2016-06-09 LAB — RESEARCH LABS

## 2016-06-09 LAB — CBC WITH DIFFERENTIAL/PLATELET
BASO%: 0.6 % (ref 0.0–2.0)
Basophils Absolute: 0.1 10*3/uL (ref 0.0–0.1)
EOS ABS: 0 10*3/uL (ref 0.0–0.5)
EOS%: 0.2 % (ref 0.0–7.0)
HCT: 47.3 % — ABNORMAL HIGH (ref 34.8–46.6)
HEMOGLOBIN: 15.7 g/dL (ref 11.6–15.9)
LYMPH#: 4.2 10*3/uL — AB (ref 0.9–3.3)
LYMPH%: 40.9 % (ref 14.0–49.7)
MCH: 30.8 pg (ref 25.1–34.0)
MCHC: 33.3 g/dL (ref 31.5–36.0)
MCV: 92.7 fL (ref 79.5–101.0)
MONO#: 0.8 10*3/uL (ref 0.1–0.9)
MONO%: 7.4 % (ref 0.0–14.0)
NEUT%: 50.9 % (ref 38.4–76.8)
NEUTROS ABS: 5.3 10*3/uL (ref 1.5–6.5)
Platelets: 255 10*3/uL (ref 145–400)
RBC: 5.1 10*6/uL (ref 3.70–5.45)
RDW: 14.2 % (ref 11.2–14.5)
WBC: 10.4 10*3/uL — AB (ref 3.9–10.3)

## 2016-06-09 LAB — COMPREHENSIVE METABOLIC PANEL
ALT: 19 U/L (ref 0–55)
AST: 13 U/L (ref 5–34)
Albumin: 4.3 g/dL (ref 3.5–5.0)
Alkaline Phosphatase: 85 U/L (ref 40–150)
Anion Gap: 12 mEq/L — ABNORMAL HIGH (ref 3–11)
BILIRUBIN TOTAL: 1.33 mg/dL — AB (ref 0.20–1.20)
BUN: 20.7 mg/dL (ref 7.0–26.0)
CO2: 26 mEq/L (ref 22–29)
Calcium: 10.6 mg/dL — ABNORMAL HIGH (ref 8.4–10.4)
Chloride: 103 mEq/L (ref 98–109)
Creatinine: 0.9 mg/dL (ref 0.6–1.1)
EGFR: 67 mL/min/{1.73_m2} — AB (ref >90)
GLUCOSE: 99 mg/dL (ref 70–140)
Potassium: 4.4 mEq/L (ref 3.5–5.1)
SODIUM: 141 meq/L (ref 136–145)
TOTAL PROTEIN: 7.7 g/dL (ref 6.4–8.3)

## 2016-06-10 ENCOUNTER — Other Ambulatory Visit: Payer: Self-pay | Admitting: Medical Oncology

## 2016-06-10 LAB — HEMOGLOBIN A1C
Est. average glucose Bld gHb Est-mCnc: 111 mg/dL
HEMOGLOBIN A1C: 5.5 % (ref 4.8–5.6)

## 2016-06-10 NOTE — Assessment & Plan Note (Signed)
Left breast invasive ductal carcinoma grade 1; 4.8 cm with low-grade DCIS; margins negative 1/4 lymph nodes positive, ER 100% PR 100% HER-2 negative ratio 1.18 Ki-67 26% and 19% T2 N1 stage IIB status post adjuvant chemotherapy with dose dense Adriamycin and Cytoxan 4 followed by Abraxane weekly 12 Completed Adjuvant radiation therapy 10/17/14  Current Treatment: [name] study: Randomized to Ibrance + Anastrozole Started 10/27/2014; Currently on cycle 14  Toxicities: Neutropenia related to Ibrance: Held treatment 12/23/2014 and subsequently resumed treatment with recovery of blood counts. Ibrance held again for neutropenia and resumed at 100 mg daily from cycle 4. ANC 800 on 08/04/2015. held treatment by protocol, resumed Ibrance 08/18/2015 at 75 mg.ANC was 0.9 and treatment was discontinued based on study protocol  Fatigue related to Ibrance: Improved.  Current Treatment: Anastrozole 1 mg daily Anastrozole Toxicities: 1. Hot flashes mild to moderate 2. tendinitis left shoulder and left wrist  RTC in 3 months with labs. 

## 2016-06-11 ENCOUNTER — Encounter: Payer: Self-pay | Admitting: Hematology and Oncology

## 2016-06-11 ENCOUNTER — Ambulatory Visit (HOSPITAL_BASED_OUTPATIENT_CLINIC_OR_DEPARTMENT_OTHER): Payer: 59 | Admitting: Hematology and Oncology

## 2016-06-11 ENCOUNTER — Other Ambulatory Visit: Payer: 59

## 2016-06-11 ENCOUNTER — Encounter: Payer: Self-pay | Admitting: Medical Oncology

## 2016-06-11 DIAGNOSIS — C50412 Malignant neoplasm of upper-outer quadrant of left female breast: Secondary | ICD-10-CM

## 2016-06-11 DIAGNOSIS — Z79811 Long term (current) use of aromatase inhibitors: Secondary | ICD-10-CM | POA: Diagnosis not present

## 2016-06-11 DIAGNOSIS — D701 Agranulocytosis secondary to cancer chemotherapy: Secondary | ICD-10-CM

## 2016-06-11 DIAGNOSIS — Z006 Encounter for examination for normal comparison and control in clinical research program: Secondary | ICD-10-CM

## 2016-06-11 DIAGNOSIS — Z170 Estrogen receptor positive status [ER+]: Principal | ICD-10-CM

## 2016-06-11 DIAGNOSIS — Z9012 Acquired absence of left breast and nipple: Secondary | ICD-10-CM

## 2016-06-11 MED ORDER — CYCLOBENZAPRINE HCL 7.5 MG PO TABS: 7.5000 mg | ORAL_TABLET | Freq: Three times a day (TID) | ORAL | 0 refills | Status: DC | PRN

## 2016-06-11 NOTE — Progress Notes (Signed)
[name]: End of Treatment/Early Termination I met with patient this morning for her reassessment visit with Dr. Lindi Adie regarding her anastrozole hold from last visit. Patient's last reported dosage was 4/8th due to having side effects that were possibly related to the anastrozole. MD had patient hold anti-hormone for another month and patient returned today for reassessment of AE's reported at her office visit on 4/16. At that time patient reported to be having left hip pain that was radiating across her abdomen and down her left leg, as well as some ongoing tendonitis to left shoulder and thumb. Patient had a lumbar/spine MRI on 5/13 that resulted with "very large extruded disc fragment on the left at L3-4 with upgoing disc material compression left L3 and L4 nerve roots." Patient was scheduled for a laminectomy procedure, completed 5/16. Today patient reports resolution to left radiating abdominal and hip pain, other than some surgical pain. Patient also reports resolution to the tendonitis to left shoulder and left thumb approximately 2 weeks after stopping her anastrozole on 4/8. Patient reports no other AE's at this time. Concomitant medications reviewed with patient and updated. Patient was provided with a prednisone tapering pack of 20 mg for her back by, in which she reports she started on 5/10 and completed 5/17. Also was provided with percocet /roxicet 5-325 mg for her back pain on 4/16 to take as needed and cyclobenzaprine 7.5 mg as needed for muscle spasms.  Per Dr. Lindi Adie, patient to hold anastrozole for 1 more month and to resume on 07/09/16 and to return to clinic in 3 months. I spoke with patient today regarding the early termination part of study due to anastrozole hold of greater than 6 weeks and the rational behind it and patient gave verbal understanding. Patient agreed to continue to be followed throughout the study and knows that the next study related appointment would be a phone call from me  in 6 months. I thanked patient for her continued support of study and encouraged her to call Dr. Lindi Adie or myself with any questions or concerns she may have.  Patient completed her antihormone medication diaries for Cycle 15, 16 and 17 and those were collected today, documenting the start date of January 29th for Cycle 15 through Cycle 17th of last taken dose on April 8th when patient reports stopping due to the above AE's. Start date reflects a one week hold as instructed by Dr. Lindi Adie during patient's visit on February 16, 2016.  Patient's study required labs completed on 5/16 due to lab shipments to study cannot be sent on Fridays as well as working with patient's schedule of availability.   CLOTILDA HAFER [id number]  06/11/2016  Adverse Event Log  Study/Protocol: [name] Cycle: 15,16,17  Event Grade Onset Date Resolved Date Palbociclib Anastrozole Neither Treatment Comments  Lymphedema left axilla Gr 1 ~ beginning of 10/2015 ongoing No No Yes Physical Therapy   Insomnia- worsening Gr 3 ~ end of Oct. 2017 ongoing No No Yes conmed   Concentration impairment Gr 1 ~ end of Oct. 2017 ongoing No No Yes None   Fatigue Gr 1 ~ Jan. 2018 ongoing Yes Yes No None   Tendonitis-left shoulder, left thumb Gr 2 ~ 01/26/2016 02/20/2016 No Yes No Anastrozole held x 7 days   Tendonitis-left shoulder, left thumb Gr. 1 02/20/16 ~05/16/16 No Yes No    Left hip pain-trochnateric bursitis. SEE: Musculoskeletal Gr 2 ~ 04/18/16 06/09/16 No Yes No Laminectomy  06/09/2016 06/06/16 MRI resulted with lumbar disc fragment and  disc compression  Leukopenia Gr 1 05/10/16 06/09/16 Yes No No None   Lypmphocyte increased Gr 2 06/09/16 ongoing Yes No No None   Investigations- other: WBC increased Gr 1 06/09/16 ongoing No No Yes None   T. Bilirubin increased  Gr 1 06/09/16 ongoing No No Yes None   Hypercalcemia Gr 1 06/09/16 ogoing No No Yes None   Hypotension Gr 1 06/11/16 ongoing No No Yes None   Musculoskeletal- other: lumbar disc  fragment & disc compression Gr 2 ~04/18/16 06/09/16 No Yes No Laminectomy 06/09/2016 Anastrozole on hold till 07/09/16  Spasticity- to her left back Gr 1 06/09/16 ongoing No No Yes conmed   Pain- mild (to laminectomy site) Gr 1 06/09/16 ongoing No No Yes conmed     Adele Dan, RN, BSN Clinical Research 06/11/2016 11:27 AM

## 2016-06-11 NOTE — Progress Notes (Signed)
Patient Care Team: Antony Contras, MD as PCP - General (Family Medicine) Elsie Stain, MD (Pulmonary Disease) Rolm Bookbinder, MD as Consulting Physician (General Surgery) Nicholas Lose, MD as Consulting Physician (Hematology and Oncology) Rockwell Germany, RN as Registered Nurse Mauro Kaufmann, RN as Registered Nurse Causey, Charlestine Massed, NP as Nurse Practitioner (Hematology and Oncology)  DIAGNOSIS:  Encounter Diagnosis  Name Primary?  . Malignant neoplasm of upper-outer quadrant of left breast in female, estrogen receptor positive ([place])     SUMMARY OF ONCOLOGIC HISTORY:   Breast cancer of upper-outer quadrant of left female breast ([place])   01/30/2014 Mammogram    Left breast: 2.4 cm irregular high density mass with indistinct margin, middle depth, 6.8 cm from nipple, with architectural distortion. There is also a 3.5 cm irregular high density asymmetry at 1:00, anterior depth, with arch distortion and thickening.      01/30/2014 Breast US    Left breast: 3.7 cm irregular mass with indistinct margin at 2:00, posterior deth, 4 cm from nipple, hypoechoic. 10 x 1 cm irregular mass with lobulated and indistinct margin at 3:00, posterior depth. A third 0.9 cm lobulated mass at 1:00, posterior depth      02/04/2014 Initial Biopsy    Left breast core needle bx x 3: Invasive ductal carcinoma with DCIS ER 100%, PR 100%, HER-2 negative, Ki-67 ranged from 19-26%; third biopsy showed invasive mammary cancer with lobular features and intraductal papilloma, ER/PR positive HER-2 negative      02/11/2014 Breast MRI    Left breast: 3 masses that are confluent measuring 5.6 x 2.7 x 3 cm extending to the left aspect of the areola, appearing to involve the skin of theareola, deviating the nipple towards the left.  No axillary findings.  Neg rightt breast.      02/13/2014 Clinical Stage    Stage IIB: T3 N0      03/07/2014 Definitive Surgery    Left mastectomy/SLNB: invasive ductal  carcinoma grade 1; 4.8 cm with low-grade DCIS; margins negative 1/4 lymph nodes positive, ER 100% PR 100% HER-2 negative ratio 1.18 Ki-67 26% and 19%       03/07/2014 Pathologic Stage    Stage IIB (T2 N1)      04/03/2014 - 08/13/2014 Chemotherapy    Adjuvant chemotherapy with dose dense adriamycin and cytoxan followed by weekly abraxane 12      09/09/2014 - 10/17/2014 Radiation Therapy    Adjuvant RT [name]): Left chest wall 50.4 Gy over 28 fractions. Left supraclavicular fossa and axilla: 45 Gy over 25 fractions      10/27/2014 -  Anti-estrogen oral therapy    Anastrozole 1 mg daily plus Ibrance ([name] Clinical Trial)      01/16/2015 Survivorship    Survivorship visit completed and copy of care plan provided to patient.       CHIEF COMPLIANT: Follow-up after discontinuation of Palbociclib on clinical trial [name]  INTERVAL HISTORY: Theresa Huff is a 66 year old with above-mentioned history of stage IIB breast cancer who went on clinical trial [name] and received anastrozole along with Palbociclib. We stopped her anti hormone treatment of anastrozole on 4/8 due to having increased pain to left hip that radiates to part of her abdomen and down to her left leg, making it difficult for her to walk. Patient reports some improvement to pain when stopping anastrozole. She then had an MRI of the spine which revealed the that she had a disc that was putting pressure on the nerves. On  06/09/2016, patient underwent a laminectomy procedure. She is recovering from that and has only been 2 days since the surgery. She is able to walk without any pain or discomfort. Apart from surgical pain and some muscle spasm she is doing quite well today.  REVIEW OF SYSTEMS:   Constitutional: Denies fevers, chills or abnormal weight loss , pain related to recent surgery on the back Eyes: Denies blurriness of vision Ears, nose, mouth, throat, and face: Denies mucositis or sore throat Respiratory: Denies cough,  dyspnea or wheezes Cardiovascular: Denies palpitation, chest discomfort Gastrointestinal:  Denies nausea, heartburn or change in bowel habits Skin: Denies abnormal skin rashes Lymphatics: Denies new lymphadenopathy or easy bruising Neurological:Denies numbness, tingling or new weaknesses Behavioral/Psych: Mood is stable, no new changes  Extremities: No lower extremity edema Breast:  denies any pain or lumps or nodules in either breasts All other systems were reviewed with the patient and are negative.  I have reviewed the past medical history, past surgical history, social history and family history with the patient and they are unchanged from previous note.  ALLERGIES:  is allergic to hydrocodone; tramadol; codeine; iodine; primidone; radiaplexrx [skin protectants, misc.]; sulfa antibiotics; and tape.  MEDICATIONS:  Current Outpatient Prescriptions  Medication Sig Dispense Refill  . anastrozole (ARIMIDEX) 1 MG tablet TAKE 1 TABLET BY MOUTH EVERY DAY (Patient not taking: Reported on 05/10/2016) 90 tablet 3  . calcium carbonate (OS-CAL) 600 MG TABS tablet Take 600 mg by mouth daily.    . cetirizine (ZYRTEC) 10 MG tablet Take 10 mg by mouth daily.      . Cholecalciferol (VITAMIN D PO) Take 2,000 Units by mouth daily.    . cyclobenzaprine (FEXMID) 7.5 MG tablet Take 1 tablet (7.5 mg total) by mouth 3 (three) times daily as needed for muscle spasms. 30 tablet 0  . DULoxetine (CYMBALTA) 30 MG capsule Take 30 mg by mouth daily.     . Eszopiclone (ESZOPICLONE) 3 MG TABS Take 3 mg by mouth at bedtime. Take immediately before bedtime    . fluticasone (FLONASE) 50 MCG/ACT nasal spray     . magnesium gluconate (MAGONATE) 500 MG tablet Take 500 mg by mouth once.    Marland Kitchen omeprazole (PRILOSEC) 40 MG capsule Take 40 mg by mouth daily.  5  . oxyCODONE-acetaminophen (PERCOCET/ROXICET) 5-325 MG tablet Take 1 tablet by mouth every 6 (six) hours as needed for severe pain. 20 tablet 0  . SUMAtriptan (IMITREX) 50  MG tablet Take 1 tablet (50 mg total) by mouth every 2 (two) hours as needed for migraine. May repeat in 2 hours if headache persists or recurs. 10 tablet 0  . Vitamin D, Ergocalciferol, (DRISDOL) 50000 UNITS CAPS capsule Take 50,000 Units by mouth every 7 (seven) days.     No current facility-administered medications for this visit.     PHYSICAL EXAMINATION: ECOG PERFORMANCE STATUS: 1 - Symptomatic but completely ambulatory  Vitals:   06/11/16 0805  BP: 97/69  Pulse: 87  Resp: 20  Temp: 98.2 F (36.8 C)   Filed Weights   06/11/16 0805  Weight: 150 lb 3.2 oz (68.1 kg)    GENERAL:alert, no distress and comfortable SKIN: skin color, texture, turgor are normal, no rashes or significant lesions EYES: normal, Conjunctiva are pink and non-injected, sclera clear OROPHARYNX:no exudate, no erythema and lips, buccal mucosa, and tongue normal  NECK: supple, thyroid normal size, non-tender, without nodularity LYMPH:  no palpable lymphadenopathy in the cervical, axillary or inguinal LUNGS: clear to auscultation and percussion  with normal breathing effort HEART: regular rate & rhythm and no murmurs and no lower extremity edema ABDOMEN:abdomen soft, non-tender and normal bowel sounds MUSCULOSKELETAL:no cyanosis of digits and no clubbing  NEURO: alert & oriented x 3 with fluent speech, no focal motor/sensory deficits EXTREMITIES: No lower extremity edema  LABORATORY DATA:  I have reviewed the data as listed   Chemistry      Component Value Date/Time   NA 141 06/09/2016 1043   K 4.4 06/09/2016 1043   CL 101 07/15/2015 1745   CO2 26 06/09/2016 1043   BUN 20.7 06/09/2016 1043   CREATININE 0.9 06/09/2016 1043      Component Value Date/Time   CALCIUM 10.6 (H) 06/09/2016 1043   ALKPHOS 85 06/09/2016 1043   AST 13 06/09/2016 1043   ALT 19 06/09/2016 1043   BILITOT 1.33 (H) 06/09/2016 1043       Lab Results  Component Value Date   WBC 10.4 (H) 06/09/2016   HGB 15.7 06/09/2016    HCT 47.3 (H) 06/09/2016   MCV 92.7 06/09/2016   PLT 255 06/09/2016   NEUTROABS 5.3 06/09/2016    ASSESSMENT & PLAN:  Breast cancer of upper-outer quadrant of left female breast Left breast invasive ductal carcinoma grade 1; 4.8 cm with low-grade DCIS; margins negative 1/4 lymph nodes positive, ER 100% PR 100% HER-2 negative ratio 1.18 Ki-67 26% and 19% T2 N1 stage IIB status post adjuvant chemotherapy with dose dense Adriamycin and Cytoxan 4 followed by Abraxane weekly 12 Completed Adjuvant radiation therapy 10/17/14  Current Treatment: [name] study: Randomized to Ibrance + Anastrozole Started 10/27/2014;Discontinued Palbociclib for toxicities Today is EOT from clinical trial.  Toxicities: Neutropenia related to Ibrance: In spite of those adjustments, patient continued to have neutropenia and based on the study protocol, be discontinued Palbociclib  Anastrozole was held 04/30/16 (for back pain and leg pain issues related to disc)and will be resumed 07/09/2016  Current Treatment: Anastrozole 1 mg daily to be resumed 07/09/2016 Disc laminectomy 06/09/2016: Postoperatively patient is doing quite well. Pain in the back has markedly subsided and she is able to walk without any major limitations. I recommended that the patient resume anastrozole therapy 07/09/2016.   RTC in 3 months with labs.  I spent 25 minutes talking to the patient of which more than half was spent in counseling and coordination of care.  No orders of the defined types were placed in this encounter.  The patient has a good understanding of the overall plan. she agrees with it. she will call with any problems that may develop before the next visit here.   Rulon Eisenmenger, MD 06/11/16

## 2016-08-31 ENCOUNTER — Other Ambulatory Visit: Payer: Self-pay | Admitting: Hematology and Oncology

## 2016-08-31 DIAGNOSIS — C50412 Malignant neoplasm of upper-outer quadrant of left female breast: Secondary | ICD-10-CM

## 2016-09-13 ENCOUNTER — Ambulatory Visit (HOSPITAL_BASED_OUTPATIENT_CLINIC_OR_DEPARTMENT_OTHER): Payer: 59 | Admitting: Hematology and Oncology

## 2016-09-13 ENCOUNTER — Other Ambulatory Visit: Payer: Self-pay | Admitting: Hematology

## 2016-09-13 ENCOUNTER — Other Ambulatory Visit (HOSPITAL_BASED_OUTPATIENT_CLINIC_OR_DEPARTMENT_OTHER): Payer: 59

## 2016-09-13 ENCOUNTER — Encounter: Payer: Self-pay | Admitting: Hematology and Oncology

## 2016-09-13 DIAGNOSIS — C50412 Malignant neoplasm of upper-outer quadrant of left female breast: Secondary | ICD-10-CM

## 2016-09-13 DIAGNOSIS — Z170 Estrogen receptor positive status [ER+]: Principal | ICD-10-CM

## 2016-09-13 DIAGNOSIS — C779 Secondary and unspecified malignant neoplasm of lymph node, unspecified: Secondary | ICD-10-CM

## 2016-09-13 DIAGNOSIS — M549 Dorsalgia, unspecified: Secondary | ICD-10-CM | POA: Diagnosis not present

## 2016-09-13 LAB — CBC WITH DIFFERENTIAL/PLATELET
BASO%: 0.8 % (ref 0.0–2.0)
BASOS ABS: 0 10*3/uL (ref 0.0–0.1)
EOS ABS: 0.2 10*3/uL (ref 0.0–0.5)
EOS%: 5.4 % (ref 0.0–7.0)
HCT: 40.9 % (ref 34.8–46.6)
HGB: 12.9 g/dL (ref 11.6–15.9)
LYMPH#: 1.5 10*3/uL (ref 0.9–3.3)
LYMPH%: 37.6 % (ref 14.0–49.7)
MCH: 29.8 pg (ref 25.1–34.0)
MCHC: 31.5 g/dL (ref 31.5–36.0)
MCV: 94.5 fL (ref 79.5–101.0)
MONO#: 0.4 10*3/uL (ref 0.1–0.9)
MONO%: 9.7 % (ref 0.0–14.0)
NEUT%: 46.5 % (ref 38.4–76.8)
NEUTROS ABS: 1.8 10*3/uL (ref 1.5–6.5)
PLATELETS: 210 10*3/uL (ref 145–400)
RBC: 4.33 10*6/uL (ref 3.70–5.45)
RDW: 13.5 % (ref 11.2–14.5)
WBC: 3.9 10*3/uL (ref 3.9–10.3)

## 2016-09-13 LAB — COMPREHENSIVE METABOLIC PANEL
ALBUMIN: 3.7 g/dL (ref 3.5–5.0)
ALK PHOS: 77 U/L (ref 40–150)
ALT: 16 U/L (ref 0–55)
ANION GAP: 6 meq/L (ref 3–11)
AST: 14 U/L (ref 5–34)
BUN: 15.4 mg/dL (ref 7.0–26.0)
CO2: 26 mEq/L (ref 22–29)
Calcium: 9.6 mg/dL (ref 8.4–10.4)
Chloride: 107 mEq/L (ref 98–109)
Creatinine: 0.9 mg/dL (ref 0.6–1.1)
EGFR: 73 mL/min/{1.73_m2} — AB (ref >90)
Glucose: 115 mg/dl (ref 70–140)
POTASSIUM: 5 meq/L (ref 3.5–5.1)
Sodium: 139 mEq/L (ref 136–145)
TOTAL PROTEIN: 6.4 g/dL (ref 6.4–8.3)
Total Bilirubin: 0.57 mg/dL (ref 0.20–1.20)

## 2016-09-13 MED ORDER — LETROZOLE 2.5 MG PO TABS: 2.5000 mg | ORAL_TABLET | Freq: Every day | ORAL | 0 refills | Status: DC

## 2016-09-13 NOTE — Progress Notes (Signed)
Patient Care Team: Antony Contras, MD as PCP - General (Family Medicine) Elsie Stain, MD (Pulmonary Disease) Rolm Bookbinder, MD as Consulting Physician (General Surgery) Nicholas Lose, MD as Consulting Physician (Hematology and Oncology) Rockwell Germany, RN as Registered Nurse Mauro Kaufmann, RN as Registered Nurse Causey, Charlestine Massed, NP as Nurse Practitioner (Hematology and Oncology)  DIAGNOSIS:  Encounter Diagnosis  Name Primary?  . Malignant neoplasm of upper-outer quadrant of left breast in female, estrogen receptor positive ([place])     SUMMARY OF ONCOLOGIC HISTORY:   Breast cancer of upper-outer quadrant of left female breast ([place])   01/30/2014 Mammogram    Left breast: 2.4 cm irregular high density mass with indistinct margin, middle depth, 6.8 cm from nipple, with architectural distortion. There is also a 3.5 cm irregular high density asymmetry at 1:00, anterior depth, with arch distortion and thickening.      01/30/2014 Breast US    Left breast: 3.7 cm irregular mass with indistinct margin at 2:00, posterior deth, 4 cm from nipple, hypoechoic. 10 x 1 cm irregular mass with lobulated and indistinct margin at 3:00, posterior depth. A third 0.9 cm lobulated mass at 1:00, posterior depth      02/04/2014 Initial Biopsy    Left breast core needle bx x 3: Invasive ductal carcinoma with DCIS ER 100%, PR 100%, HER-2 negative, Ki-67 ranged from 19-26%; third biopsy showed invasive mammary cancer with lobular features and intraductal papilloma, ER/PR positive HER-2 negative      02/11/2014 Breast MRI    Left breast: 3 masses that are confluent measuring 5.6 x 2.7 x 3 cm extending to the left aspect of the areola, appearing to involve the skin of theareola, deviating the nipple towards the left.  No axillary findings.  Neg rightt breast.      02/13/2014 Clinical Stage    Stage IIB: T3 N0      03/07/2014 Definitive Surgery    Left mastectomy/SLNB: invasive ductal  carcinoma grade 1; 4.8 cm with low-grade DCIS; margins negative 1/4 lymph nodes positive, ER 100% PR 100% HER-2 negative ratio 1.18 Ki-67 26% and 19%       03/07/2014 Pathologic Stage    Stage IIB (T2 N1)      04/03/2014 - 08/13/2014 Chemotherapy    Adjuvant chemotherapy with dose dense adriamycin and cytoxan followed by weekly abraxane 12      09/09/2014 - 10/17/2014 Radiation Therapy    Adjuvant RT [name]): Left chest wall 50.4 Gy over 28 fractions. Left supraclavicular fossa and axilla: 45 Gy over 25 fractions      10/27/2014 -  Anti-estrogen oral therapy    Anastrozole 1 mg daily plus Ibrance ([name] Clinical Trial stopped 06/11/16)      01/16/2015 Survivorship    Survivorship visit completed and copy of care plan provided to patient.       CHIEF COMPLIANT: Follow-up after resumption of anastrozole therapy  INTERVAL HISTORY: Theresa Huff is a 66 year old with above-mentioned history of from left breast cancer currently on anastrozole therapy. We stopped Palbociclib because of toxicities. This was on the clinical trial [name]. Patient complains of severe pain in bilateral shoulders as well as in her right hip. Because of this pain she is having difficulty with sleeping. Because of lack of sleeping she's been having headaches frequently. Overall she does not feel a whole lot different than when she was taking Palbociclib.  REVIEW OF SYSTEMS:   Constitutional: Denies fevers, chills or abnormal weight loss Eyes: Denies blurriness  of vision Ears, nose, mouth, throat, and face: Denies mucositis or sore throat Respiratory: Denies cough, dyspnea or wheezes Cardiovascular: Denies palpitation, chest discomfort Gastrointestinal:  Denies nausea, heartburn or change in bowel habits Skin: Denies abnormal skin rashes Lymphatics: Denies new lymphadenopathy or easy bruising Neurological:Denies numbness, tingling or new weaknesses Behavioral/Psych: Mood is stable, no new changes    Extremities: Severe pain in bilateral shoulders and right hip Breast: Prior left mastectomy All other systems were reviewed with the patient and are negative.  I have reviewed the past medical history, past surgical history, social history and family history with the patient and they are unchanged from previous note.  ALLERGIES:  is allergic to hydrocodone; tramadol; codeine; iodine; primidone; radiaplexrx [skin protectants, misc.]; sulfa antibiotics; and tape.  MEDICATIONS:  Current Outpatient Prescriptions  Medication Sig Dispense Refill  . calcium carbonate (OS-CAL) 600 MG TABS tablet Take 600 mg by mouth daily.    . cetirizine (ZYRTEC) 10 MG tablet Take 10 mg by mouth daily.      . Cholecalciferol (VITAMIN D PO) Take 2,000 Units by mouth daily.    . cyclobenzaprine (FEXMID) 7.5 MG tablet Take 1 tablet (7.5 mg total) by mouth 3 (three) times daily as needed for muscle spasms. 30 tablet 0  . DULoxetine (CYMBALTA) 30 MG capsule Take 30 mg by mouth daily.     . Eszopiclone (ESZOPICLONE) 3 MG TABS Take 3 mg by mouth at bedtime. Take immediately before bedtime    . fluticasone (FLONASE) 50 MCG/ACT nasal spray     . letrozole (FEMARA) 2.5 MG tablet Take 1 tablet (2.5 mg total) by mouth daily. 30 tablet 0  . magnesium gluconate (MAGONATE) 500 MG tablet Take 500 mg by mouth once.    . omeprazole (PRILOSEC) 40 MG capsule Take 40 mg by mouth daily.  5  . oxyCODONE-acetaminophen (PERCOCET/ROXICET) 5-325 MG tablet Take 1 tablet by mouth every 6 (six) hours as needed for severe pain. 20 tablet 0  . SUMAtriptan (IMITREX) 50 MG tablet Take 1 tablet (50 mg total) by mouth every 2 (two) hours as needed for migraine. May repeat in 2 hours if headache persists or recurs. 10 tablet 0  . Vitamin D, Ergocalciferol, (DRISDOL) 50000 UNITS CAPS capsule Take 50,000 Units by mouth every 7 (seven) days.     No current facility-administered medications for this visit.     PHYSICAL EXAMINATION: ECOG PERFORMANCE  STATUS: 1 - Symptomatic but completely ambulatory  Vitals:   09/13/16 0855  BP: 123/76  Pulse: 73  Resp: 18  Temp: 98.3 F (36.8 C)  SpO2: 100%   Filed Weights   09/13/16 0855  Weight: 155 lb 4.8 oz (70.4 kg)    GENERAL:alert, no distress and comfortable SKIN: skin color, texture, turgor are normal, no rashes or significant lesions EYES: normal, Conjunctiva are pink and non-injected, sclera clear OROPHARYNX:no exudate, no erythema and lips, buccal mucosa, and tongue normal  NECK: supple, thyroid normal size, non-tender, without nodularity LYMPH:  no palpable lymphadenopathy in the cervical, axillary or inguinal LUNGS: clear to auscultation and percussion with normal breathing effort HEART: regular rate & rhythm and no murmurs and no lower extremity edema ABDOMEN:abdomen soft, non-tender and normal bowel sounds MUSCULOSKELETAL:no cyanosis of digits and no clubbing  NEURO: alert & oriented x 3 with fluent speech, no focal motor/sensory deficits EXTREMITIES: No lower extremity edema  LABORATORY DATA:  I have reviewed the data as listed   Chemistry      Component Value Date/Time     NA 139 09/13/2016 0832   K 5.0 09/13/2016 0832   CL 101 07/15/2015 1745   CO2 26 09/13/2016 0832   BUN 15.4 09/13/2016 0832   CREATININE 0.9 09/13/2016 0832      Component Value Date/Time   CALCIUM 9.6 09/13/2016 0832   ALKPHOS 77 09/13/2016 0832   AST 14 09/13/2016 0832   ALT 16 09/13/2016 0832   BILITOT 0.57 09/13/2016 0832       Lab Results  Component Value Date   WBC 3.9 09/13/2016   HGB 12.9 09/13/2016   HCT 40.9 09/13/2016   MCV 94.5 09/13/2016   PLT 210 09/13/2016   NEUTROABS 1.8 09/13/2016    ASSESSMENT & PLAN:  Breast cancer of upper-outer quadrant of left female breast Left breast invasive ductal carcinoma grade 1; 4.8 cm with low-grade DCIS; margins negative 1/4 lymph nodes positive, ER 100% PR 100% HER-2 negative ratio 1.18 Ki-67 26% and 19% T2 N1 stage IIB status post  adjuvant chemotherapy with dose dense Adriamycin and Cytoxan 4 followed by Abraxane weekly 12 Completed Adjuvant radiation therapy 10/17/14  Current Treatment: [name] study: Randomized to Ibrance + Anastrozole Started 10/27/2014;Discontinued Palbociclib for toxicities 06/11/2016 (primarily neutropenia)  Anastrozole toxicities: Patient is experiencing the bilateral shoulder and hip pain. Because of this I instructed her to stop anastrozole for a couple weeks and then start letrozole. I sent a prescription for letrozole today. We given 30 day supply. If she tolerates it better, then I consent her 90 day prescription. If she is not able to tolerate letrozole then we will send a prescription for tamoxifen.  Disc laminectomy 06/09/2016: Postoperatively patient is doing quite well. Pain in the back has markedly subsided and she is able to walk without any major limitations.  Surveillance: Patient will need mammograms in the right breast  Return to clinic in 6 months for follow-up.  I spent 25 minutes talking to the patient of which more than half was spent in counseling and coordination of care.  Orders Placed This Encounter  Procedures  . CBC with Differential    Standing Status:   Future    Number of Occurrences:   1    Standing Expiration Date:   09/13/2017  . Comprehensive metabolic panel    Standing Status:   Future    Number of Occurrences:   1    Standing Expiration Date:   09/13/2017   The patient has a good understanding of the overall plan. she agrees with it. she will call with any problems that may develop before the next visit here.   Gudena, Vinay K, MD 09/13/16    

## 2016-09-13 NOTE — Assessment & Plan Note (Signed)
Left breast invasive ductal carcinoma grade 1; 4.8 cm with low-grade DCIS; margins negative 1/4 lymph nodes positive, ER 100% PR 100% HER-2 negative ratio 1.18 Ki-67 26% and 19% T2 N1 stage IIB status post adjuvant chemotherapy with dose dense Adriamycin and Cytoxan 4 followed by Abraxane weekly 12 Completed Adjuvant radiation therapy 10/17/14  Current Treatment: [name] study: Randomized to Ibrance + Anastrozole Started 10/27/2014;Discontinued Palbociclib for toxicities 06/11/2016 (primarily neutropenia)  Anastrozole toxicities:  Disc laminectomy 06/09/2016: Postoperatively patient is doing quite well. Pain in the back has markedly subsided and she is able to walk without any major limitations.  Surveillance: Patient will need mammograms in the right breast  Return to clinic in 1 year for follow-up  

## 2016-09-14 MED ORDER — SUMATRIPTAN SUCCINATE 50 MG PO TABS: 50.0000 mg | ORAL_TABLET | ORAL | 0 refills | Status: DC | PRN

## 2016-10-07 ENCOUNTER — Telehealth: Payer: Self-pay | Admitting: Medical Oncology

## 2016-10-07 NOTE — Telephone Encounter (Signed)
[name]: Spoke with patient regarding change in anti hormone therapy. Patient in clinic on 8/20 to see Dr. Lindi Adie and was told to stop Anastrozole for a couple of weeks, due to ongoing Anastrozole toxicities. Patient was provided with new prescription to start Letrozole after break from Anastrozole (see MD note 09/13/16). Confirmed with patient that she stopped Anastrozole on 09/13/16 and started Letrozole on 09/26/2016. Patient thanked for her time and continued support of study and encouraged to call clinic with questions or concerns.  Adele Dan, RN, BSN Clinical Research

## 2016-10-10 ENCOUNTER — Other Ambulatory Visit: Payer: Self-pay | Admitting: Hematology and Oncology

## 2016-10-21 IMAGING — MR MR LUMBAR SPINE W/O CM
4 of 5 series · 18 of 48 positions shown · non-contrast
Comparison: None.

CLINICAL DATA: Low back pain for 1 month radiating to LEFT hip,
exacerbated by standing and walking. LEFT leg weakness resulting in
multiple falls.

EXAM:
MRI LUMBAR SPINE WITHOUT CONTRAST
TECHNIQUE: Multiplanar, multisequence MR imaging of the lumbar spine was
performed. No intravenous contrast was administered.

[Series 3: T2 · sagittal · 4.0mm · 0.55mm/px · 6 of 15 slices shown (1 of 2)]
[im 1/15]
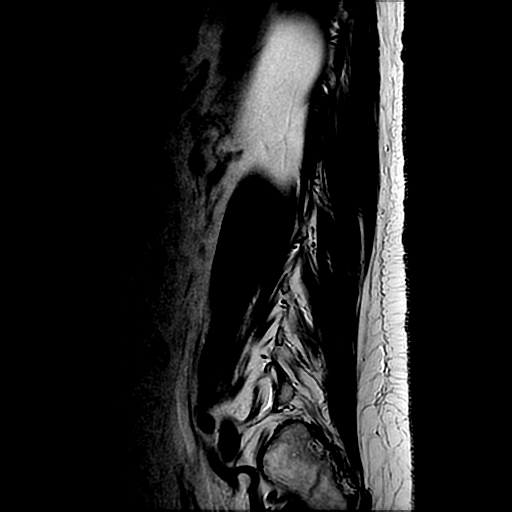
[im 3/15]
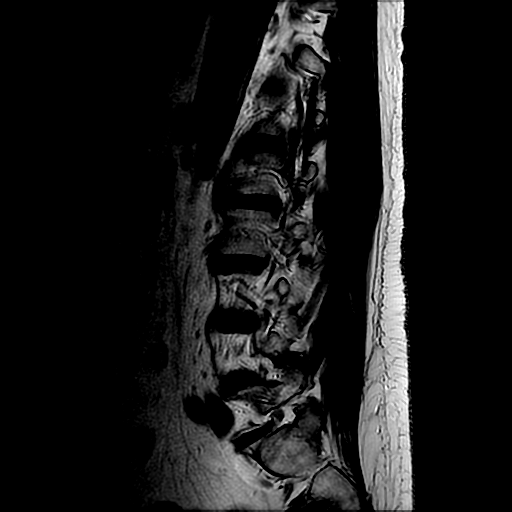
[im 6/15]
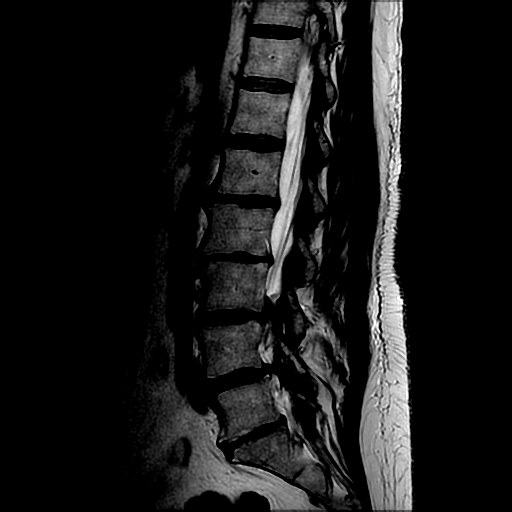
[im 9/15]
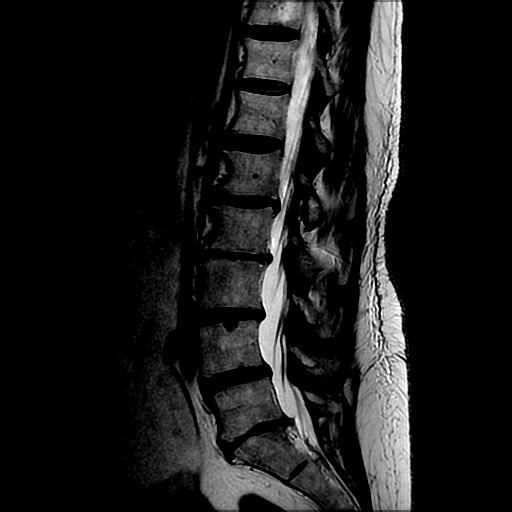
[im 12/15]
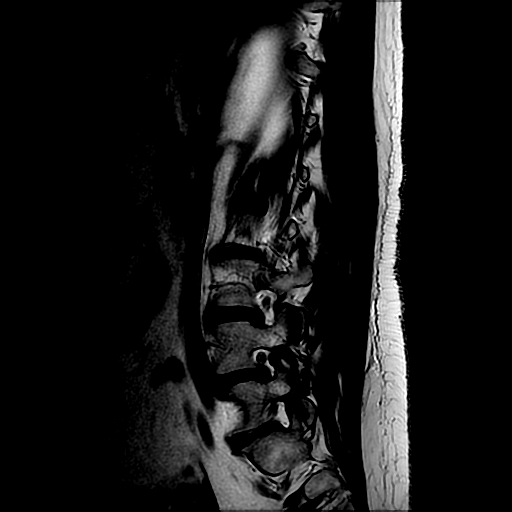
[im 15/15]
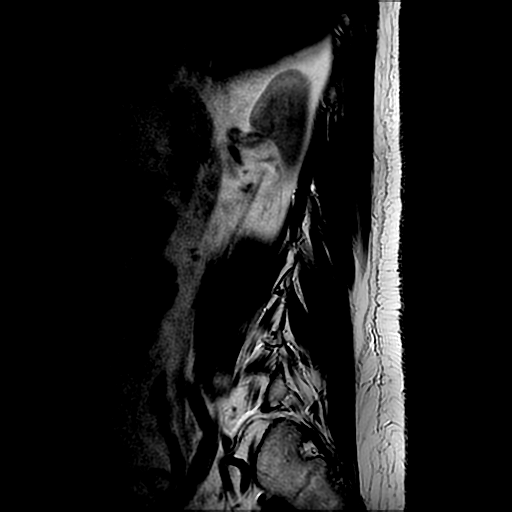

[Series 4: T1 · sagittal · 4.0mm · 0.55mm/px · 3 of 15 slices shown (1 of 2)]
[im 3/15]
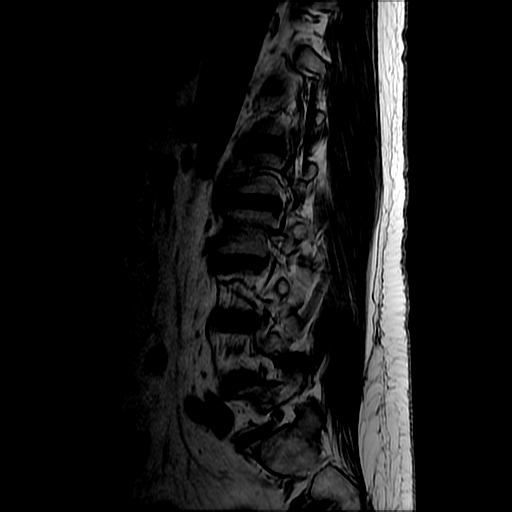
[im 9/15]
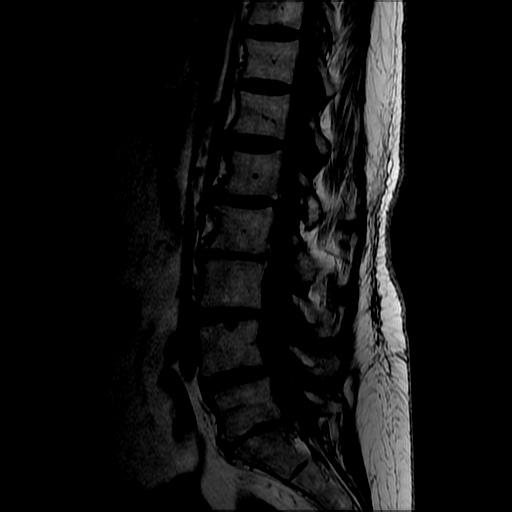
[im 15/15]
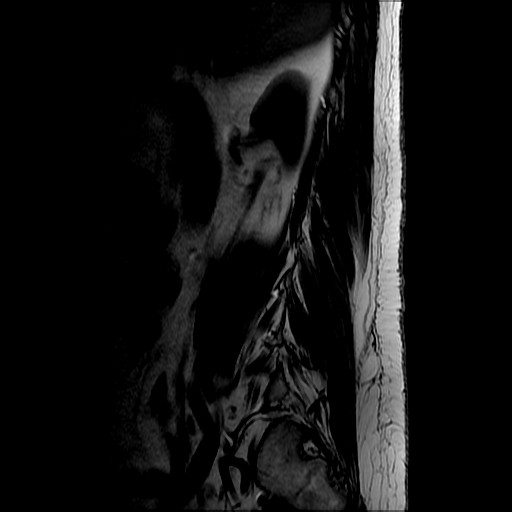

[Series 6: T2 · axial · 4.0mm · 0.39mm/px · z∈[-111,+61]mm · 6 of 40 slices shown (2 of 2)]
[im 1/40]
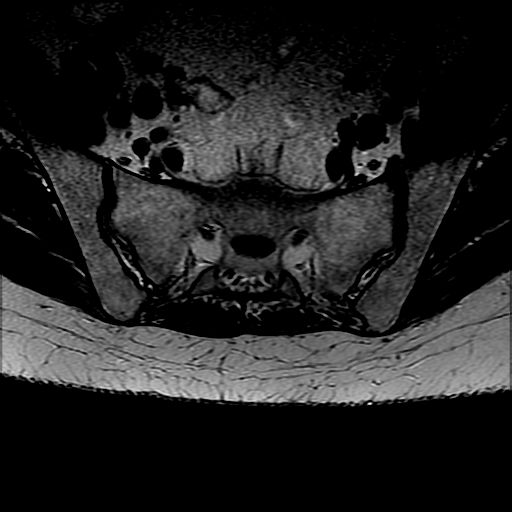
[im 6/40]
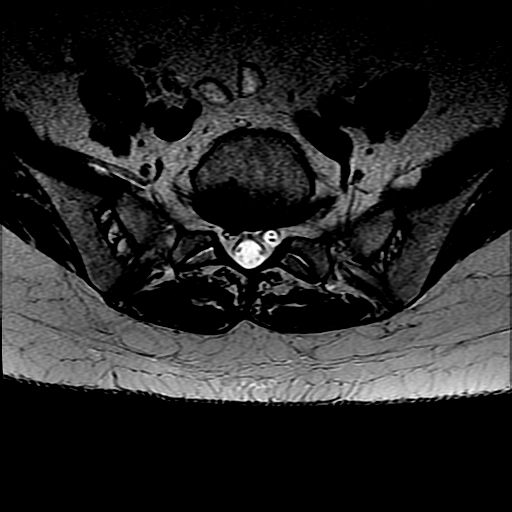
[im 12/40]
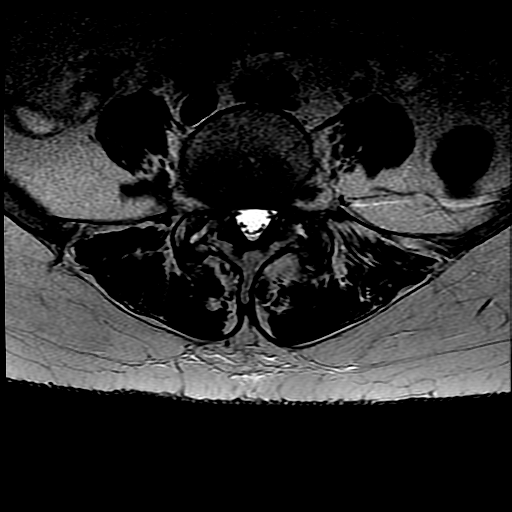
[im 17/40]
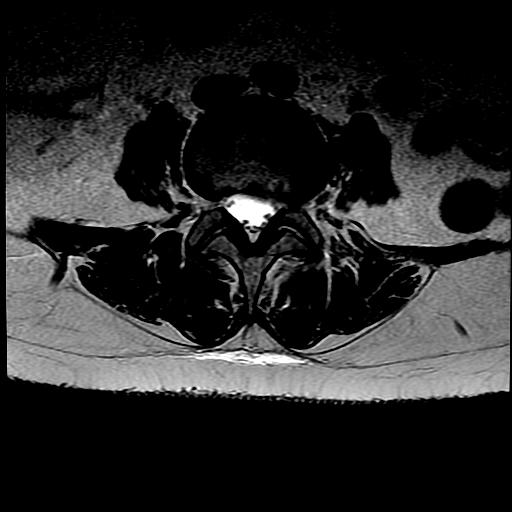
[im 20/40]
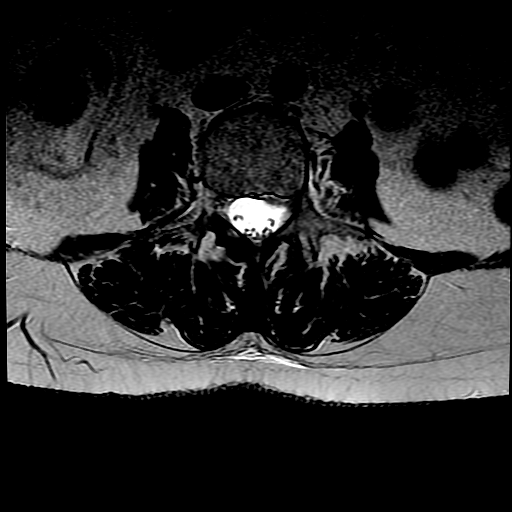
[im 34/40]
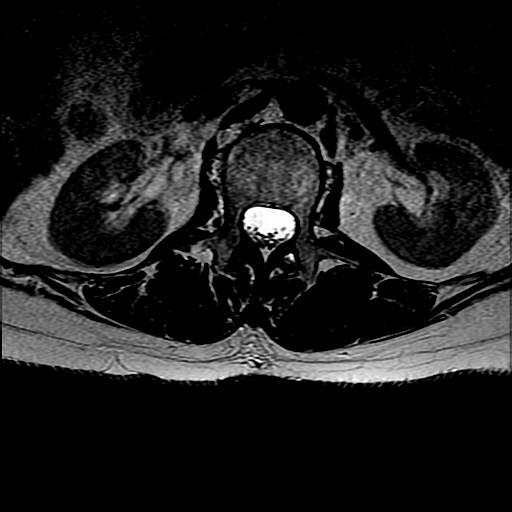

[Series 7: T1 · axial · 4.0mm · 0.39mm/px · z∈[-86,+61]mm · 3 of 40 slices shown (2 of 2)]
[im 6/40]
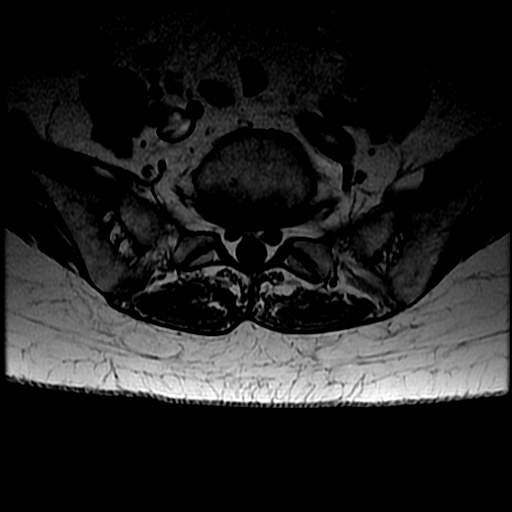
[im 20/40]
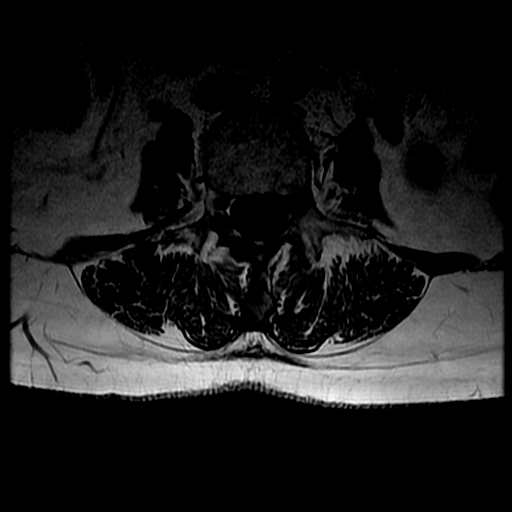
[im 34/40]
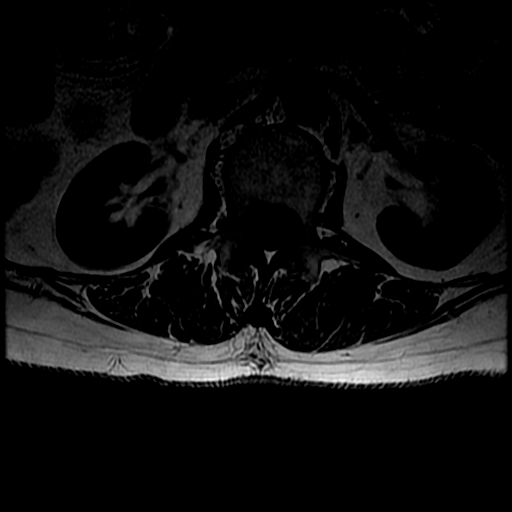

[18 of 48 positions shown; findings below may reference images not displayed]

FINDINGS: OSSEOUS STRUCTURES: Lumbar vertebral bodies are intact and aligned
with maintenance of lumbar lordosis. Using the reference level of
the last well-formed intervertebral disc as L5-S1, severe L2-3 and
L5-S1 disc height loss, moderate remaining lumbar disc height loss
with decreased T2 signal within all disc compatible with
desiccation. Associated moderate subacute on chronic discogenic
endplate changes L1-2, L2-3 and L5-S1, mild at the remaining lumbar
levels. Superimposed minimal acute discogenic endplate changes L2-3
and L3-4. No STIR signal abnormality to suggest fracture.

SPINAL CORD: Conus medullaris terminates at L1-2 and demonstrates
normal morphology and signal characteristics. Cauda equina is
normal.

SOFT TISSUES: Included prevertebral and paraspinal soft tissues are
normal.

LEVEL BY LEVEL EVALUATION:

T12-L1: No disc bulge, canal stenosis nor neural foraminal
narrowing.

L1-2: Annular bulging. No canal stenosis or neural foraminal
narrowing.

L2-3: Small broad-based disc bulge asymmetric to the RIGHT. Mild
facet arthropathy and ligamentum flavum redundancy without canal
stenosis. Mild RIGHT neural foraminal narrowing.

L3-4: 7 x 4 mm (transverse by AP) LEFT central disc extrusion with
11 mm of contiguous superior migration. Effacement LEFT lateral
recess, contacting the traversing LEFT L4 nerve. Mild facet
arthropathy and ligamentum flavum redundancy without canal stenosis.
Moderate LEFT neural foraminal narrowing due to extruded disc.

L4-5: Small broad-based disc bulge. Severe facet arthropathy and
ligamentum flavum redundancy. Very mild canal stenosis. Mild RIGHT,
minimal LEFT neural foraminal narrowing.

L5-S1: Small broad-based disc bulge. Mild facet arthropathy without
canal stenosis. Minimal LEFT neural foraminal narrowing.
IMPRESSION: 7 x 4 x 11 mm LEFT central L3-4 disc extrusion contacts the
traversing LEFT L4 nerve and results in moderate LEFT L3-4 neural
foraminal narrowing.

No acute fracture or malalignment.  Very mild canal stenosis L4-5.

## 2016-11-07 ENCOUNTER — Other Ambulatory Visit: Payer: Self-pay | Admitting: Hematology and Oncology

## 2016-11-30 ENCOUNTER — Telehealth: Payer: Self-pay | Admitting: Medical Oncology

## 2016-11-30 NOTE — Telephone Encounter (Signed)
[name]: 6 month follow-up call LVMOM with patient asking patient to return call at earliest convenience regarding how she is doing at this 6 month from last visit. Patient thanked and contact information provided.  Adele Dan, RN, BSN Clinical Research 11/30/2016 1:21 PM

## 2016-12-03 ENCOUNTER — Telehealth: Payer: Self-pay | Admitting: Medical Oncology

## 2016-12-03 NOTE — Telephone Encounter (Signed)
[name] LVMOM again for 6 month follow-up call. Asked patient to return call at earliest convenience. Contact number provided.  Adele Dan, RN, BSN Clinical Research 12/03/2016 12:43 PM

## 2016-12-04 ENCOUNTER — Other Ambulatory Visit: Payer: Self-pay | Admitting: Hematology and Oncology

## 2016-12-09 ENCOUNTER — Telehealth: Payer: Self-pay | Admitting: Medical Oncology

## 2016-12-09 NOTE — Telephone Encounter (Signed)
[name]: 6 month follow-up call I spoke with patient today. Patient confirms to be doing well. States having no issues with current anti-hormone letrozole. Patient does report to have had back surgery last week and has since returned to work on Monday. No new anti-cancer meds. Patient thanked for her time and continued support of study. Patient encouraged to call Dr. Lindi Adie or myself with any questions or concerns she may have.  Adele Dan, RN, BSN Clinical Research 12/09/2016 3:45 PM

## 2017-01-05 ENCOUNTER — Other Ambulatory Visit: Payer: Self-pay | Admitting: Hematology and Oncology

## 2017-01-09 ENCOUNTER — Other Ambulatory Visit: Payer: Self-pay | Admitting: Hematology and Oncology

## 2017-01-31 ENCOUNTER — Encounter: Payer: Self-pay | Admitting: Hematology and Oncology

## 2017-01-31 DIAGNOSIS — C50412 Malignant neoplasm of upper-outer quadrant of left female breast: Secondary | ICD-10-CM

## 2017-02-16 NOTE — Progress Notes (Signed)
Received pt Theresa Huff reports. Pt next appt with MD on 2/20

## 2017-02-25 ENCOUNTER — Telehealth: Payer: Self-pay | Admitting: Medical Oncology

## 2017-02-25 NOTE — Telephone Encounter (Signed)
[name] Return call from patient stating she is fine with change in appointments. Patient aware of the time frame for 12 month follow up and requests an early morning appointment and not on a Tuesday. I thanked patient for her flexibility and continued support of study and informed her that a scheduler will call her with new appointment time. Patient gave verbal understanding.  Adele Dan, RN, BSN Clinical Research 02/25/2017 4:16 PM

## 2017-02-25 NOTE — Telephone Encounter (Signed)
[name] LVMOM with patient regarding upcoming appointment and rescheduling to stay within study required parameters. Patient has a required 12 month study follow-up visit and would need to be seen by MD between April 10 through June 17. Inquired with patient about changing her February 20th appt to the study visit window of time. Informed patient that I spoke with Dr. Lindi Adie and he is ok with this. Contact information provided for patient to return call. Adele Dan, RN, BSN Clinical Research 02/25/2017 2:32 PM

## 2017-02-28 ENCOUNTER — Telehealth: Payer: Self-pay | Admitting: Hematology and Oncology

## 2017-02-28 NOTE — Telephone Encounter (Signed)
Scheduled appt per 2/1 sch msg - spoke with patient regarding appts.

## 2017-03-16 ENCOUNTER — Ambulatory Visit: Payer: 59 | Admitting: Hematology and Oncology

## 2017-05-25 ENCOUNTER — Inpatient Hospital Stay: Payer: 59 | Attending: Hematology and Oncology | Admitting: Hematology and Oncology

## 2017-05-25 ENCOUNTER — Encounter: Payer: Self-pay | Admitting: Medical Oncology

## 2017-05-25 DIAGNOSIS — Z006 Encounter for examination for normal comparison and control in clinical research program: Secondary | ICD-10-CM | POA: Diagnosis present

## 2017-05-25 DIAGNOSIS — I890 Lymphedema, not elsewhere classified: Secondary | ICD-10-CM | POA: Diagnosis not present

## 2017-05-25 DIAGNOSIS — N951 Menopausal and female climacteric states: Secondary | ICD-10-CM

## 2017-05-25 DIAGNOSIS — Z923 Personal history of irradiation: Secondary | ICD-10-CM

## 2017-05-25 DIAGNOSIS — C50412 Malignant neoplasm of upper-outer quadrant of left female breast: Secondary | ICD-10-CM | POA: Insufficient documentation

## 2017-05-25 DIAGNOSIS — Z170 Estrogen receptor positive status [ER+]: Secondary | ICD-10-CM | POA: Diagnosis not present

## 2017-05-25 DIAGNOSIS — Z9221 Personal history of antineoplastic chemotherapy: Secondary | ICD-10-CM | POA: Diagnosis not present

## 2017-05-25 DIAGNOSIS — Z79811 Long term (current) use of aromatase inhibitors: Secondary | ICD-10-CM | POA: Insufficient documentation

## 2017-05-25 DIAGNOSIS — Z9012 Acquired absence of left breast and nipple: Secondary | ICD-10-CM | POA: Diagnosis not present

## 2017-05-25 MED ORDER — LORAZEPAM 1 MG PO TABS: 1.0000 mg | ORAL_TABLET | Freq: Three times a day (TID) | ORAL | 0 refills | Status: DC | PRN

## 2017-05-25 MED ORDER — LETROZOLE 2.5 MG PO TABS: 2.5000 mg | ORAL_TABLET | Freq: Every day | ORAL | 3 refills | Status: DC

## 2017-05-25 NOTE — Assessment & Plan Note (Signed)
Left breast invasive ductal carcinoma grade 1; 4.8 cm with low-grade DCIS; margins negative 1/4 lymph nodes positive, ER 100% PR 100% HER-2 negative ratio 1.18 Ki-67 26% and 19% T2 N1 stage IIB status post adjuvant chemotherapy with dose dense Adriamycin and Cytoxan 4 followed by Abraxane weekly 12 Completed Adjuvant radiation therapy 10/17/14 ------------------------------------------------------------------------------- Current Treatment: [name] study: Randomized to Ibrance + Anastrozole Started 10/27/2014;Discontinued Palbociclib for toxicities 06/11/2016 (primarily neutropenia); Switched to Letrozole 09/13/2016 (shoulder pain)  Letrozole toxicities:   Disc laminectomy 06/09/2016: Postoperatively patient is doing quite well. Pain in the back has markedly subsided and she is able to walk without any major limitations.  Surveillance: 1. Breast Exam: 05/25/17: Benign 2. Mammograms 02/04/17: [facility]: No evidence of malignancy  Return to clinic in 6 months for follow-up.

## 2017-05-25 NOTE — Progress Notes (Signed)
Patient Care Team: Antony Contras, MD as PCP - General (Family Medicine) Elsie Stain, MD (Pulmonary Disease) Rolm Bookbinder, MD as Consulting Physician (General Surgery) Nicholas Lose, MD as Consulting Physician (Hematology and Oncology) Rockwell Germany, RN as Registered Nurse Mauro Kaufmann, RN as Registered Nurse Causey, Charlestine Massed, NP as Nurse Practitioner (Hematology and Oncology) Maxwell Marion, RN as Registered Nurse (Medical Oncology)  DIAGNOSIS:  Encounter Diagnosis  Name Primary?  . Malignant neoplasm of upper-outer quadrant of left breast in female, estrogen receptor positive ([place])     SUMMARY OF ONCOLOGIC HISTORY:   Breast cancer of upper-outer quadrant of left female breast ([place])   01/30/2014 Mammogram    Left breast: 2.4 cm irregular high density mass with indistinct margin, middle depth, 6.8 cm from nipple, with architectural distortion. There is also a 3.5 cm irregular high density asymmetry at 1:00, anterior depth, with arch distortion and thickening.      01/30/2014 Breast US    Left breast: 3.7 cm irregular mass with indistinct margin at 2:00, posterior deth, 4 cm from nipple, hypoechoic. 10 x 1 cm irregular mass with lobulated and indistinct margin at 3:00, posterior depth. A third 0.9 cm lobulated mass at 1:00, posterior depth      02/04/2014 Initial Biopsy    Left breast core needle bx x 3: Invasive ductal carcinoma with DCIS ER 100%, PR 100%, HER-2 negative, Ki-67 ranged from 19-26%; third biopsy showed invasive mammary cancer with lobular features and intraductal papilloma, ER/PR positive HER-2 negative      02/11/2014 Breast MRI    Left breast: 3 masses that are confluent measuring 5.6 x 2.7 x 3 cm extending to the left aspect of the areola, appearing to involve the skin of theareola, deviating the nipple towards the left.  No axillary findings.  Neg rightt breast.      02/13/2014 Clinical Stage    Stage IIB: T3 N0      03/07/2014  Definitive Surgery    Left mastectomy/SLNB: invasive ductal carcinoma grade 1; 4.8 cm with low-grade DCIS; margins negative 1/4 lymph nodes positive, ER 100% PR 100% HER-2 negative ratio 1.18 Ki-67 26% and 19%       03/07/2014 Pathologic Stage    Stage IIB (T2 N1)      04/03/2014 - 08/13/2014 Chemotherapy    Adjuvant chemotherapy with dose dense adriamycin and cytoxan followed by weekly abraxane 12      09/09/2014 - 10/17/2014 Radiation Therapy    Adjuvant RT [name]): Left chest wall 50.4 Gy over 28 fractions. Left supraclavicular fossa and axilla: 45 Gy over 25 fractions      10/27/2014 -  Anti-estrogen oral therapy    Anastrozole 1 mg daily plus Ibrance ([name] Clinical Trial stopped 06/11/16)      01/16/2015 Survivorship    Survivorship visit completed and copy of care plan provided to patient.       CHIEF COMPLIANT: Follow-up on letrozole therapy  INTERVAL HISTORY: Theresa Huff is a 67 year old with above-mentioned history of left breast cancer treated with left mastectomy followed by adjuvant chemotherapy and radiation therapy.  She is currently on antiestrogen therapy with letrozole.  She could not tolerate anastrozole because of musculoskeletal symptoms.  She appears to be tolerating letrozole much better.  But denies any hot flashes.  Denies any lumps or nodules in the left chest wall or axilla.  Denies any lumps or nodules in the right breast. She complains of fatigue and weight gain.  She has  had a couple of back surgeries and because of that she has not been exercising too much.  She has had episodes where she was falling asleep at her desk.  She is going to see her primary care physician for the cause of the fatigue.  She also has discomfort in the left armpit from lymphedema.  She wears a lymphedema sleeve on the left arm.  She is requesting a nighttime garment.  REVIEW OF SYSTEMS:   Constitutional: Complains of fatigue Eyes: Denies blurriness of vision Ears, nose,  mouth, throat, and face: Denies mucositis or sore throat Respiratory: Denies cough, dyspnea or wheezes Cardiovascular: Denies palpitation, chest discomfort Gastrointestinal:  Denies nausea, heartburn or change in bowel habits Skin: Denies abnormal skin rashes Lymphatics: Denies new lymphadenopathy or easy bruising Neurological:Denies numbness, tingling or new weaknesses Behavioral/Psych: Mood is stable, no new changes  Extremities: Mild left arm lymphedema wears a sleeve Breast: Prior left mastectomy All other systems were reviewed with the patient and are negative.  I have reviewed the past medical history, past surgical history, social history and family history with the patient and they are unchanged from previous note.  ALLERGIES:  is allergic to hydrocodone; tramadol; codeine; iodine; primidone; radiaplexrx [skin protectants, misc.]; sulfa antibiotics; and tape.  MEDICATIONS:  Current Outpatient Medications  Medication Sig Dispense Refill  . calcium carbonate (OS-CAL) 600 MG TABS tablet Take 600 mg by mouth daily.    . cetirizine (ZYRTEC) 10 MG tablet Take 10 mg by mouth daily.      . DULoxetine (CYMBALTA) 30 MG capsule Take 30 mg by mouth daily.     . fluticasone (FLONASE) 50 MCG/ACT nasal spray     . letrozole (FEMARA) 2.5 MG tablet Take 1 tablet (2.5 mg total) by mouth daily. 90 tablet 3  . LORazepam (ATIVAN) 1 MG tablet Take 1 tablet (1 mg total) by mouth every 8 (eight) hours as needed for anxiety (or nausea). 30 tablet 0  . magnesium gluconate (MAGONATE) 500 MG tablet Take 500 mg by mouth once.    Marland Kitchen omeprazole (PRILOSEC) 40 MG capsule Take 40 mg by mouth daily.  5  . SUMAtriptan (IMITREX) 50 MG tablet Take 1 tablet (50 mg total) by mouth every 2 (two) hours as needed for migraine. May repeat in 2 hours if headache persists or recurs. 10 tablet 0  . Vitamin D, Ergocalciferol, (DRISDOL) 50000 UNITS CAPS capsule Take 50,000 Units by mouth every 7 (seven) days.     No current  facility-administered medications for this visit.     PHYSICAL EXAMINATION: ECOG PERFORMANCE STATUS: 1 - Symptomatic but completely ambulatory  Vitals:   05/25/17 0808  BP: 135/89  Pulse: 81  Resp: 14  Temp: 97.8 F (36.6 C)  SpO2: 100%   Filed Weights   05/25/17 0808  Weight: 161 lb 14.4 oz (73.4 kg)    GENERAL:alert, no distress and comfortable SKIN: skin color, texture, turgor are normal, no rashes or significant lesions EYES: normal, Conjunctiva are pink and non-injected, sclera clear OROPHARYNX:no exudate, no erythema and lips, buccal mucosa, and tongue normal  NECK: supple, thyroid normal size, non-tender, without nodularity LYMPH:  no palpable lymphadenopathy in the cervical, axillary or inguinal LUNGS: clear to auscultation and percussion with normal breathing effort HEART: regular rate & rhythm and no murmurs and no lower extremity edema ABDOMEN:abdomen soft, non-tender and normal bowel sounds MUSCULOSKELETAL:no cyanosis of digits and no clubbing  NEURO: alert & oriented x 3 with fluent speech, no focal motor/sensory deficits EXTREMITIES:  No lower extremity edema BREAST: No palpable lumps or nodules in the left chest wall right breast.  (exam performed in the presence of a chaperone)  LABORATORY DATA:  I have reviewed the data as listed CMP Latest Ref Rng & Units 09/13/2016 06/09/2016 05/10/2016  Glucose 70 - 140 mg/dl 115 99 88  BUN 7.0 - 26.0 mg/dL 15.4 20.7 15.7  Creatinine 0.6 - 1.1 mg/dL 0.9 0.9 0.8  Sodium 136 - 145 mEq/L 139 141 144  Potassium 3.5 - 5.1 mEq/L 5.0 4.4 4.7  Chloride 101 - 111 mmol/L - - -  CO2 22 - 29 mEq/L 26 26 27   Calcium 8.4 - 10.4 mg/dL 9.6 10.6(H) 9.9  Total Protein 6.4 - 8.3 g/dL 6.4 7.7 6.6  Total Bilirubin 0.20 - 1.20 mg/dL 0.57 1.33(H) 0.76  Alkaline Phos 40 - 150 U/L 77 85 71  AST 5 - 34 U/L 14 13 14   ALT 0 - 55 U/L 16 19 14     Lab Results  Component Value Date   WBC 3.9 09/13/2016   HGB 12.9 09/13/2016   HCT 40.9  09/13/2016   MCV 94.5 09/13/2016   PLT 210 09/13/2016   NEUTROABS 1.8 09/13/2016    ASSESSMENT & PLAN:  Breast cancer of upper-outer quadrant of left female breast Left breast invasive ductal carcinoma grade 1; 4.8 cm with low-grade DCIS; margins negative 1/4 lymph nodes positive, ER 100% PR 100% HER-2 negative ratio 1.18 Ki-67 26% and 19% T2 N1 stage IIB status post adjuvant chemotherapy with dose dense Adriamycin and Cytoxan 4 followed by Abraxane weekly 12 Completed Adjuvant radiation therapy 10/17/14 ------------------------------------------------------------------------------- Current Treatment: [name] study: Randomized to Ibrance + Anastrozole Started 10/27/2014;Discontinued Palbociclib for toxicities 06/11/2016 (primarily neutropenia); Switched to Letrozole 09/13/2016 (shoulder pain)  Letrozole toxicities:  Very occasional hot flashes. Does not have the myalgias related to prior anastrozole  Disc laminectomy 06/09/2016: Postoperatively patient is doing quite well. Pain in the back has markedly subsided and she is able to walk without any major limitations.  Surveillance: 1. Breast Exam: 05/25/17: Benign 2. Mammograms 02/04/17: [facility]: No evidence of malignancy  Return to clinic in 1 year for follow-up.   No orders of the defined types were placed in this encounter.  The patient has a good understanding of the overall plan. she agrees with it. she will call with any problems that may develop before the next visit here.   Harriette Ohara, MD 05/25/17

## 2017-05-25 NOTE — Progress Notes (Signed)
[name]: 12 month follow-up I met with patient this morning in clinic for her 12 month follow-up on study, after her vital assessments completed. Patient has been off of palbociclib and is in follow-up now. Concomitant medications reviewed and patient with no new anti-cancer medications. Patient also with no SAE's since her last visit. Patient had mammogram 02/04/2017 with normal results.All patient's questions answered to her satisfaction and patient thanked for her continued support of study. Patient to return to clinic in one year for study, sooner if needed. Patient encouraged to call Dr. Lindi Adie or myself with questions.  KEAGHAN BOWENS [phone]  05/25/2017  Adverse Event Log  Study/Protocol: [name] Follow-up  Event Grade Onset Date Resolved Date Palbociclib Anastrozole Neither Treatment Comments  Lymphedema left axilla Gr 1 ~ beginning of 10/2015 ongoing No No Yes Physical Therapy   Insomnia- worsening Gr 3 ~ end of Oct. 2017 ongoing No No Yes conmed   Concentration impairment Gr 1 ~ end of Oct. 2017 ongoing No No Yes None   Fatigue Gr 1 ~ Jan. 2018 ongoing Yes Yes No None   Lypmphocyte increased Gr 2 06/09/16 09/13/16 Yes No No None   Investigations- other: WBC increased Gr 1 06/09/16 09/13/16 No No Yes None   T. Bilirubin increased  Gr 1 06/09/16 09/13/16 No No Yes None   Hypercalcemia Gr 1 06/09/16 09/13/16 No No Yes None   Hypotension Gr 1 06/11/16 09/13/16 No No Yes None   Spasticity- to her left back Gr 1 06/09/16 ~06/19/16 No No Yes conmed   Pain- mild (to laminectomy site) Gr 1 06/09/16 ~06/11/16 No No Yes conmed     Adele Dan, RN, BSN Clinical Research 05/25/2017 9:36 AM

## 2017-05-26 ENCOUNTER — Telehealth: Payer: Self-pay | Admitting: Hematology and Oncology

## 2017-05-26 NOTE — Telephone Encounter (Signed)
Mailed patient calendar of upcoming May 2020 appointments.

## 2017-06-23 ENCOUNTER — Ambulatory Visit: Payer: 59 | Admitting: Podiatry

## 2017-06-23 ENCOUNTER — Ambulatory Visit (INDEPENDENT_AMBULATORY_CARE_PROVIDER_SITE_OTHER): Payer: 59

## 2017-06-23 ENCOUNTER — Encounter: Payer: Self-pay | Admitting: Podiatry

## 2017-06-23 VITALS — BP 123/82 | HR 75 | Resp 16

## 2017-06-23 DIAGNOSIS — M779 Enthesopathy, unspecified: Secondary | ICD-10-CM

## 2017-06-23 DIAGNOSIS — M778 Other enthesopathies, not elsewhere classified: Secondary | ICD-10-CM

## 2017-06-23 DIAGNOSIS — M7752 Other enthesopathy of left foot: Secondary | ICD-10-CM | POA: Diagnosis not present

## 2017-06-23 DIAGNOSIS — S90121A Contusion of right lesser toe(s) without damage to nail, initial encounter: Secondary | ICD-10-CM

## 2017-06-23 NOTE — Progress Notes (Signed)
Subjective:  [name], female    DOB: 06-08-1950,  MRN: [id number] HPI Chief Complaint  Patient presents with  . Foot Pain    Dorsal midfoot and anterior ankle left - sharp, sudden pains x several months, no treatment  . Toe Injury    3rd toe right - stumped toe 7 weeks ago, still discolored and swollen, tender sometimes, been taping toes together  . New Patient (Initial Visit)    67 y.o. female presents with the above complaint.   ROS: Denies fever chills nausea vomiting muscle aches pains calf pain back pain chest pain shortness of breath.  Past Medical History:  Diagnosis Date  . Allergic rhinitis due to pollen   . Allergy   . Anemia    with pregnancy  . Breast cancer ([place])   . Cough   . Depressive disorder, not elsewhere classified   . GERD (gastroesophageal reflux disease)   . Hot flashes   . Insomnia, unspecified   . Plantar fasciitis   . Radiation 09/09/14-10/17/14   Left chestwall/supraclav.  Marland Kitchen Restless leg syndrome 09/09/2010  . Skin cancer   . Wears glasses   . Weight gain    Past Surgical History:  Procedure Laterality Date  . COLONOSCOPY    . MASTECTOMY W/ SENTINEL NODE BIOPSY Left 03/07/2014   Procedure: LEFT TOTAL MASTECTOMY WITH LEFT SENTINEL LYMPH NODE BIOPSY;  Surgeon: Rolm Bookbinder, MD;  Location: [facility];  Service: General;  Laterality: Left;  Marland Kitchen MELANOMA EXCISION     right leg, left arm, right arm (several from each area)  . PORT-A-CATH REMOVAL N/A 12/04/2014   Procedure: REMOVAL PORT-A-CATH;  Surgeon: Rolm Bookbinder, MD;  Location: [facility];  Service: General;  Laterality: N/A;  . PORTACATH PLACEMENT Right 04/01/2014   Procedure: INSERTION PORT-A-CATH;  Surgeon: Rolm Bookbinder, MD;  Location: [facility];  Service: General;  Laterality: Right;  . TONSILLECTOMY      Current Outpatient Medications:  .  cetirizine (ZYRTEC) 10 MG tablet, Take 10 mg by mouth daily.  , Disp: , Rfl:  .  fluticasone  (FLONASE) 50 MCG/ACT nasal spray, , Disp: , Rfl:  .  letrozole (FEMARA) 2.5 MG tablet, Take 1 tablet (2.5 mg total) by mouth daily., Disp: 90 tablet, Rfl: 3 .  LORazepam (ATIVAN) 1 MG tablet, Take 1 tablet (1 mg total) by mouth every 8 (eight) hours as needed for anxiety (or nausea)., Disp: 30 tablet, Rfl: 0 .  naproxen (NAPROSYN) 500 MG tablet, , Disp: , Rfl:  .  SUMAtriptan (IMITREX) 50 MG tablet, Take 1 tablet (50 mg total) by mouth every 2 (two) hours as needed for migraine. May repeat in 2 hours if headache persists or recurs., Disp: 10 tablet, Rfl: 0 .  Vitamin D, Ergocalciferol, (DRISDOL) 50000 UNITS CAPS capsule, Take 50,000 Units by mouth every 7 (seven) days., Disp: , Rfl:   Allergies  Allergen Reactions  . Hydrocodone Nausea And Vomiting  . Tramadol Hives and Nausea And Vomiting  . Codeine Nausea And Vomiting  . Iodine Hives  . Primidone Itching and Nausea And Vomiting  . Radiaplexrx [Skin Protectants, Misc.] Other (See Comments)    Contraindicated on pharmacy profile  . Sulfa Antibiotics Hives  . Tape Other (See Comments)    Redness, skin tear   Review of Systems Objective:   Vitals:   06/23/17 1340  BP: 123/82  Pulse: 75  Resp: 16    General: Well developed, nourished, in no acute distress,  alert and oriented x3   Dermatological: Skin is warm, dry and supple bilateral. Nails x 10 are well maintained; remaining integument appears unremarkable at this time. There are no open sores, no preulcerative lesions, no rash or signs of infection present.  Vascular: Dorsalis Pedis artery and Posterior Tibial artery pedal pulses are 2/4 bilateral with immedate capillary fill time. Pedal hair growth present. No varicosities and no lower extremity edema present bilateral.   Neruologic: Grossly intact via light touch bilateral. Vibratory intact via tuning fork bilateral. Protective threshold with Semmes Wienstein monofilament intact to all pedal sites bilateral. Patellar and Achilles  deep tendon reflexes 2+ bilateral. No Babinski or clonus noted bilateral.   Musculoskeletal: No gross boney pedal deformities bilateral. No pain, crepitus, or limitation noted with foot and ankle range of motion bilateral. Muscular strength 5/5 in all groups tested bilateral.  She has pain on palpation and range of motion of the fourth fifth tarsometatarsal joint.  Mild edema overlying this area no ecchymosis.  Third toe right foot demonstrates mild to moderate edema and mild erythema with a prominence on the plantar aspect of the PIPJ this was immediately attempted to reduce this however it would not reduce and stay reduced.  Gait: Unassisted, Nonantalgic.    Radiographs:  Radiographs taken today of the right foot demonstrate what appears to be a telescopic dislocation of the PIPJ third digit right foot.  Contralateral foot demonstrates some joint space narrowing on lateral view and AP of the fourth and fifth tarsometatarsal articulation.  This correlates with her pain.  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  Telescoping dislocation of the PIPJ third.  Plan: Discussed etiology pathology conservative versus surgical therapies.  Tried reducing the third toe however he would not reduce.  So we taped it in place with Covan and dispensed Covan for her to continue to do this may need to consider surgical options in the future.   after sterile Betadine skin prep I injected 20 mg Kenalog 5 mg Marcaine to the point of maximal tenderness of the left foot tolerated procedure well without complications.     Max T. [place], [place]

## 2017-06-29 ENCOUNTER — Other Ambulatory Visit: Payer: Self-pay | Admitting: Ophthalmology

## 2017-07-15 NOTE — Progress Notes (Signed)
Subjective:   Theresa Huff was seen in consultation in the movement disorder clinic at the request of Antony Contras, MD.  The evaluation is for tremor.  Pt reports head and hand tremor for the last 5 years.  She is not sure which started first, as she didn't even realize her head was shaking until her sister pointed it out.  Head shakes in the "yes" direction.  Both hands tremor, but the L is worse than the right and she is L handed.  She notices it when holding a camera.  She also notes cramping in the hands and they will close up.    There is a family hx of tremor in paternal cousins and paternal aunt.  It is independent of time of day or cold/hot weather.  That has been going on 1.5 years.  No feet cramping.    She has been on cymbalta for 5-6 years and doesn't know if it contributes to tremor.  Affected by caffeine:  No. (chai tea, starbucks, daily) Affected by alcohol:  unknown Affected by stress:  No. Affected by fatigue:  No. Spills soup if on spoon:  No. Spills glass of liquid if full:  No. Affects ADL's (tying shoes, brushing teeth, etc):  No.  Current/Previously tried tremor medications: no  Current medications that may exacerbate tremor:  ? cymbalta  07/19/17 update: Patient is seen today for tremor.  I have not seen the patient since 2015.  At that point, the patient had a head tremor and hand tremor.  She did not really notice the head tremor.  She wanted to try medication for hand tremor and was given a prescription for primidone.  She was to follow-up with me, but she never did.  She got dx with breast CA and had other medical issues to deal with.  Primidone caused first dose effect and she didn't want to continue the medication after a single 1/2 tablet.  Pt states that tremor has been getting worbse in the head over the last few months - "I have not noticed the hand at all."  States that she had not noted it really until brother in law said something a few months ago and then  she paid further attention.  Doesn't interfere with ADL's.  No trouble with ROM of the neck.  Doesn't notice any null point.  Doesn't think that stress/emotions play a role into tremor.    Outside reports reviewed: historical medical records, lab reports and referral letter/letters.  Allergies  Allergen Reactions  . Hydrocodone Nausea And Vomiting  . Tramadol Hives and Nausea And Vomiting  . Codeine Nausea And Vomiting  . Iodine Hives  . Radiaplexrx [Skin Protectants, Misc.] Other (See Comments)    Contraindicated on pharmacy profile  . Sulfa Antibiotics Hives  . Tape Other (See Comments)    Redness, skin tear  . Primidone Other (See Comments)    Pt had first dose effect.  More of a known sensitivity and not true allergy    Outpatient Encounter Medications as of 07/19/2017  Medication Sig  . cetirizine (ZYRTEC) 10 MG tablet Take 10 mg by mouth daily.    . fluticasone (FLONASE) 50 MCG/ACT nasal spray   . letrozole (FEMARA) 2.5 MG tablet Take 1 tablet (2.5 mg total) by mouth daily.  Marland Kitchen LORazepam (ATIVAN) 1 MG tablet Take 1 tablet (1 mg total) by mouth every 8 (eight) hours as needed for anxiety (or nausea).  . naproxen (NAPROSYN) 500 MG tablet   .  SUMAtriptan (IMITREX) 50 MG tablet Take 1 tablet (50 mg total) by mouth every 2 (two) hours as needed for migraine. May repeat in 2 hours if headache persists or recurs.  . Vitamin D, Ergocalciferol, (DRISDOL) 50000 UNITS CAPS capsule Take 50,000 Units by mouth every 7 (seven) days.   No facility-administered encounter medications on file as of 07/19/2017.     Past Medical History:  Diagnosis Date  . Allergic rhinitis due to pollen   . Allergy   . Anemia    with pregnancy  . Breast cancer ([place])   . Cough   . Depressive disorder, not elsewhere classified   . GERD (gastroesophageal reflux disease)   . Hot flashes   . Insomnia, unspecified   . Plantar fasciitis   . Radiation 09/09/14-10/17/14   Left chestwall/supraclav.  Marland Kitchen Restless leg  syndrome 09/09/2010  . Skin cancer   . Wears glasses   . Weight gain     Past Surgical History:  Procedure Laterality Date  . COLONOSCOPY    . MASTECTOMY W/ SENTINEL NODE BIOPSY Left 03/07/2014   Procedure: LEFT TOTAL MASTECTOMY WITH LEFT SENTINEL LYMPH NODE BIOPSY;  Surgeon: Rolm Bookbinder, MD;  Location: [facility];  Service: General;  Laterality: Left;  Marland Kitchen MELANOMA EXCISION     right leg, left arm, right arm (several from each area)  . PORT-A-CATH REMOVAL N/A 12/04/2014   Procedure: REMOVAL PORT-A-CATH;  Surgeon: Rolm Bookbinder, MD;  Location: [facility];  Service: General;  Laterality: N/A;  . PORTACATH PLACEMENT Right 04/01/2014   Procedure: INSERTION PORT-A-CATH;  Surgeon: Rolm Bookbinder, MD;  Location: [facility];  Service: General;  Laterality: Right;  . TONSILLECTOMY      Social History   Socioeconomic History  . Marital status: Married    Spouse name: Not on file  . Number of children: 2  . Years of education: Not on file  . Highest education level: Not on file  Occupational History  . Occupation: Scientist, water quality  Social Needs  . Financial resource strain: Not on file  . Food insecurity:    Worry: Not on file    Inability: Not on file  . Transportation needs:    Medical: Not on file    Non-medical: Not on file  Tobacco Use  . Smoking status: Former Smoker    Packs/day: 0.20    Years: 6.00    Pack years: 1.20    Types: Cigarettes    Last attempt to quit: 01/25/1974    Years since quitting: 43.5  . Smokeless tobacco: Never Used  Substance and Sexual Activity  . Alcohol use: Yes    Alcohol/week: 1.2 oz    Types: 2 Glasses of wine per week    Comment: 5 glasses per month  . Drug use: No  . Sexual activity: Never  Lifestyle  . Physical activity:    Days per week: Not on file    Minutes per session: Not on file  . Stress: Not on file  Relationships  . Social connections:    Talks on phone: Not on file    Gets together:  Not on file    Attends religious service: Not on file    Active member of club or organization: Not on file    Attends meetings of clubs or organizations: Not on file    Relationship status: Not on file  . Intimate partner violence:    Fear of current or ex partner: Not on file  Emotionally abused: Not on file    Physically abused: Not on file    Forced sexual activity: Not on file  Other Topics Concern  . Not on file  Social History Narrative  . Not on file    Family Status  Relation Name Status  . Father  Deceased       ETOH  . Mother  Deceased       heart disease, emphysema  . Sister  Alive       asthma  . Sister  Alive       healthy  . Brother  Alive       healthy  . Daughter  Alive       healthy  . Daughter  Alive       thyroid disease  . Daughter  (Not Specified)  . Other  (Not Specified)    Review of Systems Review of Systems  Constitutional: Negative.   HENT: Negative.   Eyes: Negative.   Respiratory:       Some DOE  Cardiovascular: Negative.   Gastrointestinal: Negative.   Genitourinary: Negative.   Musculoskeletal: Negative.   Skin: Negative.   Neurological: Negative.   Endo/Heme/Allergies: Bruises/bleeds easily.  Psychiatric/Behavioral: Negative.       Objective:   VITALS:   Vitals:   07/19/17 0848  BP: 112/82  Pulse: 72  SpO2: 98%  Weight: 162 lb (73.5 kg)  Height: 5\' 5"  (1.651 m)    Gen:  Appears stated age and in NAD. HEENT:  Normocephalic, atraumatic. The mucous membranes are moist. The superficial temporal arteries are without ropiness or tenderness. Cardiovascular: Regular rate and rhythm. Lungs: Clear to auscultation bilaterally. Neck: There are no carotid bruits noted bilaterally.  NEUROLOGICAL:  Orientation:  The patient is alert and oriented x 3.  Recent and remote memory are intact.  Attention span and concentration are normal.  Able to name objects and repeat without trouble.  Fund of knowledge is appropriate Cranial  nerves: There is good facial symmetry. The pupils are equal round and reactive to light bilaterally. Fundoscopic exam reveals clear disc margins bilaterally. Extraocular muscles are intact and visual fields are full to confrontational testing. Speech is fluent and clear. Soft palate rises symmetrically and there is no tongue deviation. Hearing is intact to conversational tone. Tone: Tone is good throughout. Sensation: Sensation is intact to light touch and pinprick throughout (facial, trunk, extremities). Vibration is intact at the bilateral big toe. There is no extinction with double simultaneous stimulation. There is no sensory dermatomal level identified. Coordination:  The patient has no difficulty with RAM's or FNF bilaterally. Motor: Strength is 5/5 in the bilateral upper and lower extremities.  Shoulder shrug is equal bilaterally.  There is no pronator drift.  There are no fasciculations noted. DTR's: Deep tendon reflexes are 2/4 at the bilateral biceps, triceps, brachioradialis, patella and achilles.  Plantar responses are downgoing bilaterally. Gait and Station: The patient is able to ambulate without difficulty.  Movement:  Minimal tremor of outstretched hands.  she has no difficulty with archimedes spirals.  There is head tremor in the "no" direction.  Better when head is turned L or R than neutral    Labs: Lab work is reviewed and dated Jun 03, 2017.  White blood cells are 6.0, hemoglobin 13.0, hematocrit 39.1 and platelets 256.  Sodium is 142, potassium 4.5, chloride 104, CO2 31, BUN 14 and creatinine 0.72.  TSH was 1.48.  AST 13, ALT 11, alkaline phosphatase 83.  Assessment/Plan:   1.  Head tremor  -not as convinced that head tremor is cervical dystonia as just ET.  It could be and offered to try to inject R SCM, L spenius but after hearing that it is more than one injection and r/b/se, she decided to hold off  -She and I talked about the fact that head tremor is fairly resistant to  any type of medication.  Sometimes, low-dose clonazepam can help.  She is already on Ativan at night for sleep.  It may be of benefit to change her nighttime Ativan to clonazepam, 0.5 mg, and then the use 0.25 mg in the day and see if she can tolerate it for head tremor.  She will talk to her primary care physician about this.  I really did not want to change a medication that someone else was prescribing. 2.  She will follow-up with me on an as-needed basis.

## 2017-07-19 ENCOUNTER — Encounter: Payer: Self-pay | Admitting: Neurology

## 2017-07-19 ENCOUNTER — Ambulatory Visit: Payer: 59 | Admitting: Neurology

## 2017-07-19 VITALS — BP 112/82 | HR 72 | Ht 65.0 in | Wt 162.0 lb

## 2017-07-19 DIAGNOSIS — G250 Essential tremor: Secondary | ICD-10-CM | POA: Insufficient documentation

## 2017-07-21 ENCOUNTER — Encounter: Payer: Self-pay | Admitting: Podiatry

## 2017-07-21 ENCOUNTER — Ambulatory Visit: Payer: 59 | Admitting: Podiatry

## 2017-07-21 DIAGNOSIS — M722 Plantar fascial fibromatosis: Secondary | ICD-10-CM

## 2017-07-21 NOTE — Patient Instructions (Signed)

## 2017-07-24 NOTE — Progress Notes (Signed)
She presents today for follow-up of capsulitis the left foot states that it was good for about 3 weeks and then started hurting again.  Objective: Vital signs are stable she is alert and oriented x3.  Pulses are palpable.  She still has pain along the fourth fifth tarsometatarsal joint however today on physical exam is noticed that she does have pain on palpation medial calcaneal tubercle of the left heel.  This is significant for plantar fasciitis.  Assessment: Plantar fasciitis with lateral compensatory syndrome.  Plan: Discussed etiology pathology conservative versus surgical therapy after sterile Betadine skin prep I injected 20 mg Kenalog 5 mg Marcaine medial aspect of the left heel at the point of maximal tenderness.  Dispensed a plantar fascial brace discussed appropriate shoe gear stretching exercise ice therapy sugar modifications will continue naproxen.

## 2017-08-04 DIAGNOSIS — G4700 Insomnia, unspecified: Secondary | ICD-10-CM | POA: Diagnosis not present

## 2017-08-04 DIAGNOSIS — R251 Tremor, unspecified: Secondary | ICD-10-CM | POA: Diagnosis not present

## 2017-08-23 ENCOUNTER — Ambulatory Visit (INDEPENDENT_AMBULATORY_CARE_PROVIDER_SITE_OTHER): Payer: BLUE CROSS/BLUE SHIELD | Admitting: Podiatry

## 2017-08-23 ENCOUNTER — Encounter: Payer: Self-pay | Admitting: Podiatry

## 2017-08-23 DIAGNOSIS — M722 Plantar fascial fibromatosis: Secondary | ICD-10-CM | POA: Diagnosis not present

## 2017-08-23 NOTE — Progress Notes (Signed)
Presents today for follow-up left heel is better but there is some still pain present.  Objective: Vital signs are stable alert and oriented x3.  Pulses are palpable.  There is still mild tenderness on palpation medial calcaneal tubercle of the left heel.  Her graph assessment: Plantar fasciitis left.  Slowly resolving.  Plan: Injected left heel today with 20 mg Kenalog 5 mg Marcaine point maximal tenderness left.  Tolerated procedure well no complications.  Continue conservative therapies plantar pressure brace night splint and anti-inflammatories.  Follow-up with me in 6 weeks

## 2017-08-31 ENCOUNTER — Encounter: Payer: Self-pay | Admitting: Podiatry

## 2017-09-13 ENCOUNTER — Ambulatory Visit: Payer: BLUE CROSS/BLUE SHIELD

## 2017-09-13 ENCOUNTER — Ambulatory Visit (INDEPENDENT_AMBULATORY_CARE_PROVIDER_SITE_OTHER): Payer: BLUE CROSS/BLUE SHIELD

## 2017-09-13 ENCOUNTER — Ambulatory Visit: Payer: BLUE CROSS/BLUE SHIELD | Admitting: Podiatry

## 2017-09-13 DIAGNOSIS — M2041 Other hammer toe(s) (acquired), right foot: Secondary | ICD-10-CM | POA: Diagnosis not present

## 2017-09-13 NOTE — Patient Instructions (Signed)
Pre-Operative Instructions  Congratulations, you have decided to take an important step towards improving your quality of life.  You can be assured that the doctors and staff at [facility] will be with you every step of the way.  Here are some important things you should know:  1. Plan to be at the surgery center/hospital at least 1 (one) hour prior to your scheduled time, unless otherwise directed by the surgical center/hospital staff.  You must have a responsible adult accompany you, remain during the surgery and drive you home.  Make sure you have directions to the surgical center/hospital to ensure you arrive on time. 2. If you are having surgery at [facility] or [facility] hospitals, you will need a copy of your medical history and physical form from your family physician within one month prior to the date of surgery. We will give you a form for your primary physician to complete.  3. We make every effort to accommodate the date you request for surgery.  However, there are times where surgery dates or times have to be moved.  We will contact you as soon as possible if a change in schedule is required.   4. No aspirin/ibuprofen for one week before surgery.  If you are on aspirin, any non-steroidal anti-inflammatory medications (Mobic, Aleve, Ibuprofen) should not be taken seven (7) days prior to your surgery.  You make take Tylenol for pain prior to surgery.  5. Medications - If you are taking daily heart and blood pressure medications, seizure, reflux, allergy, asthma, anxiety, pain or diabetes medications, make sure you notify the surgery center/hospital before the day of surgery so they can tell you which medications you should take or avoid the day of surgery. 6. No food or drink after midnight the night before surgery unless directed otherwise by surgical center/hospital staff. 7. No alcoholic beverages 24-hours prior to surgery.  No smoking 24-hours prior or 24-hours after  surgery. 8. Wear loose pants or shorts. They should be loose enough to fit over bandages, boots, and casts. 9. Don't wear slip-on shoes. Sneakers are preferred. 10. Bring your boot with you to the surgery center/hospital.  Also bring crutches or a walker if your physician has prescribed it for you.  If you do not have this equipment, it will be provided for you after surgery. 11. If you have not been contacted by the surgery center/hospital by the day before your surgery, call to confirm the date and time of your surgery. 12. Leave-time from work may vary depending on the type of surgery you have.  Appropriate arrangements should be made prior to surgery with your employer. 13. Prescriptions will be provided immediately following surgery by your doctor.  Fill these as soon as possible after surgery and take the medication as directed. Pain medications will not be refilled on weekends and must be approved by the doctor. 14. Remove nail polish on the operative foot and avoid getting pedicures prior to surgery. 15. Wash the night before surgery.  The night before surgery wash the foot and leg well with water and the antibacterial soap provided. Be sure to pay special attention to beneath the toenails and in between the toes.  Wash for at least three (3) minutes. Rinse thoroughly with water and dry well with a towel.  Perform this wash unless told not to do so by your physician.  Enclosed: 1 Ice pack (please put in freezer the night before surgery)   1 Hibiclens skin cleaner     Pre-op instructions  If you have any questions regarding the instructions, please do not hesitate to call our office.  [place]: [address], [place], [place] [address] -- [phone]  [place]: [address] [place], [place] [address] -- [phone]  [place]: [address]  [place], [place] [address] -- [phone]  [place]: [address], [address], [place],  [address] -- [phone]  Website:  [email or web address] 

## 2017-09-13 NOTE — Progress Notes (Signed)
She presents today to discuss surgical intervention regarding her third toe right foot.  She states that is becoming more painful as time goes on and she would like to consider surgical correction.  Objective: Vital signs are stable alert and oriented x3.  Pulses are palpable.  Painful thick modulated PIPJ area particularly with plantar protuberance at the level of the PIPJ is painful on palpation.  Radiographic evaluation demonstrates what appears to be a telescoping dislocation of the PIPJ with the intermediate phalanx plantarly.  Assessment: Digital deformity third digit right foot.  Plan: Consider her today for surgical intervention regarding a distraction of the joint with most likely an arthrodesis PIPJ third digit right foot.  We discussed the pros and cons of the surgery as well as alternatives.  We discussed possible complications which may include but are not limited to postop pain bleeding swelling infection recurrence need for further surgery overcorrection under correction.  Dispensed a Darco shoe for recovery dispensed both oral and written home-going instruction for her preop instructions as well as the surgery center and anesthesia group.  I will follow-up with her in the near future for surgical intervention.

## 2017-10-06 ENCOUNTER — Ambulatory Visit: Payer: Self-pay | Admitting: Podiatry

## 2017-10-13 ENCOUNTER — Telehealth: Payer: Self-pay | Admitting: *Deleted

## 2017-10-13 NOTE — Telephone Encounter (Signed)
"  I'm calling to see what time I need to arrive for my surgery.  I'm scheduled to have it done on October 11."  Someone from the surgery center will call you a day or two prior to your surgery date.  They will give you your arrival time.  The reason I can't give you a time is because they may have cancellations or have diabetic patients/children that have to go first.  "Oh okay, they will call a day or two before.  Thank you."

## 2017-10-24 ENCOUNTER — Other Ambulatory Visit: Payer: Self-pay | Admitting: Dermatology

## 2017-10-24 DIAGNOSIS — C4372 Malignant melanoma of left lower limb, including hip: Secondary | ICD-10-CM | POA: Diagnosis not present

## 2017-10-24 DIAGNOSIS — D0372 Melanoma in situ of left lower limb, including hip: Secondary | ICD-10-CM | POA: Diagnosis not present

## 2017-10-31 ENCOUNTER — Telehealth: Payer: Self-pay | Admitting: Medical Oncology

## 2017-10-31 NOTE — Telephone Encounter (Signed)
[name]: 18 month follow-up call. Call to patient, for 18 month follow-up on study. Patient confirms to be doing well. Patient denies having any issues or concerns and states she has nothing new to report. Patient had yearly mammogram in January 2019 and confirmed with her that next one will be in January of 2020. Patient confirms to be taking her letrozole daily as instructed. Patient with no SAE's or new anti-cancer medications. Patient did inquire about having BRCA testing done, I informed patient that I will send Dr. Lindi Adie this message and his nurse will follow-up with her regarding an appointment for this. All patient's questions answered to her satisfaction, patient denies further questions at this time. Patient was thanked for her time and continued support of study and encouraged to call with questions.  Adele Dan, RN, BSN Clinical Research 10/31/2017 3:41 PM

## 2017-11-01 DIAGNOSIS — G8929 Other chronic pain: Secondary | ICD-10-CM | POA: Diagnosis not present

## 2017-11-01 DIAGNOSIS — M545 Low back pain: Secondary | ICD-10-CM | POA: Diagnosis not present

## 2017-11-02 ENCOUNTER — Other Ambulatory Visit: Payer: Self-pay | Admitting: Podiatry

## 2017-11-02 MED ORDER — CEPHALEXIN 500 MG PO CAPS: 500.0000 mg | ORAL_CAPSULE | Freq: Three times a day (TID) | ORAL | 0 refills | Status: DC

## 2017-11-03 ENCOUNTER — Encounter: Payer: Self-pay | Admitting: Podiatry

## 2017-11-04 ENCOUNTER — Encounter: Payer: Self-pay | Admitting: Podiatry

## 2017-11-04 DIAGNOSIS — M2041 Other hammer toe(s) (acquired), right foot: Secondary | ICD-10-CM | POA: Diagnosis not present

## 2017-11-04 DIAGNOSIS — Z01818 Encounter for other preprocedural examination: Secondary | ICD-10-CM | POA: Diagnosis not present

## 2017-11-07 ENCOUNTER — Telehealth: Payer: Self-pay | Admitting: Podiatry

## 2017-11-07 DIAGNOSIS — M2041 Other hammer toe(s) (acquired), right foot: Secondary | ICD-10-CM

## 2017-11-07 DIAGNOSIS — Z9889 Other specified postprocedural states: Secondary | ICD-10-CM

## 2017-11-07 NOTE — Telephone Encounter (Signed)
Emailed order to Theresa Huff, and faxed to [facility].

## 2017-11-07 NOTE — Telephone Encounter (Signed)
I called pt and she asked how she could tell if her foot was swollen when she still has the dressing on. I told pt often the surgical foot would feel tight or throb or the exposed toes would appear swollen. Pt states her toe does look some swollen. I told pt that she could remove the boot, open-ended sock, ace wrap and elevate the foot for 15 minutes, then place the foot level and wrap the ace looser beginning at the toes rolling up the leg, then reapply sock and boot. Pt asked if she could take a shower and I told her she could if she kept the area dry. Pt asked if she could drive. I told pt that if she was in any kind of surgical boot, shoe or brace she could not drive in [place].

## 2017-11-07 NOTE — Addendum Note (Signed)
Addended by: Harriett Sine D on: 11/07/2017 01:51 PM   Modules accepted: Orders

## 2017-11-07 NOTE — Telephone Encounter (Signed)
I had surgery on Friday with Dr. Milinda Pointer and I have a few questions. Please call me back at [phone]. Thank you.

## 2017-11-08 ENCOUNTER — Telehealth: Payer: Self-pay | Admitting: Podiatry

## 2017-11-08 NOTE — Telephone Encounter (Signed)
Per Dr. Nigel Mormon doesn't need an alternative antibiotic and Delydia sent a message to [facility] about the knee scooter. Order was put in yesterday, so may take 24-48 hours to get it. Relayed info to patient.

## 2017-11-08 NOTE — Telephone Encounter (Signed)
I had surgery on Friday with Dr. Milinda Pointer and he prescribed me keflex. I'm having an allergic reaction to it, so we need to figure out something else. Also, I have not heard anything from [facility] about the knee scooter. My number is [phone].

## 2017-11-09 ENCOUNTER — Other Ambulatory Visit: Payer: Self-pay | Admitting: Student

## 2017-11-09 DIAGNOSIS — G8929 Other chronic pain: Secondary | ICD-10-CM

## 2017-11-09 DIAGNOSIS — M545 Low back pain, unspecified: Secondary | ICD-10-CM

## 2017-11-10 ENCOUNTER — Ambulatory Visit (INDEPENDENT_AMBULATORY_CARE_PROVIDER_SITE_OTHER): Payer: BLUE CROSS/BLUE SHIELD

## 2017-11-10 VITALS — BP 123/83 | HR 81 | Temp 98.4°F

## 2017-11-10 DIAGNOSIS — M2041 Other hammer toe(s) (acquired), right foot: Secondary | ICD-10-CM

## 2017-11-10 DIAGNOSIS — Z9889 Other specified postprocedural states: Secondary | ICD-10-CM

## 2017-11-14 ENCOUNTER — Ambulatory Visit
Admission: RE | Admit: 2017-11-14 | Discharge: 2017-11-14 | Disposition: A | Discharge status: Home / Self Care | Payer: BLUE CROSS/BLUE SHIELD | Source: Ambulatory Visit | Attending: Student | Admitting: Student

## 2017-11-14 DIAGNOSIS — M545 Low back pain, unspecified: Secondary | ICD-10-CM

## 2017-11-14 DIAGNOSIS — G8929 Other chronic pain: Secondary | ICD-10-CM

## 2017-11-15 DIAGNOSIS — L905 Scar conditions and fibrosis of skin: Secondary | ICD-10-CM | POA: Diagnosis not present

## 2017-11-15 DIAGNOSIS — D0372 Melanoma in situ of left lower limb, including hip: Secondary | ICD-10-CM | POA: Diagnosis not present

## 2017-11-17 ENCOUNTER — Ambulatory Visit (INDEPENDENT_AMBULATORY_CARE_PROVIDER_SITE_OTHER): Payer: BLUE CROSS/BLUE SHIELD | Admitting: Podiatry

## 2017-11-17 ENCOUNTER — Encounter: Payer: Self-pay | Admitting: Podiatry

## 2017-11-17 DIAGNOSIS — M2041 Other hammer toe(s) (acquired), right foot: Secondary | ICD-10-CM

## 2017-11-17 DIAGNOSIS — Z9889 Other specified postprocedural states: Secondary | ICD-10-CM

## 2017-11-17 NOTE — Progress Notes (Signed)
Patient is here today for follow-up appointment surgery performed: Hammertoe repair right foot date of surgery 11/04/2017.  She states that her pain is manageable at this time and she is only using Aleve for pain relief.  Noted well-healing surgical sites, no erythema, no redness, no drainage, all sutures are intact.  No other signs and symptoms of infection.  Patient denies fever chills nausea vomiting, vital signs are stable blood pressure 123/83, temperature 98.4.  Discussed signs and symptoms of infection importance of reporting signs.  She is to continue wearing her boot or surgical shoe, she is to follow-up next week or sooner if acute symptoms arise.  Patient was also evaluated by Dr. Milinda Pointer, x-rays were reviewed and discussed with patient in the room with the doctor.

## 2017-11-18 NOTE — Progress Notes (Signed)
Subjective:   [name], female   DOB: 67 y.o.   MRN: [id number]   HPI Patient states doing well with digit in good alignment pin in place and stitches intact   ROS      Objective:  Physical Exam  Neurovascular status intact with patient's third digit healed well wound edges well coapted pin in place with good alignment of the digit     Assessment:  Doing well post digital procedure third right with good alignment and fixation     Plan:  Sterile dressing reapplied and reappoint for suture removal or earlier if any issues were to occur and continue immobilization elevation compression

## 2017-11-24 ENCOUNTER — Ambulatory Visit (INDEPENDENT_AMBULATORY_CARE_PROVIDER_SITE_OTHER): Payer: BLUE CROSS/BLUE SHIELD | Admitting: Podiatry

## 2017-11-24 ENCOUNTER — Encounter: Payer: Self-pay | Admitting: Podiatry

## 2017-11-24 DIAGNOSIS — M2041 Other hammer toe(s) (acquired), right foot: Secondary | ICD-10-CM

## 2017-11-24 DIAGNOSIS — M7751 Other enthesopathy of right foot: Secondary | ICD-10-CM

## 2017-11-24 DIAGNOSIS — Z9889 Other specified postprocedural states: Secondary | ICD-10-CM

## 2017-11-24 NOTE — Progress Notes (Signed)
She presents today for postop visit date of surgery 11/04/2017 status post hammertoe repair third right with K wire.  States that she is doing pretty well but the toe is swollen.  States that she is ready to have the stitches removed.  Objective: Vital signs are stable alert and oriented x3.  Pulses are palpable.  Neurologic sensorium is intact.  Degenerative flexors are intact.  Sutures are intact margins well coapted.  Sutures were removed today margins remain well coapted K wire is still in place to the toe.  Assessment: Well-healing surgical toe.  Plan: Dressed today and co-band.  We will follow-up with her in 2 to 3 weeks for another set of x-rays

## 2017-12-08 ENCOUNTER — Encounter: Payer: Self-pay | Admitting: Podiatry

## 2017-12-08 ENCOUNTER — Ambulatory Visit (INDEPENDENT_AMBULATORY_CARE_PROVIDER_SITE_OTHER): Payer: BLUE CROSS/BLUE SHIELD

## 2017-12-08 ENCOUNTER — Ambulatory Visit (INDEPENDENT_AMBULATORY_CARE_PROVIDER_SITE_OTHER): Payer: Self-pay | Admitting: Podiatry

## 2017-12-08 DIAGNOSIS — M2041 Other hammer toe(s) (acquired), right foot: Secondary | ICD-10-CM

## 2017-12-08 DIAGNOSIS — Z9889 Other specified postprocedural states: Secondary | ICD-10-CM

## 2017-12-11 NOTE — Progress Notes (Signed)
She presents today for follow-up of her third digital surgery.  Date of surgery was November 04, 2017.  K wire is intact to the toe she denies fever chills nausea vomiting muscle aches pains.  A little bit of pain to the toe otherwise not too bad.  Objective: Vital signs are stable she is alert and oriented x3.  Pulses are palpable.  K wires present to the third toe of the right foot but radiographs demonstrate that the wire is been pushed in by a little bit.  So I removed the K wire from the metatarsal phalangeal joint.  Still leaving it in the toe and I dressed the toe in a particular way that the K wire should not move again.  Assessment: Well-healing surgical toe.  Plan: We will leave the K wire in for another couple of weeks just to make sure that this goes on to heal the way that we wanted to.  Then I will remove the K wire hopefully before Thanksgiving.

## 2017-12-12 DIAGNOSIS — G8929 Other chronic pain: Secondary | ICD-10-CM | POA: Diagnosis not present

## 2017-12-16 ENCOUNTER — Encounter: Payer: Self-pay | Admitting: Hematology and Oncology

## 2017-12-20 ENCOUNTER — Ambulatory Visit (INDEPENDENT_AMBULATORY_CARE_PROVIDER_SITE_OTHER): Payer: BLUE CROSS/BLUE SHIELD

## 2017-12-20 ENCOUNTER — Other Ambulatory Visit: Payer: Self-pay | Admitting: Podiatry

## 2017-12-20 ENCOUNTER — Ambulatory Visit (INDEPENDENT_AMBULATORY_CARE_PROVIDER_SITE_OTHER): Payer: BLUE CROSS/BLUE SHIELD | Admitting: Podiatry

## 2017-12-20 ENCOUNTER — Other Ambulatory Visit: Payer: Self-pay

## 2017-12-20 ENCOUNTER — Telehealth: Payer: Self-pay | Admitting: Hematology and Oncology

## 2017-12-20 DIAGNOSIS — Z9889 Other specified postprocedural states: Secondary | ICD-10-CM

## 2017-12-20 DIAGNOSIS — M2041 Other hammer toe(s) (acquired), right foot: Secondary | ICD-10-CM

## 2017-12-20 DIAGNOSIS — C50412 Malignant neoplasm of upper-outer quadrant of left female breast: Secondary | ICD-10-CM

## 2017-12-20 NOTE — Telephone Encounter (Signed)
Scheduled appt per 11/26 sch messag e- pt is aware of appt date and time.

## 2017-12-20 NOTE — Progress Notes (Signed)
Ok per Dr.Gudena to send genetics referral, per pt request.

## 2017-12-21 NOTE — Progress Notes (Signed)
She presents for a postop visit date of surgery 11/04/2017 repair arthritic toe with K wire third toe right foot.  Denies fever chills nausea vomiting muscle aches and pains states that is been doing just fine.  Objective: Vital signs are stable she is alert and oriented x3.  Pulses are palpable.  There is no erythema edema cellulitis drainage or odor.  K wires intact.  Radiographs taken today demonstrate what appears to be complete consolidation of the PIPJ.  And the K wire was removed.  There is minimal discomfort with the removal of the K wire she tolerated well.  Assessment: Well-healing PIPJ arthrodesis.  Plan: On a follow-up with her in about 1 month.  I am allow her to get back into regular shoe gear.  She will continue to wrap the toe for at least another month.  This will just help provide provide support and keep the swelling to a minimum

## 2017-12-27 ENCOUNTER — Encounter: Payer: Self-pay | Admitting: Genetics

## 2017-12-27 ENCOUNTER — Inpatient Hospital Stay: Payer: BLUE CROSS/BLUE SHIELD

## 2017-12-27 ENCOUNTER — Inpatient Hospital Stay: Payer: BLUE CROSS/BLUE SHIELD | Attending: Genetic Counselor | Admitting: Genetics

## 2017-12-27 DIAGNOSIS — Z7183 Encounter for nonprocreative genetic counseling: Secondary | ICD-10-CM | POA: Diagnosis not present

## 2017-12-27 DIAGNOSIS — Z87891 Personal history of nicotine dependence: Secondary | ICD-10-CM

## 2017-12-27 DIAGNOSIS — Z170 Estrogen receptor positive status [ER+]: Secondary | ICD-10-CM

## 2017-12-27 DIAGNOSIS — Z808 Family history of malignant neoplasm of other organs or systems: Secondary | ICD-10-CM | POA: Insufficient documentation

## 2017-12-27 DIAGNOSIS — C50412 Malignant neoplasm of upper-outer quadrant of left female breast: Secondary | ICD-10-CM | POA: Diagnosis not present

## 2017-12-27 DIAGNOSIS — Z86006 Personal history of melanoma in-situ: Secondary | ICD-10-CM | POA: Diagnosis not present

## 2017-12-27 DIAGNOSIS — C439 Malignant melanoma of skin, unspecified: Secondary | ICD-10-CM | POA: Diagnosis not present

## 2017-12-27 DIAGNOSIS — Z8481 Family history of carrier of genetic disease: Secondary | ICD-10-CM | POA: Diagnosis not present

## 2017-12-27 HISTORY — DX: Malignant melanoma of skin, unspecified: C43.9

## 2017-12-27 NOTE — Progress Notes (Signed)
REFERRING PROVIDER: Nicholas Lose, MD [address] [place], [place] [address]  PRIMARY PROVIDER:  Antony Contras, MD  PRIMARY REASON FOR VISIT:  1. Malignant neoplasm of upper-outer quadrant of left female breast, unspecified estrogen receptor status ([place])   2. Family history of melanoma   3. Personal history of melanoma in-situ   4. Malignant neoplasm of upper-outer quadrant of left breast in female, estrogen receptor positive ([place])     HISTORY OF PRESENT ILLNESS:   Theresa Huff, a 67 y.o. female, was seen for a [place] cancer genetics consultation at the request of Dr. Lindi Adie due to a personal and family history of cancer.  Theresa Huff presents to clinic today to discuss the possibility of a hereditary predisposition to cancer, genetic testing, and to further clarify her future cancer risks, as well as potential cancer risks for family members.   In 2016, at the age of 61, Theresa Huff was diagnosed with Invasive ductal carcinoma ER/PR+, HER2-.  She had chemotherapy, radiation, and antiestrogen therapy.  Theresa Huff also reports an extensive history of melanoma.  She was dx with her first melanoma at 86 and has had 6 total.  Mostly on her arms and legs.    CANCER HISTORY:    Breast cancer of upper-outer quadrant of left female breast ([place])   01/30/2014 Mammogram    Left breast: 2.4 cm irregular high density mass with indistinct margin, middle depth, 6.8 cm from nipple, with architectural distortion. There is also a 3.5 cm irregular high density asymmetry at 1:00, anterior depth, with arch distortion and thickening.    01/30/2014 Breast US    Left breast: 3.7 cm irregular mass with indistinct margin at 2:00, posterior deth, 4 cm from nipple, hypoechoic. 10 x 1 cm irregular mass with lobulated and indistinct margin at 3:00, posterior depth. A third 0.9 cm lobulated mass at 1:00, posterior depth    02/04/2014 Initial Biopsy    Left breast core needle bx x 3: Invasive ductal  carcinoma with DCIS ER 100%, PR 100%, HER-2 negative, Ki-67 ranged from 19-26%; third biopsy showed invasive mammary cancer with lobular features and intraductal papilloma, ER/PR positive HER-2 negative    02/11/2014 Breast MRI    Left breast: 3 masses that are confluent measuring 5.6 x 2.7 x 3 cm extending to the left aspect of the areola, appearing to involve the skin of theareola, deviating the nipple towards the left.  No axillary findings.  Neg rightt breast.    02/13/2014 Clinical Stage    Stage IIB: T3 N0    03/07/2014 Definitive Surgery    Left mastectomy/SLNB: invasive ductal carcinoma grade 1; 4.8 cm with low-grade DCIS; margins negative 1/4 lymph nodes positive, ER 100% PR 100% HER-2 negative ratio 1.18 Ki-67 26% and 19%     03/07/2014 Pathologic Stage    Stage IIB (T2 N1)    04/03/2014 - 08/13/2014 Chemotherapy    Adjuvant chemotherapy with dose dense adriamycin and cytoxan followed by weekly abraxane 12    09/09/2014 - 10/17/2014 Radiation Therapy    Adjuvant RT [name]): Left chest wall 50.4 Gy over 28 fractions. Left supraclavicular fossa and axilla: 45 Gy over 25 fractions    10/27/2014 -  Anti-estrogen oral therapy    Anastrozole 1 mg daily plus Ibrance ([name] Clinical Trial stopped 06/11/16)    01/16/2015 Survivorship    Survivorship visit completed and copy of care plan provided to patient.     HORMONAL RISK FACTORS:  First live birth at age 43.  Ovaries intact: yes.  Hysterectomy: no.  Menopausal status: postmenopausal.  Colonoscopy: yes; reportedly normal, due for next soon. has every 10 years.  Past Medical History:  Diagnosis Date  . Allergic rhinitis due to pollen   . Allergy   . Anemia    with pregnancy  . Breast cancer ([place])   . Cough   . Depressive disorder, not elsewhere classified   . Family history of melanoma   . GERD (gastroesophageal reflux disease)   . Hot flashes   . Insomnia, unspecified   . Personal history of melanoma in-situ   .  Plantar fasciitis   . Radiation 09/09/14-10/17/14   Left chestwall/supraclav.  Marland Kitchen Restless leg syndrome 09/09/2010  . Skin cancer   . Wears glasses   . Weight gain     Past Surgical History:  Procedure Laterality Date  . COLONOSCOPY    . MASTECTOMY W/ SENTINEL NODE BIOPSY Left 03/07/2014   Procedure: LEFT TOTAL MASTECTOMY WITH LEFT SENTINEL LYMPH NODE BIOPSY;  Surgeon: Rolm Bookbinder, MD;  Location: [facility];  Service: General;  Laterality: Left;  Marland Kitchen MELANOMA EXCISION     right leg, left arm, right arm (several from each area)  . PORT-A-CATH REMOVAL N/A 12/04/2014   Procedure: REMOVAL PORT-A-CATH;  Surgeon: Rolm Bookbinder, MD;  Location: [facility];  Service: General;  Laterality: N/A;  . PORTACATH PLACEMENT Right 04/01/2014   Procedure: INSERTION PORT-A-CATH;  Surgeon: Rolm Bookbinder, MD;  Location: [facility];  Service: General;  Laterality: Right;  . TONSILLECTOMY      Social History   Socioeconomic History  . Marital status: Married    Spouse name: Legrand Como  . Number of children: 2  . Years of education: Not on file  . Highest education level: Some college, no degree  Occupational History  . Occupation: Aeronautical engineer: [employer]  Social Needs  . Financial resource strain: Not on file  . Food insecurity:    Worry: Not on file    Inability: Not on file  . Transportation needs:    Medical: Not on file    Non-medical: Not on file  Tobacco Use  . Smoking status: Former Smoker    Packs/day: 0.20    Years: 6.00    Pack years: 1.20    Types: Cigarettes    Last attempt to quit: 01/25/1974    Years since quitting: 43.9  . Smokeless tobacco: Never Used  Substance and Sexual Activity  . Alcohol use: Yes    Alcohol/week: 2.0 standard drinks    Types: 2 Glasses of wine per week    Comment: 2 glasses wine per week  . Drug use: No  . Sexual activity: Never  Lifestyle  . Physical activity:    Days per  week: Not on file    Minutes per session: Not on file  . Stress: Not on file  Relationships  . Social connections:    Talks on phone: Not on file    Gets together: Not on file    Attends religious service: Not on file    Active member of club or organization: Not on file    Attends meetings of clubs or organizations: Not on file    Relationship status: Not on file  Other Topics Concern  . Not on file  Social History Narrative   Patient is left-handed. She lives with her husband in a 2 story house. She drinks 1 glass of tea a day.  She does not exercise.     FAMILY HISTORY:  We obtained a detailed, 4-generation family history.  Significant diagnoses are listed below: Family History  Problem Relation Age of Onset  . Alcohol abuse Father   . Liver disease Father   . Emphysema Mother   . Heart disease Mother   . Heart failure Mother   . Melanoma Brother 30       more than 3, unk exact number  . Celiac disease Daughter   . Hashimoto's thyroiditis Daughter   . Asthma Other        grandson  . Cancer Cousin        maybe had cancer- type unk    Theresa Huff has 2 daughters ages 68 and 31 with no hx of cancer.  She has 4 grandchildren (ages 9-23) with no hx of cancer.  Theresa Huff has a brother who is 22 and has a history of several melanomas.  She reports his first was dx in his 68's and he has had several since, more than 3, but she doesn't know the exact count. Theresa Huff also has 2 sisters ages 72 and 92 with no hx of cancer.   Theresa Huff father: died in his 49's due to liver disease. Paternal Aunts/Uncles: 1 paternal aunt with no hx of cancer.  Paternal cousins: 1 cousin might have had cancer? Paternal grandfather: unk Paternal grandmother:unk  Theresa Huff's mother: died in her late 77's due to heart disease and emphysema.  She had a breast lump in her breast (unk any other details) in her 40's/50's.  Maternal Aunts/Uncles: none, mother was only child Maternal cousins:  N/A Maternal grandfather: unk Maternal grandmother:died in her 32's, unk if any cancer  Theresa Huff is unaware of previous family history of genetic testing for hereditary cancer risks. Patient's maternal ancestors are of N. European descent, and paternal ancestors are of N. European descent. There is no reported Ashkenazi Jewish ancestry. There is no known consanguinity.  GENETIC COUNSELING ASSESSMENT: Theresa Huff is a 67 y.o. female with a personal and family history which is somewhat suggestive of a Hereditary Cancer Predisposition Syndrome. We, therefore, discussed and recommended the following at today's visit.   DISCUSSION: We reviewed the characteristics, features and inheritance patterns of hereditary cancer syndromes. We also discussed genetic testing, including the appropriate family members to test, the process of testing, insurance coverage and turn-around-time for results. We discussed the implications of a negative, positive and/or variant of uncertain significant result. We recommended Theresa Huff pursue genetic testing for Melanoma gene panel with reflex to the Multi-Cancer Panel.   The Melanoma panel offered by [facility] includes sequencing and/or deletion duplication testing of the following 9 genes: BAP1, BRCA2, BRIP1, CDK4, CDKN2A (p14ARF), CDKN2A (p16INK4a), POT1, PTEN, RB1, and TP53.  The following gene was evaluated for sequence changes only: MITF (c.952G>A, p.GLU318Lys variant only).  The Multi-Cancer Panel offered by [facility] includes sequencing and/or deletion duplication testing of the following 90 genes: AIP, ALK, APC, ATM, AXIN2, BAP1, BARD1, BLM, BMPR1A, BRCA1, BRCA2, BRIP1, BUB1B, CASR, CDC73, CDH1, CDK4, CDKN1B, CDKN1C, CDKN2A, CEBPA, CHEK2, CTNNA1, DICER1, DIS3L2, EGFR, ENG, EPCAM, FH, FLCN, GALNT12, GATA2, GPC3, GREM1, HOXB13, HRAS, KIT, MAX, MEN1, MET, MITF, MLH1, MLH3, MSH2, MSH3, MSH6, MUTYH, NBN, NF1, NF2, NTHL1, PALB2, PDGFRA, PHOX2B, PMS2, POLD1, POLE, POT1, PRKAR1A,  PTCH1, PTEN, RAD50, RAD51C, RAD51D, RB1, RECQL4, RET, RNF43, RPS20, RUNX1, SDHA, SDHAF2, SDHB, SDHC, SDHD, SMAD4, SMARCA4, SMARCB1, SMARCE1, STK11, SUFU, TERC, TERT, TMEM127, TP53, TSC1, TSC2, VHL,  WRN, WT1  We discussed that only 5-10% of cancers are associated with a Hereditary cancer predisposition syndrome.   One of the most common hereditary cancer syndromes that increases breast cancer risk is called Hereditary Breast and Ovarian Cancer (HBOC) syndrome.  This syndrome is caused by mutations in the BRCA1 and BRCA2 genes.  This syndrome increases an individual's lifetime risk to develop breast, ovarian, pancreatic, and other types of cancer.  There are also many other cancer predisposition syndromes caused by mutations in several other genes.  We discussed melanoma predisposition gene mutations as well.   We discussed that if she is found to have a mutation in one of these genes, it may impact future medical management recommendations such as increased cancer screenings and consideration of risk reducing surgeries.  A positive result could also have implications for the patient's family members.  A Negative result would mean we were unable to identify a hereditary component to her cancer, but does not rule out the possibility of a hereditary basis for her cancer.  There could be mutations that are undetectable by current technology, or in genes not yet tested or identified to increase cancer risk.    We discussed the potential to find a Variant of Uncertain Significance or VUS.  These are variants that have not yet been identified as pathogenic or benign, and it is unknown if this variant is associated with increased cancer risk or if this is a normal finding.  Most VUS's are reclassified to benign or likely benign.   It should not be used to make medical management decisions. With time, we suspect the lab will determine the significance of any VUS's identified if any.   Based on Theresa Huff's  personal and family history of cancer, she meets NCCN criteria for genetic testing for melanoma risk. Despite this, she may still have an out of pocket cost. The laboratory can provide her with an estimate of her OOP cost.  she was given the contact information for the laboratory if she has further questions.   PLAN: After considering the risks, benefits, and limitations, Theresa Huff  provided informed consent to pursue genetic testing and the blood sample was sent to [facility] for analysis of the Melanoma Panel reflex Multi-Cancer Panel. Results should be available within approximately 2-3 weeks' time, at which point they will be disclosed by telephone to Theresa Huff, as will any additional recommendations warranted by these results. Theresa Huff will receive a summary of her genetic counseling visit and a copy of her results once available. This information will also be available in Epic. We encouraged Theresa Huff to remain in contact with cancer genetics annually so that we can continuously update the family history and inform her of any changes in cancer genetics and testing that may be of benefit for her family. Theresa Huff questions were answered to her satisfaction today. Our contact information was provided should additional questions or concerns arise.  Based on Ms. Dziedzic's family history, we recommended her borther, who was diagnosed with 3+ melanomas also, have genetic counseling and testing. Ms. Bryden will let us know if we can be of any assistance in coordinating genetic counseling and/or testing for this family member.   Lastly, we encouraged Ms. Dupler to remain in contact with cancer genetics annually so that we can continuously update the family history and inform her of any changes in cancer genetics and testing that may be of benefit for this family.   Ms.  Behrman questions  were answered to her satisfaction today. Our contact information was provided should additional questions  or concerns arise. Thank you for the referral and allowing [place] to share in the care of your patient.   Tana Felts, MS, [facility] Certified Genetic Counselor [email or web address] phone: [phone]  The patient was seen for a total of 35 minutes in face-to-face genetic counseling.  This patient was discussed with Drs. Magrinat, Lindi Adie and/or Burr Medico who agrees with the above.

## 2017-12-29 ENCOUNTER — Telehealth: Payer: Self-pay | Admitting: Genetics

## 2017-12-29 NOTE — Telephone Encounter (Signed)
Patient called to let me know that she spoke with her daughter after our genetics appointment.  Her daughter, Kipp Laurence, reported she had genetic testing years ago that was positive for a mutation in BRCA1.  Ms. Rita does not have the report, but will ask her daughter for it.   We discussed the implications of this new information for her/her family.   I will inform the lab of this update as it may impact their coding/billing.

## 2018-01-11 ENCOUNTER — Telehealth: Payer: Self-pay | Admitting: Genetics

## 2018-01-11 DIAGNOSIS — C50912 Malignant neoplasm of unspecified site of left female breast: Secondary | ICD-10-CM | POA: Diagnosis not present

## 2018-01-13 NOTE — Telephone Encounter (Signed)
Revealed POSITIVE CDKN2A results.  We discussed that this gene is associated with pancreatic cancer and melanoma risk. Routine skin exams are recommended.  She is already being frequently screened by her dermatologist for her melanomas, we discussed the option of pancreatic cancer screening.  It is typically performed when a person has a CDKN2A mutation as well as a faimily history of pancreatic cancer- Theresa Huff does not report any fhx of panc cancer.  Therefore, it may not be appropriate for her, but is something she can think about and discuss further with her doctors.  We did discuss lifestyle factors can limit pancreatic cancer risk (ex no smoking/quit, no excessive alcohol, etc) also we informed her about symptoms of panc cancer to be aware of.    We recommend all of her relatives also have testing for this mutation, as they may benefit from increased screening/prevention.  Theresa Huff protection is recommended for everyone, but especially important for CDKNA  Mutation carriers.   She declined an appointment for follow-up with genetics- she did not have other questions/concerns to discuss.  We will share these with Theresa Huff who can follow-up with her.  We also disclosed her MLH1 VUS.

## 2018-01-27 ENCOUNTER — Ambulatory Visit: Payer: Self-pay | Admitting: Genetics

## 2018-01-27 DIAGNOSIS — Z1501 Genetic susceptibility to malignant neoplasm of breast: Secondary | ICD-10-CM

## 2018-01-27 DIAGNOSIS — Z1379 Encounter for other screening for genetic and chromosomal anomalies: Secondary | ICD-10-CM

## 2018-01-27 DIAGNOSIS — Z1509 Genetic susceptibility to other malignant neoplasm: Secondary | ICD-10-CM | POA: Insufficient documentation

## 2018-01-27 DIAGNOSIS — C50412 Malignant neoplasm of upper-outer quadrant of left female breast: Secondary | ICD-10-CM

## 2018-01-27 DIAGNOSIS — C439 Malignant melanoma of skin, unspecified: Secondary | ICD-10-CM

## 2018-01-27 DIAGNOSIS — Z808 Family history of malignant neoplasm of other organs or systems: Secondary | ICD-10-CM

## 2018-01-27 DIAGNOSIS — Z86006 Personal history of melanoma in-situ: Secondary | ICD-10-CM

## 2018-01-27 DIAGNOSIS — Z170 Estrogen receptor positive status [ER+]: Secondary | ICD-10-CM

## 2018-01-27 NOTE — Progress Notes (Signed)
HPI:  Theresa Huff was previously seen in the [facility] on 12/27/2017 due to a personal and family history of cancer and concerns regarding a hereditary predisposition to cancer. Please refer to our prior cancer genetics clinic note for more information regarding Theresa Huff's medical, social and family histories, and our assessment and recommendations, at the time. Theresa Huff recent genetic test results were disclosed to her, as well as recommendations warranted by these results. These results and recommendations are discussed in more detail below.  CANCER HISTORY:  In 2016, at the age of 30, Theresa Huff was diagnosed with Invasive ductal carcinoma ER/PR+, HER2-.  She had chemotherapy, radiation, and antiestrogen therapy.  Theresa Huff also reports an extensive history of melanoma.  She was dx with her first melanoma at 53 and has had 6 total.  Mostly on her arms and legs.      Breast cancer of upper-outer quadrant of left female breast ([place])   01/30/2014 Mammogram    Left breast: 2.4 cm irregular high density mass with indistinct margin, middle depth, 6.8 cm from nipple, with architectural distortion. There is also a 3.5 cm irregular high density asymmetry at 1:00, anterior depth, with arch distortion and thickening.    01/30/2014 Breast US    Left breast: 3.7 cm irregular mass with indistinct margin at 2:00, posterior deth, 4 cm from nipple, hypoechoic. 10 x 1 cm irregular mass with lobulated and indistinct margin at 3:00, posterior depth. A third 0.9 cm lobulated mass at 1:00, posterior depth    02/04/2014 Initial Biopsy    Left breast core needle bx x 3: Invasive ductal carcinoma with DCIS ER 100%, PR 100%, HER-2 negative, Ki-67 ranged from 19-26%; third biopsy showed invasive mammary cancer with lobular features and intraductal papilloma, ER/PR positive HER-2 negative    02/11/2014 Breast MRI    Left breast: 3 masses that are confluent measuring 5.6 x 2.7 x 3 cm extending to the  left aspect of the areola, appearing to involve the skin of theareola, deviating the nipple towards the left.  No axillary findings.  Neg rightt breast.    02/13/2014 Clinical Stage    Stage IIB: T3 N0    03/07/2014 Definitive Surgery    Left mastectomy/SLNB: invasive ductal carcinoma grade 1; 4.8 cm with low-grade DCIS; margins negative 1/4 lymph nodes positive, ER 100% PR 100% HER-2 negative ratio 1.18 Ki-67 26% and 19%     03/07/2014 Pathologic Stage    Stage IIB (T2 N1)    04/03/2014 - 08/13/2014 Chemotherapy    Adjuvant chemotherapy with dose dense adriamycin and cytoxan followed by weekly abraxane 12    09/09/2014 - 10/17/2014 Radiation Therapy    Adjuvant RT [name]): Left chest wall 50.4 Gy over 28 fractions. Left supraclavicular fossa and axilla: 45 Gy over 25 fractions    10/27/2014 -  Anti-estrogen oral therapy    Anastrozole 1 mg daily plus Ibrance ([name] Clinical Trial stopped 06/11/16)    01/16/2015 Survivorship    Survivorship visit completed and copy of care plan provided to patient.     Melanoma of skin ([place])   12/27/2017 Initial Diagnosis    Melanoma of skin ([place])      FAMILY HISTORY:  We obtained a detailed, 4-generation family history.  Significant diagnoses are listed below: Family History  Problem Relation Age of Onset  . Alcohol abuse Father   . Liver disease Father   . Emphysema Mother   . Heart disease Mother   .  Heart failure Mother   . Melanoma Brother 30       more than 3, unk exact number  . Celiac disease Daughter   . Hashimoto's thyroiditis Daughter   . Asthma Other        grandson  . Cancer Cousin        maybe had cancer- type unk   Theresa Huff has 2 daughters ages 73 and 44 with no hx of cancer.  She has 4 grandchildren (ages 9-23) with no hx of cancer.  Theresa Huff has a brother who is 31 and has a history of several melanomas.  She reports his first was dx in his 27's and he has had several since, more than 3, but she doesn't know the exact  count. Theresa Huff also has 2 sisters ages 5 and 13 with no hx of cancer.   Theresa Huff father: died in his 17's due to liver disease. Paternal Aunts/Uncles: 1 paternal aunt with no hx of cancer.  Paternal cousins: 1 cousin might have had cancer? Paternal grandfather: unk Paternal grandmother:unk  Theresa Huff's mother: died in her late 46's due to heart disease and emphysema.  She had a breast lump in her breast (unk any other details) in her 40's/50's.  Maternal Aunts/Uncles: none, mother was only child Maternal cousins: N/A Maternal grandfather: unk Maternal grandmother:died in her 38's, unk if any cancer  Theresa Huff is unaware of previous family history of genetic testing for hereditary cancer risks. Patient's maternal ancestors are of N. European descent, and paternal ancestors are of N. European descent. There is no reported Ashkenazi Jewish ancestry. There is no known consanguinity.  GENETIC TEST RESULTS: Genetic testing was performed through [facility]'s Multi-Cancer Panel and reported out on 01/10/2018.   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   A variant of uncertain significance (VUS) in a gene called MLH1 was also noted. c.622C>T (p.Pro208Ser)  The Multi-Cancer Panel offered by [facility] includes sequencing and/or deletion duplication testing of the following 90 genes: AIP, ALK, APC, ATM, AXIN2, BAP1, BARD1, BLM, BMPR1A, BRCA1, BRCA2, BRIP1, BUB1B, CASR, CDC73, CDH1, CDK4, CDKN1B, CDKN1C, CDKN2A, CEBPA, CHEK2, CTNNA1, DICER1, DIS3L2, EGFR, ENG, EPCAM, FH, FLCN, GALNT12, GATA2, GPC3, GREM1, HOXB13, HRAS, KIT, MAX, MEN1, MET, MITF, MLH1, MLH3, MSH2, MSH3, MSH6, MUTYH, NBN, NF1, NF2, NTHL1, PALB2, PDGFRA, PHOX2B, PMS2, POLD1, POLE, POT1, PRKAR1A, PTCH1, PTEN, RAD50, RAD51C, RAD51D, RB1, RECQL4, RET, RNF43, RPS20, RUNX1, SDHA, SDHAF2, SDHB, SDHC, SDHD, SMAD4, SMARCA4, SMARCB1, SMARCE1, STK11, SUFU, TERC, TERT, TMEM127,  TP53, TSC1, TSC2, VHL, WRN, WT1  The test report will be scanned into EPIC and will be located under the Molecular Pathology section of the Results Review tab. A portion of the result report is included below for reference.     We discussed with Theresa Huff that because current genetic testing is not perfect, it is possible there may be a gene mutation in one of these genes that current testing cannot detect, but that chance is small.  We also discussed, that there could be another gene that has not yet been discovered, or that we have not yet tested, that is responsible for the cancer diagnoses in the family. It is also possible there is a hereditary cause for the cancer in the family that Theresa Huff did not inherit and therefore was not identified in her testing.  Therefore, it is important to remain in touch with cancer genetics in the future so that we can continue to  offer Theresa Huff the most up to date genetic testing.   Regarding the VUS in MLH1: At this time, it is unknown if this variant is associated with increased cancer risk or if this is a normal finding, but most variants such as this get reclassified to being inconsequential. It should not be used to make medical management decisions. With time, we suspect the lab will determine the significance of this variant, if any. If we do learn more about it, we will try to contact Theresa Huff to discuss it further. However, it is important to stay in touch with [place] periodically and keep the address and phone number up to date.  CDKN2A (p16INK4a): Pathogenic variants in the CDKN2A gene cause a condition called Familial atypical multiple mole melanoma syndrome (FAMMM syndrome).  Individuals with this syndrome typically present with multiple atypical moles, increased risk for melanoma, and increased risk for pancreatic cancer. The lifetime risk for melanoma is estimated to be between 58% to 92%.  The lifetime risk for pancreatic has been estimated to be  about 17% by age 60, but this risk appears to vary depending on the specific mutation, and has been reported in the literature to be anywhere from 11%-60%.   This condition is inherited in an autosomal dominant pattern.    Surveillance for these individuals inclues: routine skin exams and monthly self skin exams.    Pancreatic cancer screening (MRCP/EUS)  can be considered in individuals who have a CDKN2A mutation in addition to a family history of pancreatic cancer or other personal risk factors (smoking, diabetes, pancreatitis, etc).  Theresa Huff does not report any history of pancreatic cancer in her family she is aware of.   RECOMMENDATIONS FOR FAMILY MEMBERS:   Theresa Huff children and siblings each have a 50% chance to also have this CDKN2A mutation.  We recommend they have genetic testing to determine if they carry this mutation.    At this time we do not know if this mutation was maternally or paternally inherited.  Therefore we recommend additional relatives on both sides of the family also have genetic counseling and genetic testing.   It is important for all relative to inform their doctors about the family history of cancer and the CDKN2A mutation so they can make the best individual recommendations for them.   PLAN:  1. Theresa Huff was offered a follow-up appointment with genetics to discuss this result furhter. She declines at this time, she feels comfortable with this result and has no further questions for [place] at this time.  2. Theresa Huff will follow-up with her oncologist Dr. Lindi Adie regarding this result. These will also be sent to her dermatologist office.   3. Theresa Huff will tell her family members about this mutation and let us know if we can be of assistance in testing or helping them find genetic counselors near them.   Lastly, we discussed with Theresa Huff. Serpe that cancer genetics is a rapidly advancing field and it is possible that new genetic tests will be appropriate for her  and/or her family members in the future. We encouraged her to remain in contact with cancer genetics on an annual basis so we can update her personal and family histories and let her know of advances in cancer genetics that may benefit this family.   Our contact number was provided. Theresa Huff. Schwanke questions were answered to her satisfaction, and she knows she is welcome to call us at anytime with additional questions or concerns.  Ferol Luz, Theresa Huff, [facility] Certified Genetic Counselor [email or web address]

## 2018-01-30 ENCOUNTER — Encounter: Payer: Self-pay | Admitting: Genetics

## 2018-01-31 ENCOUNTER — Ambulatory Visit (INDEPENDENT_AMBULATORY_CARE_PROVIDER_SITE_OTHER): Payer: BLUE CROSS/BLUE SHIELD | Admitting: Podiatry

## 2018-01-31 ENCOUNTER — Encounter: Payer: Self-pay | Admitting: Podiatry

## 2018-01-31 ENCOUNTER — Ambulatory Visit (INDEPENDENT_AMBULATORY_CARE_PROVIDER_SITE_OTHER): Payer: BLUE CROSS/BLUE SHIELD

## 2018-01-31 VITALS — BP 116/73 | HR 78 | Temp 97.4°F | Resp 16

## 2018-01-31 DIAGNOSIS — M2041 Other hammer toe(s) (acquired), right foot: Secondary | ICD-10-CM | POA: Diagnosis not present

## 2018-01-31 DIAGNOSIS — Z9889 Other specified postprocedural states: Secondary | ICD-10-CM

## 2018-02-01 NOTE — Progress Notes (Signed)
She presents today for follow-up of her hammertoe repair third right foot.  Date of surgery November 04, 2017 states that is doing just fine denies fever chills nausea muscle aches pains calf pain back pain chest pain shortness of breath.  Objective: Third toe right foot appears to be healing very nicely radiographs confirm nice arthrodesis today.  Assessment: Well-healing surgical toe.  Plan: Follow-up with me on an as-needed basis.

## 2018-02-17 DIAGNOSIS — J309 Allergic rhinitis, unspecified: Secondary | ICD-10-CM | POA: Diagnosis not present

## 2018-02-17 DIAGNOSIS — F329 Major depressive disorder, single episode, unspecified: Secondary | ICD-10-CM | POA: Diagnosis not present

## 2018-02-17 DIAGNOSIS — K219 Gastro-esophageal reflux disease without esophagitis: Secondary | ICD-10-CM | POA: Diagnosis not present

## 2018-02-17 DIAGNOSIS — E559 Vitamin D deficiency, unspecified: Secondary | ICD-10-CM | POA: Diagnosis not present

## 2018-02-17 DIAGNOSIS — E7800 Pure hypercholesterolemia, unspecified: Secondary | ICD-10-CM | POA: Diagnosis not present

## 2018-02-17 DIAGNOSIS — G4700 Insomnia, unspecified: Secondary | ICD-10-CM | POA: Diagnosis not present

## 2018-02-18 ENCOUNTER — Encounter: Payer: Self-pay | Admitting: Hematology and Oncology

## 2018-02-18 DIAGNOSIS — Z1231 Encounter for screening mammogram for malignant neoplasm of breast: Secondary | ICD-10-CM | POA: Diagnosis not present

## 2018-02-18 DIAGNOSIS — Z853 Personal history of malignant neoplasm of breast: Secondary | ICD-10-CM | POA: Diagnosis not present

## 2018-02-28 ENCOUNTER — Other Ambulatory Visit: Payer: Self-pay | Admitting: Nurse Practitioner

## 2018-03-06 DIAGNOSIS — S0990XA Unspecified injury of head, initial encounter: Secondary | ICD-10-CM | POA: Diagnosis not present

## 2018-03-06 DIAGNOSIS — M542 Cervicalgia: Secondary | ICD-10-CM | POA: Diagnosis not present

## 2018-03-06 DIAGNOSIS — S52591A Other fractures of lower end of right radius, initial encounter for closed fracture: Secondary | ICD-10-CM | POA: Diagnosis not present

## 2018-03-06 DIAGNOSIS — R51 Headache: Secondary | ICD-10-CM | POA: Diagnosis not present

## 2018-03-06 DIAGNOSIS — S060X0A Concussion without loss of consciousness, initial encounter: Secondary | ICD-10-CM | POA: Diagnosis not present

## 2018-03-06 DIAGNOSIS — S161XXA Strain of muscle, fascia and tendon at neck level, initial encounter: Secondary | ICD-10-CM | POA: Diagnosis not present

## 2018-03-06 DIAGNOSIS — S299XXA Unspecified injury of thorax, initial encounter: Secondary | ICD-10-CM | POA: Diagnosis not present

## 2018-03-06 DIAGNOSIS — S8002XA Contusion of left knee, initial encounter: Secondary | ICD-10-CM | POA: Diagnosis not present

## 2018-03-06 DIAGNOSIS — S139XXA Sprain of joints and ligaments of unspecified parts of neck, initial encounter: Secondary | ICD-10-CM | POA: Diagnosis not present

## 2018-03-06 DIAGNOSIS — S80212A Abrasion, left knee, initial encounter: Secondary | ICD-10-CM | POA: Diagnosis not present

## 2018-03-06 DIAGNOSIS — S52501A Unspecified fracture of the lower end of right radius, initial encounter for closed fracture: Secondary | ICD-10-CM | POA: Diagnosis not present

## 2018-03-06 DIAGNOSIS — S060X9A Concussion with loss of consciousness of unspecified duration, initial encounter: Secondary | ICD-10-CM | POA: Diagnosis not present

## 2018-03-06 DIAGNOSIS — S199XXA Unspecified injury of neck, initial encounter: Secondary | ICD-10-CM | POA: Diagnosis not present

## 2018-03-16 ENCOUNTER — Encounter (INDEPENDENT_AMBULATORY_CARE_PROVIDER_SITE_OTHER): Payer: Self-pay | Admitting: Orthopaedic Surgery

## 2018-03-16 ENCOUNTER — Ambulatory Visit (INDEPENDENT_AMBULATORY_CARE_PROVIDER_SITE_OTHER): Payer: BLUE CROSS/BLUE SHIELD | Admitting: Orthopaedic Surgery

## 2018-03-16 ENCOUNTER — Ambulatory Visit (INDEPENDENT_AMBULATORY_CARE_PROVIDER_SITE_OTHER): Payer: Self-pay

## 2018-03-16 DIAGNOSIS — M25531 Pain in right wrist: Secondary | ICD-10-CM

## 2018-03-16 DIAGNOSIS — S52551A Other extraarticular fracture of lower end of right radius, initial encounter for closed fracture: Secondary | ICD-10-CM

## 2018-03-16 NOTE — Progress Notes (Signed)
Office Visit Note   Patient: Theresa Huff           Date of Birth: 1950/10/14           MRN: [id number] Visit Date: 03/16/2018              Requested by: Antony Contras, MD [address], [place] [id number] PCP: Antony Contras, MD   Assessment & Plan: Visit Diagnoses:  1. Pain in right wrist   2. Closed extraarticular fracture of distal radius, right, initial encounter     Plan: I went over her x-rays with her in detail.  We then try to treat her in a short arm cast.  I like to see her back in 2 weeks to have this cast removed and repeat AP and lateral of her right wrist but also like a clenched fist view for a separate AP view of the wrist with her fist clenched to better assess the scapholunate interval.  I talked to her about referring her to a hand surgeon for this but she says this is her nondominant side and she is 41 and that she would prefer the course to be nonoperative treatment.  We will see how things look in 2 weeks from now with new x-rays.  All question concerns were answered and addressed.  Follow-Up Instructions: Return for 2 weeks post-op.   Orders:  Orders Placed This Encounter  Procedures  . XR Wrist Complete Right   No orders of the defined types were placed in this encounter.     Procedures: No procedures performed   Clinical Data: No additional findings.   Subjective: Chief Complaint  Patient presents with  . Right Wrist - Injury  The patient is a very pleasant 68 year old left-hand-dominant female who 10 days ago was on vacation in [place] when she fell on outstretched right wrist.  She was found to have a distal radius fracture which is extra-articular.  She was placed appropriate in a sugar tong splint and is now following up back here at home.  She is an Glass blower/designer and works daily.  She denies any numbness and tingling in her right hand on the injured side.  She has not injured this wrist before.  HPI  Review of  Systems She currently denies any headache, chest pain, shortness of breath, fever, chills, nausea, vomiting  Objective: Vital Signs: There were no vitals taken for this visit.  Physical Exam She is alert and orient x3 and in no acute distress Ortho Exam Examination of her right wrist is performed out of her splint.  Clinically is well located and in normal alignment grossly.  Her hand is slightly swollen but well perfused.  She has palpable pulses in the wrist.  Her right elbow and shoulder exam are normal. Specialty Comments:  No specialty comments available.  Imaging: Xr Wrist Complete Right  Result Date: 03/16/2018 3 views of the right wrist today show a nondisplaced distal radius fracture with near anatomic alignment.  This appears to be extra-articular.  There is widening of the scapholunate interval.    PMFS History: Patient Active Problem List   Diagnosis Date Noted  . Monoallelic mutation of [id number] gene 01/27/2018  . Genetic testing 01/27/2018  . Melanoma of skin ([place]) 12/27/2017  . Family history of melanoma   . Personal history of melanoma in-situ   . Essential tremor 07/19/2017  . Migraine 05/06/2014  . Breast cancer of upper-outer quadrant of left female breast (  [place]) 02/06/2014  . OSA (obstructive sleep apnea) 09/09/2010  . Restless leg syndrome 09/09/2010  . Depressive disorder, not elsewhere classified   . Asthma, cough variant   . Insomnia, unspecified   . Allergic rhinitis due to pollen    Past Medical History:  Diagnosis Date  . Allergic rhinitis due to pollen   . Allergy   . Anemia    with pregnancy  . Breast cancer ([place])   . Cough   . Depressive disorder, not elsewhere classified   . Family history of melanoma   . GERD (gastroesophageal reflux disease)   . Hot flashes   . Insomnia, unspecified   . Melanoma of skin ([place]) 12/27/2017  . Personal history of melanoma in-situ   . Plantar fasciitis   . Radiation 09/09/14-10/17/14   Left  chestwall/supraclav.  Marland Kitchen Restless leg syndrome 09/09/2010  . Skin cancer   . Wears glasses   . Weight gain     Family History  Problem Relation Age of Onset  . Alcohol abuse Father   . Liver disease Father   . Emphysema Mother   . Heart disease Mother   . Heart failure Mother   . Melanoma Brother 30       more than 3, unk exact number  . Celiac disease Daughter   . Hashimoto's thyroiditis Daughter   . Asthma Other        grandson  . Cancer Cousin        maybe had cancer- type unk    Past Surgical History:  Procedure Laterality Date  . COLONOSCOPY    . MASTECTOMY W/ SENTINEL NODE BIOPSY Left 03/07/2014   Procedure: LEFT TOTAL MASTECTOMY WITH LEFT SENTINEL LYMPH NODE BIOPSY;  Surgeon: Rolm Bookbinder, MD;  Location: [facility];  Service: General;  Laterality: Left;  Marland Kitchen MELANOMA EXCISION     right leg, left arm, right arm (several from each area)  . PORT-A-CATH REMOVAL N/A 12/04/2014   Procedure: REMOVAL PORT-A-CATH;  Surgeon: Rolm Bookbinder, MD;  Location: [facility];  Service: General;  Laterality: N/A;  . PORTACATH PLACEMENT Right 04/01/2014   Procedure: INSERTION PORT-A-CATH;  Surgeon: Rolm Bookbinder, MD;  Location: [facility];  Service: General;  Laterality: Right;  . TONSILLECTOMY     Social History   Occupational History  . Occupation: Aeronautical engineer: [employer]  Tobacco Use  . Smoking status: Former Smoker    Packs/day: 0.20    Years: 6.00    Pack years: 1.20    Types: Cigarettes    Last attempt to quit: 01/25/1974    Years since quitting: 44.1  . Smokeless tobacco: Never Used  Substance and Sexual Activity  . Alcohol use: Yes    Alcohol/week: 2.0 standard drinks    Types: 2 Glasses of wine per week    Comment: 2 glasses wine per week  . Drug use: No  . Sexual activity: Never

## 2018-03-22 ENCOUNTER — Telehealth (INDEPENDENT_AMBULATORY_CARE_PROVIDER_SITE_OTHER): Payer: Self-pay | Admitting: Orthopaedic Surgery

## 2018-03-22 NOTE — Telephone Encounter (Signed)
Please advise thanks.

## 2018-03-22 NOTE — Telephone Encounter (Signed)
Patient called advised her right arm is hurting at the wrist and her fingers are aching as well. Patient said  This started over the weekend. Patient asked if this is normal or not. The number to contact patient is [phone]

## 2018-03-22 NOTE — Telephone Encounter (Signed)
Give her reassurance that this is absolutely normal following a wrist fracture.

## 2018-03-23 NOTE — Telephone Encounter (Signed)
IC s/w patient and advised. She verbalized understanding.  

## 2018-03-29 ENCOUNTER — Ambulatory Visit
Admission: RE | Admit: 2018-03-29 | Discharge: 2018-03-29 | Disposition: A | Discharge status: Home / Self Care | Payer: BLUE CROSS/BLUE SHIELD | Source: Ambulatory Visit | Attending: Family Medicine | Admitting: Family Medicine

## 2018-03-29 ENCOUNTER — Other Ambulatory Visit: Payer: Self-pay | Admitting: Family Medicine

## 2018-03-29 DIAGNOSIS — R600 Localized edema: Secondary | ICD-10-CM | POA: Diagnosis not present

## 2018-03-29 DIAGNOSIS — M7989 Other specified soft tissue disorders: Secondary | ICD-10-CM | POA: Diagnosis not present

## 2018-03-29 DIAGNOSIS — R52 Pain, unspecified: Secondary | ICD-10-CM

## 2018-03-30 ENCOUNTER — Other Ambulatory Visit (INDEPENDENT_AMBULATORY_CARE_PROVIDER_SITE_OTHER): Payer: Self-pay

## 2018-03-30 ENCOUNTER — Ambulatory Visit (INDEPENDENT_AMBULATORY_CARE_PROVIDER_SITE_OTHER): Payer: BLUE CROSS/BLUE SHIELD

## 2018-03-30 ENCOUNTER — Ambulatory Visit (INDEPENDENT_AMBULATORY_CARE_PROVIDER_SITE_OTHER): Payer: BLUE CROSS/BLUE SHIELD | Admitting: Orthopaedic Surgery

## 2018-03-30 ENCOUNTER — Encounter (INDEPENDENT_AMBULATORY_CARE_PROVIDER_SITE_OTHER): Payer: Self-pay | Admitting: Orthopaedic Surgery

## 2018-03-30 DIAGNOSIS — S52551D Other extraarticular fracture of lower end of right radius, subsequent encounter for closed fracture with routine healing: Secondary | ICD-10-CM | POA: Diagnosis not present

## 2018-03-30 NOTE — Progress Notes (Signed)
The patient is now just barely under 4 weeks status post a distal radius fracture of her right nondominant wrist.  She is left-handed.  This happened when she was on a trip.  It is an extra-articular fracture and we put her in a short arm cast.  The only concerning feature of her injury was slight widening of the SL interval between the scaphoid and lunate.  She has been doing well and is looking forward to having her cast removed.  On examination with the cast remove her swelling is down significantly.  She moves her fingers and thumb easily.  She does gently flex and extend the wrist but obviously not fully.  She gently pronates and supinates the wrist as well.  She denies any numbness in her fingertips.  Her hand is well-perfused.  3 views of her right wrist are reviewed.  1 AP view I had them performed with a clenched fist view.  The fracture itself remains nondisplaced and extra-articular and shows interval signs of healing.  The clenched fist view shows some slight widening of the scapholunate interval of about 3 mm.  Amount of placement a Velcro wrist splint today.  I would like to send her to the [facility] for 1 of their hand surgeons to evaluate her in terms of whether or not shC needs an intervention as it relates to the scapholunate widening.  All question concerns were answered and addressed.  I am happy to see her back in 4 weeks with a feel that things are okay.  At that visit I would like just an AP and lateral of the right wrist.  All question concerns were answered and addressed.  She says that she is fine with [place] making that referral for her.

## 2018-04-04 ENCOUNTER — Telehealth (INDEPENDENT_AMBULATORY_CARE_PROVIDER_SITE_OTHER): Payer: Self-pay | Admitting: Orthopaedic Surgery

## 2018-04-05 ENCOUNTER — Encounter (INDEPENDENT_AMBULATORY_CARE_PROVIDER_SITE_OTHER): Payer: Self-pay | Admitting: Orthopaedic Surgery

## 2018-04-05 NOTE — Telephone Encounter (Signed)
This is a referral 

## 2018-04-10 DIAGNOSIS — S52551A Other extraarticular fracture of lower end of right radius, initial encounter for closed fracture: Secondary | ICD-10-CM | POA: Diagnosis not present

## 2018-04-21 ENCOUNTER — Encounter: Payer: Self-pay | Admitting: Hematology and Oncology

## 2018-04-24 ENCOUNTER — Encounter (INDEPENDENT_AMBULATORY_CARE_PROVIDER_SITE_OTHER): Payer: Self-pay | Admitting: Orthopaedic Surgery

## 2018-04-27 ENCOUNTER — Other Ambulatory Visit: Payer: Self-pay

## 2018-04-27 ENCOUNTER — Ambulatory Visit (INDEPENDENT_AMBULATORY_CARE_PROVIDER_SITE_OTHER): Payer: BLUE CROSS/BLUE SHIELD | Admitting: Orthopaedic Surgery

## 2018-04-27 ENCOUNTER — Ambulatory Visit (INDEPENDENT_AMBULATORY_CARE_PROVIDER_SITE_OTHER): Payer: Self-pay | Admitting: Orthopaedic Surgery

## 2018-04-27 ENCOUNTER — Encounter (INDEPENDENT_AMBULATORY_CARE_PROVIDER_SITE_OTHER): Payer: Self-pay | Admitting: Orthopaedic Surgery

## 2018-04-27 ENCOUNTER — Ambulatory Visit (INDEPENDENT_AMBULATORY_CARE_PROVIDER_SITE_OTHER): Payer: BLUE CROSS/BLUE SHIELD

## 2018-04-27 DIAGNOSIS — S52551D Other extraarticular fracture of lower end of right radius, subsequent encounter for closed fracture with routine healing: Secondary | ICD-10-CM
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
## 2018-05-22 ENCOUNTER — Encounter: Payer: Self-pay | Admitting: Hematology and Oncology

## 2018-05-26 NOTE — Assessment & Plan Note (Signed)
Left breast invasive ductal carcinoma grade 1; 4.8 cm with low-grade DCIS; margins negative 1/4 lymph nodes positive, ER 100% PR 100% HER-2 negative ratio 1.18 Ki-67 26% and 19% T2 N1 stage IIB status post adjuvant chemotherapy with dose dense Adriamycin and Cytoxan 4 followed by Abraxane weekly 12 Completed Adjuvant radiation therapy 10/17/14 ------------------------------------------------------------------------------- Current Treatment: [name] study: Randomized to Ibrance + Anastrozole Started 10/27/2014;Discontinued Palbociclib for toxicities 06/11/2016(primarily neutropenia); Switched to Letrozole 09/13/2016 (shoulder pain)  Letrozole toxicities: Very occasional hot flashes. Does not have the myalgias related to prior anastrozole  Disc laminectomy 06/09/2016: Postoperatively patient is doing quite well. Pain in the back has markedly subsided and she is able to walk without any major limitations.  Surveillance: 1. Breast Exam: 05/25/17: Benign 2. Mammograms Jan 2020: [facility]: No evidence of malignancy  Return to clinic in1 yearfor follow-up

## 2018-05-29 DIAGNOSIS — T148XXA Other injury of unspecified body region, initial encounter: Secondary | ICD-10-CM | POA: Diagnosis not present

## 2018-05-30 ENCOUNTER — Telehealth: Payer: Self-pay | Admitting: Hematology and Oncology

## 2018-05-30 DIAGNOSIS — T148XXA Other injury of unspecified body region, initial encounter: Secondary | ICD-10-CM | POA: Diagnosis not present
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
## 2018-05-30 NOTE — Telephone Encounter (Signed)
Patient agreed to Webex mtg for 05/31/18. Sent email with step-by-step instructions how to download and access mtg.  Will call if any questions.

## 2018-05-31 ENCOUNTER — Inpatient Hospital Stay: Payer: BLUE CROSS/BLUE SHIELD | Attending: Hematology and Oncology | Admitting: Hematology and Oncology

## 2018-05-31 ENCOUNTER — Telehealth: Payer: Self-pay | Admitting: Medical Oncology

## 2018-05-31 DIAGNOSIS — M7989 Other specified soft tissue disorders: Secondary | ICD-10-CM | POA: Diagnosis not present

## 2018-05-31 DIAGNOSIS — Z170 Estrogen receptor positive status [ER+]: Secondary | ICD-10-CM | POA: Diagnosis not present

## 2018-05-31 DIAGNOSIS — C50412 Malignant neoplasm of upper-outer quadrant of left female breast: Secondary | ICD-10-CM | POA: Diagnosis not present

## 2018-05-31 DIAGNOSIS — Z006 Encounter for examination for normal comparison and control in clinical research program: Secondary | ICD-10-CM | POA: Diagnosis not present

## 2018-05-31 DIAGNOSIS — Z79811 Long term (current) use of aromatase inhibitors: Secondary | ICD-10-CM

## 2018-05-31 DIAGNOSIS — Z923 Personal history of irradiation: Secondary | ICD-10-CM

## 2018-05-31 DIAGNOSIS — Z780 Asymptomatic menopausal state: Secondary | ICD-10-CM

## 2018-05-31 DIAGNOSIS — Z9221 Personal history of antineoplastic chemotherapy: Secondary | ICD-10-CM

## 2018-05-31 MED ORDER — LETROZOLE 2.5 MG PO TABS: 2.5000 mg | ORAL_TABLET | Freq: Every day | ORAL | 3 refills | Status: DC

## 2018-05-31 NOTE — Telephone Encounter (Signed)
[name]: 24- month follow-up call Call to patient regarding 24 month follow-up on study. Patient was scheduled for clinic visit with MD, but due to [id number] hospital policy, patient had a WebEx appointment with MD this morning instead. MD addressed patients issues and concerns (please see MD encounter note) as well as any ongoing adverse events. I spoke with patient regarding her status. Patient reports to be doing well and confirms to continue with daily letrozole.  I informed patient that there is a 24 month questionnaire for study to complete and if it was alright for me to mail these two questionnaires to her, patient confirmed that it was. Patient was asked to return completed questionnaires in the postage-paid envelopes provided. Patient gave her verbal understanding. Patient denies having any questions at this time. Informed patient that next study contact will be in approximately six months. Patient was thanked for her time and continued support of study and was encouraged to call Dr. Lindi Adie or myself with any questions or concerns she may have. Patient knows to expect questionnaires in the mail in the next few days.  Patient had her yearly mammogram February 18, 2018. Maxwell Marion, RN, BSN, [facility] Clinical Research 05/31/2018 2:06 PM

## 2018-06-01 ENCOUNTER — Other Ambulatory Visit: Payer: Self-pay

## 2018-06-01 ENCOUNTER — Telehealth: Payer: Self-pay | Admitting: Hematology and Oncology

## 2018-06-01 ENCOUNTER — Ambulatory Visit (HOSPITAL_COMMUNITY)
Admission: RE | Admit: 2018-06-01 | Discharge: 2018-06-01 | Disposition: A | Discharge status: Home / Self Care | Payer: BLUE CROSS/BLUE SHIELD | Source: Ambulatory Visit | Attending: Hematology and Oncology | Admitting: Hematology and Oncology

## 2018-06-01 DIAGNOSIS — M7989 Other specified soft tissue disorders: Secondary | ICD-10-CM | POA: Diagnosis not present

## 2018-06-01 NOTE — Progress Notes (Signed)
Left lower extremity venous duplex has been completed. Preliminary results can be found in CV Proc through chart review.  Results were given to Dr. Lindi Adie.   06/01/18 1:26 PM Carlos Levering RVT

## 2018-06-01 NOTE — Telephone Encounter (Signed)
Tried to reach regarding schedule °

## 2018-06-13 ENCOUNTER — Telehealth: Payer: Self-pay | Admitting: *Deleted

## 2018-06-13 ENCOUNTER — Ambulatory Visit (INDEPENDENT_AMBULATORY_CARE_PROVIDER_SITE_OTHER): Payer: BLUE CROSS/BLUE SHIELD | Admitting: Podiatry

## 2018-06-13 ENCOUNTER — Other Ambulatory Visit: Payer: Self-pay | Admitting: Podiatry

## 2018-06-13 ENCOUNTER — Other Ambulatory Visit: Payer: Self-pay

## 2018-06-13 ENCOUNTER — Ambulatory Visit (INDEPENDENT_AMBULATORY_CARE_PROVIDER_SITE_OTHER): Payer: BLUE CROSS/BLUE SHIELD

## 2018-06-13 ENCOUNTER — Encounter: Payer: Self-pay | Admitting: Podiatry

## 2018-06-13 VITALS — Temp 97.2°F

## 2018-06-13 DIAGNOSIS — M722 Plantar fascial fibromatosis: Secondary | ICD-10-CM | POA: Diagnosis not present

## 2018-06-13 DIAGNOSIS — M778 Other enthesopathies, not elsewhere classified: Secondary | ICD-10-CM

## 2018-06-13 MED ORDER — METHYLPREDNISOLONE 4 MG PO TBPK: ORAL_TABLET | ORAL | 0 refills | Status: DC

## 2018-06-13 NOTE — Telephone Encounter (Signed)
Pt states she remembered Dr. Milinda Pointer wanted her to have a steroid prescription, but she did not pick one up in office.

## 2018-06-13 NOTE — Telephone Encounter (Signed)
I called pt and informed the Medrol dose pack had been called to [facility] on [address] today at 8:08am.

## 2018-06-14 ENCOUNTER — Encounter: Payer: Self-pay | Admitting: Podiatry

## 2018-06-14 NOTE — Progress Notes (Signed)
She presents today for follow-up stating that she has some arch pain and midfoot pain she states that her fasciitis is starting to act up again she tried stretching and plantar fascial brace but really does not seem to be doing the job.  Objective: Vital signs are stable she is alert and oriented x3.  Pulses are palpable.  There is pain on palpation medial calcaneal tubercle of the left heel and into the midfoot.  Assessment: Plantar fasciitis.  Plan: Went ahead and reinjected her heel again today with 20 mg Kenalog 5 mg Marcaine point maximal tenderness.  Start her back on her methylprednisolone and I will follow back up with her in 1 month

## 2018-07-10 ENCOUNTER — Telehealth: Payer: Self-pay | Admitting: Medical Oncology

## 2018-07-10 NOTE — Telephone Encounter (Signed)
[name] LVMOM with patient inquiring of study questionnaires that were mailed to her in May. Asked patient if she had a chance to complete and return mail in the postage paid envelopes provided. Also informed patient there has been new information released regarding palbociclib study drug and study results that the study wishes [place] to inform participants of. Asked patient to return call at her earliest convenience. Patient thanked. Return call information provided.  Maxwell Marion, RN, BSN, [facility] Clinical Research 07/10/2018 3:31 PM

## 2018-07-11 ENCOUNTER — Encounter: Payer: Self-pay | Admitting: Podiatry

## 2018-07-11 ENCOUNTER — Ambulatory Visit (INDEPENDENT_AMBULATORY_CARE_PROVIDER_SITE_OTHER): Payer: BC Managed Care – PPO | Admitting: Podiatry

## 2018-07-11 ENCOUNTER — Other Ambulatory Visit: Payer: Self-pay

## 2018-07-11 VITALS — Temp 97.3°F

## 2018-07-11 DIAGNOSIS — M722 Plantar fascial fibromatosis: Secondary | ICD-10-CM

## 2018-07-11 NOTE — Progress Notes (Signed)
She presents today stating that her heel is about 50% improved but she has swelling to her left leg.  Objective: Vital signs are stable alert oriented x3.  Pulses are palpable.  Pitting edema to the anterior leg left no swelling in the foot itself.  No pain or at least minimal pain on palpation medial calcaneal tubercle of the left heel.  Assessment: Probable venous insufficiency of the left leg.  With resolving plantar fasciitis.  Plan: Discussed etiology pathology and surgical therapies at this point time went ahead and injected her left heel today with 20 mg Kenalog 5 mg Marcaine point of maximal tenderness.  Tolerated procedure well without complications.  If she is not better the next time I see her as far as the swelling goes we will schedule her for venous evaluation I also provided her with ankle [place] today to help with the edema.

## 2018-07-13 ENCOUNTER — Telehealth: Payer: Self-pay | Admitting: Medical Oncology

## 2018-07-13 NOTE — Telephone Encounter (Signed)
[name]: Patient Letter Call to patient to inform her that research study experts have conducted a review of the study and have determined that there was very little chance that the addition of Palbociclib to endocrine therapy significantly reduced cancer recurrence, compared to endocrine therapy alone. Patient also informed that there was no sign of unexpected toxicity or harm to people taking Palbociclib with endocrine therapy. Patient had already completed Palbociclib and is currently in the follow-up phase of study. Patient was informed that a letter will be sent out via mail, this Friday the 19th, as well, informing her of the same. I inquired with patient if she had any questions regarding this new information and she denied any questions at this time. Patient was thanked for her time and her continued support of study and was encouraged to call Dr. Lindi Adie or myself with any questions or concerns.  Patient also informed me that he has placed her completed study questionnaires in the mail, for return to me, and I should receive them next week. Patient thanked for informing me. Maxwell Marion, RN, BSN, [facility] Clinical Research 07/13/2018 1:33 PM

## 2018-07-14 DIAGNOSIS — C50912 Malignant neoplasm of unspecified site of left female breast: Secondary | ICD-10-CM | POA: Diagnosis not present

## 2018-07-18 ENCOUNTER — Encounter: Payer: Self-pay | Admitting: Hematology and Oncology

## 2018-07-21 ENCOUNTER — Telehealth: Payer: Self-pay

## 2018-07-21 MED ORDER — SUMATRIPTAN SUCCINATE 50 MG PO TABS: ORAL_TABLET | ORAL | 0 refills | Status: DC

## 2018-07-21 NOTE — Telephone Encounter (Signed)
Pt requesting refill on Imitrex, history of migraines.  Ok per Dr. Lindi Adie, refill sent.

## 2018-07-31 ENCOUNTER — Encounter: Payer: Self-pay | Admitting: Medical Oncology

## 2018-07-31 NOTE — Progress Notes (Signed)
[name] Received patient's completed study questionnaires in the mail, for month 24 follow-up visit. Maxwell Marion, RN, BSN, [facility] Clinical Research 07/31/2018 9:35 AM

## 2018-08-13 ENCOUNTER — Other Ambulatory Visit: Payer: Self-pay | Admitting: Hematology and Oncology

## 2018-08-15 ENCOUNTER — Ambulatory Visit: Payer: BC Managed Care – PPO | Admitting: Podiatry

## 2018-08-15 ENCOUNTER — Other Ambulatory Visit: Payer: Self-pay

## 2018-08-15 ENCOUNTER — Encounter: Payer: Self-pay | Admitting: Podiatry

## 2018-08-15 VITALS — Temp 98.4°F

## 2018-08-15 DIAGNOSIS — I872 Venous insufficiency (chronic) (peripheral): Secondary | ICD-10-CM

## 2018-08-15 DIAGNOSIS — M722 Plantar fascial fibromatosis: Secondary | ICD-10-CM | POA: Diagnosis not present

## 2018-08-16 NOTE — Progress Notes (Signed)
She presents today for follow-up of her left heel states that is doing better the compression stocking seems to be helping a lot.  She still has a lot of edema in her left leg they had ruled out a DVT back in May but did not do venous studies looking for venous insufficiency.  She has had lymph node excision in her left breast and axilla resulting in chronic lymphedema to the left arm wears a compression sleeve there.  States that the fasciitis feels better with a compression sleeve on the left foot.  Objective: No reproducible pain on palpation medial calcaneal tubercle of the left foot much decrease in edema where the compression anklet was on however once removed the edema is noticeable to the superior portion of the leg.  Assessment: Chronic lymphedema and venous insufficiency possibly resolving plantar fasciitis.  Plan: We will request that we send her to vascular for venous study.  I will follow-up with her once that is command we did discuss the possible need for a full-length sleeve for her leg.

## 2018-08-17 ENCOUNTER — Other Ambulatory Visit: Payer: Self-pay | Admitting: Podiatry

## 2018-08-17 ENCOUNTER — Other Ambulatory Visit: Payer: Self-pay

## 2018-08-17 ENCOUNTER — Ambulatory Visit (HOSPITAL_COMMUNITY)
Admission: RE | Admit: 2018-08-17 | Discharge: 2018-08-17 | Disposition: A | Discharge status: Home / Self Care | Payer: BC Managed Care – PPO | Source: Ambulatory Visit | Attending: Cardiology | Admitting: Cardiology

## 2018-08-17 DIAGNOSIS — I872 Venous insufficiency (chronic) (peripheral): Secondary | ICD-10-CM | POA: Insufficient documentation

## 2018-08-22 ENCOUNTER — Telehealth: Payer: Self-pay | Admitting: *Deleted

## 2018-08-22 DIAGNOSIS — I872 Venous insufficiency (chronic) (peripheral): Secondary | ICD-10-CM

## 2018-08-22 NOTE — Telephone Encounter (Signed)
I informed pt of Dr. Stephenie Acres review of doppler results and asked if she was established with a vvascular group and she stated no. I asked if she would like to be referred to the same group as her testing and she agreed. Faxed referral to [facility].

## 2018-08-22 NOTE — Telephone Encounter (Signed)
-----   Message from Garrel Ridgel, [place] sent at 08/17/2018  4:50 PM EDT ----- Follow up with vascular doctor for tx options.

## 2018-09-06 DIAGNOSIS — Z85820 Personal history of malignant melanoma of skin: Secondary | ICD-10-CM | POA: Diagnosis not present

## 2018-09-06 DIAGNOSIS — D229 Melanocytic nevi, unspecified: Secondary | ICD-10-CM | POA: Diagnosis not present

## 2018-09-06 DIAGNOSIS — L820 Inflamed seborrheic keratosis: Secondary | ICD-10-CM | POA: Diagnosis not present

## 2018-09-06 DIAGNOSIS — L570 Actinic keratosis: Secondary | ICD-10-CM | POA: Diagnosis not present

## 2018-09-19 DIAGNOSIS — R05 Cough: Secondary | ICD-10-CM | POA: Diagnosis not present

## 2018-09-19 DIAGNOSIS — Z20828 Contact with and (suspected) exposure to other viral communicable diseases: Secondary | ICD-10-CM | POA: Diagnosis not present

## 2018-09-22 ENCOUNTER — Ambulatory Visit: Payer: BC Managed Care – PPO | Admitting: Cardiovascular Disease

## 2018-10-04 ENCOUNTER — Other Ambulatory Visit: Payer: Self-pay

## 2018-10-04 DIAGNOSIS — Z20822 Contact with and (suspected) exposure to covid-19: Secondary | ICD-10-CM

## 2018-10-05 LAB — NOVEL CORONAVIRUS, NAA: SARS-CoV-2, NAA: DETECTED — AB

## 2018-10-06 ENCOUNTER — Telehealth: Payer: Self-pay | Admitting: Critical Care Medicine

## 2018-10-06 ENCOUNTER — Other Ambulatory Visit: Payer: Self-pay | Admitting: Hematology and Oncology

## 2018-10-06 DIAGNOSIS — G43909 Migraine, unspecified, not intractable, without status migrainosus: Secondary | ICD-10-CM | POA: Diagnosis not present

## 2018-10-06 DIAGNOSIS — U071 COVID-19: Secondary | ICD-10-CM | POA: Diagnosis not present

## 2018-10-06 NOTE — Telephone Encounter (Signed)
I connected with this patient today she has a positive COVID test on 10/04/2018 at [facility] testing event.  Her history goes back to August 23 when she had classic symptoms of COVID 19 infection.  She tested positive on August 25 and has been and self-isolation in quarantine since that time.  All of her symptoms had resolved it appeared and she was trying to get additional medical testing done but was requested to have a negative test before any of those could be accomplished in the healthcare system.  She went to the [facility] event on Wednesday to be tested and she still positive the patient has had some migraines and minimal cough but no fever mild nasal congestion.  She has no at none of the other symptoms she had previously which were classic for COVID.  My impression is that she is shedding dead virus particles and she was just retested too soon.  I recommend she wait until 21 September to be retested unfortunately she will have to stand self-isolation for 10 days provided the last 3 days her fever free.  All the recommendations the nurse gave her yesterday she is to follow at this time.  I will send this note to her primary care physician and she is to follow-up with her primary care physician

## 2018-10-06 NOTE — Telephone Encounter (Signed)
Dr. Lindi Adie, please advise refill

## 2018-10-09 MED ORDER — SUMATRIPTAN SUCCINATE 50 MG PO TABS: ORAL_TABLET | ORAL | 0 refills | Status: DC

## 2018-10-19 ENCOUNTER — Other Ambulatory Visit: Payer: Self-pay

## 2018-10-19 ENCOUNTER — Ambulatory Visit: Payer: BC Managed Care – PPO | Admitting: Cardiology

## 2018-10-19 NOTE — Progress Notes (Deleted)
Cardiology Office Note:    Date:  10/19/2018   ID:  Theresa, Huff 1951-01-18, MRN [id number]  PCP:  Antony Contras, MD  Cardiologist:  No primary care provider on file.  Electrophysiologist:  None   Referring MD: Garrel Ridgel, DPM   No chief complaint on file. ***  History of Present Illness:    Theresa Huff is a 68 y.o. female with a hx of ***  Past Medical History:  Diagnosis Date   Allergic rhinitis due to pollen    Allergy    Anemia    with pregnancy   Breast cancer ([place])    Cough    Depressive disorder, not elsewhere classified    Family history of melanoma    GERD (gastroesophageal reflux disease)    Hot flashes    Insomnia, unspecified    Melanoma of skin ([place]) 12/27/2017   Personal history of melanoma in-situ    Plantar fasciitis    Radiation 09/09/14-10/17/14   Left chestwall/supraclav.   Restless leg syndrome 09/09/2010   Skin cancer    Wears glasses    Weight gain     Past Surgical History:  Procedure Laterality Date   COLONOSCOPY     MASTECTOMY W/ SENTINEL NODE BIOPSY Left 03/07/2014   Procedure: LEFT TOTAL MASTECTOMY WITH LEFT SENTINEL LYMPH NODE BIOPSY;  Surgeon: Rolm Bookbinder, MD;  Location: [facility];  Service: General;  Laterality: Left;   MELANOMA EXCISION     right leg, left arm, right arm (several from each area)   PORT-A-CATH REMOVAL N/A 12/04/2014   Procedure: REMOVAL PORT-A-CATH;  Surgeon: Rolm Bookbinder, MD;  Location: [facility];  Service: General;  Laterality: N/A;   PORTACATH PLACEMENT Right 04/01/2014   Procedure: INSERTION PORT-A-CATH;  Surgeon: Rolm Bookbinder, MD;  Location: [facility];  Service: General;  Laterality: Right;   TONSILLECTOMY      Current Medications: No outpatient medications have been marked as taking for the 10/19/18 encounter (Appointment) with Donato Heinz, MD.     Allergies:   Hydrocodone; Tramadol; Codeine; Iodine; Radiaplexrx [skin  protectants, misc.]; Sulfa antibiotics; Tape; and Primidone   Social History   Socioeconomic History   Marital status: Married    Spouse name: Legrand Como   Number of children: 2   Years of education: Not on file   Highest education level: Some college, no degree  Occupational History   Occupation: Aeronautical engineer: [employer]  Social Needs   Emergency planning/management officer strain: Not on file   Food insecurity    Worry: Not on file    Inability: Not on file   Transportation needs    Medical: Not on file    Non-medical: Not on file  Tobacco Use   Smoking status: Former Smoker    Packs/day: 0.20    Years: 6.00    Pack years: 1.20    Types: Cigarettes    Quit date: 01/25/1974    Years since quitting: 44.7   Smokeless tobacco: Never Used  Substance and Sexual Activity   Alcohol use: Yes    Alcohol/week: 2.0 standard drinks    Types: 2 Glasses of wine per week    Comment: 2 glasses wine per week   Drug use: No   Sexual activity: Never  Lifestyle   Physical activity    Days per week: Not on file    Minutes per session: Not on file   Stress: Not on file  Relationships   Social Herbalist on phone: Not on file    Gets together: Not on file    Attends religious service: Not on file    Active member of club or organization: Not on file    Attends meetings of clubs or organizations: Not on file    Relationship status: Not on file  Other Topics Concern   Not on file  Social History Narrative   Patient is left-handed. She lives with her husband in a 2 story house. She drinks 1 glass of tea a day. She does not exercise.     Family History: The patient's ***family history includes Alcohol abuse in her father; Asthma in an other family member; Cancer in her cousin; Celiac disease in her daughter; Emphysema in her mother; Hashimoto's thyroiditis in her daughter; Heart disease in her mother; Heart failure in her mother; Liver  disease in her father; Melanoma (age of onset: 8) in her brother.  ROS:   Please see the history of present illness.    *** All other systems reviewed and are negative.  EKGs/Labs/Other Studies Reviewed:    The following studies were reviewed today: ***  EKG:  EKG is *** ordered today.  The ekg ordered today demonstrates ***  Recent Labs: No results found for requested labs within last 8760 hours.  Recent Lipid Panel No results found for: CHOL, TRIG, HDL, CHOLHDL, VLDL, LDLCALC, LDLDIRECT  Physical Exam:    VS:  There were no vitals taken for this visit.    Wt Readings from Last 3 Encounters:  07/19/17 162 lb (73.5 kg)  05/25/17 161 lb 14.4 oz (73.4 kg)  09/13/16 155 lb 4.8 oz (70.4 kg)     GEN: *** Well nourished, well developed in no acute distress HEENT: Normal NECK: No JVD; No carotid bruits LYMPHATICS: No lymphadenopathy CARDIAC: ***RRR, no murmurs, rubs, gallops RESPIRATORY:  Clear to auscultation without rales, wheezing or rhonchi  ABDOMEN: Soft, non-tender, non-distended MUSCULOSKELETAL:  No edema; No deformity  SKIN: Warm and dry NEUROLOGIC:  Alert and oriented x 3 PSYCHIATRIC:  Normal affect   ASSESSMENT:    No diagnosis found. PLAN:    In order of problems listed above:  1. ***   Medication Adjustments/Labs and Tests Ordered: Current medicines are reviewed at length with the patient today.  Concerns regarding medicines are outlined above.  No orders of the defined types were placed in this encounter.  No orders of the defined types were placed in this encounter.   There are no Patient Instructions on file for this visit.   Signed, Donato Heinz, MD  10/19/2018 2:56 PM    [place]

## 2018-10-23 ENCOUNTER — Telehealth: Payer: Self-pay | Admitting: Cardiology

## 2018-10-23 NOTE — Telephone Encounter (Signed)
LVM for patient to call and reschedule new patient appointment with Dr. Gardiner Rhyme.  She also needs to have a negative COVID test before rescheduling this appointment.

## 2018-10-26 ENCOUNTER — Other Ambulatory Visit: Payer: Self-pay | Admitting: Emergency Medicine

## 2018-10-26 DIAGNOSIS — Z20822 Contact with and (suspected) exposure to covid-19: Secondary | ICD-10-CM

## 2018-10-27 LAB — NOVEL CORONAVIRUS, NAA: SARS-CoV-2, NAA: NOT DETECTED

## 2018-11-09 ENCOUNTER — Ambulatory Visit: Payer: BC Managed Care – PPO | Admitting: Podiatry

## 2018-11-09 ENCOUNTER — Other Ambulatory Visit: Payer: Self-pay

## 2018-11-09 ENCOUNTER — Encounter: Payer: Self-pay | Admitting: Podiatry

## 2018-11-09 DIAGNOSIS — M722 Plantar fascial fibromatosis: Secondary | ICD-10-CM

## 2018-11-09 DIAGNOSIS — M7752 Other enthesopathy of left foot: Secondary | ICD-10-CM

## 2018-11-09 MED ORDER — METHYLPREDNISOLONE 4 MG PO TBPK: ORAL_TABLET | ORAL | 0 refills | Status: DC

## 2018-11-09 NOTE — Progress Notes (Signed)
She presents today for a follow-up of her left heel states that is really been hurting me it hurts when I put my foot down to the floor.  Objective: Vital signs are stable alert and oriented x3.  Pulses are palpable.  She has pain on palpation of the sinus tarsi of the left foot and of the plantar fascia of the left foot.  Assessment: Plantar fasciitis sinus tarsitis.  Plan: Injected the sinus tarsi today subtalar joint with 20 mg Kenalog 5 mg Marcaine also injected the plantar fascia 20 mg Kenalog 5 mg Marcaine left heel.  Tolerated procedure well without complications.  Follow-up with her in 1 month also dispensed a prescription for methylprednisolone.

## 2018-11-27 ENCOUNTER — Other Ambulatory Visit: Payer: Self-pay

## 2018-11-27 ENCOUNTER — Ambulatory Visit: Payer: BC Managed Care – PPO | Admitting: Cardiology

## 2018-11-27 ENCOUNTER — Encounter: Payer: Self-pay | Admitting: Cardiology

## 2018-11-27 VITALS — BP 148/90 | HR 77 | Ht 65.0 in | Wt 154.6 lb

## 2018-11-27 DIAGNOSIS — G2581 Restless legs syndrome: Secondary | ICD-10-CM

## 2018-11-27 DIAGNOSIS — I872 Venous insufficiency (chronic) (peripheral): Secondary | ICD-10-CM

## 2018-11-27 NOTE — Progress Notes (Signed)
PCP: Antony Contras, MD  Clinic Note: Chief Complaint  Patient presents with  . New Admit To SNF    Left leg swelling    HPI:    Theresa Huff is a 68 y.o. female who is being seen today for the evaluation of LEG SWELLING at the request of [place], [place] T, [place]  (Podiatrist)  SHAKESHA JACOBI was referred by her Podiatrist for what appears to be lower extremity (mostly unilateral left leg swelling.  Does have some heaviness and fullness with the restless leg.  Recent Hospitalizations: No hospitalizations, however she had COVID-19 diagnosed on August 23 -> Sx were mostly headache, body ache, some congestion but no real shortness of breath/significant cough or fevers.  9/9 -- COVID + 10/1 - COVID 1  Reviewed  CV studies:    The following studies were reviewed today: (if available, images/films reviewed: From Epic Chart or Care Everywhere) . 2D Echo 03/2014: EF 55-60%.  No RWMA.  GR 1 DD.  Mild LA dilation.  Relatively normal valves . Lower extremity venous reflux Dopplers-left August 17, 2018: No evidence of DVT.  No indirect evidence of obstruction.  Abnormal reflux noted In GSV at mid calf, distal calf and proximal SSV.   Interval History:   Theresa Huff is here today referred because of this left lower extremity swelling.  She does not have any symptoms of bilateral edema, the right leg is a little swollen but nothing like the left.  No PND orthopnea.  No cardiac symptoms to speak of.  She denies any claudication symptoms. When she does note is having some left leg heaviness and fullness with what she thinks is probably restless leg at night.  Cardiovascular review of symptoms:  no chest pain or dyspnea on exertion positive for - Unilateral left > right LE swelling negative for - irregular heartbeat, orthopnea, palpitations, paroxysmal nocturnal dyspnea, rapid heart rate, shortness of breath or Syncope/near syncope, TIA/amaurosis fugax, claudication  The patient does not have  symptoms concerning for COVID-19 infection (fever, chills, cough, or new shortness of breath).  The patient is practicing social distancing. ++ Masking.  ++ Groceries/shopping.   REVIEWED OF SYSTEMS   ROS: A comprehensive was performed. Review of Systems  Constitutional: Negative for malaise/fatigue.  HENT: Negative for congestion and nosebleeds.   Cardiovascular: Positive for leg swelling (L leg as per HPI). Negative for chest pain and palpitations.  Gastrointestinal: Negative for blood in stool and melena.  Genitourinary: Negative for hematuria.  Musculoskeletal: Negative for falls, joint pain and myalgias.  Neurological: Negative for dizziness, focal weakness and headaches.   I have reviewed and (if needed) personally updated the patient's problem list, medications, allergies, past medical and surgical history, social and family history.   PAST MEDICAL HISTORY   Past Medical History:  Diagnosis Date  . Allergic rhinitis due to pollen   . Allergy   . Anemia    with pregnancy  . Breast cancer ([place])   . Cough   . Depressive disorder, not elsewhere classified   . Family history of melanoma   . GERD (gastroesophageal reflux disease)   . Hot flashes   . Insomnia, unspecified   . Melanoma of skin ([place]) 12/27/2017  . Personal history of melanoma in-situ   . Plantar fasciitis   . Radiation 09/09/14-10/17/14   Left chestwall/supraclav.  Marland Kitchen Restless leg syndrome 09/09/2010  . Skin cancer   . Wears glasses   . Weight gain      PAST  SURGICAL HISTORY   Past Surgical History:  Procedure Laterality Date  . COLONOSCOPY    . MASTECTOMY W/ SENTINEL NODE BIOPSY Left 03/07/2014   Procedure: LEFT TOTAL MASTECTOMY WITH LEFT SENTINEL LYMPH NODE BIOPSY;  Surgeon: Rolm Bookbinder, MD;  Location: [facility];  Service: General;  Laterality: Left;  Marland Kitchen MELANOMA EXCISION     right leg, left arm, right arm (several from each area)  . PORT-A-CATH REMOVAL N/A 12/04/2014   Procedure:  REMOVAL PORT-A-CATH;  Surgeon: Rolm Bookbinder, MD;  Location: [facility];  Service: General;  Laterality: N/A;  . PORTACATH PLACEMENT Right 04/01/2014   Procedure: INSERTION PORT-A-CATH;  Surgeon: Rolm Bookbinder, MD;  Location: [facility];  Service: General;  Laterality: Right;  . TONSILLECTOMY       MEDICATIONS/ALLERGIES   Current Meds  Medication Sig  . cetirizine (ZYRTEC) 10 MG tablet Take 10 mg by mouth daily.    . DULoxetine (CYMBALTA) 30 MG capsule   . fluticasone (FLONASE) 50 MCG/ACT nasal spray   . ibuprofen (ADVIL) 400 MG tablet TAKE 1 TABLET BY MOUTH EVERY 6 HOURS WITH FOOD AS NEEDED FOR PAIN  . letrozole (FEMARA) 2.5 MG tablet Take 1 tablet (2.5 mg total) by mouth daily.  Marland Kitchen LORazepam (ATIVAN) 2 MG tablet TAKE 1 TABLET BY MOUTH AT BEDTIME AS NEEDED FOR INSOMNIA  . omeprazole (PRILOSEC) 40 MG capsule Take by mouth daily.  . SUMAtriptan (IMITREX) 50 MG tablet TAKE 1 TABLET BY MOUTH EVERY 2 HOURS AS NEEDED FOR MIGRAINE MAY REPEAT IN 2 HRS IF HEADACHE PERSISTS  . tiZANidine (ZANAFLEX) 4 MG tablet Take 4 mg by mouth 3 (three) times daily as needed.  . Vitamin D, Ergocalciferol, (DRISDOL) 50000 UNITS CAPS capsule Take 50,000 Units by mouth every 7 (seven) days.    Allergies  Allergen Reactions  . Hydrocodone Nausea And Vomiting  . Tramadol Hives and Nausea And Vomiting  . Codeine Nausea And Vomiting  . Iodine Hives  . Radiaplexrx [Skin Protectants, Misc.] Other (See Comments)    Contraindicated on pharmacy profile  . Sulfa Antibiotics Hives  . Tape Other (See Comments)    Redness, skin tear  . Primidone Other (See Comments)    Pt had first dose effect.  More of a known sensitivity and not true allergy     SOCIAL HISTORY/FAMILY HISTORY   Social History   Tobacco Use  . Smoking status: Former Smoker    Packs/day: 0.20    Years: 6.00    Pack years: 1.20    Types: Cigarettes    Quit date: 01/25/1974    Years since quitting: 44.8  . Smokeless tobacco:  Never Used  Substance Use Topics  . Alcohol use: Yes    Alcohol/week: 2.0 standard drinks    Types: 2 Glasses of wine per week    Comment: 2 glasses wine per week  . Drug use: No   Social History   Social History Narrative   Patient is left-handed. She lives with her husband in a 2 story house. She drinks 1 glass of tea a day. She does not exercise.    family history includes Alcohol abuse in her father; Asthma in an other family member; Cancer in her cousin; Celiac disease in her daughter; Emphysema in her mother; Hashimoto's thyroiditis in her daughter; Heart disease in her mother; Heart failure in her mother; Liver disease in her father; Melanoma (age of onset: 5) in her brother.   OBJCTIVE -PE, EKG, labs   Wt Readings  from Last 3 Encounters:  11/27/18 154 lb 9.6 oz (70.1 kg)  07/19/17 162 lb (73.5 kg)  05/25/17 161 lb 14.4 oz (73.4 kg)    Physical Exam: BP (!) 148/90   Pulse 77   Ht 5\' 5"  (1.651 m)   Wt 154 lb 9.6 oz (70.1 kg)   SpO2 99%   BMI 25.73 kg/m  Physical Exam  Constitutional: She is oriented to person, place, and time. She appears well-developed and well-nourished. No distress.  HENT:  Head: Normocephalic and atraumatic.  Neck: Normal range of motion. Neck supple. No JVD present.  Cardiovascular: Normal rate, regular rhythm, normal heart sounds and intact distal pulses. Exam reveals no gallop and no friction rub.  No murmur heard. Pulmonary/Chest: Effort normal and breath sounds normal. No respiratory distress. She has no wheezes. She has no rales.  Musculoskeletal:        General: Edema (1-2+ nonpitting below the knee swelling on the left leg, trace to 1+ on the right.  ) present.  Neurological: She is alert and oriented to person, place, and time.  Psychiatric: She has a normal mood and affect. Her behavior is normal. Judgment and thought content normal.  Vitals reviewed.    Adult ECG Report  Rate: 77 ;  Rhythm: normal sinus rhythm and Refract.   Possible septal infarct, age undetermined.  Otherwise normal axis, normal durations.;   Narrative Interpretation: Relatively normal   Recent Labs: Lipid panel from January 2020-TC 226, TG 149, HDL 46, LDL 150.   ASSESSMENT/PLAN    Problem List Items Addressed This Visit    Restless leg syndrome - Primary   Relevant Orders   EKG 12-Lead (Completed)    Other Visit Diagnoses    Venous (peripheral) insufficiency       Relevant Orders   EKG 12-Lead (Completed)     SAAHITHI KOVACEVIC has unilateral leg swelling that is not overly symptomatic.  Not associated with any heart failure symptoms.  Most likely related to venous stasis.  She does have some small varicose veins but no large patulous veins noted.  No real significant spider veins either.  Dopplers did show GSV reflux.  Plan: I think suggested support stockings and foot elevation.  If symptoms can continue to worsen, would be recommended referral to Dr. Sherren Mocha Early from\surgery to discuss potential venous ablation.   COVID-19 Education: The signs and symptoms of COVID-19 were discussed with the patient and how to seek care for testing (follow up with PCP or arrange E-visit).   The importance of social distancing was discussed today.  I spent a total of 30minutes with the patient and chart review. >  50% of the time was spent in direct patient consultation.  Additional time spent with chart review (studies, outside notes, etc): 5 Total Time: 20 min   Current medicines are reviewed at length with the patient today.  (+/- concerns) none   Patient Instructions / Medication Changes & Studies & Tests Ordered   Patient Instructions  Medication Instructions:  No changes      Lab Work: not needed Testing/Procedures: Not needed  Follow-Up: At [facility], you and your health needs are our priority.  As part of our continuing mission to provide you with exceptional heart care, we have created designated Provider Care Teams.   These Care Teams include your primary Cardiologist (physician) and Advanced Practice Providers (APPs -  Physician Assistants and Nurse Practitioners) who all work together to provide you with the care you need,  when you need it.  Your next appointment:   as needed  The format for your next appointment:    as needed -- in persion  Provider:   Dr Ellyn Hack  Other Instructions  Elevate  Legs - do not lock your knees  Purchase some support socks or  Hoses  15- 20 mmHg    if symptoms become worse - you would need to contact Dr Early   Vascular and vein specialist _ [address]      Studies Ordered:   Orders Placed This Encounter  Procedures  . EKG 12-Lead     Glenetta Hew, M.D., M.S. Interventional Cardiologist   Pager # [phone] Phone # [phone] [address] [place], [place] [address]   Thank you for choosing Heartcare at [facility]!!

## 2018-11-27 NOTE — Patient Instructions (Signed)
Medication Instructions:  No changes      Lab Work: not needed Testing/Procedures: Not needed  Follow-Up: At [facility], you and your health needs are our priority.  As part of our continuing mission to provide you with exceptional heart care, we have created designated Provider Care Teams.  These Care Teams include your primary Cardiologist (physician) and Advanced Practice Providers (APPs -  Physician Assistants and Nurse Practitioners) who all work together to provide you with the care you need, when you need it.  Your next appointment:   as needed  The format for your next appointment:    as needed -- in persion  Provider:   Dr Ellyn Hack  Other Instructions  Elevate  Legs - do not lock your knees  Purchase some support socks or  Hoses  15- 20 mmHg    if symptoms become worse - you would need to contact Dr Early   Vascular and vein specialist _ [address]

## 2018-11-29 ENCOUNTER — Encounter: Payer: Self-pay | Admitting: Cardiology

## 2018-12-06 DIAGNOSIS — R030 Elevated blood-pressure reading, without diagnosis of hypertension: Secondary | ICD-10-CM | POA: Diagnosis not present

## 2018-12-06 DIAGNOSIS — M545 Low back pain: Secondary | ICD-10-CM | POA: Diagnosis not present

## 2018-12-06 DIAGNOSIS — Z6825 Body mass index (BMI) 25.0-25.9, adult: Secondary | ICD-10-CM | POA: Diagnosis not present

## 2018-12-19 ENCOUNTER — Ambulatory Visit: Payer: BC Managed Care – PPO | Admitting: Podiatry

## 2018-12-19 ENCOUNTER — Other Ambulatory Visit: Payer: Self-pay

## 2018-12-19 ENCOUNTER — Encounter: Payer: Self-pay | Admitting: Podiatry

## 2018-12-19 DIAGNOSIS — M25572 Pain in left ankle and joints of left foot: Secondary | ICD-10-CM | POA: Diagnosis not present

## 2018-12-19 DIAGNOSIS — Z981 Arthrodesis status: Secondary | ICD-10-CM | POA: Diagnosis not present

## 2018-12-19 DIAGNOSIS — M722 Plantar fascial fibromatosis: Secondary | ICD-10-CM

## 2018-12-19 DIAGNOSIS — Z8262 Family history of osteoporosis: Secondary | ICD-10-CM | POA: Diagnosis not present

## 2018-12-19 DIAGNOSIS — M8589 Other specified disorders of bone density and structure, multiple sites: Secondary | ICD-10-CM | POA: Diagnosis not present

## 2018-12-19 DIAGNOSIS — Z853 Personal history of malignant neoplasm of breast: Secondary | ICD-10-CM | POA: Diagnosis not present

## 2018-12-19 NOTE — Progress Notes (Signed)
She presents today chief complaint states that her swelling is doing much better after she saw the vascular surgeon she continues to wear compression socks dislike she does her compression sleeve on her arm.  Objective: Vital signs are stable she is alert and oriented x3.  Pulses are palpable.  She is pain on palpation medial care tubercle of the left heel and of the sinus tarsi area.  Assessment: Sinus tarsitis plantar fasciitis left.  Plan: I injected these areas today with dexamethasone laterally and Kenalog immediately.  Tolerated procedure well follow-up with her in a month.

## 2019-01-01 ENCOUNTER — Telehealth: Payer: Self-pay | Admitting: Medical Oncology

## 2019-01-01 NOTE — Telephone Encounter (Signed)
[name]: 25-month follow up: phone call  I spoke with patient for the 30 month follow-up on study. Patient confirms to be doing well. Patient denies having any issues or concerns. Patient confirms to be taking letrozole daily. Last mammogram was in January 2020, mammogram for 2021 has not been scheduled at the time of this call. Patient denies SAE's or new anti-cancer medications. Patient was informed that there were no questionnaires for this time period and to expect the next ones at the 36 month study time period, and these will be mailed to her as they have been thus far. Patient does have an appointment scheduled with Dr. Lindi Adie in May of 2021, for the 45-month visit.  Patient denied having any questions at this time.  Patient was thanked for her time and continued support of study and encouraged to call with questions.  Adele Dan, RN, BSN Clinical Research 01/01/2019 3:54 PM

## 2019-01-08 DIAGNOSIS — F329 Major depressive disorder, single episode, unspecified: Secondary | ICD-10-CM | POA: Diagnosis not present

## 2019-01-08 DIAGNOSIS — L719 Rosacea, unspecified: Secondary | ICD-10-CM | POA: Diagnosis not present

## 2019-01-08 DIAGNOSIS — L570 Actinic keratosis: Secondary | ICD-10-CM | POA: Diagnosis not present

## 2019-01-08 DIAGNOSIS — K219 Gastro-esophageal reflux disease without esophagitis: Secondary | ICD-10-CM | POA: Diagnosis not present

## 2019-01-08 DIAGNOSIS — G4700 Insomnia, unspecified: Secondary | ICD-10-CM | POA: Diagnosis not present

## 2019-01-08 DIAGNOSIS — J309 Allergic rhinitis, unspecified: Secondary | ICD-10-CM | POA: Diagnosis not present

## 2019-01-14 DIAGNOSIS — H01009 Unspecified blepharitis unspecified eye, unspecified eyelid: Secondary | ICD-10-CM | POA: Diagnosis not present

## 2019-01-16 DIAGNOSIS — H00011 Hordeolum externum right upper eyelid: Secondary | ICD-10-CM | POA: Diagnosis not present

## 2019-01-16 DIAGNOSIS — H00021 Hordeolum internum right upper eyelid: Secondary | ICD-10-CM | POA: Diagnosis not present

## 2019-01-18 DIAGNOSIS — E559 Vitamin D deficiency, unspecified: Secondary | ICD-10-CM | POA: Diagnosis not present

## 2019-01-18 DIAGNOSIS — E7800 Pure hypercholesterolemia, unspecified: Secondary | ICD-10-CM | POA: Diagnosis not present

## 2019-02-20 ENCOUNTER — Ambulatory Visit: Payer: BC Managed Care – PPO | Admitting: Podiatry

## 2019-03-05 ENCOUNTER — Encounter: Payer: Self-pay | Admitting: Hematology and Oncology

## 2019-03-05 DIAGNOSIS — Z1231 Encounter for screening mammogram for malignant neoplasm of breast: Secondary | ICD-10-CM | POA: Diagnosis not present

## 2019-03-13 ENCOUNTER — Other Ambulatory Visit: Payer: Self-pay | Admitting: Hematology and Oncology

## 2019-03-13 MED ORDER — SUMATRIPTAN SUCCINATE 50 MG PO TABS: ORAL_TABLET | ORAL | 0 refills | Status: DC

## 2019-03-19 DIAGNOSIS — M25562 Pain in left knee: Secondary | ICD-10-CM | POA: Diagnosis not present

## 2019-03-21 ENCOUNTER — Encounter: Payer: Self-pay | Admitting: Orthopaedic Surgery

## 2019-03-21 ENCOUNTER — Other Ambulatory Visit: Payer: Self-pay

## 2019-03-21 ENCOUNTER — Ambulatory Visit: Payer: Self-pay

## 2019-03-21 ENCOUNTER — Ambulatory Visit: Payer: BC Managed Care – PPO | Admitting: Orthopaedic Surgery

## 2019-03-21 DIAGNOSIS — M25562 Pain in left knee: Secondary | ICD-10-CM | POA: Diagnosis not present

## 2019-03-21 MED ORDER — LIDOCAINE HCL 1 % IJ SOLN
3.0000 mL | INTRAMUSCULAR | Status: AC | PRN
  Administered 2019-03-21: 3 mL

## 2019-03-21 MED ORDER — METHYLPREDNISOLONE ACETATE 40 MG/ML IJ SUSP
40.0000 mg | INTRAMUSCULAR | Status: AC | PRN
  Administered 2019-03-21: 40 mg via INTRA_ARTICULAR

## 2019-03-21 NOTE — Progress Notes (Signed)
Office Visit Note   Patient: Theresa Huff           Date of Birth: 04/19/1950           MRN: [id number] Visit Date: 03/21/2019              Requested by: Antony Contras, MD [place],  [place] [address] PCP: Antony Contras, MD   Assessment & Plan: Visit Diagnoses:  1. Acute pain of left knee     Plan: Thus far I do feel it is worth trying a steroid injection in her left knee to try to treat her acute pain and the inflammation that is going on.  I am concerned about the mechanical symptoms though.  I would like to see her back in 2 weeks to see how she tolerated this injection.  She may be a candidate at some point for hyaluronic acid.  I will have her try quad training exercises twice daily as well.  All question concerns were answered addressed.  She does understand the rationale behind steroid injections and the risk and benefits involved.  She did tolerate it well.  We will see how she is doing in 2 weeks.  Follow-Up Instructions: Return in about 2 weeks (around 04/04/2019).   Orders:  Orders Placed This Encounter  Procedures  . Large Joint Inj  . XR Knee 1-2 Views Left   No orders of the defined types were placed in this encounter.     Procedures: Large Joint Inj: L knee on 03/21/2019 9:59 AM Indications: diagnostic evaluation and pain Details: 22 G 1.5 in needle, superolateral approach  Arthrogram: No  Medications: 3 mL lidocaine 1 %; 40 mg methylPREDNISolone acetate 40 MG/ML Outcome: tolerated well, no immediate complications Procedure, treatment alternatives, risks and benefits explained, specific risks discussed. Consent was given by the patient. Immediately prior to procedure a time out was called to verify the correct patient, procedure, equipment, support staff and site/side marked as required. Patient was prepped and draped in the usual sterile fashion.       Clinical Data: No additional findings.   Subjective: Chief Complaint    Patient presents with  . Left Knee - Pain  The patient is a very pleasant 69 year old female who comes in for evaluation treatment of left knee pain is been going on for about a month now and slowly getting worse.  She says her knee pops.  She was taking her compression hose often has gotten to the point where she cannot extend her knee well on that left knee.  It hurts more with extension and hyperextension.  She denies any previous surgery on her left knee and has had noted left knee issues in the past.  Her right knee is normal she states.  She is not a diabetic.  She has had no other acute changes in her medical status.  HPI  Review of Systems She currently denies any headache, chest pain, shortness of breath, fever, chills, nausea, vomiting  Objective: Vital Signs: There were no vitals taken for this visit.  Physical Exam She is alert and oriented x3 and in no acute distress Ortho Exam Examination of both knees show neither knee has an effusion.  Both knees are ligamentously stable.  Her left knee does hurt on hyperextension.  Her McMurray's and Lachman's exam on the left knee are normal. Specialty Comments:  No specialty comments available.  Imaging: XR Knee 1-2 Views Left  Result Date: 03/21/2019  2 views of the left knee show no acute findings.  There is some slight arthritic changes in the medial compartment.  There is more moderate arthritic changes at the patellofemoral joint.  There is no knee joint effusion.    PMFS History: Patient Active Problem List   Diagnosis Date Noted  . 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 Genetic testing 01/27/2018  . Melanoma of skin ([place]) 12/27/2017  . Family history of melanoma   . Personal history of melanoma in-situ   . Essential tremor 07/19/2017  . Migraine 05/06/2014  . Breast cancer of upper-outer quadrant of left female breast ([place]) 02/06/2014  . OSA (obstructive sleep apnea) 09/09/2010  . Restless leg syndrome  09/09/2010  . Depressive disorder, not elsewhere classified   . Asthma, cough variant   . Insomnia, unspecified   . Allergic rhinitis due to pollen    Past Medical History:  Diagnosis Date  . Allergic rhinitis due to pollen   . Allergy   . Anemia    with pregnancy  . Breast cancer ([facility])   . Cough   . Depressive disorder, not elsewhere classified   . Family history of melanoma   . GERD (gastroesophageal reflux disease)   . Hot flashes   . Insomnia, unspecified   . Melanoma of skin ([place] [facility]) 12/27/2017  . Personal history of melanoma in-situ   . Plantar fasciitis   . Radiation 09/09/14-10/17/14   Left chestwall/supraclav.  Marland Kitchen Restless leg syndrome 09/09/2010  . Skin cancer   . Wears glasses   . Weight gain     Family History  Problem Relation Age of Onset  . Alcohol abuse Father   . Liver disease Father   . Emphysema Mother   . Heart disease Mother   . Heart failure Mother   . Melanoma Brother 30       more than 3, unk exact number  . Celiac disease Daughter   . Hashimoto's thyroiditis Daughter   . Asthma Other        grandson  . Cancer Cousin        maybe had cancer- type unk    Past Surgical History:  Procedure Laterality Date  . COLONOSCOPY    . MASTECTOMY W/ SENTINEL NODE BIOPSY Left 03/07/2014   Procedure: LEFT TOTAL MASTECTOMY WITH LEFT SENTINEL LYMPH NODE BIOPSY;  Surgeon: Rolm Bookbinder, MD;  Location: [facility];  Service: General;  Laterality: Left;  Marland Kitchen MELANOMA EXCISION     right leg, left arm, right arm (several from each area)  . PORT-A-CATH REMOVAL N/A 12/04/2014   Procedure: REMOVAL PORT-A-CATH;  Surgeon: Rolm Bookbinder, MD;  Location: [facility];  Service: General;  Laterality: N/A;  . PORTACATH PLACEMENT Right 04/01/2014   Procedure: INSERTION PORT-A-CATH;  Surgeon: Rolm Bookbinder, MD;  Location: [facility];  Service: General;  Laterality: Right;  . TONSILLECTOMY     Social History   Occupational History  . Occupation:  Aeronautical engineer: [employer]  Tobacco Use  . Smoking status: Former Smoker    Packs/day: 0.20    Years: 6.00    Pack years: 1.20    Types: Cigarettes    Quit date: 01/25/1974    Years since quitting: 45.1  . Smokeless tobacco: Never Used  Substance and Sexual Activity  . Alcohol use: Yes    Alcohol/week: 2.0 standard drinks    Types: 2 Glasses of wine per week    Comment:  2 glasses wine per week  . Drug use: No  . Sexual activity: Never

## 2019-04-03 ENCOUNTER — Encounter: Payer: Self-pay | Admitting: Orthopaedic Surgery

## 2019-04-03 ENCOUNTER — Other Ambulatory Visit: Payer: Self-pay

## 2019-04-03 ENCOUNTER — Ambulatory Visit (INDEPENDENT_AMBULATORY_CARE_PROVIDER_SITE_OTHER): Payer: Medicare Other | Admitting: Orthopaedic Surgery

## 2019-04-03 DIAGNOSIS — M25562 Pain in left knee: Secondary | ICD-10-CM

## 2019-04-03 NOTE — Progress Notes (Signed)
The patient is a 69 year old active female who is 2 weeks out from US examining her knee and placing a steroid injection in the left knee.  She had been having mechanical symptoms.  This was locking and catching as well as pain.  Her plain films showed only mild arthritic changes in the knee.  She states the steroid injection and Voltaren gel have helped quite a bit.  She is feeling better but not 100%.  She denies any locking catching but she does hurt with pivoting activities with her left knee on the medial joint line.  On examination today there is no knee joint effusion of the left knee.  Her range of motion is full.  She does have some slight medial joint line tenderness and some irritation of the medial compartment with attempted McMurray's exam.  Her Lachman's is negative.  Since she is doing better with this clinic continue to watch her clinically.  She will try to avoid high impact aerobic activities as well as watching how she pivots her left knee.  We can always repeat an injection in 2 months from now if needed.  Also, if her mechanical symptoms persist she can call because we would order an MRI to rule out a meniscal tear.  All question concerns were answered and addressed.

## 2019-04-04 ENCOUNTER — Ambulatory Visit: Payer: BC Managed Care – PPO | Admitting: Orthopaedic Surgery

## 2019-05-01 ENCOUNTER — Other Ambulatory Visit: Payer: Self-pay

## 2019-05-01 ENCOUNTER — Encounter: Payer: Self-pay | Admitting: Orthopaedic Surgery

## 2019-05-01 ENCOUNTER — Ambulatory Visit (INDEPENDENT_AMBULATORY_CARE_PROVIDER_SITE_OTHER): Payer: Medicare Other

## 2019-05-01 ENCOUNTER — Ambulatory Visit (INDEPENDENT_AMBULATORY_CARE_PROVIDER_SITE_OTHER): Payer: Medicare Other | Admitting: Orthopaedic Surgery

## 2019-05-01 DIAGNOSIS — S82044A Nondisplaced comminuted fracture of right patella, initial encounter for closed fracture: Secondary | ICD-10-CM

## 2019-05-01 DIAGNOSIS — M25561 Pain in right knee: Secondary | ICD-10-CM | POA: Diagnosis not present

## 2019-05-01 MED ORDER — ONDANSETRON 4 MG PO TBDP: 4.0000 mg | ORAL_TABLET | Freq: Three times a day (TID) | ORAL | 0 refills | Status: DC | PRN

## 2019-05-01 MED ORDER — HYDROCODONE-ACETAMINOPHEN 5-325 MG PO TABS: 1.0000 | ORAL_TABLET | Freq: Four times a day (QID) | ORAL | 0 refills | Status: DC | PRN

## 2019-05-01 NOTE — Progress Notes (Signed)
Office Visit Note   Patient: Theresa Huff           Date of Birth: [birth year]           MRN: [id number] Visit Date: 05/01/2019              Requested by: Antony Contras, MD [place],  [place] [id number] PCP: Antony Contras, MD   Assessment & Plan: Visit Diagnoses:  1. Acute pain of right knee   2. Closed nondisplaced comminuted fracture of right patella, initial encounter     Plan: I did go over the patient's x-rays with her in detail showing her her comminuted patella fracture.  Given that this is not really displaced we can treat this hopefully in just a knee immobilizer with limiting flexion of her knee.  She agrees with this treatment plan.  She can weight-bear as tolerated and I want her to limit flexion of her knee which she understands completely.  We will place her in a knee immobilizer today and I would like to see her back in just 1 week for repeat lateral of the right knee that should be done with the patient supine.  All question concerns were answered addressed.  I will send in some hydrocodone for pain.  Follow-Up Instructions: Return in about 1 week (around 05/08/2019).   Orders:  Orders Placed This Encounter  Procedures  . XR KNEE 3 VIEW RIGHT   No orders of the defined types were placed in this encounter.     Procedures: No procedures performed   Clinical Data: No additional findings.   Subjective: Chief Complaint  Patient presents with  . Right Knee - Pain  Patient is a very pleasant 69 year old female well-known to me.  She actually fell this past Saturday landing directly on her right knee.  Since then her right knee has been very painful and swollen.  She has not been able to sleep well due to the pain of her knee and feeling like it is going to give out.  She has not injured this knee before.  She denies any other injuries.  She denies any other acute changes in her medical status.  HPI  Review of Systems She denies any  headache, chest pain, shortness of breath, fever, chills, nausea, vomiting  Objective: Vital Signs: There were no vitals taken for this visit.  Physical Exam She is alert and orient x3 and in no acute distress Ortho Exam Examination of her right knee shows a moderate knee joint effusion.  There is significant pain to palpation over the patella.  Her extensor mechanism is intact.  Her Lachman's exam is negative. Specialty Comments:  No specialty comments available.  Imaging: XR KNEE 3 VIEW RIGHT  Result Date: 05/01/2019 3 views of the right knee show a nondisplaced comminuted patella fracture.    PMFS History: Patient Active Problem List   Diagnosis Date Noted  . 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 Genetic testing 01/27/2018  . Melanoma of skin ([facility]) 12/27/2017  . Family history of melanoma   . Personal history of melanoma in-situ   . Essential tremor 07/19/2017  . Migraine 05/06/2014  . Breast cancer of upper-outer quadrant of left female breast ([place]) 02/06/2014  . OSA (obstructive sleep apnea) 09/09/2010  . Restless leg syndrome 09/09/2010  . Depressive disorder, not elsewhere classified   . Asthma, cough variant   . Insomnia, unspecified   . Allergic rhinitis due to pollen  Past Medical History:  Diagnosis Date  . Allergic rhinitis due to pollen   . Allergy   . Anemia    with pregnancy  . Breast cancer ([facility])   . Cough   . Depressive disorder, not elsewhere classified   . Family history of melanoma   . GERD (gastroesophageal reflux disease)   . Hot flashes   . Insomnia, unspecified   . Melanoma of skin ([place]) 12/27/2017  . Personal history of melanoma in-situ   . Plantar fasciitis   . Radiation 09/09/14-10/17/14   Left chestwall/supraclav.  Marland Kitchen Restless leg syndrome 09/09/2010  . Skin cancer   . Wears glasses   . Weight gain     Family History  Problem Relation Age of Onset  . Alcohol abuse Father   . Liver disease Father   . Emphysema Mother    . Heart disease Mother   . Heart failure Mother   . Melanoma Brother 30       more than 3, unk exact number  . Celiac disease Daughter   . Hashimoto's thyroiditis Daughter   . Asthma Other        grandson  . Cancer Cousin        maybe had cancer- type unk    Past Surgical History:  Procedure Laterality Date  . COLONOSCOPY    . MASTECTOMY W/ SENTINEL NODE BIOPSY Left 03/07/2014   Procedure: LEFT TOTAL MASTECTOMY WITH LEFT SENTINEL LYMPH NODE BIOPSY;  Surgeon: Rolm Bookbinder, MD;  Location: [facility];  Service: General;  Laterality: Left;  Marland Kitchen MELANOMA EXCISION     right leg, left arm, right arm (several from each area)  . PORT-A-CATH REMOVAL N/A 12/04/2014   Procedure: REMOVAL PORT-A-CATH;  Surgeon: Rolm Bookbinder, MD;  Location: [facility];  Service: General;  Laterality: N/A;  . PORTACATH PLACEMENT Right 04/01/2014   Procedure: INSERTION PORT-A-CATH;  Surgeon: Rolm Bookbinder, MD;  Location: [facility];  Service: General;  Laterality: Right;  . TONSILLECTOMY     Social History   Occupational History  . Occupation: Aeronautical engineer: [employer]  Tobacco Use  . Smoking status: Former Smoker    Packs/day: 0.20    Years: 6.00    Pack years: 1.20    Types: Cigarettes    Quit date: 01/25/1974    Years since quitting: 45.2  . Smokeless tobacco: Never Used  Substance and Sexual Activity  . Alcohol use: Yes    Alcohol/week: 2.0 standard drinks    Types: 2 Glasses of wine per week    Comment: 2 glasses wine per week  . Drug use: No  . Sexual activity: Never

## 2019-05-06 ENCOUNTER — Other Ambulatory Visit: Payer: Self-pay | Admitting: Hematology and Oncology

## 2019-05-09 ENCOUNTER — Encounter: Payer: Self-pay | Admitting: Orthopaedic Surgery

## 2019-05-09 ENCOUNTER — Ambulatory Visit (INDEPENDENT_AMBULATORY_CARE_PROVIDER_SITE_OTHER): Payer: Medicare Other

## 2019-05-09 ENCOUNTER — Other Ambulatory Visit: Payer: Self-pay

## 2019-05-09 ENCOUNTER — Ambulatory Visit (INDEPENDENT_AMBULATORY_CARE_PROVIDER_SITE_OTHER): Payer: Medicare Other | Admitting: Orthopaedic Surgery

## 2019-05-09 DIAGNOSIS — S82044A Nondisplaced comminuted fracture of right patella, initial encounter for closed fracture: Secondary | ICD-10-CM

## 2019-05-09 MED ORDER — TIZANIDINE HCL 4 MG PO TABS: 4.0000 mg | ORAL_TABLET | Freq: Three times a day (TID) | ORAL | 1 refills | Status: DC | PRN

## 2019-05-09 NOTE — Progress Notes (Signed)
Office Visit Note   Patient: Theresa Huff           Date of Birth: 18-Dec-1950           MRN: [id number] Visit Date: 05/09/2019              Requested by: Antony Contras, MD [place],   [id number] PCP: Antony Contras, MD   Assessment & Plan: Visit Diagnoses:  1. Closed nondisplaced comminuted fracture of right patella, initial encounter     Plan: She will continue the knee immobilizer taken off for bathing whenever she is just sitting around.  Refrain from any flexion of the knee.  We will see her back in 4 weeks at that time we will obtain a single lateral view of the right knee.  Most likely at that point time we will have her start bending the knee and discontinuing the knee immobilizer.  Questions encouraged and answered.  Follow-Up Instructions: Return in about 4 weeks (around 06/06/2019) for Radiographs.   Orders:  Orders Placed This Encounter  Procedures  . XR Knee 1-2 Views Right   Meds ordered this encounter  Medications  . tiZANidine (ZANAFLEX) 4 MG tablet    Sig: Take 1 tablet (4 mg total) by mouth 3 (three) times daily as needed.    Dispense:  40 tablet    Refill:  1      Procedures: No procedures performed   Clinical Data: No additional findings.   Subjective: Chief Complaint  Patient presents with  . Right Knee - Follow-up    HPI:  Approximately 2 weeks status post injury with a nondisplaced right knee patella fracture.  She is overall doing well with use knee immobilizer.Patient unable to take hydrocodone for knee pain.  She is asking for Zanaflex as she has found this beneficial with her knee pain.  Review of Systems See HPI  Objective: Vital Signs: There were no vitals taken for this visit.  Physical Exam Constitutional:      Appearance: She is not ill-appearing.  Neurological:     Mental Status: She is alert and oriented to person, place, and time.  Psychiatric:        Behavior: Behavior normal.      Ortho Exam  Right calf supple nontender.  Specialty Comments:  No specialty comments available.  Imaging: XR Knee 1-2 Views Right  Result Date: 05/09/2019 Right knee lateral view: Fracture remains unchanged in overall position alignment.  No other fractures identified.    PMFS History: Patient Active Problem List   Diagnosis Date Noted  . 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 Genetic testing 01/27/2018  . Melanoma of skin ([facility]) 12/27/2017  . Family history of melanoma   . Personal history of melanoma in-situ   . Essential tremor 07/19/2017  . Migraine 05/06/2014  . Breast cancer of upper-outer quadrant of left female breast ([facility]) 02/06/2014  . OSA (obstructive sleep apnea) 09/09/2010  . Restless leg syndrome 09/09/2010  . Depressive disorder, not elsewhere classified   . Asthma, cough variant   . Insomnia, unspecified   . Allergic rhinitis due to pollen    Past Medical History:  Diagnosis Date  . Allergic rhinitis due to pollen   . Allergy   . Anemia    with pregnancy  . Breast cancer ([facility])   . Cough   . Depressive disorder, not elsewhere classified   . Family history of melanoma   . GERD (gastroesophageal  reflux disease)   . Hot flashes   . Insomnia, unspecified   . Melanoma of skin ([facility]) 12/27/2017  . Personal history of melanoma in-situ   . Plantar fasciitis   . Radiation 09/09/14-10/17/14   Left chestwall/supraclav.  Marland Kitchen Restless leg syndrome 09/09/2010  . Skin cancer   . Wears glasses   . Weight gain     Family History  Problem Relation Age of Onset  . Alcohol abuse Father   . Liver disease Father   . Emphysema Mother   . Heart disease Mother   . Heart failure Mother   . Melanoma Brother 30       more than 3, unk exact number  . Celiac disease Daughter   . Hashimoto's thyroiditis Daughter   . Asthma Other        grandson  . Cancer Cousin        maybe had cancer- type unk    Past Surgical History:  Procedure Laterality Date  .  COLONOSCOPY    . MASTECTOMY W/ SENTINEL NODE BIOPSY Left 03/07/2014   Procedure: LEFT TOTAL MASTECTOMY WITH LEFT SENTINEL LYMPH NODE BIOPSY;  Surgeon: Rolm Bookbinder, MD;  Location: [facility];  Service: General;  Laterality: Left;  Marland Kitchen MELANOMA EXCISION     right leg, left arm, right arm (several from each area)  . PORT-A-CATH REMOVAL N/A 12/04/2014   Procedure: REMOVAL PORT-A-CATH;  Surgeon: Rolm Bookbinder, MD;  Location: [facility];  Service: General;  Laterality: N/A;  . PORTACATH PLACEMENT Right 04/01/2014   Procedure: INSERTION PORT-A-CATH;  Surgeon: Rolm Bookbinder, MD;  Location: [facility];  Service: General;  Laterality: Right;  . TONSILLECTOMY     Social History   Occupational History  . Occupation: Aeronautical engineer: [employer]   Tobacco Use  . Smoking status: Former Smoker    Packs/day: 0.20    Years: 6.00    Pack years: 1.20    Types: Cigarettes    Quit date: 01/25/1974    Years since quitting: 45.3  . Smokeless tobacco: Never Used  Substance and Sexual Activity  . Alcohol use: Yes    Alcohol/week: 2.0 standard drinks    Types: 2 Glasses of wine per week    Comment: 2 glasses wine per week  . Drug use: No  . Sexual activity: Never

## 2019-05-24 ENCOUNTER — Telehealth: Payer: Self-pay | Admitting: Medical Oncology

## 2019-05-24 NOTE — Telephone Encounter (Signed)
[name] Call to patient regarding her appointment in clinic with MD next week. I informed patient that the study has a new updated consent form Version 8, dated 03/14/2019 that I need to review with her and re-consent her to. I inquired with patient if I could e-mail to her the updated consent with the highlighted changes so that she can review at home, patient agreed and confirmed her e-mail address. Patient denied any questions at this time. I thanked patient for her time and continued support of study and informed her I will see her next week when in clinic. Patient encouraged to call with questions.  AFT ICF Version 8, dated 03/14/2019 e-mailed to patient for her review.  Maxwell Marion, RN, BSN, [facility] 05/24/2019 3:27 PM

## 2019-05-30 NOTE — Progress Notes (Addendum)
Patient Care Team: Antony Contras, MD as PCP - General (Family Medicine) Elsie Stain, MD (Pulmonary Disease) Rolm Bookbinder, MD as Consulting Physician (General Surgery) Nicholas Lose, MD as Consulting Physician (Hematology and Oncology) Rockwell Germany, RN as Registered Nurse Mauro Kaufmann, RN as Registered Nurse Causey, Charlestine Massed, NP as Nurse Practitioner (Hematology and Oncology) Maxwell Marion, RN as Registered Nurse (Medical Oncology)  DIAGNOSIS:    ICD-10-CM   1. Malignant neoplasm of upper-outer quadrant of left breast in female, estrogen receptor positive ([place])  C50.412    Z17.0     SUMMARY OF ONCOLOGIC HISTORY: Oncology History  Breast cancer of upper-outer quadrant of left female breast ([place])  01/30/2014 Mammogram   Left breast: 2.4 cm irregular high density mass with indistinct margin, middle depth, 6.8 cm from nipple, with architectural distortion. There is also a 3.5 cm irregular high density asymmetry at 1:00, anterior depth, with arch distortion and thickening.   01/30/2014 Breast US   Left breast: 3.7 cm irregular mass with indistinct margin at 2:00, posterior deth, 4 cm from nipple, hypoechoic. 10 x 1 cm irregular mass with lobulated and indistinct margin at 3:00, posterior depth. A third 0.9 cm lobulated mass at 1:00, posterior depth   02/04/2014 Initial Biopsy   Left breast core needle bx x 3: Invasive ductal carcinoma with DCIS ER 100%, PR 100%, HER-2 negative, Ki-67 ranged from 19-26%; third biopsy showed invasive mammary cancer with lobular features and intraductal papilloma, ER/PR positive HER-2 negative   02/11/2014 Breast MRI   Left breast: 3 masses that are confluent measuring 5.6 x 2.7 x 3 cm extending to the left aspect of the areola, appearing to involve the skin of theareola, deviating the nipple towards the left.  No axillary findings.  Neg rightt breast.   02/13/2014 Clinical Stage   Stage IIB: T3 N0   03/07/2014 Definitive Surgery    Left mastectomy/SLNB: invasive ductal carcinoma grade 1; 4.8 cm with low-grade DCIS; margins negative 1/4 lymph nodes positive, ER 100% PR 100% HER-2 negative ratio 1.18 Ki-67 26% and 19%    03/07/2014 Pathologic Stage   Stage IIB (T2 N1)   04/03/2014 - 08/13/2014 Chemotherapy   Adjuvant chemotherapy with dose dense adriamycin and cytoxan followed by weekly abraxane 12   09/09/2014 - 10/17/2014 Radiation Therapy   Adjuvant RT [name]): Left chest wall 50.4 Gy over 28 fractions. Left supraclavicular fossa and axilla: 45 Gy over 25 fractions   10/27/2014 -  Anti-estrogen oral therapy   Anastrozole 1 mg daily plus Ibrance ([name] Clinical Trial stopped 06/11/16)   01/16/2015 Survivorship   Survivorship visit completed and copy of care plan provided to patient.   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   A variant of uncertain significance (VUS) in a gene called MLH1 was also noted. c.622C>T (p.Pro208Ser)  The Multi-Cancer Panel offered by [facility] includes sequencing and/or deletion duplication testing of the following 90 genes: AIP, ALK, APC, ATM, AXIN2, BAP1, BARD1, BLM, BMPR1A, BRCA1, BRCA2, BRIP1, BUB1B, CASR, CDC73, CDH1, CDK4, CDKN1B, CDKN1C, CDKN2A, CEBPA, CHEK2, CTNNA1, DICER1, DIS3L2, EGFR, ENG, EPCAM, FH, FLCN, GALNT12, GATA2, GPC3, GREM1, HOXB13, HRAS, KIT, MAX, MEN1, MET, MITF, MLH1, MLH3, MSH2, MSH3, MSH6, MUTYH, NBN, NF1, NF2, NTHL1, PALB2, PDGFRA, PHOX2B, PMS2, POLD1, POLE, POT1, PRKAR1A, PTCH1, PTEN, RAD50, RAD51C, RAD51D, RB1, RECQL4, RET, RNF43, RPS20, RUNX1, SDHA, SDHAF2, SDHB, SDHC, SDHD, SMAD4, SMARCA4, SMARCB1, SMARCE1, STK11, SUFU, TERC, TERT, TMEM127, TP53,  TSC1, TSC2, VHL, WRN, WT1   Melanoma of skin ([place])  12/27/2017 Initial Diagnosis   Melanoma of skin ([place])     CHIEF COMPLIANT: Follow-up of left breast cancer on letrozole therapy  INTERVAL HISTORY: Theresa Huff is a  69 y.o. with above-mentioned history of left breast cancer treated with left mastectomy, adjuvant chemotherapy, radiation, and is currently on antiestrogen therapy with letrozole. She is a participant in the [place] clinical trial. She presents to the clinic today for annual follow-up.   She fell down in the grocery store and broke her right patella.  She has a brace today.  She was informed that it is healing well.  ALLERGIES:  is allergic to hydrocodone; tramadol; codeine; iodine; radiaplexrx [skin protectants, misc.]; sulfa antibiotics; tape; and primidone.  MEDICATIONS:  Current Outpatient Medications  Medication Sig Dispense Refill  . cetirizine (ZYRTEC) 10 MG tablet Take 10 mg by mouth daily.      . DULoxetine (CYMBALTA) 30 MG capsule   0  . fluticasone (FLONASE) 50 MCG/ACT nasal spray     . HYDROcodone-acetaminophen (NORCO/VICODIN) 5-325 MG tablet Take 1 tablet by mouth every 6 (six) hours as needed for moderate pain. 30 tablet 0  . ibuprofen (ADVIL) 400 MG tablet TAKE 1 TABLET BY MOUTH EVERY 6 HOURS WITH FOOD AS NEEDED FOR PAIN    . letrozole (FEMARA) 2.5 MG tablet Take 1 tablet (2.5 mg total) by mouth daily. 90 tablet 3  . LORazepam (ATIVAN) 2 MG tablet TAKE 1 TABLET BY MOUTH AT BEDTIME AS NEEDED FOR INSOMNIA  0  . omeprazole (PRILOSEC) 40 MG capsule Take by mouth daily.  0  . ondansetron (ZOFRAN ODT) 4 MG disintegrating tablet Take 1 tablet (4 mg total) by mouth every 8 (eight) hours as needed for nausea or vomiting. 20 tablet 0  . SUMAtriptan (IMITREX) 50 MG tablet TAKE 1 TABLET BY MOUTH EVERY 2 HOURS AS NEEDED FOR MIGRAINE MAY REPEAT IN 2 HRS IF HEADACHE PERSISTS 10 tablet 0  . tiZANidine (ZANAFLEX) 4 MG tablet Take 1 tablet (4 mg total) by mouth 3 (three) times daily as needed. 40 tablet 1  . triamcinolone cream (KENALOG) 0.1 % APPLY TO AFFECTED AREAS AS DIRECTED    . Vitamin D, Ergocalciferol, (DRISDOL) 50000 UNITS CAPS capsule Take 50,000 Units by mouth every 7 (seven) days.     No  current facility-administered medications for this visit.    PHYSICAL EXAMINATION: ECOG PERFORMANCE STATUS: 1 - Symptomatic but completely ambulatory  Vitals:   05/31/19 0810  BP: 129/77  Pulse: 73  Resp: 17  Temp: 97.8 F (36.6 C)  SpO2: 100%   Filed Weights   05/31/19 0810  Weight: 134 lb 3.2 oz (60.9 kg)    BREAST left mastectomy scar without lumps or nodules.  Right breast no lumps or nodules in the breast or axilla. No palpable axillary supraclavicular or infraclavicular adenopathy no breast tenderness or nipple discharge. (exam performed in the presence of a chaperone)  LABORATORY DATA:  I have reviewed the data as listed CMP Latest Ref Rng & Units 09/13/2016 06/09/2016 05/10/2016  Glucose 70 - 140 mg/dl 115 99 88  BUN 7.0 - 26.0 mg/dL 15.4 20.7 15.7  Creatinine 0.6 - 1.1 mg/dL 0.9 0.9 0.8  Sodium 136 - 145 mEq/L 139 141 144  Potassium 3.5 - 5.1 mEq/L 5.0 4.4 4.7  Chloride 101 - 111 mmol/L - - -  CO2 22 - 29 mEq/L _0 Calcium 8.4 - 10.4 mg/dL 9.6  10.6(H) 9.9  Total Protein 6.4 - 8.3 g/dL 6.4 7.7 6.6  Total Bilirubin 0.20 - 1.20 mg/dL 0.57 1.33(H) 0.76  Alkaline Phos 40 - 150 U/L 77 85 71  AST 5 - 34 U/L _0 ALT 0 - 55 U/L _1 Lab Results  Component Value Date   WBC 3.9 09/13/2016   HGB 12.9 09/13/2016   HCT 40.9 09/13/2016   MCV 94.5 09/13/2016   PLT 210 09/13/2016   NEUTROABS 1.8 09/13/2016    ASSESSMENT & PLAN:  Breast cancer of upper-outer quadrant of left female breast Left breast invasive ductal carcinoma grade 1; 4.8 cm with low-grade DCIS; margins negative 1/4 lymph nodes positive, ER 100% PR 100% HER-2 negative ratio 1.18 Ki-67 26% and 19% T2 N1 stage IIB status post adjuvant chemotherapy with dose dense Adriamycin and Cytoxan 4 followed by Abraxane weekly 12 Completed Adjuvant radiation therapy 10/17/14 ------------------------------------------------------------------------------- Current Treatment: [name] study: Randomized to  Ibrance + Anastrozole Started 10/27/2014;Discontinued Palbociclib for toxicities 06/11/2016(primarily neutropenia); Switched to Letrozole 09/13/2016 (shoulder pain)  Letrozole toxicities: Very occasional hot flashes. Does not have the myalgias related to prior anastrozole  Surveillance: 1. Breast Exam: 05/31/19: Benign 2. Mammograms 03/05/2019: [facility]: No evidence of malignancy  Patellar fracture: Currently with the brace. She will need bone densities every 2 years.  We are checking to see when her last bone density was.  Return to clinic in1 yearfor follow-up    No orders of the defined types were placed in this encounter.  The patient has a good understanding of the overall plan. she agrees with it. she will call with any problems that may develop before the next visit here.  Total time spent: 20 mins including face to face time and time spent for planning, charting and coordination of care  Nicholas Lose, MD 05/31/2019  I, Cloyde Reams Dorshimer, am acting as scribe for Dr. Nicholas Lose.  I have reviewed the above documentation for accuracy and completeness, and I agree with the above.

## 2019-05-31 ENCOUNTER — Inpatient Hospital Stay: Payer: Medicare Other | Attending: Hematology and Oncology | Admitting: Hematology and Oncology

## 2019-05-31 ENCOUNTER — Encounter: Payer: Self-pay | Admitting: Medical Oncology

## 2019-05-31 ENCOUNTER — Other Ambulatory Visit: Payer: Self-pay

## 2019-05-31 DIAGNOSIS — Z79811 Long term (current) use of aromatase inhibitors: Secondary | ICD-10-CM

## 2019-05-31 DIAGNOSIS — Z85820 Personal history of malignant melanoma of skin: Secondary | ICD-10-CM

## 2019-05-31 DIAGNOSIS — Z9221 Personal history of antineoplastic chemotherapy: Secondary | ICD-10-CM | POA: Diagnosis not present

## 2019-05-31 DIAGNOSIS — Z8781 Personal history of (healed) traumatic fracture: Secondary | ICD-10-CM | POA: Insufficient documentation

## 2019-05-31 DIAGNOSIS — C50412 Malignant neoplasm of upper-outer quadrant of left female breast: Secondary | ICD-10-CM

## 2019-05-31 DIAGNOSIS — Z923 Personal history of irradiation: Secondary | ICD-10-CM | POA: Diagnosis not present

## 2019-05-31 DIAGNOSIS — Z006 Encounter for examination for normal comparison and control in clinical research program: Secondary | ICD-10-CM

## 2019-05-31 DIAGNOSIS — Z9012 Acquired absence of left breast and nipple: Secondary | ICD-10-CM

## 2019-05-31 DIAGNOSIS — Z87891 Personal history of nicotine dependence: Secondary | ICD-10-CM

## 2019-05-31 DIAGNOSIS — Z170 Estrogen receptor positive status [ER+]: Secondary | ICD-10-CM

## 2019-05-31 MED ORDER — OMEPRAZOLE 40 MG PO CPDR: 40.0000 mg | DELAYED_RELEASE_CAPSULE | ORAL | 0 refills | Status: DC | PRN

## 2019-05-31 MED ORDER — LETROZOLE 2.5 MG PO TABS: 2.5000 mg | ORAL_TABLET | Freq: Every day | ORAL | 3 refills | Status: DC

## 2019-05-31 NOTE — Assessment & Plan Note (Signed)
Left breast invasive ductal carcinoma grade 1; 4.8 cm with low-grade DCIS; margins negative 1/4 lymph nodes positive, ER 100% PR 100% HER-2 negative ratio 1.18 Ki-67 26% and 19% T2 N1 stage IIB status post adjuvant chemotherapy with dose dense Adriamycin and Cytoxan 4 followed by Abraxane weekly 12 Completed Adjuvant radiation therapy 10/17/14 ------------------------------------------------------------------------------- Current Treatment: [name] study: Randomized to Ibrance + Anastrozole Started 10/27/2014;Discontinued Palbociclib for toxicities 06/11/2016(primarily neutropenia); Switched to Letrozole 09/13/2016 (shoulder pain)  Letrozole toxicities: Very occasional hot flashes. Does not have the myalgias related to prior anastrozole  Surveillance: 1. Breast Exam: 05/31/19: Benign 2. Mammograms Jan 2021: [facility]: No evidence of malignancy  Return to clinic in1 yearfor follow-up

## 2019-05-31 NOTE — Progress Notes (Signed)
[name]: 15-monthfollow up: In clinic visit, plus re-consent I met with patient this morning, who is here alone in clinic, for her 36 month follow-up and MD visit. Patient presented today with a right knee splint due to breaking her patella approximately a month ago.   Re-Conset: Patient was emailed study consent form AFT ICF Version 8, dated 03/14/2019, with the highlighted changes, last week, for her review. This morning, patient confirms to have read the consent form and verbally confirms understanding to the updates/changes which also includes updates/changes to the study questionnaires as well as, updates to the End of Treatment Phase and Follow-up Phase and the information that will be collected regarding COVID-19. Patient states that she has no questions to these updates/changes and proceeded to initial and date each page, as well as sign and date the consent form where indicated. Patient also provided consent for her residual specimens to be kept for future use in research, as well as someone may contact her in the future to ask to take part in more research. Patient was provided with a copy of this signed consent form for her records.  PRO's: Completed and checked for completion.  MD/H&P: Dr. GLindi Adiecompleted patient's assessment and addressed all of patient's questions and concerns. Breast exam completed today (see MD notes).  Visit:  Patient confirms to be doing well. Patient denies having any issues or concerns. Patient confirms to be taking letrozole daily. Patient denies SAE's or new anti-cancer medications. Patient confirms a positive COVID-19 test 10/04/2018, another test done in 10/26/2018 resulted as "not detected." Patient confirms receipt of 2 COVID vaccines, date unknown.   I informed patient that the next follow-up would be a phone call from me in six month's time and that she will be scheduled in one year's time for MD clinic visit, approximately in May of 2022. Patient denies having  any questions at this time. Patient was thanked for her time and continued support of study and encouraged to call with questions in the meantime.  LAdele Dan RN, BSN Clinical Research 05/31/2019 10:05 AM

## 2019-06-06 ENCOUNTER — Ambulatory Visit (INDEPENDENT_AMBULATORY_CARE_PROVIDER_SITE_OTHER): Payer: Medicare Other

## 2019-06-06 ENCOUNTER — Ambulatory Visit (INDEPENDENT_AMBULATORY_CARE_PROVIDER_SITE_OTHER): Payer: Medicare Other | Admitting: Orthopaedic Surgery

## 2019-06-06 ENCOUNTER — Other Ambulatory Visit: Payer: Self-pay

## 2019-06-06 ENCOUNTER — Encounter: Payer: Self-pay | Admitting: Orthopaedic Surgery

## 2019-06-06 DIAGNOSIS — S82044D Nondisplaced comminuted fracture of right patella, subsequent encounter for closed fracture with routine healing: Secondary | ICD-10-CM

## 2019-06-06 NOTE — Progress Notes (Addendum)
The patient is now between 5 and 6 weeks status post a nondisplaced patella fracture of her right knee.  She has been compliant using a knee immobilizer and keeping her knee from bending.  She has no complaints. She has been ambulating on that right lower extremity with full weight-bearing as tolerated in her brace.  On examination of her right knee there is no effusion.  Her extensor mechanism is intact.  She can hold her knee fully extended.  I had her flex her knee to about 50 to 60 degrees and then she can straighten it easily  A lateral view of her right knee shows the patella fracture is healing nicely.  Compared to previous films it looks almost completely healed with no significant malalignment or displacement.  At this point we will try just a hinged knee brace to offer her some stability and support and this will keep her from flexing past 90 degrees.  She will come in and out of brace as comfort allows.  She has not done any therapy because she is already flexible.  We will see her back for final visit in 4 weeks with a final lateral view of the right knee.  All questions and concerns were answered and addressed.

## 2019-06-10 ENCOUNTER — Other Ambulatory Visit: Payer: Self-pay | Admitting: Hematology and Oncology

## 2019-07-04 ENCOUNTER — Ambulatory Visit (INDEPENDENT_AMBULATORY_CARE_PROVIDER_SITE_OTHER): Payer: Medicare Other | Admitting: Physician Assistant

## 2019-07-04 ENCOUNTER — Encounter: Payer: Self-pay | Admitting: Orthopaedic Surgery

## 2019-07-04 ENCOUNTER — Other Ambulatory Visit: Payer: Self-pay

## 2019-07-04 ENCOUNTER — Ambulatory Visit (INDEPENDENT_AMBULATORY_CARE_PROVIDER_SITE_OTHER): Payer: Medicare Other

## 2019-07-04 VITALS — Ht 65.0 in | Wt 134.0 lb

## 2019-07-04 DIAGNOSIS — S82044D Nondisplaced comminuted fracture of right patella, subsequent encounter for closed fracture with routine healing: Secondary | ICD-10-CM | POA: Diagnosis not present

## 2019-07-04 NOTE — Progress Notes (Signed)
HPI: Mrs. Mayberry returns today 10 weeks status post nondisplaced patella fracture right knee.  She is overall doing well.  She does have some discomfort in the knee but the knee is bent for long period of time with going up and down stairs.  She has discontinued the knee brace.  Otherwise overall doing well.  Physical exam: Right knee full extension full flexion.  Passive range of motion reveals crepitus .  There is no abnormal warmth erythema or effusion in the knee.  Quad atrophy particularly VMO seen right knee.   Radiographs: Lateral view right knee shows the patella fracture to be completelyhealed.  There is no change in overall position alignment.  No other fractures identified.  Impression: Right knee nondisplaced patella fracture  Plan: She will work on quad strengthening particularly VMO exercises shown had patient demonstrate these back to me.  Follow-up with [place] on as-needed basis pain becomes worse or she has any mechanical symptoms of the knee.  Questions were encouraged and answered

## 2019-07-19 ENCOUNTER — Ambulatory Visit (INDEPENDENT_AMBULATORY_CARE_PROVIDER_SITE_OTHER): Payer: Medicare Other

## 2019-07-19 ENCOUNTER — Other Ambulatory Visit: Payer: Self-pay

## 2019-07-19 ENCOUNTER — Ambulatory Visit (INDEPENDENT_AMBULATORY_CARE_PROVIDER_SITE_OTHER): Payer: Medicare Other | Admitting: Podiatry

## 2019-07-19 ENCOUNTER — Encounter: Payer: Self-pay | Admitting: Podiatry

## 2019-07-19 ENCOUNTER — Other Ambulatory Visit: Payer: Self-pay | Admitting: Podiatry

## 2019-07-19 DIAGNOSIS — M25572 Pain in left ankle and joints of left foot: Secondary | ICD-10-CM

## 2019-07-19 DIAGNOSIS — S92102A Unspecified fracture of left talus, initial encounter for closed fracture: Secondary | ICD-10-CM | POA: Diagnosis not present

## 2019-07-19 DIAGNOSIS — M958 Other specified acquired deformities of musculoskeletal system: Secondary | ICD-10-CM

## 2019-07-19 MED ORDER — MELOXICAM 15 MG PO TABS
15.0000 mg | ORAL_TABLET | Freq: Every day | ORAL | 3 refills | Status: DC
Start: 2019-07-19 — End: 2019-11-15

## 2019-07-19 MED ORDER — METHYLPREDNISOLONE 4 MG PO TBPK
ORAL_TABLET | ORAL | 0 refills | Status: DC
Start: 2019-07-19 — End: 2019-07-27

## 2019-07-20 ENCOUNTER — Telehealth: Payer: Self-pay | Admitting: *Deleted

## 2019-07-20 DIAGNOSIS — M7752 Other enthesopathy of left foot: Secondary | ICD-10-CM

## 2019-07-20 DIAGNOSIS — S92102A Unspecified fracture of left talus, initial encounter for closed fracture: Secondary | ICD-10-CM

## 2019-07-20 DIAGNOSIS — M722 Plantar fascial fibromatosis: Secondary | ICD-10-CM

## 2019-07-20 DIAGNOSIS — M25572 Pain in left ankle and joints of left foot: Secondary | ICD-10-CM

## 2019-07-20 DIAGNOSIS — M958 Other specified acquired deformities of musculoskeletal system: Secondary | ICD-10-CM

## 2019-07-20 NOTE — Telephone Encounter (Addendum)
Prepared Orders to be faxed to [place] with clincals once 07/20/2019 clinicals are available and demographics for pre-cert and scheduling, orders to A. Prevette, CMA for pre-cert of [employer].

## 2019-07-20 NOTE — Telephone Encounter (Signed)
-----   Message from Rip Harbour, [facility] sent at 07/19/2019  9:43 AM EDT ----- Regarding: MRI MRI left ankle - Evaluate osteochondral defect, talus fracture secondary to trauma in March/April 2021 - surgical consideration

## 2019-07-22 NOTE — Telephone Encounter (Signed)
Done

## 2019-07-22 NOTE — Progress Notes (Signed)
Belladonna presents today stating that I am having pain across the top of my ankle since have been favoring the right knee due to an injury that I had back in March or April breaking my right kneecap.  She states that this had such an increase in pain since that time she has been using Voltaren gel she states that she does not know what else to do she is tried everything she knows of including immobilization and ice therapy.  ROS: Denies fever chills nausea vomiting muscle aches pains calf pain back pain chest pain shortness of breath.  Objective: Vital signs are stable alert oriented x3.  Pulses are palpable.  There is no erythema edema cellulitis drainage or odor to the left ankle.  She does have pain on palpation of the lateral gutter of the ankle it appears that the anterior talofibular ligament is intact she also appears to have a area of pain around the neck of the talus.  Radiographs taken today demonstrate what appears to be a healing fracture of the talar neck though it also demonstrates what appears to be osteochondral defect of the anterior medial aspect of the talar dome.  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  Chronic pain.  Plan: We are requesting an MRI of the ankle to evaluate for OCD and any trauma to ligamentous structures.  We are also going to start her on methylprednisolone followed by meloxicam.

## 2019-07-23 NOTE — Telephone Encounter (Signed)
Faxed orders, clinical to [place] for pre-cert and scheduling, informed A. Prevette, CMA of insurance pre-cert for [employer].

## 2019-07-24 NOTE — Telephone Encounter (Signed)
Health Plan: [employer] [facility]   Diagnostic Imaging   Scheduled Date of Service: 07/24/2019  Member Information LYSHA, SCHRADE Member #: [id number] [place], [place], [id number] Date Of Birth: 02-12-1950 Phone: [phone]    Based on the date and type of service entered, prior authorization by [place] is not required. Please contact the health plan if additional information is needed.

## 2019-07-24 NOTE — Telephone Encounter (Signed)
Faxed to [place] with authorization for [employer].

## 2019-07-27 ENCOUNTER — Encounter: Payer: Self-pay | Admitting: Podiatry

## 2019-07-27 ENCOUNTER — Ambulatory Visit (INDEPENDENT_AMBULATORY_CARE_PROVIDER_SITE_OTHER): Payer: Medicare Other | Admitting: Podiatry

## 2019-07-27 ENCOUNTER — Ambulatory Visit (INDEPENDENT_AMBULATORY_CARE_PROVIDER_SITE_OTHER): Payer: Medicare Other

## 2019-07-27 ENCOUNTER — Other Ambulatory Visit: Payer: Self-pay

## 2019-07-27 DIAGNOSIS — S90122A Contusion of left lesser toe(s) without damage to nail, initial encounter: Secondary | ICD-10-CM | POA: Diagnosis not present

## 2019-07-27 DIAGNOSIS — S92502A Displaced unspecified fracture of left lesser toe(s), initial encounter for closed fracture: Secondary | ICD-10-CM

## 2019-07-27 NOTE — Progress Notes (Signed)
She presents today states that she stubbed her toe on the ottoman last Wednesday night.  She states that the toe is painful and swollen.  Objective: Vitals are stable she alert oriented x3 edematous ecchymotic fifth digit left foot peers to be in good position.  Radiographs taken today do demonstrate a fracture of the head of the proximal phalanx minimal displacement.  Assessment: Fracture fifth toe left foot.  Plan: Demonstrated to her how to wrap the toe on a regular basis I will follow-up with her once her MRI is complete on her right foot.

## 2019-08-02 ENCOUNTER — Ambulatory Visit
Admission: RE | Admit: 2019-08-02 | Discharge: 2019-08-02 | Disposition: A | Discharge status: Home / Self Care | Payer: Medicare Other | Source: Ambulatory Visit | Attending: Podiatry | Admitting: Podiatry

## 2019-08-02 ENCOUNTER — Other Ambulatory Visit: Payer: Self-pay

## 2019-08-04 ENCOUNTER — Other Ambulatory Visit: Payer: Medicare Other

## 2019-08-07 ENCOUNTER — Ambulatory Visit (INDEPENDENT_AMBULATORY_CARE_PROVIDER_SITE_OTHER): Payer: Medicare Other | Admitting: Dermatology

## 2019-08-07 ENCOUNTER — Other Ambulatory Visit: Payer: Self-pay

## 2019-08-07 ENCOUNTER — Encounter: Payer: Self-pay | Admitting: Dermatology

## 2019-08-07 DIAGNOSIS — Z86018 Personal history of other benign neoplasm: Secondary | ICD-10-CM | POA: Diagnosis not present

## 2019-08-07 DIAGNOSIS — Z85820 Personal history of malignant melanoma of skin: Secondary | ICD-10-CM

## 2019-08-07 DIAGNOSIS — L820 Inflamed seborrheic keratosis: Secondary | ICD-10-CM | POA: Diagnosis not present

## 2019-08-07 DIAGNOSIS — D229 Melanocytic nevi, unspecified: Secondary | ICD-10-CM

## 2019-08-07 DIAGNOSIS — D225 Melanocytic nevi of trunk: Secondary | ICD-10-CM | POA: Diagnosis not present

## 2019-08-07 DIAGNOSIS — Z1283 Encounter for screening for malignant neoplasm of skin: Secondary | ICD-10-CM

## 2019-08-10 ENCOUNTER — Telehealth: Payer: Self-pay | Admitting: *Deleted

## 2019-08-10 NOTE — Telephone Encounter (Signed)
-----   Message from Garrel Ridgel, [place] sent at 08/08/2019  7:30 AM EDT ----- Please send for an over read and inform the patient of the delay please

## 2019-08-10 NOTE — Telephone Encounter (Signed)
Left message informing pt of Dr. Stephenie Acres request to send a copy of the MRI disc to a radiology specialist for more details to plan treatment, there would be a 10-14 day delay in the final results and once received we would call with instructions. Faxed request for MRI disc to [place].

## 2019-08-14 NOTE — Telephone Encounter (Signed)
Received copy of MRI disc from [facility] 08/13/2019 afternoon, mailed to [facility] today.

## 2019-08-24 ENCOUNTER — Encounter: Payer: Self-pay | Admitting: Podiatry

## 2019-08-30 ENCOUNTER — Encounter: Payer: Self-pay | Admitting: Podiatry

## 2019-08-30 ENCOUNTER — Encounter: Payer: Self-pay | Admitting: Dermatology

## 2019-08-30 NOTE — Progress Notes (Signed)
   Follow-Up Visit   Subjective  Theresa Huff is a 69 y.o. female who presents for the following: Annual Exam (Patient here today for skin check she does have a few spots on her nose, upper lip and back x year. Patient states the only spot that's bleeding is the one on her back.).  Moles Location:  Duration:  Quality:  Associated Signs/Symptoms: Modifying Factors:  Severity:  Timing: Context: History of melanoma  Objective  Well appearing patient in no apparent distress; mood and affect are within normal limits.  A full examination was performed including scalp, head, eyes, ears, nose, lips, neck, chest, axillae, abdomen, back, buttocks, bilateral upper extremities, bilateral lower extremities, hands, feet, fingers, toes, fingernails, and toenails. All findings within normal limits unless otherwise noted below.   Assessment & Plan    Hx of dysplastic nevus Right Lower Back  Annual skin examination  Nevus Mid Back  Twice annual self examination, annual dermatologist examination.  Inflamed seborrheic keratosis Right Lower Back  Return if bleeding recurs or lesion grows Skin cancer screening performed today.     I, Lavonna Monarch, MD, have reviewed all documentation for this visit.  The documentation on 08/30/19 for the exam, diagnosis, procedures, and orders are all accurate and complete.

## 2019-09-04 ENCOUNTER — Telehealth: Payer: Self-pay | Admitting: *Deleted

## 2019-09-04 ENCOUNTER — Ambulatory Visit: Payer: Medicare Other | Admitting: Podiatry

## 2019-09-04 NOTE — Telephone Encounter (Signed)
I informed pt of Dr. Stephenie Acres review and recommendation and pt states she will just schedule with Dr. Sherryle Lis.

## 2019-09-04 NOTE — Telephone Encounter (Signed)
Dr. Milinda Pointer states after reviewing the over read he would like pt to have surgery with Dr. Sherryle Lis, and would like pt to schedule with Dr. Sherryle Lis unless pt would like to have him go over the over read report and then she come in to discuss with Dr. Sherryle Lis at a later date.

## 2019-09-11 ENCOUNTER — Encounter: Payer: Self-pay | Admitting: Podiatry

## 2019-09-11 ENCOUNTER — Ambulatory Visit (INDEPENDENT_AMBULATORY_CARE_PROVIDER_SITE_OTHER): Payer: Medicare Other | Admitting: Podiatry

## 2019-09-11 ENCOUNTER — Other Ambulatory Visit: Payer: Self-pay

## 2019-09-11 VITALS — Temp 96.8°F

## 2019-09-11 DIAGNOSIS — M25472 Effusion, left ankle: Secondary | ICD-10-CM

## 2019-09-11 DIAGNOSIS — M19172 Post-traumatic osteoarthritis, left ankle and foot: Secondary | ICD-10-CM

## 2019-09-11 DIAGNOSIS — S93492S Sprain of other ligament of left ankle, sequela: Secondary | ICD-10-CM | POA: Diagnosis not present

## 2019-09-11 DIAGNOSIS — M25372 Other instability, left ankle: Secondary | ICD-10-CM

## 2019-09-11 NOTE — Addendum Note (Signed)
Addended by: Celene Skeen A on: 09/11/2019 01:21 PM   Modules accepted: Orders

## 2019-09-11 NOTE — Patient Instructions (Signed)
Ankle Sprain   An ankle sprain is a stretch or tear in a ligament in the ankle. Ligaments are tissues that connect bones to each other. The two most common types of ankle sprains are: 1. Inversion sprain. This happens when the foot turns inward and the ankle rolls outward. It affects the ligament on the outside of the foot (lateral ligament). 2. Eversion sprain. This happens when the foot turns outward and the ankle rolls inward. It affects the ligament on the inner side of the foot (medial ligament). What are the causes? This condition is often caused by accidentally rolling or twisting the ankle. What increases the risk? You are more likely to develop this condition if you play sports. What are the signs or symptoms?  Symptoms of this condition include: 1. Pain in your ankle. 2. Swelling. 3. Bruising. This may develop right after you sprain your ankle or 1-2 days later. 4. Trouble standing or walking, especially when you turn or change directions. How is this diagnosed? This condition is diagnosed with: 1. A physical exam. During the exam, your health care provider will press on certain parts of your foot and ankle and try to move them in certain ways. 2. X-ray imaging. These may be taken to see how severe the sprain is and to check for broken bones. How is this treated? This condition may be treated with: 1. A brace or splint. This is used to keep the ankle from moving until it heals. 2. An elastic bandage. This is used to support the ankle. 3. Crutches. 4. Pain medicine. 5. Surgery. This may be needed if the sprain is severe. 6. Physical therapy. This may help to improve the range of motion in the ankle. Follow these instructions at home: If you have a brace or a splint: 1. Wear the brace or splint as told by your health care provider. Remove it only as told by your health care provider. 2. Loosen the brace or splint if your toes tingle, become numb, or turn cold and  blue. 3. Keep the brace or splint clean. 4. If the brace or splint is not waterproof: 1. Do not let it get wet. 2. Cover it with a watertight covering when you take a bath or a shower. If you have an elastic bandage (dressing):  Remove it to shower or bathe.  Try not to move your ankle much, but wiggle your toes from time to time. This helps to prevent swelling.  Adjust the dressing to make it more comfortable if it feels too tight.  Loosen the dressing if you have numbness or tingling in your foot, or if your foot becomes cold and blue. Managing pain, stiffness, and swelling   1. Take over-the-counter and prescription medicines only as told by your health care provider. 2. For 2-3 days, keep your ankle raised (elevated) above the level of your heart as much as possible. 3. If directed, put ice on the injured area: ? If you have a removable brace or splint, remove it as told by your health care provider. ? Put ice in a plastic bag. ? Place a towel between your skin and the bag. ? Leave the ice on for 20 minutes, 2-3 times a day. General instructions  Rest your ankle.  Do not use the injured limb to support your body weight until your health care provider says that you can. Use crutches as told by your health care provider.  Do not use any products that contain nicotine or  tobacco, such as cigarettes, e-cigarettes, and chewing tobacco. If you need help quitting, ask your health care provider.  Keep all follow-up visits as told by your health care provider. This is important. Contact a health care provider if:  You have rapidly increasing bruising or swelling.  Your pain is not relieved with medicine. Get help right away if:  Your foot or toes become numb or blue.  You have severe pain that gets worse. Summary  An ankle sprain is a stretch or tear in a ligament in the ankle. Ligaments are tissues that connect bones to each other.  This condition is often caused by  accidentally rolling or twisting the ankle.  Symptoms include pain, swelling, bruising, and trouble walking.  To relieve pain and swelling, put ice on the affected ankle, raise your ankle above the level of your heart, and use an elastic bandage.  Keep all follow-up visits as told by your health care provider. This is important. This information is not intended to replace advice given to you by your health care provider. Make sure you discuss any questions you have with your health care provider. Document Revised: 10/03/2017 Document Reviewed: 06/07/2017 Elsevier Patient Education  [place].  Ankle Sprain, Phase I Rehab An ankle sprain is an injury to the ligaments of your ankle. Ankle sprains cause stiffness, loss of motion, and loss of strength. Ask your health care provider which exercises are safe for you. Do exercises exactly as told by your health care provider and adjust them as directed. It is normal to feel mild stretching, pulling, tightness, or discomfort as you do these exercises. Stop right away if you feel sudden pain or your pain gets worse. Do not begin these exercises until told by your health care provider. Stretching and range-of-motion exercises These exercises warm up your muscles and joints and improve the movement and flexibility of your lower leg and ankle. These exercises also help to relieve pain and stiffness. Gastroc and soleus stretch  This exercise is also called a calf stretch. It stretches the muscles in the back of the lower leg. These muscles are the gastrocnemius, or gastroc, and the soleus. 3. Sit on the floor with your left / right leg extended. 4. Loop a belt or towel around the ball of your left / right foot. The ball of your foot is on the walking surface, right under your toes. 5. Keep your left / right ankle and foot relaxed and keep your knee straight while you use the belt or towel to pull your foot toward you. You should feel a gentle stretch  behind your calf or knee in your gastroc muscle. 6. Hold this position for 15 seconds, then release to the starting position. 7. Repeat the exercise with your knee bent. You can put a pillow or a rolled bath towel under your knee to support it. You should feel a stretch deep in your calf in the soleus muscle or at your Achilles tendon. Repeat 5 times. Complete this exercise 2 times a day. Ankle alphabet   5. Sit with your left / right leg supported at the lower leg. ? Do not rest your foot on anything. ? Make sure your foot has room to move freely. 6. Think of your left / right foot as a paintbrush. ? Move your foot to trace each letter of the alphabet in the air. Keep your hip and knee still while you trace. ? Make the letters as large as you can without  feeling discomfort. 7. Trace every letter from A to Z. Repeat 5 times. Complete this exercise 2 times a day. Strengthening exercises These exercises build strength and endurance in your ankle and lower leg. Endurance is the ability to use your muscles for a long time, even after they get tired. Ankle dorsiflexion   3. Secure a rubber exercise band or tube to an object, such as a table leg, that will stay still when the band is pulled. Secure the other end around your left / right foot. 4. Sit on the floor facing the object, with your left / right leg extended. The band or tube should be slightly tense when your foot is relaxed. 5. Slowly bring your foot toward you, bringing the top of your foot toward your shin (dorsiflexion), and pulling the band tighter. 6. Hold this position for 15 seconds. 7. Slowly return your foot to the starting position. Repeat 5 times. Complete this exercise 2 times a day. Ankle plantar flexion   7. Sit on the floor with your left / right leg extended. 8. Loop a rubber exercise tube or band around the ball of your left / right foot. The ball of your foot is on the walking surface, right under your  toes. ? Hold the ends of the band or tube in your hands. ? The band or tube should be slightly tense when your foot is relaxed. 9. Slowly point your foot and toes downward to tilt the top of your foot away from your shin (plantar flexion). 10. Hold this position for 15 seconds. 11. Slowly return your foot to the starting position. Repeat 5 times. Complete this exercise 2times a day. Ankle eversion 5. Sit on the floor with your legs straight out in front of you. 6. Loop a rubber exercise band or tube around the ball of your left / right foot. The ball of your foot is on the walking surface, right under your toes. 1. Hold the ends of the band in your hands, or secure the band to a stable object. 2. The band or tube should be slightly tense when your foot is relaxed. 7. Slowly push your foot outward, away from your other leg (eversion). 8. Hold this position for 15 seconds. 9. Slowly return your foot to the starting position. Repeat 10 times. Complete this exercise 2 times a day. This information is not intended to replace advice given to you by your health care provider. Make sure you discuss any questions you have with your health care provider. Document Revised: 05/02/2018 Document Reviewed: 10/24/2017 Elsevier Patient Education  2020 [employer].

## 2019-09-11 NOTE — Progress Notes (Signed)
  Subjective:  [name], female    DOB: [birth year],  MRN: [id number]  Chief Complaint  Patient presents with  . Ankle Pain    Follow-up; Left Ankle-both sides; pt stted, "My ankle still hurts; Pain 7/10; hurts to be on foot for a long time"  . MRI Results    Results attached; need to be reviewed    69 y.o. female presents with the above complaint. History confirmed with patient.  She is referred to me by Dr. Milinda Pointer for a left ankle injury suffered in March 2021.  She fell and suffered a right patella fracture, and later had noticed that her left ankle had been quite painful for some time.  He sent her for an MRI and second opinion read and she is here today for review of both these.  Objective:  Physical Exam: warm, good capillary refill, no trophic changes or ulcerative lesions, normal DP and PT pulses and normal sensory exam.  Left Foot and ankle: Mild pain over the anterior joint line, the ganglion noted on the MRI is palpable lateral to the extensor digitorum longus tendon and anterior ankle joint, pain over palpation to the ATFL, she has a positive anterior drawer test, negative talar tilt and no pain over the calcaneofibular ligament, no pain over the peroneal tendons.  Mild pain with plantar flexion and eversion but strength is 5 out of 5.   MRI left ankle reviewed dated 08/04/2019: Chronically torn attenuated ATFL, anterior and posterior ankle joint effusions, intratenderness ganglion of the anterior ankle joint, reactive marrow edema of the distal fibula, split tear of the juxta malleolar peroneus brevis and peroneal tenosynovitis.  Mild tibiotalar and subtalar joint arthropathy Assessment:   1. Left ankle instability   2. Sprain of anterior talofibular ligament of left ankle, sequela   3. Effusion of left ankle   4. Post-traumatic osteoarthritis of left ankle      Plan:  Patient was evaluated and treated and all questions answered.   -Reviewed with her that  she suffered a severe ankle sprain, and has a severely attenuated if not completely torn ATFL with a positive anterior drawer and anterior ankle instability.  She does not have significant pain over the peroneal tendons where the tear is located on MRI.  Mild tenderness over the fibula where the reactive marrow edema is.  -So far she has had minimal nonsurgical treatment including meloxicam which has been quite helpful for her.  She has been ambulating as tolerated in normal shoe gear.  She does feel unstable and especially notes pain while ascending and descending ramps or stairs.  I recommend that we support her with a Tri-Lock ankle brace and begin a functional rehab and strengthening and stability exercise program with physical therapy to see if we can maximize her return to function.  I cautioned her that there may be limited gains with this given that we are now 6 months nearly from her injury.  I also discussed with her surgical treatment including arthroscopy of the ankle joint to inspect for arthropathy and/or osteochondral defect which may have been missed by the MRI, lateral ankle stabilization of the ATFL, and the possibility of peroneal tendon repair but currently this is not symptomatic for her.  She will follow up with me in 6 weeks following rehab for reevaluation.  Theresa Huff, DPM 09/11/2019     Return in about 6 weeks (around 10/23/2019) for Recheck ankle left.

## 2019-10-25 ENCOUNTER — Other Ambulatory Visit: Payer: Self-pay

## 2019-10-25 ENCOUNTER — Ambulatory Visit (INDEPENDENT_AMBULATORY_CARE_PROVIDER_SITE_OTHER): Payer: Medicare Other | Admitting: Podiatry

## 2019-10-25 DIAGNOSIS — M25372 Other instability, left ankle: Secondary | ICD-10-CM | POA: Diagnosis not present

## 2019-10-25 DIAGNOSIS — S93492S Sprain of other ligament of left ankle, sequela: Secondary | ICD-10-CM

## 2019-10-25 DIAGNOSIS — M25472 Effusion, left ankle: Secondary | ICD-10-CM

## 2019-10-25 NOTE — Progress Notes (Signed)
  Subjective:  [name], female    DOB: 1950-01-30,  MRN: [id number]  Chief Complaint  Patient presents with  . Foot Pain    6wk Ankle f/u LT     69 y.o. female returns with the above complaint. History confirmed with patient.  She is referred to me by Dr. Milinda Pointer for a left ankle injury suffered in March 2021.  She fell and suffered a right patella fracture, and later had noticed that her left ankle had been quite painful for some time.  She has had some improvement with physical therapy.  Think she is about 25% better.  Objective:  Physical Exam: warm, good capillary refill, no trophic changes or ulcerative lesions, normal DP and PT pulses and normal sensory exam.  Left Foot and ankle: Mild pain over the anterior joint line, the ganglion noted on the MRI is palpable lateral to the extensor digitorum longus tendon and anterior ankle joint, pain over palpation to the ATFL, she has a positive anterior drawer test, negative talar tilt and no pain over the calcaneofibular ligament, no pain over the peroneal tendons.  Mild pain with plantar flexion and eversion but strength is 5 out of 5.   MRI left ankle reviewed dated 08/04/2019: Chronically torn attenuated ATFL, anterior and posterior ankle joint effusions, intratenderness ganglion of the anterior ankle joint, reactive marrow edema of the distal fibula, split tear of the juxta malleolar peroneus brevis and peroneal tenosynovitis.  Mild tibiotalar and subtalar joint arthropathy Assessment:   No diagnosis found.   Plan:  Patient was evaluated and treated and all questions answered.   -Discussed with her that she has made some progress with physical therapy.  Discussed the timing and role of surgery.  I think be reasonable she tried therapy for 1 additional month to see if she is continuing to make progress.  If she is not improving then we will consider arthroscopic evaluation of the ankle joint as well as surgical  stabilization and repair of the lateral ankle ligament complex.  Lanae Crumbly, DPM 10/25/2019     No follow-ups on file.

## 2019-11-13 ENCOUNTER — Telehealth: Payer: Self-pay | Admitting: Medical Oncology

## 2019-11-13 NOTE — Telephone Encounter (Signed)
Theresa Huff: 42 month follow-up outgoing call  Call to patient for her study follow-up call at 42 months. Patient confirms to be doing well. Patient with no SAE's and confirms taking daily Letrozole as prescribed. I asked patient if she had any questions and patient denied.  Patient was thanked for her time and her continued support of study and was encouraged to call me or Dr. Lindi Adie with any questions or concerns she may have.  No new anti-cancer medications prescribed.  Patient schedule to return for an in-clinic visit in May of 2022 Maxwell Marion, RN, BSN, [facility] 11/13/2019 3:12 PM

## 2019-11-15 ENCOUNTER — Other Ambulatory Visit: Payer: Self-pay | Admitting: Podiatry

## 2019-11-19 ENCOUNTER — Ambulatory Visit (INDEPENDENT_AMBULATORY_CARE_PROVIDER_SITE_OTHER): Payer: Medicare Other | Admitting: Dermatology

## 2019-11-19 ENCOUNTER — Other Ambulatory Visit: Payer: Self-pay

## 2019-11-19 DIAGNOSIS — Z1283 Encounter for screening for malignant neoplasm of skin: Secondary | ICD-10-CM

## 2019-11-19 DIAGNOSIS — R202 Paresthesia of skin: Secondary | ICD-10-CM | POA: Diagnosis not present

## 2019-11-19 DIAGNOSIS — D2271 Melanocytic nevi of right lower limb, including hip: Secondary | ICD-10-CM

## 2019-11-19 DIAGNOSIS — D2272 Melanocytic nevi of left lower limb, including hip: Secondary | ICD-10-CM

## 2019-11-19 DIAGNOSIS — D229 Melanocytic nevi, unspecified: Secondary | ICD-10-CM

## 2019-11-19 DIAGNOSIS — Z85820 Personal history of malignant melanoma of skin: Secondary | ICD-10-CM | POA: Diagnosis not present

## 2019-11-19 DIAGNOSIS — L570 Actinic keratosis: Secondary | ICD-10-CM | POA: Diagnosis not present

## 2019-11-19 DIAGNOSIS — Z86006 Personal history of melanoma in-situ: Secondary | ICD-10-CM

## 2019-11-19 MED ORDER — CLOBETASOL PROPIONATE 0.05 % EX CREA: TOPICAL_CREAM | CUTANEOUS | 1 refills | Status: DC

## 2019-11-21 ENCOUNTER — Encounter: Payer: Self-pay | Admitting: Dermatology

## 2019-11-21 NOTE — Progress Notes (Signed)
   Follow-Up Visit   Subjective  Theresa Huff is a 69 y.o. female who presents for the following: Follow-up (On keritosis on back. Patient also wants her nose and lip looked at. ).  General skin examination plus itching on back. Location:  Duration:  Quality:  Associated Signs/Symptoms: Modifying Factors:  Severity:  Timing: Context:   Objective  Well appearing patient in no apparent distress; mood and affect are within normal limits.  A full examination was performed including scalp, head, eyes, ears, nose, lips, neck, chest, axillae, abdomen, back, buttocks, bilateral upper extremities, bilateral lower extremities, hands, feet, fingers, toes, fingernails, and toenails. All findings within normal limits unless otherwise noted below.   Assessment & Plan    AK (actinic keratosis) (2) Right Nasal Sidewall; Left Ala Nasi  Destruction of lesion - Left Ala Nasi, Right Nasal Sidewall Complexity: simple   Destruction method: cryotherapy   Informed consent: discussed and consent obtained   Timeout:  patient name, date of birth, surgical site, and procedure verified Lesion destroyed using liquid nitrogen: Yes   Cryotherapy cycles:  5 Outcome: patient tolerated procedure well with no complications   Post-procedure details: wound care instructions given    Notalgia paresthetica Right Upper Back  Use over-the-counter cream containing pramoxine cream and follow up in 3-6 weeks.  If pramoxine fail she may try the clobetasol.  Reordered Medications clobetasol cream (TEMOVATE) 0.05 %  Skin exam for malignant neoplasm Chest - Medial (Center)  No signs of atypical moles or non mole skin cancer on today's exam  Personal history of malignant melanoma of skin Right Upper Arm - Anterior  Self examine skin twice annually, complete a dermatologist skin examination annually plus as needed.  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   Atypical mole (5) Left Thigh - Anterior; Right Thigh - Anterior; Right Lower Leg - Anterior (3)  No treatment.      I, Lavonna Monarch, MD, have reviewed all documentation for this visit.  The documentation on 11/21/19 for the exam, diagnosis, procedures, and orders are all accurate and complete.

## 2019-11-22 ENCOUNTER — Other Ambulatory Visit: Payer: Self-pay

## 2019-11-22 ENCOUNTER — Ambulatory Visit (INDEPENDENT_AMBULATORY_CARE_PROVIDER_SITE_OTHER): Payer: Medicare Other | Admitting: Podiatry

## 2019-11-22 ENCOUNTER — Encounter: Payer: Self-pay | Admitting: Podiatry

## 2019-11-22 DIAGNOSIS — M25372 Other instability, left ankle: Secondary | ICD-10-CM | POA: Diagnosis not present

## 2019-11-22 DIAGNOSIS — S86312D Strain of muscle(s) and tendon(s) of peroneal muscle group at lower leg level, left leg, subsequent encounter: Secondary | ICD-10-CM

## 2019-11-22 DIAGNOSIS — S93492S Sprain of other ligament of left ankle, sequela: Secondary | ICD-10-CM

## 2019-11-22 NOTE — Progress Notes (Signed)
  Subjective:  [name], female    DOB: October 17, 1950,  MRN: [id number]  Chief Complaint  Patient presents with  . Follow-up    left ankle follow up. pt states that her ankle has improved a little but still painful     69 y.o. female returns with the above complaint. History confirmed with patient.  She still had some improvement with physical therapy but has been slow.  She has persistent pain in the lateral ankle.  Objective:  Physical Exam: warm, good capillary refill, no trophic changes or ulcerative lesions, normal DP and PT pulses and normal sensory exam.  Left Foot and ankle: Today with less pain over the anterior joint line although some still in the anterolateral gutter, she has pain with resisted eversion and inversion, there is pain along the ATFL and CFL as well as the peroneal tendons distal to the malleolus and localized edema here   MRI left ankle reviewed dated 08/04/2019: Chronically torn attenuated ATFL, anterior and posterior ankle joint effusions, intratenderness ganglion of the anterior ankle joint, reactive marrow edema of the distal fibula, split tear of the juxta malleolar peroneus brevis and peroneal tenosynovitis.  Mild tibiotalar and subtalar joint arthropathy Assessment:   1. Left ankle instability   2. Sprain of anterior talofibular ligament of left ankle, sequela   3. Tear of peroneal tendon, left, subsequent encounter      Plan:  Patient was evaluated and treated and all questions answered.   -Today we had lengthy discussion that her progress has slowed somewhat with physical therapy.  I think is possible that she could continue to improve with physical therapy nonsurgical treatment, but she is beginning to plateau.  She has exhausted nonsurgical therapy to this point.  I recommend we proceed with surgical intervention within the next month.  Surgical we discussed the following: Arthroscopy of the left ankle, surgical stabilization and  repair of the lateral ankle ligamentous complexes including the ATFL and CFL, and repair of the peroneal tendon tears.  We discussed the postoperative course and protocol and that she would be nonweightbearing in a splint for 2 weeks, nonweightbearing in a boot for 4 weeks and then weightbearing in a boot for an additional 4 weeks.  We discussed postoperative pain medications and the recovery process.  She is amenable to surgery.  We discussed the risks, benefits, and potential complications.  No guarantees were given, and we discussed that 100 percent pain-free may not be a reasonable goal.  Informed consent was signed and reviewed in the office.  Will be scheduled for December 07, 2019   Surgical plan:  Procedure: -Left ankle arthroscopy, lateral ligament stabilization with modified Brostrm Gould procedure, repair of peroneal tendon tears.  Location: -[place]  Anesthesia plan: -General anesthesia with regional block  Postoperative pain plan: - Tylenol 1000 mg every 6 hours, ibuprofen 600 mg every 6 hours, gabapentin 300 mg every 8 hours x5 days, oxycodone (she has intolerance with nausea to hydrocodone and codeine, but think she has tolerated oxycodone before) 5 mg 1-2 tabs every 6 hours only as needed, will prescribe Zofran as needed  DVT prophylaxis: -Aspirin 325 mg twice daily  WB Restrictions / DME needs: -NWB in a posterior splint immediate postoperative, will dispense CAM boot 1 sutures removed   Lanae Crumbly, DPM 11/22/2019     No follow-ups on file.

## 2019-11-27 NOTE — Progress Notes (Signed)
Cardiology Office Note:   Date:  11/28/2019  NAME:  Theresa Huff    MRN: [id number] DOB:  13-May-1950   PCP:  Antony Contras, MD  Cardiologist:  No primary care provider on file.   Referring MD: Antony Contras, MD   Chief Complaint  Patient presents with  . Abnormal ECG   History of Present Illness:   Theresa Huff is a 69 y.o. female with a hx of venous insufficiency who is being seen today for the evaluation of abnormal EKG at the request of Antony Contras, MD. Seen by cardiology last year for LE edema. Known history of venous insufficiency.   She was recently evaluated by her primary care physician for preoperative assessment.  EKG demonstrated an old anteroseptal infarct.  This was on her EKG from 2020.  He then referred her for further evaluation.  She had an echocardiogram in 2016 that was normal.  She never had a heart attack or stroke to her knowledge.  She has no chest pain or shortness of breath.  She is going to undergo ankle surgery.  She is not that active due to the pain in the leg.  With her current level of activity which includes walking around her house or doing activities around town she has not chest pain, shortness of breath or palpitations.  Her most recent lipid profile shows a total cholesterol 249, HDL 62, LDL 171, triglycerides 95.  She is nondiabetic with an A1c of 5.5.  The majority of her medical issues are arthritic in nature.  She also suffers from depression which is treated.  She denies any symptoms today.  She is a former smoker but smoked for only about 3 years.  She does not drink alcohol in excess.  No illicit drug use is reported.  Her mother had heart disease including heart attack.  Problem List 1. Venous insufficiency -Left leg   Past Medical History: Past Medical History:  Diagnosis Date  . Allergic rhinitis due to pollen   . Allergy   . Anemia    with pregnancy  . Atypical mole 05/08/1998   Right Lower Back  . Atypical mole 06/28/2006    Right Thigh (slight)  . Atypical mole 06/28/2006   Right Inner Shin (slight to moderate)  . Atypical mole 06/28/2006   Right Shin Sup (slight to moderate)  . Atypical mole 06/28/2006   Right Shin Inf (slight to moderate) ([place])  . Atypical mole 09/07/2006   Right Inner Left Lower (slight to moderate)  . Atypical mole 09/07/2006   Left Outer Eye (mild)  . Breast cancer ([place])   . Clark level II melanoma ([place]) 03/10/1998   Right Upper Arm (excision)  . Cough   . Depressive disorder, not elsewhere classified   . Family history of melanoma   . GERD (gastroesophageal reflux disease)   . Hot flashes   . Insomnia, unspecified   . Melanoma in situ ([place]) 12/15/2005   Right Outer Shin (excision)  . Melanoma in situ ([place]) 10/07/2010   Left Elbow  . Melanoma in situ ([place]) 09/22/2011   Left Upper Arm (excision)  . Melanoma in situ ([place]) 10/24/2017   Left Lower Shin [facility])  . Melanoma of skin ([place]) 12/27/2017  . Personal history of melanoma in-situ   . Plantar fasciitis   . Radiation 09/09/14-10/17/14   Left chestwall/supraclav.  Marland Kitchen Restless leg syndrome 09/09/2010  . SCCA (squamous cell carcinoma) of skin 05/27/2014   Left Lower Arm, inf (in situ) (curet  and 5FU)  . Skin cancer   . Wears glasses   . Weight gain     Past Surgical History: Past Surgical History:  Procedure Laterality Date  . COLONOSCOPY    . MASTECTOMY W/ SENTINEL NODE BIOPSY Left 03/07/2014   Procedure: LEFT TOTAL MASTECTOMY WITH LEFT SENTINEL LYMPH NODE BIOPSY;  Surgeon: Rolm Bookbinder, MD;  Location: [place];  Service: General;  Laterality: Left;  Marland Kitchen MELANOMA EXCISION     right leg, left arm, right arm (several from each area)  . PORT-A-CATH REMOVAL N/A 12/04/2014   Procedure: REMOVAL PORT-A-CATH;  Surgeon: Rolm Bookbinder, MD;  Location: [place];  Service: General;  Laterality: N/A;  . PORTACATH PLACEMENT Right 04/01/2014   Procedure: INSERTION PORT-A-CATH;  Surgeon:  Rolm Bookbinder, MD;  Location: [place];  Service: General;  Laterality: Right;  . TONSILLECTOMY      Current Medications: Current Meds  Medication Sig  . cetirizine (ZYRTEC) 10 MG tablet Take 10 mg by mouth daily.    . clobetasol cream (TEMOVATE) 0.05 % Apply to affected area after bathing daily  . DULoxetine (CYMBALTA) 30 MG capsule   . fluticasone (FLONASE) 50 MCG/ACT nasal spray   . ibuprofen (ADVIL) 400 MG tablet TAKE 1 TABLET BY MOUTH EVERY 6 HOURS WITH FOOD AS NEEDED FOR PAIN  . letrozole (FEMARA) 2.5 MG tablet Take 1 tablet (2.5 mg total) by mouth daily.  Marland Kitchen LORazepam (ATIVAN) 2 MG tablet TAKE 1 TABLET BY MOUTH AT BEDTIME AS NEEDED FOR INSOMNIA  . meloxicam (MOBIC) 15 MG tablet TAKE 1 TABLET BY MOUTH EVERY DAY  . naproxen (NAPROSYN) 500 MG tablet   . omeprazole (PRILOSEC) 40 MG capsule Take 1 capsule (40 mg total) by mouth as needed.  . SUMAtriptan (IMITREX) 50 MG tablet TAKE 1 TABLET BY MOUTH EVERY 2 HOURS AS NEEDED FOR MIGRAINE MAY REPEAT IN 2 HRS IF HEADACHE PERSISTS  . tiZANidine (ZANAFLEX) 4 MG tablet Take 1 tablet (4 mg total) by mouth 3 (three) times daily as needed.  . triamcinolone cream (KENALOG) 0.1 % APPLY TO AFFECTED AREAS AS DIRECTED  . Vitamin D, Ergocalciferol, (DRISDOL) 50000 UNITS CAPS capsule Take 50,000 Units by mouth every 7 (seven) days.     Allergies:    Hydrocodone; Tramadol; Codeine; Iodine; Radiaplexrx [skin protectants, misc.]; Sulfa antibiotics; Tape; and Primidone   Social History: Social History   Socioeconomic History  . Marital status: Married    Spouse name: Legrand Como  . Number of children: 2  . Years of education: Not on file  . Highest education level: Some college, no degree  Occupational History  . Occupation: retired    Fish farm manager: Marketing executive [employer]  Tobacco Use  . Smoking status: Former Smoker    Packs/day: 0.20    Years: 3.00    Pack years: 0.60    Types: Cigarettes    Quit date: 01/25/1974    Years since quitting:  45.8  . Smokeless tobacco: Never Used  Substance and Sexual Activity  . Alcohol use: Yes    Alcohol/week: 2.0 standard drinks    Types: 2 Glasses of wine per week    Comment: 2 glasses wine per week  . Drug use: No  . Sexual activity: Never  Other Topics Concern  . Not on file  Social History Narrative   Patient is left-handed. She lives with her husband in a 2 story house. She drinks 1 glass of tea a day. She does not exercise.  Social Determinants of Health   Financial Resource Strain:   . Difficulty of Paying Living Expenses: Not on file  Food Insecurity:   . Worried About Charity fundraiser in the Last Year: Not on file  . Ran Out of Food in the Last Year: Not on file  Transportation Needs:   . Lack of Transportation (Medical): Not on file  . Lack of Transportation (Non-Medical): Not on file  Physical Activity:   . Days of Exercise per Week: Not on file  . Minutes of Exercise per Session: Not on file  Stress:   . Feeling of Stress : Not on file  Social Connections:   . Frequency of Communication with Friends and Family: Not on file  . Frequency of Social Gatherings with Friends and Family: Not on file  . Attends Religious Services: Not on file  . Active Member of Clubs or Organizations: Not on file  . Attends Archivist Meetings: Not on file  . Marital Status: Not on file     Family History: The patient's family history includes Alcohol abuse in her father; Asthma in an other family member; Cancer in her cousin; Celiac disease in her daughter; Emphysema in her mother; Hashimoto's thyroiditis in her daughter; Heart disease in her mother; Heart failure in her mother; Liver disease in her father; Melanoma (age of onset: 41) in her brother.  ROS:   All other ROS reviewed and negative. Pertinent positives noted in the HPI.     EKGs/Labs/Other Studies Reviewed:   The following studies were personally reviewed by me today:  EKG:  EKG is ordered today.  The ekg  ordered today demonstrates normal sinus rhythm, heart 71, old anteroseptal infarct, and was personally reviewed by me.   Recent Labs: No results found for requested labs within last 8760 hours.   Recent Lipid Panel No results found for: CHOL, TRIG, HDL, CHOLHDL, VLDL, LDLCALC, LDLDIRECT  Physical Exam:   VS:  BP 122/78   Pulse 71   Ht 5' 4.5" (1.638 m)   Wt 136 lb 6.4 oz (61.9 kg)   SpO2 99%   BMI 23.05 kg/m    Wt Readings from Last 3 Encounters:  11/28/19 136 lb 6.4 oz (61.9 kg)  07/04/19 134 lb (60.8 kg)  05/31/19 134 lb 3.2 oz (60.9 kg)    General: Well nourished, well developed, in no acute distress Heart: Atraumatic, normal size  Eyes: PEERLA, EOMI  Neck: Supple, no JVD Endocrine: No thryomegaly Cardiac: Normal S1, S2; RRR; no murmurs, rubs, or gallops Lungs: Clear to auscultation bilaterally, no wheezing, rhonchi or rales  Abd: Soft, nontender, no hepatomegaly  Ext: No edema, pulses 2+ Musculoskeletal: No deformities, BUE and BLE strength normal and equal Skin: Warm and dry, no rashes   Neuro: Alert and oriented to person, place, time, and situation, CNII-XII grossly intact, no focal deficits  Psych: Normal mood and affect   ASSESSMENT:   JELIYAH MIDDLEBROOKS is a 69 y.o. female who presents for the following: 1. Nonspecific abnormal electrocardiogram (ECG) (EKG)   2. Venous insufficiency     PLAN:   1. Nonspecific abnormal electrocardiogram (ECG) (EKG) -EKG today demonstrates old anteroseptal infarct.  She had this on her EKG in 2020.  Echocardiogram 2016 was normal.  No symptoms of chest pain.  She is never had a heart attack or stroke.  CVD risk factors include hyperlipidemia with an LDL of 171.  I recommended to repeat an echocardiogram.  We will try to  get this done as she will have surgery in the next few weeks.  I highly suspect this is just breast artifact.  We will make sure she is not actually had a heart attack.  I did recommend coronary calcium scoring.  She  will think about this.  Given her LDL cholesterol 171 she may merit treatment if we knew she had coronary calcifications.  She will think about this.  She will see [place] as needed moving forward.  If her echocardiogram is abnormal she will see [place] sooner.  2. Venous insufficiency -Conservative management recommended including leg elevation and salt reduction.  Disposition: Return if symptoms worsen or fail to improve.  Medication Adjustments/Labs and Tests Ordered: Current medicines are reviewed at length with the patient today.  Concerns regarding medicines are outlined above.  Orders Placed This Encounter  Procedures  . ECHOCARDIOGRAM COMPLETE   No orders of the defined types were placed in this encounter.   Patient Instructions  Medication Instructions:  Continue same medications   Lab Work: None ordered   Testing/Procedures: Echo to be scheduled at [facility] hospital before 12/07/19   Follow-Up: At [facility], you and your health needs are our priority.  As part of our continuing mission to provide you with exceptional heart care, we have created designated Provider Care Teams.  These Care Teams include your primary Cardiologist (physician) and Advanced Practice Providers (APPs -  Physician Assistants and Nurse Practitioners) who all work together to provide you with the care you need, when you need it.  We recommend signing up for the patient portal called "MyChart".  Sign up information is provided on this After Visit Summary.  MyChart is used to connect with patients for Virtual Visits (Telemedicine).  Patients are able to view lab/test results, encounter notes, upcoming appointments, etc.  Non-urgent messages can be sent to your provider as well.   To learn more about what you can do with MyChart, go to [email or web address].    Your next appointment:  As Needed   The format for your next appointment: Office     Provider:  Dr.O'Neal      Time Spent with Patient: I  have spent a total of 25 minutes with patient reviewing hospital notes, telemetry, EKGs, labs and examining the patient as well as establishing an assessment and plan that was discussed with the patient.  > 50% of time was spent in direct patient care.  Signed, Addison Naegeli. Audie Box, [place]  [address], [place], [place] [address] [phone]  11/28/2019 9:27 AM

## 2019-11-28 ENCOUNTER — Encounter: Payer: Self-pay | Admitting: Cardiovascular Disease

## 2019-11-28 ENCOUNTER — Other Ambulatory Visit: Payer: Self-pay

## 2019-11-28 ENCOUNTER — Ambulatory Visit (INDEPENDENT_AMBULATORY_CARE_PROVIDER_SITE_OTHER): Payer: Medicare Other | Admitting: Cardiovascular Disease

## 2019-11-28 VITALS — BP 122/78 | HR 71 | Ht 64.5 in | Wt 136.4 lb

## 2019-11-28 DIAGNOSIS — R9431 Abnormal electrocardiogram [ECG] [EKG]: Secondary | ICD-10-CM

## 2019-11-28 DIAGNOSIS — I872 Venous insufficiency (chronic) (peripheral): Secondary | ICD-10-CM

## 2019-11-28 NOTE — Patient Instructions (Signed)
Medication Instructions:  Continue same medications   Lab Work: None ordered   Testing/Procedures: Echo to be scheduled at [place] before 12/07/19   Follow-Up: At [facility], you and your health needs are our priority.  As part of our continuing mission to provide you with exceptional heart care, we have created designated Provider Care Teams.  These Care Teams include your primary Cardiologist (physician) and Advanced Practice Providers (APPs -  Physician Assistants and Nurse Practitioners) who all work together to provide you with the care you need, when you need it.  We recommend signing up for the patient portal called "MyChart".  Sign up information is provided on this After Visit Summary.  MyChart is used to connect with patients for Virtual Visits (Telemedicine).  Patients are able to view lab/test results, encounter notes, upcoming appointments, etc.  Non-urgent messages can be sent to your provider as well.   To learn more about what you can do with MyChart, go to [email or web address].    Your next appointment:  As Needed   The format for your next appointment: Office     Provider:  Dr.O'Neal

## 2019-11-28 NOTE — Addendum Note (Signed)
Addended by: Wonda Horner on: 11/28/2019 10:17 AM   Modules accepted: Orders

## 2019-12-04 ENCOUNTER — Ambulatory Visit (HOSPITAL_COMMUNITY)
Admission: RE | Admit: 2019-12-04 | Discharge: 2019-12-04 | Disposition: A | Discharge status: Home / Self Care | Payer: Medicare Other | Source: Ambulatory Visit | Attending: Cardiovascular Disease | Admitting: Cardiovascular Disease

## 2019-12-04 ENCOUNTER — Other Ambulatory Visit: Payer: Self-pay

## 2019-12-04 DIAGNOSIS — I872 Venous insufficiency (chronic) (peripheral): Secondary | ICD-10-CM | POA: Diagnosis not present

## 2019-12-04 DIAGNOSIS — R9431 Abnormal electrocardiogram [ECG] [EKG]: Secondary | ICD-10-CM | POA: Diagnosis not present

## 2019-12-04 DIAGNOSIS — I351 Nonrheumatic aortic (valve) insufficiency: Secondary | ICD-10-CM | POA: Insufficient documentation

## 2019-12-04 LAB — ECHOCARDIOGRAM COMPLETE
Area-P 1/2: 2.8 cm2
S' Lateral: 3.5 cm

## 2019-12-04 NOTE — Progress Notes (Signed)
  Echocardiogram 2D Echocardiogram with 3D has been performed.  Theresa Huff M 12/04/2019, 4:00 PM

## 2019-12-07 ENCOUNTER — Other Ambulatory Visit: Payer: Self-pay | Admitting: Podiatry

## 2019-12-07 DIAGNOSIS — M25372 Other instability, left ankle: Secondary | ICD-10-CM | POA: Diagnosis not present

## 2019-12-07 DIAGNOSIS — S93492S Sprain of other ligament of left ankle, sequela: Secondary | ICD-10-CM | POA: Diagnosis not present

## 2019-12-07 DIAGNOSIS — M65872 Other synovitis and tenosynovitis, left ankle and foot: Secondary | ICD-10-CM | POA: Diagnosis not present

## 2019-12-07 DIAGNOSIS — M66372 Spontaneous rupture of flexor tendons, left ankle and foot: Secondary | ICD-10-CM | POA: Diagnosis not present

## 2019-12-07 MED ORDER — ONDANSETRON HCL 4 MG PO TABS: 4.0000 mg | ORAL_TABLET | Freq: Three times a day (TID) | ORAL | 0 refills | Status: DC | PRN

## 2019-12-07 MED ORDER — OXYCODONE HCL 5 MG PO TABS
5.0000 mg | ORAL_TABLET | ORAL | 0 refills | Status: DC | PRN
Start: 2019-12-07 — End: 2019-12-13

## 2019-12-07 MED ORDER — IBUPROFEN 600 MG PO TABS: 600.0000 mg | ORAL_TABLET | Freq: Four times a day (QID) | ORAL | 0 refills | Status: AC | PRN

## 2019-12-07 MED ORDER — ACETAMINOPHEN 500 MG PO TABS: 1000.0000 mg | ORAL_TABLET | Freq: Four times a day (QID) | ORAL | 0 refills | Status: AC | PRN

## 2019-12-07 MED ORDER — GABAPENTIN 300 MG PO CAPS: 300.0000 mg | ORAL_CAPSULE | Freq: Three times a day (TID) | ORAL | 0 refills | Status: DC

## 2019-12-07 NOTE — Progress Notes (Signed)
[place] 12/07/19 L ankle arthroscopy, peroneal tendon repair, modified Myrtice Lauth

## 2019-12-10 ENCOUNTER — Telehealth: Payer: Self-pay | Admitting: Podiatry

## 2019-12-10 NOTE — Telephone Encounter (Signed)
I will call. Thanks!

## 2019-12-10 NOTE — Telephone Encounter (Signed)
Pt called stating she is experiencing extreme pain from surgery, and would like to know what she should do about it. Please advise.

## 2019-12-13 ENCOUNTER — Ambulatory Visit (INDEPENDENT_AMBULATORY_CARE_PROVIDER_SITE_OTHER): Payer: Medicare Other

## 2019-12-13 ENCOUNTER — Ambulatory Visit (INDEPENDENT_AMBULATORY_CARE_PROVIDER_SITE_OTHER): Payer: Medicare Other | Admitting: Podiatry

## 2019-12-13 ENCOUNTER — Other Ambulatory Visit: Payer: Self-pay

## 2019-12-13 ENCOUNTER — Encounter: Payer: Self-pay | Admitting: Podiatry

## 2019-12-13 ENCOUNTER — Encounter: Payer: Medicare Other | Admitting: Podiatry

## 2019-12-13 DIAGNOSIS — S86312D Strain of muscle(s) and tendon(s) of peroneal muscle group at lower leg level, left leg, subsequent encounter: Secondary | ICD-10-CM

## 2019-12-13 DIAGNOSIS — R2689 Other abnormalities of gait and mobility: Secondary | ICD-10-CM

## 2019-12-13 MED ORDER — OXYCODONE HCL 5 MG PO TABS: 5.0000 mg | ORAL_TABLET | ORAL | 0 refills | Status: AC | PRN

## 2019-12-13 NOTE — Progress Notes (Signed)
  Subjective:  [name], female    DOB: 1951-01-14,  MRN: [id number]  Chief Complaint  Patient presents with  . Routine Post Op    POV #1 DOS 12/07/2019 LT ANKLE ARTHROSCOPY, REPAIR OF TORN TENDON, REPAIR OF LIGAMENTS ON OUTSIDE OF ANKLE    DOS: 12/07/2019 Procedure: Left ankle arthroscopy with debridement, tibial ostectomy, lateral ankle stabilization, peroneal tendon repair  69 y.o. female returns for post-op check.  Feeling well still having pain but is improving.  She does not think the gabapentin helps much.  She is taking 2-3 oxycodone daily.  No chest pain shortness of breath or calf pain reported.  Review of Systems: Negative except as noted in the HPI. Denies N/V/F/Ch.   Objective:  There were no vitals filed for this visit. There is no height or weight on file to calculate BMI. Constitutional Well developed. Well nourished.  Vascular Foot warm and well perfused. Capillary refill normal to all digits.  Negative Homans.  No calf pain on palpation  Neurologic Normal speech. Oriented to person, place, and time. Epicritic sensation to light touch grossly present bilaterally.  Dermatologic Skin healing well without signs of infection. Skin edges well coapted without signs of infection.  Orthopedic: Tenderness to palpation noted about the surgical site.   Radiographs: Interval resection of anterior tibial osteophyte Assessment:   1. Tear of peroneal tendon, left, subsequent encounter   2. Inability to bear weight    Plan:  Patient was evaluated and treated and all questions answered.  S/p foot and ankle surgery left -Progressing as expected post-operatively. -XR: Consistent with postoperative changes -WB Status: NWB in posterior splint, this was reapplied today and was well-padded.  She would like to get a rolling knee scooter and I wrote an order for this today as well -Sutures: Remain intact for 12 more days. -Medications: Refill of oxycodone sent to  pharmacy -Continue icing and elevating left lower extremity -Foot redressed with sterile Xeroform, 4 x 4 gauze, Kerlix, Ace wrap and well-padded below-knee posterior splint.  Return in 12 days (on 12/25/2019) for suture removal.

## 2019-12-25 ENCOUNTER — Ambulatory Visit (INDEPENDENT_AMBULATORY_CARE_PROVIDER_SITE_OTHER): Payer: Medicare Other | Admitting: Podiatry

## 2019-12-25 ENCOUNTER — Other Ambulatory Visit: Payer: Self-pay

## 2019-12-25 DIAGNOSIS — M25372 Other instability, left ankle: Secondary | ICD-10-CM

## 2019-12-25 DIAGNOSIS — S86312D Strain of muscle(s) and tendon(s) of peroneal muscle group at lower leg level, left leg, subsequent encounter: Secondary | ICD-10-CM

## 2019-12-25 DIAGNOSIS — Z9889 Other specified postprocedural states: Secondary | ICD-10-CM

## 2019-12-25 DIAGNOSIS — S93492S Sprain of other ligament of left ankle, sequela: Secondary | ICD-10-CM

## 2019-12-26 ENCOUNTER — Encounter: Payer: Self-pay | Admitting: Podiatry

## 2019-12-26 NOTE — Progress Notes (Signed)
°  Subjective:  [name], female    DOB: July 16, 1950,  MRN: [id number]  Chief Complaint  Patient presents with   Routine Post Op    PT stated that she is doing okay but does have some numbness and a burning sensation in her toes.    DOS: 12/07/2019 Procedure: Left ankle arthroscopy with debridement, tibial ostectomy, lateral ankle stabilization, peroneal tendon repair  69 y.o. female returns for post-op check.  Feeling well still having pain but is improving.  Only taking oxycodone at night now.  No chest pain shortness of breath or calf pain reported.  Review of Systems: Negative except as noted in the HPI. Denies N/V/F/Ch.   Objective:  There were no vitals filed for this visit. There is no height or weight on file to calculate BMI. Constitutional Well developed. Well nourished.  Vascular Foot warm and well perfused. Capillary refill normal to all digits.  Negative Homans.  No calf pain on palpation  Neurologic Normal speech. Oriented to person, place, and time. Epicritic sensation to light touch grossly present bilaterally.  Dermatologic Skin healing well without signs of infection. Skin edges well coapted without signs of infection.  Orthopedic: Tenderness to palpation noted about the surgical site.   Radiographs: Interval resection of anterior tibial osteophyte Assessment:   1. Tear of peroneal tendon, left, subsequent encounter   2. Left ankle instability   3. Sprain of anterior talofibular ligament of left ankle, sequela   4. Post-operative state    Plan:  Patient was evaluated and treated and all questions answered.  S/p foot and ankle surgery left -Progressing as expected post-operatively. -XR: Consistent with postoperative changes -WB Status: NWB in CAM boot. She has had issues with the rolling knee scooter with falls, I advised caution with this. Hopeful to begin partial WB next visit -Sutures: Removed today -Medications: No refills  sent -Continue icing and elevating left lower extremity -Steri strips applied. Can begin regular bathing  Return in about 2 weeks (around 01/08/2020).

## 2020-01-01 ENCOUNTER — Ambulatory Visit: Payer: Medicare Other | Admitting: Dermatology

## 2020-01-08 ENCOUNTER — Other Ambulatory Visit: Payer: Self-pay

## 2020-01-08 ENCOUNTER — Ambulatory Visit (INDEPENDENT_AMBULATORY_CARE_PROVIDER_SITE_OTHER): Payer: Medicare Other | Admitting: Podiatry

## 2020-01-08 ENCOUNTER — Ambulatory Visit (INDEPENDENT_AMBULATORY_CARE_PROVIDER_SITE_OTHER): Payer: Medicare Other

## 2020-01-08 DIAGNOSIS — S86312D Strain of muscle(s) and tendon(s) of peroneal muscle group at lower leg level, left leg, subsequent encounter: Secondary | ICD-10-CM | POA: Diagnosis not present

## 2020-01-08 DIAGNOSIS — M25372 Other instability, left ankle: Secondary | ICD-10-CM

## 2020-01-08 NOTE — Patient Instructions (Addendum)
Immediately you can begin putting full weight in the boot on the ankle   Start PT immediately with non weight bearing exercises for 2 weeks  Then begin weightbearing exercises for 4 additional weeks  If your pain is tolerable 2 weeks into the weightbearing exercises, you can come out of the boot and into the ankle brace with the sneaker.

## 2020-01-10 NOTE — Progress Notes (Signed)
  Subjective:  [name], female    DOB: December 25, 1950,  MRN: [id number]  Chief Complaint  Patient presents with  . Wound Check  . Routine Post Op    PT stated that she is doing well she is concerned about the swelling and numbness in the toes    DOS: 12/07/2019 Procedure: Left ankle arthroscopy with debridement, tibial ostectomy, lateral ankle stabilization, peroneal tendon repair  69 y.o. female returns for post-op check.  Minimal pain.  Not taking any narcotics.  No calf pain chest pain shortness of breath.  Review of Systems: Negative except as noted in the HPI. Denies N/V/F/Ch.   Objective:  There were no vitals filed for this visit. There is no height or weight on file to calculate BMI. Constitutional Well developed. Well nourished.  Vascular Foot warm and well perfused. Capillary refill normal to all digits.  Negative Homans.  No calf pain on palpation  Neurologic Normal speech. Oriented to person, place, and time. Epicritic sensation to light touch grossly present bilaterally.  Dermatologic  incisions are well-healed  Orthopedic:  Only mild tenderness over the peroneal tendons.  No pain over the anterior ankle or lateral ankle ligaments.  Negative anterior drawer with good stability.  Still with mild to moderate edema of the forefoot and ankle   Radiographs: Interval resection of anterior tibial osteophyte Assessment:   1. Tear of peroneal tendon, left, subsequent encounter   2. Left ankle instability    Plan:  Patient was evaluated and treated and all questions answered.  S/p foot and ankle surgery left -Progressing as expected post-operatively. -XR: Consistent with postoperative changes -WB Status: Begin weightbearing in CAM boot.  I would like her to begin nonweightbearing exercises with physical therapy for 2 weeks.  Following this she may begin full weightbearing exercises and stability.  In 4 weeks total if she feels good she may begin to  ambulate in a Tri-Lock ankle brace and shoe which I dispensed today. -Continue icing and elevating left lower extremity -Renewal prescription sent for physical therapy for benchmark  Return in about 6 weeks (around 02/19/2020).

## 2020-02-19 ENCOUNTER — Other Ambulatory Visit: Payer: Self-pay

## 2020-02-19 ENCOUNTER — Ambulatory Visit (INDEPENDENT_AMBULATORY_CARE_PROVIDER_SITE_OTHER): Payer: Medicare Other | Admitting: Podiatry

## 2020-02-19 DIAGNOSIS — S93492S Sprain of other ligament of left ankle, sequela: Secondary | ICD-10-CM

## 2020-02-19 DIAGNOSIS — Z9889 Other specified postprocedural states: Secondary | ICD-10-CM

## 2020-02-19 DIAGNOSIS — G5792 Unspecified mononeuropathy of left lower limb: Secondary | ICD-10-CM

## 2020-02-19 DIAGNOSIS — M25372 Other instability, left ankle: Secondary | ICD-10-CM

## 2020-02-19 DIAGNOSIS — S86312D Strain of muscle(s) and tendon(s) of peroneal muscle group at lower leg level, left leg, subsequent encounter: Secondary | ICD-10-CM

## 2020-02-19 MED ORDER — LIDOCAINE-PRILOCAINE 2.5-2.5 % EX CREA: 1.0000 "application " | TOPICAL_CREAM | CUTANEOUS | 2 refills | Status: DC | PRN

## 2020-02-19 NOTE — Patient Instructions (Signed)
Discontinue the brace    Plantar Fasciitis (Heel Spur Syndrome) with Rehab The plantar fascia is a fibrous, ligament-like, soft-tissue structure that spans the bottom of the foot. Plantar fasciitis is a condition that causes pain in the foot due to inflammation of the tissue. SYMPTOMS   Pain and tenderness on the underneath side of the foot.  Pain that worsens with standing or walking. CAUSES  Plantar fasciitis is caused by irritation and injury to the plantar fascia on the underneath side of the foot. Common mechanisms of injury include:  Direct trauma to bottom of the foot.  Damage to a small nerve that runs under the foot where the main fascia attaches to the heel bone.  Stress placed on the plantar fascia due to bone spurs. RISK INCREASES WITH:   Activities that place stress on the plantar fascia (running, jumping, pivoting, or cutting).  Poor strength and flexibility.  Improperly fitted shoes.  Tight calf muscles.  Flat feet.  Failure to warm-up properly before activity.  Obesity. PREVENTION  Warm up and stretch properly before activity.  Allow for adequate recovery between workouts.  Maintain physical fitness:  Strength, flexibility, and endurance.  Cardiovascular fitness.  Maintain a health body weight.  Avoid stress on the plantar fascia.  Wear properly fitted shoes, including arch supports for individuals who have flat feet.  PROGNOSIS  If treated properly, then the symptoms of plantar fasciitis usually resolve without surgery. However, occasionally surgery is necessary.  RELATED COMPLICATIONS   Recurrent symptoms that may result in a chronic condition.  Problems of the lower back that are caused by compensating for the injury, such as limping.  Pain or weakness of the foot during push-off following surgery.  Chronic inflammation, scarring, and partial or complete fascia tear, occurring more often from repeated injections.  TREATMENT   Treatment initially involves the use of ice and medication to help reduce pain and inflammation. The use of strengthening and stretching exercises may help reduce pain with activity, especially stretches of the Achilles tendon. These exercises may be performed at home or with a therapist. Your caregiver may recommend that you use heel cups of arch supports to help reduce stress on the plantar fascia. Occasionally, corticosteroid injections are given to reduce inflammation. If symptoms persist for greater than 6 months despite non-surgical (conservative), then surgery may be recommended.   MEDICATION   If pain medication is necessary, then nonsteroidal anti-inflammatory medications, such as aspirin and ibuprofen, or other minor pain relievers, such as acetaminophen, are often recommended.  Do not take pain medication within 7 days before surgery.  Prescription pain relievers may be given if deemed necessary by your caregiver. Use only as directed and only as much as you need.  Corticosteroid injections may be given by your caregiver. These injections should be reserved for the most serious cases, because they may only be given a certain number of times.  HEAT AND COLD  Cold treatment (icing) relieves pain and reduces inflammation. Cold treatment should be applied for 10 to 15 minutes every 2 to 3 hours for inflammation and pain and immediately after any activity that aggravates your symptoms. Use ice packs or massage the area with a piece of ice (ice massage).  Heat treatment may be used prior to performing the stretching and strengthening activities prescribed by your caregiver, physical therapist, or athletic trainer. Use a heat pack or soak the injury in warm water.  SEEK IMMEDIATE MEDICAL CARE IF:  Treatment seems to offer no benefit, or  the condition worsens.  Any medications produce adverse side effects.  EXERCISES- RANGE OF MOTION (ROM) AND STRETCHING EXERCISES - Plantar Fasciitis  (Heel Spur Syndrome) These exercises may help you when beginning to rehabilitate your injury. Your symptoms may resolve with or without further involvement from your physician, physical therapist or athletic trainer. While completing these exercises, remember:   Restoring tissue flexibility helps normal motion to return to the joints. This allows healthier, less painful movement and activity.  An effective stretch should be held for at least 30 seconds.  A stretch should never be painful. You should only feel a gentle lengthening or release in the stretched tissue.  RANGE OF MOTION - Toe Extension, Flexion  Sit with your right / left leg crossed over your opposite knee.  Grasp your toes and gently pull them back toward the top of your foot. You should feel a stretch on the bottom of your toes and/or foot.  Hold this stretch for 10 seconds.  Now, gently pull your toes toward the bottom of your foot. You should feel a stretch on the top of your toes and or foot.  Hold this stretch for 10 seconds. Repeat  times. Complete this stretch 3 times per day.   RANGE OF MOTION - Ankle Dorsiflexion, Active Assisted  Remove shoes and sit on a chair that is preferably not on a carpeted surface.  Place right / left foot under knee. Extend your opposite leg for support.  Keeping your heel down, slide your right / left foot back toward the chair until you feel a stretch at your ankle or calf. If you do not feel a stretch, slide your bottom forward to the edge of the chair, while still keeping your heel down.  Hold this stretch for 10 seconds. Repeat 3 times. Complete this stretch 2 times per day.   STRETCH  Gastroc, Standing  Place hands on wall.  Extend right / left leg, keeping the front knee somewhat bent.  Slightly point your toes inward on your back foot.  Keeping your right / left heel on the floor and your knee straight, shift your weight toward the wall, not allowing your back to  arch.  You should feel a gentle stretch in the right / left calf. Hold this position for 10 seconds. Repeat 3 times. Complete this stretch 2 times per day.  STRETCH  Soleus, Standing  Place hands on wall.  Extend right / left leg, keeping the other knee somewhat bent.  Slightly point your toes inward on your back foot.  Keep your right / left heel on the floor, bend your back knee, and slightly shift your weight over the back leg so that you feel a gentle stretch deep in your back calf.  Hold this position for 10 seconds. Repeat 3 times. Complete this stretch 2 times per day.  STRETCH  Gastrocsoleus, Standing  Note: This exercise can place a lot of stress on your foot and ankle. Please complete this exercise only if specifically instructed by your caregiver.   Place the ball of your right / left foot on a step, keeping your other foot firmly on the same step.  Hold on to the wall or a rail for balance.  Slowly lift your other foot, allowing your body weight to press your heel down over the edge of the step.  You should feel a stretch in your right / left calf.  Hold this position for 10 seconds.  Repeat this exercise with a  slight bend in your right / left knee. Repeat 3 times. Complete this stretch 2 times per day.   STRENGTHENING EXERCISES - Plantar Fasciitis (Heel Spur Syndrome)  These exercises may help you when beginning to rehabilitate your injury. They may resolve your symptoms with or without further involvement from your physician, physical therapist or athletic trainer. While completing these exercises, remember:   Muscles can gain both the endurance and the strength needed for everyday activities through controlled exercises.  Complete these exercises as instructed by your physician, physical therapist or athletic trainer. Progress the resistance and repetitions only as guided.  STRENGTH - Towel Curls  Sit in a chair positioned on a non-carpeted surface.  Place  your foot on a towel, keeping your heel on the floor.  Pull the towel toward your heel by only curling your toes. Keep your heel on the floor. Repeat 3 times. Complete this exercise 2 times per day.  STRENGTH - Ankle Inversion  Secure one end of a rubber exercise band/tubing to a fixed object (table, pole). Loop the other end around your foot just before your toes.  Place your fists between your knees. This will focus your strengthening at your ankle.  Slowly, pull your big toe up and in, making sure the band/tubing is positioned to resist the entire motion.  Hold this position for 10 seconds.  Have your muscles resist the band/tubing as it slowly pulls your foot back to the starting position. Repeat 3 times. Complete this exercises 2 times per day.  Document Released: 01/11/2005 Document Revised: 04/05/2011 Document Reviewed: 04/25/2008 [facility] Patient Information 2014 [place], [place].

## 2020-02-21 ENCOUNTER — Telehealth: Payer: Self-pay | Admitting: *Deleted

## 2020-02-21 ENCOUNTER — Encounter: Payer: Self-pay | Admitting: Podiatry

## 2020-02-21 NOTE — Telephone Encounter (Signed)
Called patient,left VM for return call back concerning Lidocaine / [employer].   Spoke with patient and informed her per Dr Sherryle Lis that the medication (Licocaine) was denied by insurance and that she may try OTC. Patient verbalized understanding and said that she has purchased and has been using for about 3 days and it is helping some.

## 2020-02-21 NOTE — Progress Notes (Signed)
  Subjective:  [name], female    DOB: Jun 01, 1950,  MRN: [id number]  Chief Complaint  Patient presents with  . Foot Pain    PT stated that she has a tingling sensation that keeps her up at night.    DOS: 12/07/2019 Procedure: Left ankle arthroscopy with debridement, tibial ostectomy, lateral ankle stabilization, peroneal tendon repair  70 y.o. female returns for post-op check.  She is doing quite well along the lateral ankle.  She does have a burning sensation that keeps her up at night and tingling on the dorsal medial foot and ankle  Review of Systems: Negative except as noted in the HPI. Denies N/V/F/Ch.   Objective:  There were no vitals filed for this visit. There is no height or weight on file to calculate BMI. Constitutional Well developed. Well nourished.  Vascular Foot warm and well perfused. Capillary refill normal to all digits.  Negative Homans.  No calf pain on palpation  Neurologic Normal speech. Oriented to person, place, and time. Epicritic sensation to light touch grossly present bilaterally.  Dermatologic  incisions are well-healed  Orthopedic:  No tenderness over peroneal tendons or lateral ankle ligament complex.  She has no instability.  She does have tenderness over the saphenous nerve adjacent to the medial portal scar which is well-healed   Radiographs: Interval resection of anterior tibial osteophyte Assessment:   1. Tear of peroneal tendon, left, subsequent encounter   2. Left ankle instability   3. Sprain of anterior talofibular ligament of left ankle, sequela   4. Post-operative state   5. Neuritis of left foot    Plan:  Patient was evaluated and treated and all questions answered.  S/p foot and ankle surgery left -Doing quite well status post left peroneal tendon repair and ankle arthroscopy and stabilization -She does have a mild neuritis, mostly along the dorsal medial cutaneous nerve.  Prescription for lidocaine prilocaine  cream was given to her.  I think she should also discontinue the trial ankle brace as she has very good stability and strength.  The ankle brace may be also contributing to compression of the nerve. -Resume full activities -Continue PT for as long as she feels like it is beneficial, allow her to discuss this with her therapist  Return in about 2 months (around 04/18/2020).

## 2020-03-14 ENCOUNTER — Other Ambulatory Visit: Payer: Self-pay | Admitting: Podiatry

## 2020-03-14 NOTE — Telephone Encounter (Signed)
Please advise 

## 2020-03-27 ENCOUNTER — Other Ambulatory Visit: Payer: Self-pay | Admitting: Hematology and Oncology

## 2020-04-22 ENCOUNTER — Encounter: Payer: Self-pay | Admitting: Podiatry

## 2020-04-22 ENCOUNTER — Other Ambulatory Visit: Payer: Self-pay

## 2020-04-22 ENCOUNTER — Ambulatory Visit (INDEPENDENT_AMBULATORY_CARE_PROVIDER_SITE_OTHER): Payer: Medicare Other | Admitting: Podiatry

## 2020-04-22 DIAGNOSIS — M25372 Other instability, left ankle: Secondary | ICD-10-CM

## 2020-04-22 DIAGNOSIS — S93492S Sprain of other ligament of left ankle, sequela: Secondary | ICD-10-CM

## 2020-04-22 DIAGNOSIS — G5792 Unspecified mononeuropathy of left lower limb: Secondary | ICD-10-CM | POA: Diagnosis not present

## 2020-04-22 DIAGNOSIS — S86312D Strain of muscle(s) and tendon(s) of peroneal muscle group at lower leg level, left leg, subsequent encounter: Secondary | ICD-10-CM

## 2020-04-22 MED ORDER — DEXAMETHASONE SODIUM PHOSPHATE 4 MG/ML IJ SOLN
8.0000 mg | Freq: Once | INTRAMUSCULAR | Status: AC
  Administered 2020-04-22: 8 mg

## 2020-04-22 MED ORDER — BETAMETHASONE DIPROPIONATE 0.05 % EX CREA: TOPICAL_CREAM | Freq: Every day | CUTANEOUS | 1 refills | Status: DC

## 2020-04-22 NOTE — Progress Notes (Signed)
  Subjective:  [name], female    DOB: 12-Jun-1950,  MRN: [id number]  Chief Complaint  Patient presents with  . Routine Post Op    PT stated that she is doing well she stated that she does still have that tingling sensation in her foot     DOS: 12/07/2019 Procedure: Left ankle arthroscopy with debridement, tibial ostectomy, lateral ankle stabilization, peroneal tendon repair  70 y.o. female returns for 20-month postop visit.  She is doing well is done with therapy.  She says she has not really done a whole lot with the ankle yet because she is nervous about really straining it.  She does not have the burning and tingling sensation.  The cream has not really helped a whole lot.  Review of Systems: Negative except as noted in the HPI. Denies N/V/F/Ch.   Objective:  There were no vitals filed for this visit. There is no height or weight on file to calculate BMI. Constitutional Well developed. Well nourished.  Vascular Foot warm and well perfused. Capillary refill normal to all digits.  Negative Homans.  No calf pain on palpation  Neurologic Normal speech. Oriented to person, place, and time. Epicritic sensation to light touch grossly present bilaterally.  Dermatologic  incisions are well-healed  Orthopedic:  No tenderness over peroneal tendons or lateral ankle ligament complex.  She has no instability.  She does have tenderness over the saphenous nerve adjacent to the medial portal scar which is well-healed as well as now the lateral scar and portal over the intermediate dorsal cutaneous nerve   Radiographs: Interval resection of anterior tibial osteophyte Assessment:   1. Tear of peroneal tendon, left, subsequent encounter   2. Left ankle instability   3. Sprain of anterior talofibular ligament of left ankle, sequela   4. Neuritis of left foot    Plan:  Patient was evaluated and treated and all questions answered.  S/p foot and ankle surgery left -Doing quite  well status post left peroneal tendon repair and ankle arthroscopy and stabilization -The neuritis is still persistent.  She may have a possible irritation of the nerve along the medial or lateral border of the lateral stabilization incision as well.  I think this will continue to improve and eventually resolve uneventfully but we should give her some expedited relief.  Discussed injection of a steroid to from scar tissue along the incision today.  Using ethyl chloride for a topical anesthetic, I injected a total combined volume of 8 mg of dexamethasone along all 3 incisions.  She tolerated this well -Discussed with her she should increase her activity level and begin to really use the ankle more more to see how she does.  Discussed supportive shoe gear.  Return in about 2 months (around 06/22/2020) for re-check ankle ligaments and tendons, 6 month post op visit.

## 2020-04-26 ENCOUNTER — Other Ambulatory Visit: Payer: Self-pay | Admitting: Physician Assistant

## 2020-04-28 NOTE — Telephone Encounter (Signed)
Please advise 

## 2020-04-30 ENCOUNTER — Encounter: Payer: Self-pay | Admitting: Podiatry

## 2020-05-12 ENCOUNTER — Telehealth: Payer: Self-pay | Admitting: Hematology and Oncology

## 2020-05-12 NOTE — Telephone Encounter (Signed)
R/s appt per 4/18 sch msg. Pt aware.  

## 2020-05-28 ENCOUNTER — Ambulatory Visit: Payer: Medicare Other | Admitting: Hematology and Oncology

## 2020-06-08 NOTE — Progress Notes (Signed)
Patient Care Team: Antony Contras, MD as PCP - General (Family Medicine) Elsie Stain, MD (Pulmonary Disease) Rolm Bookbinder, MD as Consulting Physician (General Surgery) Nicholas Lose, MD as Consulting Physician (Hematology and Oncology) Rockwell Germany, RN as Registered Nurse Mauro Kaufmann, RN as Registered Nurse Causey, Charlestine Massed, NP as Nurse Practitioner (Hematology and Oncology) Maxwell Marion, RN as Registered Nurse (Medical Oncology) Lavonna Monarch, MD as Consulting Physician (Dermatology)  DIAGNOSIS:    ICD-10-CM   1. Malignant neoplasm of upper-outer quadrant of left breast in female, estrogen receptor positive ([place])  C50.412    Z17.0     SUMMARY OF ONCOLOGIC HISTORY: Oncology History  Breast cancer of upper-outer quadrant of left female breast ([place])  01/30/2014 Mammogram   Left breast: 2.4 cm irregular high density mass with indistinct margin, middle depth, 6.8 cm from nipple, with architectural distortion. There is also a 3.5 cm irregular high density asymmetry at 1:00, anterior depth, with arch distortion and thickening.   01/30/2014 Breast US   Left breast: 3.7 cm irregular mass with indistinct margin at 2:00, posterior deth, 4 cm from nipple, hypoechoic. 10 x 1 cm irregular mass with lobulated and indistinct margin at 3:00, posterior depth. A third 0.9 cm lobulated mass at 1:00, posterior depth   02/04/2014 Initial Biopsy   Left breast core needle bx x 3: Invasive ductal carcinoma with DCIS ER 100%, PR 100%, HER-2 negative, Ki-67 ranged from 19-26%; third biopsy showed invasive mammary cancer with lobular features and intraductal papilloma, ER/PR positive HER-2 negative   02/11/2014 Breast MRI   Left breast: 3 masses that are confluent measuring 5.6 x 2.7 x 3 cm extending to the left aspect of the areola, appearing to involve the skin of theareola, deviating the nipple towards the left.  No axillary findings.  Neg rightt breast.   02/13/2014 Clinical  Stage   Stage IIB: T3 N0   03/07/2014 Definitive Surgery   Left mastectomy/SLNB: invasive ductal carcinoma grade 1; 4.8 cm with low-grade DCIS; margins negative 1/4 lymph nodes positive, ER 100% PR 100% HER-2 negative ratio 1.18 Ki-67 26% and 19%    03/07/2014 Pathologic Stage   Stage IIB (T2 N1)   04/03/2014 - 08/13/2014 Chemotherapy   Adjuvant chemotherapy with dose dense adriamycin and cytoxan followed by weekly abraxane 12   09/09/2014 - 10/17/2014 Radiation Therapy   Adjuvant RT [name]): Left chest wall 50.4 Gy over 28 fractions. Left supraclavicular fossa and axilla: 45 Gy over 25 fractions   10/27/2014 -  Anti-estrogen oral therapy   Anastrozole 1 mg daily plus Ibrance ([name] Clinical Trial stopped 06/11/16)   01/16/2015 Survivorship   Survivorship visit completed and copy of care plan provided to patient.   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   A variant of uncertain significance (VUS) in a gene called MLH1 was also noted. c.622C>T (p.Pro208Ser)  The Multi-Cancer Panel offered by [facility] includes sequencing and/or deletion duplication testing of the following 90 genes: AIP, ALK, APC, ATM, AXIN2, BAP1, BARD1, BLM, BMPR1A, BRCA1, BRCA2, BRIP1, BUB1B, CASR, CDC73, CDH1, CDK4, CDKN1B, CDKN1C, CDKN2A, CEBPA, CHEK2, CTNNA1, DICER1, DIS3L2, EGFR, ENG, EPCAM, FH, FLCN, GALNT12, GATA2, GPC3, GREM1, HOXB13, HRAS, KIT, MAX, MEN1, MET, MITF, MLH1, MLH3, MSH2, MSH3, MSH6, MUTYH, NBN, NF1, NF2, NTHL1, PALB2, PDGFRA, PHOX2B, PMS2, POLD1, POLE, POT1, PRKAR1A, PTCH1, PTEN, RAD50, RAD51C, RAD51D, RB1, RECQL4, RET, RNF43, RPS20, RUNX1, SDHA, SDHAF2, SDHB, SDHC, SDHD, SMAD4, SMARCA4, SMARCB1,  SMARCE1, STK11, SUFU, TERC, TERT, TMEM127, TP53, TSC1, TSC2, VHL, WRN, WT1   Melanoma of skin ([place])  12/27/2017 Initial Diagnosis   Melanoma of skin ([place])     CHIEF COMPLIANT: Follow-up of left breast cancer on  letrozole therapy  INTERVAL HISTORY: Theresa Huff is a 70 y.o. with above-mentioned history of left breast cancer treated with left mastectomy, adjuvant chemotherapy, radiation, and is currently on antiestrogen therapy with letrozole. She is a participant in the [place] clinical trial. She presents to the clinic today for annual follow-up. She has intermittent pain in the left breast but able to tolerate it well.  Denies any significant hot flashes with the letrozole.   She came back from a very long road trip to [place] and [place]:  is allergic to hydrocodone; tramadol; codeine; iodine; radiaplexrx [skin protectants, misc.]; sulfa antibiotics; tape; and primidone.  MEDICATIONS:  Current Outpatient Medications  Medication Sig Dispense Refill  . betamethasone dipropionate 0.05 % cream Apply topically daily. 45 g 1  . cetirizine (ZYRTEC) 10 MG tablet Take 10 mg by mouth daily.      . clobetasol cream (TEMOVATE) 0.05 % Apply to affected area after bathing daily 60 g 1  . DULoxetine (CYMBALTA) 60 MG capsule     . fluticasone (FLONASE) 50 MCG/ACT nasal spray     . letrozole (FEMARA) 2.5 MG tablet TAKE 1 TABLET BY MOUTH EVERY DAY 90 tablet 0  . lidocaine-prilocaine (EMLA) cream Apply 1 application topically as needed. 30 g 2  . LORazepam (ATIVAN) 2 MG tablet TAKE 1 TABLET BY MOUTH AT BEDTIME AS NEEDED FOR INSOMNIA  0  . meloxicam (MOBIC) 15 MG tablet TAKE 1 TABLET BY MOUTH EVERY DAY 30 tablet 3  . omeprazole (PRILOSEC) 40 MG capsule Take 1 capsule (40 mg total) by mouth as needed.  0  . ondansetron (ZOFRAN) 4 MG tablet Take 1 tablet (4 mg total) by mouth every 8 (eight) hours as needed for nausea or vomiting. 20 tablet 0  . SUMAtriptan (IMITREX) 50 MG tablet TAKE 1 TABLET BY MOUTH EVERY 2 HOURS AS NEEDED FOR MIGRAINE MAY REPEAT IN 2 HRS IF HEADACHE PERSISTS 9 tablet 1  . tiZANidine (ZANAFLEX) 4 MG tablet Take 1 tablet (4 mg total) by mouth 3 (three) times daily as needed. 40 tablet 1  .  triamcinolone cream (KENALOG) 0.1 % APPLY TO AFFECTED AREAS AS DIRECTED    . Vitamin D, Ergocalciferol, (DRISDOL) 50000 UNITS CAPS capsule Take 50,000 Units by mouth every 7 (seven) days.     No current facility-administered medications for this visit.    PHYSICAL EXAMINATION: ECOG PERFORMANCE STATUS: 1 - Symptomatic but completely ambulatory  Vitals:   06/09/20 0858  BP: 127/72  Pulse: 80  Resp: 18  Temp: (!) 97.5 F (36.4 C)  SpO2: 98%   Filed Weights   06/09/20 0858  Weight: 137 lb (62.1 kg)    BREAST: No palpable masses or nodules in either right or left breasts. No palpable axillary supraclavicular or infraclavicular adenopathy no breast tenderness or nipple discharge. (exam performed in the presence of a chaperone)  LABORATORY DATA:  I have reviewed the data as listed CMP Latest Ref Rng & Units 09/13/2016 06/09/2016 05/10/2016  Glucose 70 - 140 mg/dl 115 99 88  BUN 7.0 - 26.0 mg/dL 15.4 20.7 15.7  Creatinine 0.6 - 1.1 mg/dL 0.9 0.9 0.8  Sodium 136 - 145 mEq/L 139 141 144  Potassium 3.5 - 5.1 mEq/L 5.0 4.4 4.7  Chloride  101 - 111 mmol/L - - -  CO2 22 - 29 mEq/L 26 26 27   Calcium 8.4 - 10.4 mg/dL 9.6 10.6(H) 9.9  Total Protein 6.4 - 8.3 g/dL 6.4 7.7 6.6  Total Bilirubin 0.20 - 1.20 mg/dL 0.57 1.33(H) 0.76  Alkaline Phos 40 - 150 U/L 77 85 71  AST 5 - 34 U/L 14 13 14   ALT 0 - 55 U/L 16 19 14     Lab Results  Component Value Date   WBC 3.9 09/13/2016   HGB 12.9 09/13/2016   HCT 40.9 09/13/2016   MCV 94.5 09/13/2016   PLT 210 09/13/2016   NEUTROABS 1.8 09/13/2016    ASSESSMENT & PLAN:  Breast cancer of upper-outer quadrant of left female breast Left breast invasive ductal carcinoma grade 1; 4.8 cm with low-grade DCIS; margins negative 1/4 lymph nodes positive, ER 100% PR 100% HER-2 negative ratio 1.18 Ki-67 26% and 19% T2 N1 stage IIB status post adjuvant chemotherapy with dose dense Adriamycin and Cytoxan 4 followed by Abraxane weekly 12 Completed Adjuvant  radiation therapy 10/17/14 ------------------------------------------------------------------------------- Current Treatment: [name] study: Randomized to Ibrance + Anastrozole Started 10/27/2014;Discontinued Palbociclib for toxicities 06/11/2016(primarily neutropenia); Switched to Letrozole 09/13/2016 (shoulder pain)  Letrozole toxicities: Very occasional hot flashes. Does not have the myalgias related to prior anastrozole  Surveillance: 1. Breast Exam:  06/09/2020: Benign 2. [facility]: [facility]: No evidence of malignancy breast density category B  Patellar fracture:  Bone density 12/19/2018: T score -2.2: Osteopenia: Recommended bisphosphonate therapy with calcium and vitamin D  Return to clinic in1 yearfor follow-up    No orders of the defined types were placed in this encounter.  The patient has a good understanding of the overall plan. she agrees with it. she will call with any problems that may develop before the next visit here.  Total time spent: 30 mins including face to face time and time spent for planning, charting and coordination of care  Rulon Eisenmenger, MD, MPH 06/09/2020  I, Cloyde Reams Dorshimer, am acting as scribe for Dr. Nicholas Lose.  I have reviewed the above documentation for accuracy and completeness, and I agree with the above.

## 2020-06-09 ENCOUNTER — Inpatient Hospital Stay: Payer: Medicare Other | Attending: Hematology and Oncology | Admitting: Hematology and Oncology

## 2020-06-09 ENCOUNTER — Other Ambulatory Visit: Payer: Self-pay

## 2020-06-09 DIAGNOSIS — Z9012 Acquired absence of left breast and nipple: Secondary | ICD-10-CM | POA: Insufficient documentation

## 2020-06-09 DIAGNOSIS — Z170 Estrogen receptor positive status [ER+]: Secondary | ICD-10-CM

## 2020-06-09 DIAGNOSIS — C439 Malignant melanoma of skin, unspecified: Secondary | ICD-10-CM | POA: Diagnosis not present

## 2020-06-09 DIAGNOSIS — C50412 Malignant neoplasm of upper-outer quadrant of left female breast: Secondary | ICD-10-CM | POA: Diagnosis present

## 2020-06-09 DIAGNOSIS — M8580 Other specified disorders of bone density and structure, unspecified site: Secondary | ICD-10-CM | POA: Insufficient documentation

## 2020-06-09 DIAGNOSIS — Z9221 Personal history of antineoplastic chemotherapy: Secondary | ICD-10-CM | POA: Diagnosis not present

## 2020-06-09 DIAGNOSIS — Z79811 Long term (current) use of aromatase inhibitors: Secondary | ICD-10-CM | POA: Diagnosis not present

## 2020-06-09 DIAGNOSIS — Z923 Personal history of irradiation: Secondary | ICD-10-CM | POA: Diagnosis not present

## 2020-06-09 MED ORDER — LETROZOLE 2.5 MG PO TABS: 2.5000 mg | ORAL_TABLET | Freq: Every day | ORAL | 3 refills | Status: DC

## 2020-06-09 NOTE — Assessment & Plan Note (Signed)
Left breast invasive ductal carcinoma grade 1; 4.8 cm with low-grade DCIS; margins negative 1/4 lymph nodes positive, ER 100% PR 100% HER-2 negative ratio 1.18 Ki-67 26% and 19% T2 N1 stage IIB status post adjuvant chemotherapy with dose dense Adriamycin and Cytoxan 4 followed by Abraxane weekly 12 Completed Adjuvant radiation therapy 10/17/14 ------------------------------------------------------------------------------- Current Treatment: [name] study: Randomized to Ibrance + Anastrozole Started 10/27/2014;Discontinued Palbociclib for toxicities 06/11/2016(primarily neutropenia); Switched to Letrozole 09/13/2016 (shoulder pain)  Letrozole toxicities: Very occasional hot flashes. Does not have the myalgias related to prior anastrozole  Surveillance: 1. Breast Exam:  06/09/2020: Benign 2. Mammograms2/11/2020 at [facility]: [place]: No evidence of malignancy breast density category B  Patellar fracture:  Bone density 12/19/2018: T score -2.2: Osteopenia: Recommended bisphosphonate therapy with calcium and vitamin D  Return to clinic in1 yearfor follow-up

## 2020-06-26 ENCOUNTER — Ambulatory Visit: Payer: Medicare Other | Admitting: Podiatry

## 2020-07-01 ENCOUNTER — Ambulatory Visit: Payer: Medicare Other | Admitting: Podiatry

## 2020-07-14 ENCOUNTER — Ambulatory Visit (INDEPENDENT_AMBULATORY_CARE_PROVIDER_SITE_OTHER): Payer: Medicare Other | Admitting: Podiatry

## 2020-07-14 ENCOUNTER — Encounter: Payer: Self-pay | Admitting: Podiatry

## 2020-07-14 ENCOUNTER — Other Ambulatory Visit: Payer: Self-pay

## 2020-07-14 DIAGNOSIS — G5792 Unspecified mononeuropathy of left lower limb: Secondary | ICD-10-CM | POA: Diagnosis not present

## 2020-07-14 DIAGNOSIS — S86312D Strain of muscle(s) and tendon(s) of peroneal muscle group at lower leg level, left leg, subsequent encounter: Secondary | ICD-10-CM | POA: Diagnosis not present

## 2020-07-14 DIAGNOSIS — M25372 Other instability, left ankle: Secondary | ICD-10-CM | POA: Diagnosis not present

## 2020-07-14 DIAGNOSIS — S93492S Sprain of other ligament of left ankle, sequela: Secondary | ICD-10-CM | POA: Diagnosis not present

## 2020-07-15 ENCOUNTER — Encounter: Payer: Self-pay | Admitting: Podiatry

## 2020-07-15 NOTE — Progress Notes (Signed)
  Subjective:  [name], female    DOB: [birth year],  MRN: [id number]  Chief Complaint  Patient presents with   Follow-up    Right ankle ligament and tendon follow up. Pt states that she still has aches,swelling, and numbness.    DOS: 12/07/2019 Procedure: Left ankle arthroscopy with debridement, tibial ostectomy, lateral ankle stabilization, peroneal tendon repair  70 y.o. female returns for 82-month postop visit.  She has had improvement.  Still occasional aches numbness and tingling but has improved quite a bit.  Review of Systems: Negative except as noted in the HPI. Denies N/V/F/Ch.   Objective:  There were no vitals filed for this visit. There is no height or weight on file to calculate BMI. Constitutional Well developed. Well nourished.  Vascular Foot warm and well perfused. Capillary refill normal to all digits.  Negative Homans.  No calf pain on palpation  Neurologic Normal speech. Oriented to person, place, and time. Epicritic sensation to light touch grossly present bilaterally.  Dermatologic  incisions are well-healed  Orthopedic:  No tenderness over peroneal tendons or lateral ankle ligament complex.  She has no instability.  Less tenderness over the portal scars, scar not hypertrophic.  Mild tenderness over the peroneal tendons   Radiographs: Interval resection of anterior tibial osteophyte Assessment:   1. Tear of peroneal tendon, left, subsequent encounter   2. Left ankle instability   3. Sprain of anterior talofibular ligament of left ankle, sequela   4. Neuritis of left foot     Plan:  Patient was evaluated and treated and all questions answered.  S/p foot and ankle surgery left -Overall doing well and is much better than she was preoperatively.  None Hunter percent pain-free and I think she may continue to improve throughout the rest of the year until she is 1 year out from surgery.  After that I do not think will change much.  I think she  continue resume her activities completely as tolerated impact and low impact.  Return as needed if still bothersome in 1 year or if worsens or new symptoms arise  Return if symptoms worsen or fail to improve.

## 2020-07-21 ENCOUNTER — Other Ambulatory Visit: Payer: Self-pay | Admitting: Podiatry

## 2020-08-11 ENCOUNTER — Ambulatory Visit: Payer: Medicare Other | Admitting: Dermatology

## 2020-10-11 ENCOUNTER — Other Ambulatory Visit: Payer: Self-pay | Admitting: Hematology and Oncology

## 2020-10-22 ENCOUNTER — Encounter: Payer: Self-pay | Admitting: Dermatology

## 2020-10-22 ENCOUNTER — Ambulatory Visit (INDEPENDENT_AMBULATORY_CARE_PROVIDER_SITE_OTHER): Payer: Medicare Other | Admitting: Dermatology

## 2020-10-22 ENCOUNTER — Other Ambulatory Visit: Payer: Self-pay

## 2020-10-22 DIAGNOSIS — L821 Other seborrheic keratosis: Secondary | ICD-10-CM | POA: Diagnosis not present

## 2020-10-22 DIAGNOSIS — Z86018 Personal history of other benign neoplasm: Secondary | ICD-10-CM

## 2020-10-22 DIAGNOSIS — Z85820 Personal history of malignant melanoma of skin: Secondary | ICD-10-CM

## 2020-10-22 DIAGNOSIS — Z1283 Encounter for screening for malignant neoplasm of skin: Secondary | ICD-10-CM

## 2020-10-22 DIAGNOSIS — Z85828 Personal history of other malignant neoplasm of skin: Secondary | ICD-10-CM | POA: Diagnosis not present

## 2020-10-22 DIAGNOSIS — L820 Inflamed seborrheic keratosis: Secondary | ICD-10-CM

## 2020-11-07 ENCOUNTER — Encounter: Payer: Self-pay | Admitting: Dermatology

## 2020-11-07 NOTE — Progress Notes (Signed)
   Follow-Up Visit   Subjective  Theresa Huff is a 70 y.o. female who presents for the following: Annual Exam (Many keratoses to check on the back and legs. Patient has history of scc, mm and atypia ).  General skin examination, 1 area on back itching Location:  Duration:  Quality:  Associated Signs/Symptoms: Modifying Factors:  Severity:  Timing: Context:   Objective  Well appearing patient in no apparent distress; mood and affect are within normal limits. Mid Back Full body skin exam. No non mole skin cancers or melanoma or atypical mole.   Right Lower Back Inflamed flattopped pink textured papule  Mid Back Multiple noninflamed tan-brown papules of varying sizes.    A full examination was performed including scalp, head, eyes, ears, nose, lips, neck, chest, axillae, abdomen, back, buttocks, bilateral upper extremities, bilateral lower extremities, hands, feet, fingers, toes, fingernails, and toenails. All findings within normal limits unless otherwise noted below.  Areas beneath undergarments not fully examined.   Assessment & Plan    Screening for malignant neoplasm of skin Mid Back  Yearly skin exams.  Encouraged to self examine twice annually.  Continue ultraviolet protection.  Inflamed seborrheic keratosis Right Lower Back  Destruction of lesion - Right Lower Back Complexity: simple   Destruction method: cryotherapy   Informed consent: discussed and consent obtained   Timeout:  patient name, date of birth, surgical site, and procedure verified Lesion destroyed using liquid nitrogen: Yes   Cryotherapy cycles:  3 Outcome: patient tolerated procedure well with no complications   Post-procedure details: wound care instructions given    Seborrheic keratosis Mid Back  No intervention necessary.      I, Lavonna Monarch, MD, have reviewed all documentation for this visit.  The documentation on 11/07/20 for the exam, diagnosis, procedures, and orders are all  accurate and complete.

## 2020-11-17 ENCOUNTER — Encounter: Payer: Self-pay | Admitting: Neurology

## 2020-11-21 NOTE — Progress Notes (Signed)
Assessment/Plan:   1.  Cervical Dystonia  -Symptoms today really look a little more dystonic than related to ET.  -I talked to the patient about the nature and pathophysiology.  The patient is having trouble with neck pain/head rotation.   The primary muscles involved are the R SCM, L [place].  We talked about treatments.  We talked about the value of botox.  The patient was educated on the botulinum toxin the black blox warning and given a copy of the botox patient medication guide.  The patient understands that this warning states that there have been reported cases of the Botox extending beyond the injection site and creating adverse effects, similar to those of botulism. This included loss of strength, trouble walking, hoarseness, trouble saying words clearly, loss of bladder control, trouble breathing, trouble swallowing, diplopia, blurry vision and ptosis. Most of the distant spread of Botox was happening in patients, primarily children, who received medication for spasticity or for cervical dystonia. The patient expressed understanding and desire to proceed.  I will try to get authorization.   Patient education/literature provided to the patient today.   Subjective:   Theresa Huff was seen in consultation in the movement disorder clinic at the request of Antony Contras, MD.  The evaluation is for tremor.  Patient was seen for the same in 2015 and in 2019.  Tremor has primarily been in the head, but really appeared more consistent with essential tremor than it did with cervical dystonia previously.  We did offer injections of Botox  with R SCM, L  but she wanted to hold off on that.  She was not really keen on the injections at the time.  She had asked about medications, and we had discussed that low-dose clonazepam could help, but she was already on Ativan and I told her that this were very similar and she would need to talk to the prescribing physician and see if this could be changed out.  She is  still on the lorazepam today.  She reports that tremor has gotten worse.  She notes that she has to hold her head sometimes.  Head usually shakes in the "no" direction.  Some tremor in the hands.   She states that she has some associated neck pain and wonders if it generates headaches.  No prior mri neck    Outside reports reviewed: historical medical records, office notes, and referral letter/letters.  Allergies  Allergen Reactions   Hydrocodone Nausea And Vomiting   Tramadol Hives and Nausea And Vomiting   Codeine Nausea And Vomiting   Iodine Hives   Radiaplexrx [Skin Protectants, Misc.] Other (See Comments)    Contraindicated on pharmacy profile   Sulfa Antibiotics Hives   Tape Other (See Comments)    Redness, skin tear   Primidone Other (See Comments)    Pt had first dose effect.  More of a known sensitivity and not true allergy    Current Outpatient Medications  Medication Instructions   betamethasone dipropionate 0.05 % cream Topical, Daily   Biotin 10 MG TABS Oral   cetirizine (ZYRTEC) 10 mg, Daily   DULoxetine (CYMBALTA) 60 MG capsule No dose, route, or frequency recorded.   fluticasone (FLONASE) 50 MCG/ACT nasal spray No dose, route, or frequency recorded.   letrozole (FEMARA) 2.5 mg, Oral, Daily   LORazepam (ATIVAN) 2 MG tablet TAKE 1 TABLET BY MOUTH AT BEDTIME AS NEEDED FOR INSOMNIA   meloxicam (MOBIC) 15 MG tablet TAKE 1 TABLET BY MOUTH EVERY DAY  omeprazole (PRILOSEC) 40 mg, Oral, As needed   SUMAtriptan (IMITREX) 50 MG tablet TAKE 1 TABLET BY MOUTH EVERY 2 HOURS AS NEEDED FOR MIGRAINE MAY REPEAT IN 2 HRS IF HEADACHE PERSISTS   tiZANidine (ZANAFLEX) 4 mg, Oral, 3 times daily PRN   Vitamin D (Ergocalciferol) (DRISDOL) 50,000 Units, Oral, Every 7 days     Objective:   VITALS:   Vitals:   11/25/20 0933  BP: 123/78  Pulse: 76  SpO2: 98%  Weight: 145 lb 9.6 oz (66 kg)  Height: 5\' 5"  (1.651 m)   Gen:  Appears stated age and in NAD. HEENT:  Normocephalic,  atraumatic. The mucous membranes are moist. The superficial temporal arteries are without ropiness or tenderness. Cardiovascular: Regular rate and rhythm. Lungs: Clear to auscultation bilaterally. Neck: There are no carotid bruits noted bilaterally.  Neck is turned slightly left and there is head tremor in the "no" direction.  Slight hypertrophy of right SCM.   NEUROLOGICAL:   Orientation:  The patient is alert and oriented x 3.  Recent and remote memory are intact.  Attention span and concentration are normal.  Able to name objects and repeat without trouble.  Fund of knowledge is appropriate Cranial nerves: There is good facial symmetry.  Extraocular muscles are intact and visual fields are full to confrontational testing. Speech is fluent and clear. Soft palate rises symmetrically and there is no tongue deviation. Hearing is intact to conversational tone. Tone: Tone is good throughout. Sensation: Sensation is intact to light touch to light touch throughout. Coordination:  The patient has no difficulty with RAM's or FNF bilaterally. Motor: Strength is 5/5 in the bilateral upper and lower extremities.  Shoulder shrug is equal bilaterally.  There is no pronator drift.  There are no fasciculations noted. DTR's: Deep tendon reflexes are 2/4 at the bilateral biceps, triceps, brachioradialis, patella and achilles.  Plantar responses are downgoing bilaterally. Gait and Station: The patient is able to ambulate without difficulty.  Movement: No rest tremor.  No significant postural or intention tremor.  I have reviewed and interpreted the following labs independently   Chemistry      Component Value Date/Time   NA 139 09/13/2016 0832   K 5.0 09/13/2016 0832   CL 101 07/15/2015 1745   CO2 26 09/13/2016 0832   BUN 15.4 09/13/2016 0832   CREATININE 0.9 09/13/2016 0832      Component Value Date/Time   CALCIUM 9.6 09/13/2016 0832   ALKPHOS 77 09/13/2016 0832   AST 14 09/13/2016 0832   ALT 16  09/13/2016 0832   BILITOT 0.57 09/13/2016 0832      Lab Results  Component Value Date   WBC 3.9 09/13/2016   HGB 12.9 09/13/2016   HCT 40.9 09/13/2016   MCV 94.5 09/13/2016   PLT 210 09/13/2016   Lab Results  Component Value Date   TSH 1.217 02/16/2016       CC:  Antony Contras, MD

## 2020-11-22 ENCOUNTER — Other Ambulatory Visit: Payer: Self-pay | Admitting: Podiatry

## 2020-11-25 ENCOUNTER — Telehealth: Payer: Self-pay

## 2020-11-25 ENCOUNTER — Encounter: Payer: Self-pay | Admitting: Neurology

## 2020-11-25 ENCOUNTER — Other Ambulatory Visit: Payer: Self-pay

## 2020-11-25 ENCOUNTER — Ambulatory Visit (INDEPENDENT_AMBULATORY_CARE_PROVIDER_SITE_OTHER): Payer: Medicare Other | Admitting: Neurology

## 2020-11-25 VITALS — BP 123/78 | HR 76 | Ht 65.0 in | Wt 145.6 lb

## 2020-11-25 DIAGNOSIS — G243 Spasmodic torticollis: Secondary | ICD-10-CM

## 2020-11-25 NOTE — Telephone Encounter (Signed)
New message    Your information has been sent to [facility].  Royston Cowper Key: [facility] [place] Case ID: [id number] Need help? Call us at [phone] Status Sent to [employer] Drug Botox 200UNIT solution Form [facility] Medicare Electronic Prior Authorization Request Form [phone] NCPDP)

## 2020-11-25 NOTE — Patient Instructions (Signed)
My office will get authorization for the botox and then we will call you with appointment dates/times for that.  The physicians and staff at [facility] are committed to providing excellent care. You may receive a survey requesting feedback about your experience at our office. We strive to receive "very good" responses to the survey questions. If you feel that your experience would prevent you from giving the office a "very good " response, please contact our office to try to remedy the situation. We may be reached at [phone]. Thank you for taking the time out of your busy day to complete the survey.

## 2020-11-26 NOTE — Telephone Encounter (Signed)
F/u   Royston Cowper Key: [facility] [place] Case ID: [id number] Need help? Call us at [phone]  Outcome Approved on November 1  Approved. This drug has been approved under the Member's Medicare Part D benefit. Approved quantity: 1 each per 30 day(s). You may fill up to a 90 day supply except for those on Specialty Tier 5, which can be filled up to a 30 day supply. Please call the pharmacy to process the prescription claim.  Drug Botox 200UNIT solution Form [facility] Medicare Electronic Prior Authorization Request Form [phone] NCPDP)

## 2020-11-26 NOTE — Telephone Encounter (Signed)
F/u   Benefit Verification [id number] Submitted! For BV Basic submissions, please allow 24 hours for results.  For BV Full submissions, please allow 24-48 hours for results.

## 2020-11-27 ENCOUNTER — Other Ambulatory Visit (HOSPITAL_COMMUNITY): Payer: Self-pay

## 2020-11-28 ENCOUNTER — Other Ambulatory Visit: Payer: Self-pay

## 2020-11-28 ENCOUNTER — Telehealth: Payer: Self-pay

## 2020-11-28 ENCOUNTER — Other Ambulatory Visit (HOSPITAL_COMMUNITY): Payer: Self-pay

## 2020-11-28 DIAGNOSIS — G243 Spasmodic torticollis: Secondary | ICD-10-CM

## 2020-11-28 MED ORDER — XEOMIN 200 UNITS IM SOLR
1.0000 | Freq: Once | INTRAMUSCULAR | 0 refills | Status: AC
  Filled 2020-11-28: qty 1, 120d supply, fill #0

## 2020-11-28 NOTE — Telephone Encounter (Signed)
New message   Zamara Cozad Key: [id number] [place] Case ID: [id number] Need help? Call us at [phone] Status Sent to [place] 200UNIT solution Form [facility] Medicare Electronic Prior Authorization Request Form [phone] NCPDP)

## 2020-11-28 NOTE — Telephone Encounter (Signed)
F/U   Royston Cowper Key: [id number] - [place] Case ID: [id number] Need help? Call us at [phone] Outcome Approvedtoday Approved. This drug has been approved under the Member's Medicare Part D benefit. Approved quantity: 1 vial per 30 day(s). You may fill up to a 90 day supply except for those on Specialty Tier 5, which can be filled up to a 30 day supply. Please call the pharmacy to process the prescription claim. Drug Xeomin 200UNIT solution Form [facility] Medicare Electronic Prior Authorization Request Form [phone] NCP

## 2020-11-29 ENCOUNTER — Other Ambulatory Visit (HOSPITAL_COMMUNITY): Payer: Self-pay

## 2020-12-01 ENCOUNTER — Other Ambulatory Visit (HOSPITAL_COMMUNITY): Payer: Self-pay

## 2020-12-02 ENCOUNTER — Other Ambulatory Visit (HOSPITAL_COMMUNITY): Payer: Self-pay

## 2020-12-03 ENCOUNTER — Other Ambulatory Visit (HOSPITAL_COMMUNITY): Payer: Self-pay

## 2020-12-03 ENCOUNTER — Telehealth: Payer: Self-pay

## 2020-12-03 NOTE — Telephone Encounter (Signed)
New message   I contacted [place] for consent and was advised the dropship from the company to [employer] will be on  12/03/20.  I contacted [employer] this morning advise me to call back this afternoon it appears to be coming today.

## 2020-12-04 ENCOUNTER — Other Ambulatory Visit (HOSPITAL_COMMUNITY): Payer: Self-pay

## 2020-12-05 ENCOUNTER — Ambulatory Visit (INDEPENDENT_AMBULATORY_CARE_PROVIDER_SITE_OTHER): Payer: Medicare Other | Admitting: Neurology

## 2020-12-05 ENCOUNTER — Other Ambulatory Visit: Payer: Self-pay

## 2020-12-05 ENCOUNTER — Ambulatory Visit: Payer: Medicare Other | Admitting: Neurology

## 2020-12-05 ENCOUNTER — Telehealth: Payer: Self-pay | Admitting: Medical Oncology

## 2020-12-05 DIAGNOSIS — G243 Spasmodic torticollis: Secondary | ICD-10-CM

## 2020-12-05 MED ORDER — INCOBOTULINUMTOXINA 100 UNITS IM SOLR
200.0000 [IU] | INTRAMUSCULAR | Status: DC
Start: 2020-12-05 — End: 2021-02-10
  Administered 2020-12-05: 200 [IU] via INTRAMUSCULAR

## 2020-12-05 NOTE — Procedures (Signed)
[facility]   Procedure Note Botox  Attending: Dr. Wells Guiles Kenia Teagarden  Preoperative Diagnosis(es): Cervical Dystonia  Result History  N/a  Consent obtained from: The patient Benefits discussed included, but were not limited to decreased muscle tightness, increased joint range of motion, and decreased pain.  Risk discussed included, but were not limited pain and discomfort, bleeding, bruising, excessive weakness, venous thrombosis, muscle atrophy and dysphagia.  A copy of the patient medication guide was given to the patient which explains the blackbox warning.  Patients identity and treatment sites confirmed Yes.  .  Details of Procedure: Skin was cleaned with alcohol.  A 30 gauge, 83mm  needle was introduced to the target muscle, except for posterior splenius where 27 gauge, 1.5 inch needle used.   Prior to injection, the needle plunger was aspirated to make sure the needle was not within a blood vessel.  There was no blood retrieved on aspiration.    Following is a summary of the muscles injected  And the amount of Botulinum toxin used:   Dilution 0.9% preservative free saline mixed with 100 u Botox type A to make 10 U per 0.1cc  Injections  Location Left  Right Units Number of sites        Sternocleidomastoid  50 50 1  Splenius Capitus, posterior approach 70  70 1  Splenius Capitus, lateral approach 30  30 1   Levator Scapulae      Trapezius 20/10 20 50         TOTAL UNITS:   200    Agent: Xeomin.  2 vials of Xeomin were used, each containing 100 units and freshly diluted with 1 mL of sterile, non-preserved saline   Total injected (Units): 200  Total wasted (Units): none wasted   Pt tolerated procedure well without complications.   Reinjection is anticipated in 3 months.

## 2020-12-05 NOTE — Telephone Encounter (Signed)
[name]: Follow-up Outgoing call: LVMOM  Message left for patient to return call. Informed patient follow-up call for study and have a few follow-up questions for her at this time point. Asked patient to return call. Return number provided.  Patient thanked.  Maxwell Marion, RN, BSN, [place] Clinical Research Nurse Lead 12/05/2020 2:43 PM

## 2020-12-09 ENCOUNTER — Telehealth: Payer: Self-pay | Admitting: Medical Oncology

## 2020-12-09 NOTE — Telephone Encounter (Signed)
[name]: Follow-up Outgoing call 2nd attempt: [place] left for patient to return call. Informed patient follow-up call for study and have a few follow-up questions for her at this time point. Asked patient to return call. Return number provided.  Patient thanked.  Maxwell Marion, RN, BSN, [place] Clinical Research Nurse Lead 12/09/2020 4:13 PM

## 2020-12-11 ENCOUNTER — Telehealth: Payer: Self-pay | Admitting: Medical Oncology

## 2020-12-11 NOTE — Telephone Encounter (Signed)
[name]: Follow-Up II 18  Patient returned call.  Patient confirms to be doing well. Patient with no SAE's and confirms taking daily Letrozole as prescribed. I asked patient if she had any questions and patient denied.  Patient was thanked for her time and her continued support of study and was encouraged to call me or Dr. Lindi Adie with any questions or concerns she may have.  Patient confirms no new anti-cancer medications prescribed.  Patient had breast exam in clinic 06/09/2020,  see MD notes. Mammogram 03/07/2020. Patient schedule to return for an in-clinic visit in May of 2023 Maxwell Marion, RN, BSN, [facility] Clinical Research 12/11/2020 2:58 PM

## 2021-01-09 ENCOUNTER — Ambulatory Visit: Payer: Medicare Other | Admitting: Neurology

## 2021-01-14 ENCOUNTER — Encounter: Payer: Self-pay | Admitting: Hematology and Oncology

## 2021-02-09 NOTE — Progress Notes (Signed)
Assessment/Plan:   1.  Cervical Dystonia  -First injections November 11 with Xeomin, with primary muscles being right sternocleidomastoid and left splenius capitis.  She does need a slightly higher dose of medication and we will try to see if insurance will approve that.    Subjective:   Theresa Huff was seen in follow-up for cervical dystonia.  She had her first Botox on November 11.  Her insurance required use of Xeomin.  She reports that the xeomin was helpful but she is still having trouble moving the head to the left.  She reports 98% effectiveness but still trouble with head moving to the left.  Head tremor is markedly better.  Her co-pay is still very high even for the Xeomin.    Outside reports reviewed: historical medical records, office notes, and referral letter/letters.  Allergies  Allergen Reactions   Hydrocodone Nausea And Vomiting   Tramadol Hives and Nausea And Vomiting   Codeine Nausea And Vomiting   Iodine Hives   Radiaplexrx [Skin Protectants, Misc.] Other (See Comments)    Contraindicated on pharmacy profile   Sulfa Antibiotics Hives   Tape Other (See Comments)    Redness, skin tear   Primidone Other (See Comments)    Pt had first dose effect.  More of a known sensitivity and not true allergy    Current Outpatient Medications  Medication Instructions   betamethasone dipropionate 0.05 % cream Topical, Daily   Biotin 10 MG TABS Oral   cetirizine (ZYRTEC) 10 mg, Daily   DULoxetine (CYMBALTA) 60 MG capsule No dose, route, or frequency recorded.   fluticasone (FLONASE) 50 MCG/ACT nasal spray No dose, route, or frequency recorded.   incobotulinumtoxinA (XEOMIN) 100 units SOLR injection Inject 300 u per physician as directed   letrozole (FEMARA) 2.5 mg, Oral, Daily   LORazepam (ATIVAN) 2 MG tablet TAKE 1 TABLET BY MOUTH AT BEDTIME AS NEEDED FOR INSOMNIA   meloxicam (MOBIC) 15 MG tablet TAKE 1 TABLET BY MOUTH EVERY DAY   omeprazole (PRILOSEC) 40 mg, Oral, As  needed   SUMAtriptan (IMITREX) 50 MG tablet TAKE 1 TABLET BY MOUTH EVERY 2 HOURS AS NEEDED FOR MIGRAINE MAY REPEAT IN 2 HRS IF HEADACHE PERSISTS   tiZANidine (ZANAFLEX) 4 mg, Oral, 3 times daily PRN   Vitamin D (Ergocalciferol) (DRISDOL) 50,000 Units, Oral, Every 7 days     Objective:   VITALS:   Vitals:   02/10/21 0833  BP: 124/78  Pulse: 73  SpO2: 98%  Weight: 150 lb 9.6 oz (68.3 kg)  Height: 5\' 5"  (1.651 m)    Gen:  Appears stated age and in NAD. HEENT:  Normocephalic, atraumatic. The mucous membranes are moist. T Neck: There are no carotid bruits noted bilaterally.  Neck is turned slightly left there is marked improvement in the head tremor.  She has just a slight head tremor in the "no" direction.   NEUROLOGICAL:   Orientation:  The patient is alert and oriented x 3.  Recent and remote memory are intact.  Attention span and concentration are normal.  Able to name objects and repeat without trouble.  Fund of knowledge is appropriate Cranial nerves: There is good facial symmetry.    I have reviewed and interpreted the following labs independently   Chemistry      Component Value Date/Time   NA 139 09/13/2016 0832   K 5.0 09/13/2016 0832   CL 101 07/15/2015 1745   CO2 26 09/13/2016 0832   BUN 15.4 09/13/2016 6606  CREATININE 0.9 09/13/2016 0832      Component Value Date/Time   CALCIUM 9.6 09/13/2016 0832   ALKPHOS 77 09/13/2016 0832   AST 14 09/13/2016 0832   ALT 16 09/13/2016 0832   BILITOT 0.57 09/13/2016 0832      Lab Results  Component Value Date   WBC 3.9 09/13/2016   HGB 12.9 09/13/2016   HCT 40.9 09/13/2016   MCV 94.5 09/13/2016   PLT 210 09/13/2016   Lab Results  Component Value Date   TSH 1.217 02/16/2016       CC:  Antony Contras, MD

## 2021-02-10 ENCOUNTER — Ambulatory Visit (INDEPENDENT_AMBULATORY_CARE_PROVIDER_SITE_OTHER): Payer: Medicare Other | Admitting: Neurology

## 2021-02-10 ENCOUNTER — Telehealth: Payer: Self-pay

## 2021-02-10 ENCOUNTER — Other Ambulatory Visit (HOSPITAL_COMMUNITY): Payer: Self-pay

## 2021-02-10 ENCOUNTER — Other Ambulatory Visit: Payer: Self-pay

## 2021-02-10 ENCOUNTER — Encounter: Payer: Self-pay | Admitting: Neurology

## 2021-02-10 VITALS — BP 124/78 | HR 73 | Ht 65.0 in | Wt 150.6 lb

## 2021-02-10 DIAGNOSIS — G243 Spasmodic torticollis: Secondary | ICD-10-CM | POA: Diagnosis not present

## 2021-02-10 MED ORDER — XEOMIN 100 UNITS IM SOLR: INTRAMUSCULAR | 0 refills | Status: DC

## 2021-02-10 MED ORDER — XEOMIN 100 UNITS IM SOLR
INTRAMUSCULAR | 0 refills | Status: DC
  Filled 2021-02-10: qty 3, fill #0
  Filled 2021-03-25: qty 3, 30d supply, fill #0

## 2021-02-10 NOTE — Telephone Encounter (Signed)
New message   [place] Key: [id number] help? Call us at [phone]  Outcome Additional Information Required  Prior Authorization Not Required  Drug Xeomin 100UNIT solution Quantity 3  Form [facility] Medicare Electronic Prior Authorization Request Form [phone] NCPDP)

## 2021-02-12 ENCOUNTER — Ambulatory Visit: Payer: Medicare Other | Admitting: Neurology

## 2021-03-10 ENCOUNTER — Other Ambulatory Visit: Payer: Self-pay | Admitting: Podiatry

## 2021-03-17 ENCOUNTER — Other Ambulatory Visit (HOSPITAL_COMMUNITY): Payer: Self-pay

## 2021-03-25 ENCOUNTER — Other Ambulatory Visit (HOSPITAL_COMMUNITY): Payer: Self-pay

## 2021-03-27 ENCOUNTER — Other Ambulatory Visit (HOSPITAL_COMMUNITY): Payer: Self-pay

## 2021-04-10 ENCOUNTER — Ambulatory Visit (INDEPENDENT_AMBULATORY_CARE_PROVIDER_SITE_OTHER): Payer: Medicare Other | Admitting: Neurology

## 2021-04-10 ENCOUNTER — Other Ambulatory Visit: Payer: Self-pay

## 2021-04-10 DIAGNOSIS — G243 Spasmodic torticollis: Secondary | ICD-10-CM | POA: Diagnosis not present

## 2021-04-10 NOTE — Procedures (Signed)
Botulinum Clinic  ? ?Procedure Note Botox ? ?Attending: Dr. Wells Guiles Skylynn Burkley ? ?Preoperative Diagnosis(es): Cervical Dystonia ? ?Result History  ?Helped but not enough.  Dosage increased this time.  No SE with last injections ? ?Consent obtained from: The patient ?Benefits discussed included, but were not limited to decreased muscle tightness, increased joint range of motion, and decreased pain.  Risk discussed included, but were not limited pain and discomfort, bleeding, bruising, excessive weakness, venous thrombosis, muscle atrophy and dysphagia.  A copy of the patient medication guide was given to the patient which explains the blackbox warning. ? ?Patients identity and treatment sites confirmed Yes.  . ? ?Details of Procedure: ?Skin was cleaned with alcohol.  A 30 gauge, 36m  needle was introduced to the target muscle, except for posterior splenius where 27 gauge, 1.5 inch needle used.   Prior to injection, the needle plunger was aspirated to make sure the needle was not within a blood vessel.  There was no blood retrieved on aspiration.   ? ?Following is a summary of the muscles injected  And the amount of Botulinum toxin used: ? ? ?Dilution ?0.9% preservative free saline mixed with 100 u Botox type A to make 10 U per 0.1cc ? ?Injections  ?Location Left  Right Units Number of sites  ?      ?Sternocleidomastoid  60/10 70 2  ?Splenius Capitus, posterior approach 100  100 1  ?Splenius Capitus, lateral approach '30  30 1  '$ ?Levator Scapulae      ?Trapezius 20/10 20 50   ?      ?TOTAL UNITS:   250   ? ?Agent: Xeomin.  3 vials of Xeomin were used, each containing 100 units and freshly diluted with 1 mL of sterile, non-preserved saline ? ? Total injected (Units): 250 ? Total wasted (Units): 50 waste ? ? ?Pt tolerated procedure well without complications.   ?Reinjection is anticipated in 3 months. ? ?

## 2021-04-16 ENCOUNTER — Ambulatory Visit (INDEPENDENT_AMBULATORY_CARE_PROVIDER_SITE_OTHER): Payer: Medicare Other | Admitting: Podiatry

## 2021-04-16 ENCOUNTER — Other Ambulatory Visit: Payer: Self-pay

## 2021-04-16 ENCOUNTER — Ambulatory Visit (INDEPENDENT_AMBULATORY_CARE_PROVIDER_SITE_OTHER): Payer: Medicare Other

## 2021-04-16 DIAGNOSIS — S86312D Strain of muscle(s) and tendon(s) of peroneal muscle group at lower leg level, left leg, subsequent encounter: Secondary | ICD-10-CM

## 2021-04-16 DIAGNOSIS — M7672 Peroneal tendinitis, left leg: Secondary | ICD-10-CM

## 2021-04-19 ENCOUNTER — Encounter: Payer: Self-pay | Admitting: Podiatry

## 2021-04-19 NOTE — Progress Notes (Signed)
?  Subjective:  ?[name], female    DOB: 1950/10/01,  MRN: [id number] ? ?Chief Complaint  ?Patient presents with  ? Tendonitis  ?    Pain on L side of Left foot  ? ? ?DOS: 12/07/2019 ?Procedure: Left ankle arthroscopy with debridement, tibial ostectomy, lateral ankle stabilization, peroneal tendon repair ? ?71 y.o. female returns for follow-up overall the ankle feels much better than it prior to her procedure, she has worsening pain for the last month and a half on the outside edge of the foot its in a different area than it was previously ? ?Review of Systems: Negative except as noted in the HPI. Denies N/V/F/Ch. ? ? ?Objective:  ?There were no vitals filed for this visit. ?There is no height or weight on file to calculate BMI. ?Constitutional Well developed. ?Well nourished.  ?Vascular Foot warm and well perfused. ?Capillary refill normal to all digits.  Negative Homans.  No calf pain on palpation  ?Neurologic Normal speech. ?Oriented to person, place, and time. ?Epicritic sensation to light touch grossly present bilaterally.  ?Dermatologic  incisions are well-healed  ?Orthopedic: Pain at the peroneal sulcus, none under the incision or in the proximal areas of the peroneal tendons around the malleolus, she has good 5 out of 5 strength  ? ?Radiographs: No acute osseous abnormalities noted at the fifth met base or peroneal sulcus ?Assessment:  ? ?1. Peroneal tendinitis of left lower extremity   ? ? ?Plan:  ?Patient was evaluated and treated and all questions answered. ? ?S/p foot and ankle surgery left ?-We reviewed her left foot radiographs.  Discussed that she has some recurrence of peroneal tendinitis although it is much more distal than it was previously.  Her surgical repair and site is doing quite well.  We discussed treatment of this including physical therapy, corticosteroid injection or plate rich plasma injection.  I discussed with her if we do a corticosteroid injection I would require  her to be immobilized in a short CAM boot.  She does not have CAM boot she previously had it is defective and the straps no longer functioning correctly so she had to get rid of it.  A new CAM boot was dispensed today to support the ankle and immobilizing the peroneal tendons following a corticosteroid injection of 4 mg of dexamethasone phosphate and 0.5% Marcaine plain.  She tolerated this well and I will see her back in 5 weeks.  She will begin home therapy exercises which I gave her as well. ? ?Return in about 5 weeks (around 05/21/2021).  ?

## 2021-04-24 ENCOUNTER — Other Ambulatory Visit (HOSPITAL_COMMUNITY): Payer: Self-pay

## 2021-05-20 ENCOUNTER — Telehealth: Payer: Self-pay | Admitting: Neurology

## 2021-05-20 NOTE — Progress Notes (Signed)
? ?Virtual Visit Via Video  ? ?The purpose of this virtual visit is to provide medical care while limiting exposure to the novel coronavirus.   ? ?Consent was obtained for video visit:  Yes.   ?Answered questions that patient had about telehealth interaction:  Yes.   ?I discussed the limitations, risks, security and privacy concerns of performing an evaluation and management service by telemedicine. I also discussed with the patient that there may be a patient responsible charge related to this service. The patient expressed understanding and agreed to proceed. ? ?Pt location: Home ?Physician Location: office ?Name of referring provider:  Antony Contras, MD ?I connected with Theresa Huff at patients initiation/request on 05/22/2021 at  8:15 AM EDT by video enabled telemedicine application and verified that I am speaking with the correct person using two identifiers. ?Pt MRN:  [id number] ?Pt DOB:  [birth year] ?Video Participants:  Theresa Huff;   ? ? ?Assessment/Plan:  ? ?1.  Cervical Dystonia ? -Patient is doing markedly better than prior to injections with Xeomin.   ?-Patient still having some trouble with turning head to the left.  Head tremor is markedly better than prior to Xeomin.   ?-Discussed what she wanted to do.  She was pretty happy with 200 units of Xeomin (reported 98% better).  We could go back down to that dose, which would save her some cost.  However, she is still having some trouble turning her head to the left even with 250 units.  I told her we could raise up the dose to 300 units and see how she did.  We could also change to onobotulinum toxin, but her co-pay was quite extensive (insurance had quoted $1600).  Ultimately, she decided to go up to the 300 units and see if it would help the slight head tremor she has left and help with turning head to the left. ? ? ? ?Subjective:  ? ?Theresa Huff was seen in follow-up for cervical dystonia.  Patient's last Xeomin was March 17.  I did increase  it.  She was previously pretty happy with the efficacy, but was having trouble turning her head to the left still some.  she reports today that the increased dose helped some, but she still notes some tremulousness of the head and some mild difficulty turning the head to the left.  She has no side effects.  No trouble swallowing. ? ? ? ?Outside reports reviewed: historical medical records, office notes, and referral letter/letters. ? ?Allergies  ?Allergen Reactions  ? Hydrocodone Nausea And Vomiting  ? Tramadol Hives and Nausea And Vomiting  ? Codeine Nausea And Vomiting  ? Iodine Hives  ? Radiaplexrx [Skin Protectants, Misc.] Other (See Comments)  ?  Contraindicated on pharmacy profile  ? Sulfa Antibiotics Hives  ? Tape Other (See Comments)  ?  Redness, skin tear  ? Primidone Other (See Comments)  ?  Pt had first dose effect.  More of a known sensitivity and not true allergy  ? ? ?Current Outpatient Medications  ?Medication Instructions  ? betamethasone dipropionate 0.05 % cream Topical, Daily  ? Biotin 10 MG TABS Oral  ? cetirizine (ZYRTEC) 10 mg, Daily  ? DULoxetine (CYMBALTA) 60 MG capsule No dose, route, or frequency recorded.  ? fluticasone (FLONASE) 50 MCG/ACT nasal spray No dose, route, or frequency recorded.  ? incobotulinumtoxinA (XEOMIN) 100 units SOLR injection Inject 300 u per physician as directed  ? letrozole (FEMARA) 2.5 mg, Oral, Daily  ? LORazepam (ATIVAN)  2 MG tablet TAKE 1 TABLET BY MOUTH AT BEDTIME AS NEEDED FOR INSOMNIA  ? meloxicam (MOBIC) 15 MG tablet TAKE 1 TABLET BY MOUTH EVERY DAY  ? omeprazole (PRILOSEC) 40 mg, Oral, As needed  ? SUMAtriptan (IMITREX) 50 MG tablet TAKE 1 TABLET BY MOUTH EVERY 2 HOURS AS NEEDED FOR MIGRAINE MAY REPEAT IN 2 HRS IF HEADACHE PERSISTS  ? tiZANidine (ZANAFLEX) 4 mg, Oral, 3 times daily PRN  ? Vitamin D (Ergocalciferol) (DRISDOL) 50,000 Units, Oral, Every 7 days  ? ? ? ?Objective:  ? ?VITALS:   ?There were no vitals filed for this visit. ? ? ?Gen:  Appears  stated age and in NAD. ?HEENT: Normocephalic, atraumatic.  Mucous membranes are moist. ?Neck: There are no carotid bruits noted bilaterally.  Did not see the neck turn to the left today, but video is certainly not as good as in person.  Very little head tremor is noted in the "no" direction.  ?  ?NEUROLOGICAL: ?  ?Orientation:  The patient is alert and oriented x 3.  Recent and remote memory are intact.  Attention span and concentration are normal.  Able to name objects and repeat without trouble.  Fund of knowledge is appropriate ?Cranial nerves: There is good facial symmetry.   ? I have reviewed and interpreted the following labs independently ?  Chemistry   ?   ?Component Value Date/Time  ? NA 139 09/13/2016 0832  ? K 5.0 09/13/2016 0832  ? CL 101 07/15/2015 1745  ? CO2 26 09/13/2016 0832  ? BUN 15.4 09/13/2016 0832  ? CREATININE 0.9 09/13/2016 0832  ?    ?Component Value Date/Time  ? CALCIUM 9.6 09/13/2016 0832  ? ALKPHOS 77 09/13/2016 0832  ? AST 14 09/13/2016 0832  ? ALT 16 09/13/2016 0832  ? BILITOT 0.57 09/13/2016 0037  ?  ? ? ?Lab Results  ?Component Value Date  ? WBC 3.9 09/13/2016  ? HGB 12.9 09/13/2016  ? HCT 40.9 09/13/2016  ? MCV 94.5 09/13/2016  ? PLT 210 09/13/2016  ? ?Lab Results  ?Component Value Date  ? TSH 1.217 02/16/2016  ? ? ?  ?Follow up Instructions  ? ?  ? -I discussed the assessment and treatment plan with the patient. The patient was provided an opportunity to ask questions and all were answered. The patient agreed with the plan and demonstrated an understanding of the instructions. ?  ?The patient was advised to call back or seek an in-person evaluation if the symptoms worsen or if the condition fails to improve as anticipated. ? ?Total time spent on today's visit was 20 minutes, including both face-to-face time and nonface-to-face time.  Time included that spent on review of records (prior notes available to me/labs/imaging if pertinent), discussing treatment and goals, answering  patient's questions and coordinating care. ? ? ? ?Alonza Bogus, DO ? ? ?CC:  Antony Contras, MD ? ? ?

## 2021-05-20 NOTE — Telephone Encounter (Signed)
Pt called in and left a message. She was returning Chelsea's call ?

## 2021-05-20 NOTE — Telephone Encounter (Signed)
Called patient and left voicemail message.

## 2021-05-20 NOTE — Telephone Encounter (Signed)
-----   Message from [place], DO sent at 04/10/2021  8:46 AM EDT ----- ?Find out how she did with the increased dosage of botox (xeomin) last visit. ? ?

## 2021-05-20 NOTE — Telephone Encounter (Signed)
Patient did not notice any change from the increase in Xeomin. She did notice some shaking recently but generally no change  ?

## 2021-05-21 ENCOUNTER — Ambulatory Visit (INDEPENDENT_AMBULATORY_CARE_PROVIDER_SITE_OTHER): Payer: Medicare Other | Admitting: Podiatry

## 2021-05-21 DIAGNOSIS — M7672 Peroneal tendinitis, left leg: Secondary | ICD-10-CM

## 2021-05-22 ENCOUNTER — Telehealth (INDEPENDENT_AMBULATORY_CARE_PROVIDER_SITE_OTHER): Payer: Medicare Other | Admitting: Neurology

## 2021-05-22 ENCOUNTER — Encounter: Payer: Self-pay | Admitting: Neurology

## 2021-05-22 DIAGNOSIS — G243 Spasmodic torticollis: Secondary | ICD-10-CM | POA: Diagnosis not present

## 2021-05-24 NOTE — Progress Notes (Signed)
?  Subjective:  ?[name], female    DOB: [birth year],  MRN: [id number] ? ?Chief Complaint  ?Patient presents with  ? Tendonitis  ?   Pain on L side of Left foot  ? ? ?DOS: 12/07/2019 ?Procedure: Left ankle arthroscopy with debridement, tibial ostectomy, lateral ankle stabilization, peroneal tendon repair ? ?71 y.o. female returns for follow-up doing much better she been wearing the boot the injection has helped quite a bit ? ?Review of Systems: Negative except as noted in the HPI. Denies N/V/F/Ch. ? ? ?Objective:  ?There were no vitals filed for this visit. ?There is no height or weight on file to calculate BMI. ?Constitutional Well developed. ?Well nourished.  ?Vascular Foot warm and well perfused. ?Capillary refill normal to all digits.  Negative Homans.  No calf pain on palpation  ?Neurologic Normal speech. ?Oriented to person, place, and time. ?Epicritic sensation to light touch grossly present bilaterally.  ?Dermatologic  incisions are well-healed  ?Orthopedic: Doing well and no longer has any pain she has good 5 out of 5 strength  ? ?Radiographs: No acute osseous abnormalities noted at the fifth met base or peroneal sulcus ?Assessment:  ? ?1. Peroneal tendinitis of left lower extremity   ? ? ? ?Plan:  ?Patient was evaluated and treated and all questions answered. ? ?S/p foot and ankle surgery left ?-Overall doing much better she can transition out of the boot and continue her home therapy exercises.  Advised to continue this until she is pain-free for at least 2 weeks.  I will see her back as needed. ? ?Return if symptoms worsen or fail to improve.  ?

## 2021-05-26 NOTE — Progress Notes (Signed)
? ?Patient Care Team: ?Antony Contras, MD as PCP - General (Family Medicine) ?Elsie Stain, MD (Pulmonary Disease) ?Rolm Bookbinder, MD as Consulting Physician (General Surgery) ?Nicholas Lose, MD as Consulting Physician (Hematology and Oncology) ?Rockwell Germany, RN as Registered Nurse ?Mauro Kaufmann, RN as Registered Nurse ?Gardenia Phlegm, NP as Nurse Practitioner (Hematology and Oncology) ?Maxwell Marion, RN as Equities trader (Medical Oncology) ?Lavonna Monarch, MD as Consulting Physician (Dermatology) ? ?DIAGNOSIS:  ?Encounter Diagnosis  ?Name Primary?  ? Malignant neoplasm of upper-outer quadrant of left breast in female, estrogen receptor positive ([place])   ? ? ?SUMMARY OF ONCOLOGIC HISTORY: ?Oncology History  ?Breast cancer of upper-outer quadrant of left female breast ([place])  ?01/30/2014 Mammogram  ? Left breast: 2.4 cm irregular high density mass with indistinct margin, middle depth, 6.8 cm from nipple, with architectural distortion. There is also a 3.5 cm irregular high density asymmetry at 1:00, anterior depth, with arch distortion and thickening. ? ?  ?01/30/2014 Breast US  ? Left breast: 3.7 cm irregular mass with indistinct margin at 2:00, posterior deth, 4 cm from nipple, hypoechoic. 10 x 1 cm irregular mass with lobulated and indistinct margin at 3:00, posterior depth. A third 0.9 cm lobulated mass at 1:00, posterior depth ? ?  ?02/04/2014 Initial Biopsy  ? Left breast core needle bx x 3: Invasive ductal carcinoma with DCIS ER 100%, PR 100%, HER-2 negative, Ki-67 ranged from 19-26%; third biopsy showed invasive mammary cancer with lobular features and intraductal papilloma, ER/PR positive HER-2 negative ? ?  ?02/11/2014 Breast MRI  ? Left breast: 3 masses that are confluent measuring 5.6 x 2.7 x 3 cm extending to the left aspect of the areola, appearing to involve the skin of theareola, deviating the nipple towards the left.  No axillary findings.  Neg rightt breast. ? ?  ?02/13/2014  Clinical Stage  ? Stage IIB: T3 N0 ? ?  ?03/07/2014 Definitive Surgery  ? Left mastectomy/SLNB: invasive ductal carcinoma grade 1; 4.8 cm with low-grade DCIS; margins negative 1/4 lymph nodes positive, ER 100% PR 100% HER-2 negative ratio 1.18 Ki-67 26% and 19%  ? ?  ?03/07/2014 Pathologic Stage  ? Stage IIB (T2 N1) ? ?  ?04/03/2014 - 08/13/2014 Chemotherapy  ? Adjuvant chemotherapy with dose dense adriamycin and cytoxan followed by weekly abraxane ?12 ? ?  ?09/09/2014 - 10/17/2014 Radiation Therapy  ? Adjuvant RT [name]): Left chest wall 50.4 Gy over 28 fractions. Left supraclavicular fossa and axilla: 45 Gy over 25 fractions ? ?  ?10/27/2014 -  Anti-estrogen oral therapy  ? Anastrozole 1 mg daily plus Ibrance ([name] Clinical Trial stopped 06/11/16) ?  ?01/16/2015 Survivorship  ? Survivorship visit completed and copy of care plan provided to patient. ? ?  ?01/30/2018 Genetic Testing  ? 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  ?  ?A variant of uncertain significance (VUS) in a gene called MLH1 was also noted. c.622C>T (p.Pro208Ser) ?  ?The Multi-Cancer Panel offered by [facility] includes sequencing and/or deletion duplication testing of the following 90 genes: AIP, ALK, APC, ATM, AXIN2, BAP1, BARD1, BLM, BMPR1A, BRCA1, BRCA2, BRIP1, BUB1B, CASR, CDC73, CDH1, CDK4, CDKN1B, CDKN1C, CDKN2A, CEBPA, CHEK2, CTNNA1, DICER1, DIS3L2, EGFR, ENG, EPCAM, FH, FLCN, GALNT12, GATA2, GPC3, GREM1, HOXB13, HRAS, KIT, MAX, MEN1, MET, MITF, MLH1, MLH3, MSH2, MSH3, MSH6, MUTYH, NBN, NF1, NF2, NTHL1, PALB2, PDGFRA, PHOX2B, PMS2, POLD1, POLE, POT1, PRKAR1A, PTCH1, PTEN, RAD50, RAD51C, RAD51D, RB1, RECQL4, RET, RNF43, RPS20, RUNX1, SDHA,  SDHAF2, SDHB, SDHC, SDHD, SMAD4, SMARCA4, SMARCB1, SMARCE1, STK11, SUFU, TERC, TERT, TMEM127, TP53, TSC1, TSC2, VHL, WRN, WT1 ? ?  ?Melanoma of skin ([place])  ?12/27/2017 Initial Diagnosis  ? Melanoma of skin ([place]) ? ?  ? ? ?CHIEF COMPLIANT: Follow-up  of left breast cancer on letrozole therapy ?  ? ?INTERVAL HISTORY: Theresa Huff is a  71 y.o. with above-mentioned history of left breast cancer treated with left mastectomy, adjuvant chemotherapy, radiation, and is currently on antiestrogen therapy with letrozole. She presents to the clinic today for a follow-up.  She is tolerating letrozole extremely well without any problems or concerns. ? ?ALLERGIES:  is allergic to hydrocodone; tramadol; codeine; iodine; radiaplexrx [skin protectants, misc.]; sulfa antibiotics; tape; and primidone. ? ?MEDICATIONS:  ?Current Outpatient Medications  ?Medication Sig Dispense Refill  ? betamethasone dipropionate 0.05 % cream Apply topically daily. 45 g 1  ? Biotin 10 MG TABS Take by mouth.    ? cetirizine (ZYRTEC) 10 MG tablet Take 10 mg by mouth daily.      ? DULoxetine (CYMBALTA) 60 MG capsule     ? fluticasone (FLONASE) 50 MCG/ACT nasal spray     ? incobotulinumtoxinA (XEOMIN) 100 units SOLR injection Inject 300 u per physician as directed 3 each 0  ? letrozole (FEMARA) 2.5 MG tablet Take 1 tablet (2.5 mg total) by mouth daily. 90 tablet 3  ? LORazepam (ATIVAN) 2 MG tablet TAKE 1 TABLET BY MOUTH AT BEDTIME AS NEEDED FOR INSOMNIA  0  ? meloxicam (MOBIC) 15 MG tablet TAKE 1 TABLET BY MOUTH EVERY DAY 30 tablet 3  ? omeprazole (PRILOSEC) 40 MG capsule Take 1 capsule (40 mg total) by mouth as needed.  0  ? SUMAtriptan (IMITREX) 50 MG tablet TAKE 1 TABLET BY MOUTH EVERY 2 HOURS AS NEEDED FOR MIGRAINE MAY REPEAT IN 2 HRS IF HEADACHE PERSISTS 9 tablet 1  ? tiZANidine (ZANAFLEX) 4 MG tablet Take 1 tablet (4 mg total) by mouth 3 (three) times daily as needed. (Patient taking differently: Take 4 mg by mouth 3 (three) times daily as needed. Pts 1/2 at hs) 40 tablet 1  ? Vitamin D, Ergocalciferol, (DRISDOL) 50000 UNITS CAPS capsule Take 50,000 Units by mouth every 7 (seven) days.    ? ?No current facility-administered medications for this visit.  ? ? ?PHYSICAL EXAMINATION: ?ECOG  PERFORMANCE STATUS: 1 - Symptomatic but completely ambulatory ? ?Vitals:  ? 06/09/21 0854  ?BP: 133/79  ?Pulse: 76  ?Resp: 18  ?Temp: 97.8 ?F (36.6 ?C)  ?SpO2: 98%  ? ?Filed Weights  ? 06/09/21 0854  ?Weight: 151 lb 1.6 oz (68.5 kg)  ? ? ?BREAST: No palpable masses or nodules in either right or left breasts. No palpable axillary supraclavicular or infraclavicular adenopathy no breast tenderness or nipple discharge. (exam performed in the presence of a chaperone) ? ?LABORATORY DATA:  ?I have reviewed the data as listed ? ?  Latest Ref Rng & Units 09/13/2016  ?  8:32 AM 06/09/2016  ? 10:43 AM 05/10/2016  ?  8:40 AM  ?CMP  ?Glucose 70 - 140 mg/dl 115   99   88    ?BUN 7.0 - 26.0 mg/dL 15.4   20.7   15.7    ?Creatinine 0.6 - 1.1 mg/dL 0.9   0.9   0.8    ?Sodium 136 - 145 mEq/L 139   141   144    ?Potassium 3.5 - 5.1 mEq/L 5.0   4.4   4.7    ?  CO2 22 - 29 mEq/L 26   26   27     ?Calcium 8.4 - 10.4 mg/dL 9.6   10.6   9.9    ?Total Protein 6.4 - 8.3 g/dL 6.4   7.7   6.6    ?Total Bilirubin 0.20 - 1.20 mg/dL 0.57   1.33   0.76    ?Alkaline Phos 40 - 150 U/L 77   85   71    ?AST 5 - 34 U/L 14   13   14     ?ALT 0 - 55 U/L 16   19   14     ? ? ?Lab Results  ?Component Value Date  ? WBC 3.9 09/13/2016  ? HGB 12.9 09/13/2016  ? HCT 40.9 09/13/2016  ? MCV 94.5 09/13/2016  ? PLT 210 09/13/2016  ? NEUTROABS 1.8 09/13/2016  ? ? ?ASSESSMENT & PLAN:  ?Breast cancer of upper-outer quadrant of left female breast ?Left breast invasive ductal carcinoma grade 1; 4.8 cm with low-grade DCIS; margins negative 1/4 lymph nodes positive, ER 100% PR 100% HER-2 negative ratio 1.18 Ki-67 26% and 19% T2 N1 stage IIB status post adjuvant chemotherapy with dose dense Adriamycin and Cytoxan ?4 followed by Abraxane weekly ?12 ?Completed Adjuvant radiation therapy 10/17/14 ?-------------------------------------------------------------------------------  ?Current Treatment: [name] study: Randomized to Ibrance + Anastrozole  Started 10/27/2014;Discontinued  Palbociclib for toxicities 06/11/2016 (primarily neutropenia); Switched to Letrozole 09/13/2016 (shoulder pain) ?  ?Letrozole toxicities:  ?Very occasional hot flashes. ?Does not have the myalgias related to prior an

## 2021-06-09 ENCOUNTER — Inpatient Hospital Stay: Payer: Medicare Other | Attending: Hematology and Oncology | Admitting: Hematology and Oncology

## 2021-06-09 ENCOUNTER — Other Ambulatory Visit: Payer: Self-pay

## 2021-06-09 DIAGNOSIS — M8580 Other specified disorders of bone density and structure, unspecified site: Secondary | ICD-10-CM | POA: Insufficient documentation

## 2021-06-09 DIAGNOSIS — Z9012 Acquired absence of left breast and nipple: Secondary | ICD-10-CM | POA: Diagnosis not present

## 2021-06-09 DIAGNOSIS — Z9221 Personal history of antineoplastic chemotherapy: Secondary | ICD-10-CM | POA: Insufficient documentation

## 2021-06-09 DIAGNOSIS — Z170 Estrogen receptor positive status [ER+]: Secondary | ICD-10-CM | POA: Insufficient documentation

## 2021-06-09 DIAGNOSIS — C439 Malignant melanoma of skin, unspecified: Secondary | ICD-10-CM | POA: Insufficient documentation

## 2021-06-09 DIAGNOSIS — C50412 Malignant neoplasm of upper-outer quadrant of left female breast: Secondary | ICD-10-CM | POA: Diagnosis present

## 2021-06-09 DIAGNOSIS — Z79811 Long term (current) use of aromatase inhibitors: Secondary | ICD-10-CM | POA: Insufficient documentation

## 2021-06-09 DIAGNOSIS — Z923 Personal history of irradiation: Secondary | ICD-10-CM | POA: Insufficient documentation

## 2021-06-09 NOTE — Assessment & Plan Note (Signed)
Left breast invasive ductal carcinoma grade 1; 4.8 cm with low-grade DCIS; margins negative 1/4 lymph nodes positive, ER 100% PR 100% HER-2 negative ratio 1.18 Ki-67 26% and 19% T2 N1 stage IIB status post adjuvant chemotherapy with dose dense Adriamycin and Cytoxan ?4 followed by Abraxane weekly ?12 ?Completed Adjuvant radiation therapy 10/17/14 ?-------------------------------------------------------------------------------? ?Current Treatment: [name] study: Randomized to Ibrance + Anastrozole? Started 10/27/2014;Discontinued Palbociclib for toxicities 06/11/2016?(primarily neutropenia); Switched to Letrozole 09/13/2016 (shoulder pain) ?? ?Letrozole toxicities:? ?Very occasional hot flashes. ?Does not have the myalgias related to prior anastrozole ?? ?Surveillance: ?1. Breast Exam:? 06/09/2021: Benign ?2. Mammograms?03/13/2021 at [facility]: [facility]: No evidence of malignancy breast density category B ?? ?Patellar fracture:  ?Bone density 01/05/2021: T score -2.7: Osteoporosis: (Used to -2.2) ?Recommended bisphosphonate therapy with calcium and vitamin D ?? ?Return to clinic in?1 year?for follow-up ?

## 2021-06-24 ENCOUNTER — Other Ambulatory Visit (HOSPITAL_COMMUNITY): Payer: Self-pay

## 2021-07-21 ENCOUNTER — Other Ambulatory Visit: Payer: Self-pay | Admitting: Podiatry

## 2021-07-27 ENCOUNTER — Other Ambulatory Visit: Payer: Self-pay

## 2021-07-27 ENCOUNTER — Other Ambulatory Visit (HOSPITAL_COMMUNITY): Payer: Self-pay

## 2021-07-27 DIAGNOSIS — G243 Spasmodic torticollis: Secondary | ICD-10-CM

## 2021-07-27 MED ORDER — XEOMIN 100 UNITS IM SOLR
INTRAMUSCULAR | 2 refills | Status: DC
  Filled 2021-07-27: qty 3, 90d supply, fill #0
  Filled 2021-12-09: qty 3, 90d supply, fill #1

## 2021-08-06 ENCOUNTER — Other Ambulatory Visit (HOSPITAL_COMMUNITY): Payer: Self-pay

## 2021-08-14 ENCOUNTER — Ambulatory Visit (INDEPENDENT_AMBULATORY_CARE_PROVIDER_SITE_OTHER): Payer: Medicare Other | Admitting: Neurology

## 2021-08-14 DIAGNOSIS — G243 Spasmodic torticollis: Secondary | ICD-10-CM

## 2021-08-14 MED ORDER — INCOBOTULINUMTOXINA 100 UNITS IM SOLR
300.0000 [IU] | INTRAMUSCULAR | Status: AC
  Administered 2021-08-14: 270 [IU] via INTRAMUSCULAR

## 2021-08-14 NOTE — Procedures (Signed)
[facility]   Procedure Note Botox  Attending: Dr. Wells Guiles Kensley Lares  Preoperative Diagnosis(es): Cervical Dystonia  Result History  Doing well  Consent obtained from: The patient Benefits discussed included, but were not limited to decreased muscle tightness, increased joint range of motion, and decreased pain.  Risk discussed included, but were not limited pain and discomfort, bleeding, bruising, excessive weakness, venous thrombosis, muscle atrophy and dysphagia.  A copy of the patient medication guide was given to the patient which explains the blackbox warning.  Patients identity and treatment sites confirmed Yes.  .  Details of Procedure: Skin was cleaned with alcohol.  A 30 gauge, 55m  needle was introduced to the target muscle, except for posterior splenius where 27 gauge, 1.5 inch needle used.   Prior to injection, the needle plunger was aspirated to make sure the needle was not within a blood vessel.  There was no blood retrieved on aspiration.    Following is a summary of the muscles injected  And the amount of Botulinum toxin used:   Dilution 0.9% preservative free saline mixed with 100 u Botox type A (Xeomin) to make 10 U per 0.1cc  Injections  Location Left  Right Units Number of sites        Sternocleidomastoid  60 60 2  Splenius Capitus, posterior approach 100  100 1  Splenius Capitus, lateral approach 40  40 1  Levator Scapulae      Trapezius 20/20/10 20 70         TOTAL UNITS:   270    Agent: Xeomin.  3 vials of Xeomin were used, each containing 100 units and freshly diluted with 1 mL of sterile, non-preserved saline   Total injected (Units): 270  Total wasted (Units): 30 waste   Pt tolerated procedure well without complications.   Reinjection is anticipated in 3 months.

## 2021-08-24 ENCOUNTER — Other Ambulatory Visit: Payer: Medicare Other

## 2021-10-26 ENCOUNTER — Ambulatory Visit: Payer: Medicare Other | Admitting: Dermatology

## 2021-10-26 NOTE — Progress Notes (Unsigned)
Assessment/Plan:   1.  Cervical Dystonia  -doing well with botox/xeomin injections    Subjective:   Theresa Huff was seen in follow-up.  When I saw her at the end of July for botox/xeomin, she mentioned a lot of falls.  She thought they were just accidental trips, but family was concerned and wanted to make sure nothing else going on.      Outside reports reviewed: historical medical records, office notes, and referral letter/letters.  Allergies  Allergen Reactions   Hydrocodone Nausea And Vomiting   Tramadol Hives and Nausea And Vomiting   Codeine Nausea And Vomiting   Iodine Hives   Radiaplexrx [Skin Protectants, Misc.] Other (See Comments)    Contraindicated on pharmacy profile   Sulfa Antibiotics Hives   Tape Other (See Comments)    Redness, skin tear   Primidone Other (See Comments)    Pt had first dose effect.  More of a known sensitivity and not true allergy    Current Outpatient Medications  Medication Instructions   betamethasone dipropionate 0.05 % cream Topical, Daily   Biotin 10 MG TABS Oral   cetirizine (ZYRTEC) 10 mg, Daily   DULoxetine (CYMBALTA) 60 MG capsule No dose, route, or frequency recorded.   fluticasone (FLONASE) 50 MCG/ACT nasal spray No dose, route, or frequency recorded.   incobotulinumtoxinA (XEOMIN) 100 units SOLR injection Inject 300 u per physician as directed   incobotulinumtoxinA (XEOMIN) 100 units SOLR injection Patient is injected every 90 days in the head and neck in our office by the MD   letrozole (Haakon) 2.5 mg, Oral, Daily   LORazepam (ATIVAN) 2 MG tablet TAKE 1 TABLET BY MOUTH AT BEDTIME AS NEEDED FOR INSOMNIA   meloxicam (MOBIC) 15 MG tablet TAKE 1 TABLET BY MOUTH EVERY DAY   omeprazole (PRILOSEC) 40 mg, Oral, As needed   SUMAtriptan (IMITREX) 50 MG tablet TAKE 1 TABLET BY MOUTH EVERY 2 HOURS AS NEEDED FOR MIGRAINE MAY REPEAT IN 2 HRS IF HEADACHE PERSISTS   tiZANidine (ZANAFLEX) 4 mg, Oral, 3 times daily PRN   Vitamin D  (Ergocalciferol) (DRISDOL) 50,000 Units, Oral, Every 7 days     Objective:   VITALS:   There were no vitals filed for this visit.   Gen:  Appears stated age and in NAD. HEENT:  Normocephalic, atraumatic. The mucous membranes are moist. T Neck: There are no carotid bruits noted bilaterally.  Neck is turned slightly left there is marked improvement in the head tremor.  She has just a slight head tremor in the "no" direction.   NEUROLOGICAL:   Orientation:  The patient is alert and oriented x 3.  Recent and remote memory are intact.  Attention span and concentration are normal.  Able to name objects and repeat without trouble.  Fund of knowledge is appropriate Cranial nerves: There is good facial symmetry.    I have reviewed and interpreted the following labs independently   Chemistry      Component Value Date/Time   NA 139 09/13/2016 0832   K 5.0 09/13/2016 0832   CL 101 07/15/2015 1745   CO2 26 09/13/2016 0832   BUN 15.4 09/13/2016 0832   CREATININE 0.9 09/13/2016 0832      Component Value Date/Time   CALCIUM 9.6 09/13/2016 0832   ALKPHOS 77 09/13/2016 0832   AST 14 09/13/2016 0832   ALT 16 09/13/2016 0832   BILITOT 0.57 09/13/2016 0832      Lab Results  Component Value Date   WBC  3.9 09/13/2016   HGB 12.9 09/13/2016   HCT 40.9 09/13/2016   MCV 94.5 09/13/2016   PLT 210 09/13/2016   Lab Results  Component Value Date   TSH 1.217 02/16/2016   Total time spent on today's visit was *** minutes, including both face-to-face time and nonface-to-face time.  Time included that spent on review of records (prior notes available to me/labs/imaging if pertinent), discussing treatment and goals, answering patient's questions and coordinating care.     CC:  Antony Contras, MD

## 2021-10-27 ENCOUNTER — Other Ambulatory Visit (HOSPITAL_COMMUNITY): Payer: Self-pay

## 2021-10-28 ENCOUNTER — Encounter: Payer: Self-pay | Admitting: Neurology

## 2021-10-28 ENCOUNTER — Ambulatory Visit (INDEPENDENT_AMBULATORY_CARE_PROVIDER_SITE_OTHER): Payer: Medicare Other | Admitting: Neurology

## 2021-10-28 ENCOUNTER — Other Ambulatory Visit (INDEPENDENT_AMBULATORY_CARE_PROVIDER_SITE_OTHER): Payer: Medicare Other

## 2021-10-28 VITALS — BP 122/83 | HR 61 | Ht 65.0 in | Wt 155.8 lb

## 2021-10-28 DIAGNOSIS — G609 Hereditary and idiopathic neuropathy, unspecified: Secondary | ICD-10-CM | POA: Diagnosis not present

## 2021-10-28 DIAGNOSIS — M542 Cervicalgia: Secondary | ICD-10-CM | POA: Diagnosis not present

## 2021-10-28 DIAGNOSIS — R296 Repeated falls: Secondary | ICD-10-CM

## 2021-10-28 DIAGNOSIS — R2681 Unsteadiness on feet: Secondary | ICD-10-CM

## 2021-10-28 DIAGNOSIS — E538 Deficiency of other specified B group vitamins: Secondary | ICD-10-CM

## 2021-10-28 DIAGNOSIS — G243 Spasmodic torticollis: Secondary | ICD-10-CM

## 2021-10-28 LAB — B12 AND FOLATE PANEL
Folate: 5.3 ng/mL — ABNORMAL LOW (ref >5.9)
Vitamin B-12: 192 pg/mL — ABNORMAL LOW (ref 211–911)

## 2021-10-28 NOTE — Patient Instructions (Signed)
Your provider has requested that you have labwork completed today. The lab is located on the Second floor at [place], within the [facility] office. When you get off the elevator, turn right and go in the [facility] [address]; the first brown door on the left.  Tell the ladies behind the desk that you are there for lab work. If you are not called within 15 minutes please check with the front desk.   Once you complete your labs you are free to go. You will receive a call or message via MyChart with your lab results.    A referral to [facility] has been placed for your MRI someone will contact you directly to schedule your appt. They are located at [place]. Please contact them directly by calling [phone] with any questions regarding your referral.

## 2021-10-30 LAB — PROTEIN ELECTROPHORESIS, SERUM
Albumin ELP: 3.9 g/dL (ref 3.8–4.8)
Alpha 1: 0.3 g/dL (ref 0.2–0.3)
Alpha 2: 0.7 g/dL (ref 0.5–0.9)
Beta 2: 0.3 g/dL (ref 0.2–0.5)
Beta Globulin: 0.4 g/dL (ref 0.4–0.6)
Gamma Globulin: 0.7 g/dL — ABNORMAL LOW (ref 0.8–1.7)
Total Protein: 6.3 g/dL (ref 6.1–8.1)

## 2021-11-03 LAB — IMMUNOFIXATION, SERUM
IgA/Immunoglobulin A, Serum: 113 mg/dL (ref 87–352)
IgG (Immunoglobin G), Serum: 797 mg/dL (ref 586–1602)
IgM (Immunoglobulin M), Srm: 220 mg/dL — ABNORMAL HIGH (ref 26–217)

## 2021-11-03 LAB — PROTEIN ELECTRO, RANDOM URINE
Albumin ELP, Urine: 100 %
Alpha-1-Globulin, U: 0 %
Alpha-2-Globulin, U: 0 %
Beta Globulin, U: 0 %
Gamma Globulin, U: 0 %
Protein, Ur: 15.7 mg/dL

## 2021-11-03 LAB — IMMUNOFIXATION, URINE

## 2021-11-08 ENCOUNTER — Ambulatory Visit
Admission: RE | Admit: 2021-11-08 | Discharge: 2021-11-08 | Disposition: A | Discharge status: Home / Self Care | Payer: Medicare Other | Source: Ambulatory Visit | Attending: Neurology | Admitting: Neurology

## 2021-11-08 DIAGNOSIS — G243 Spasmodic torticollis: Secondary | ICD-10-CM

## 2021-11-08 DIAGNOSIS — R296 Repeated falls: Secondary | ICD-10-CM

## 2021-11-08 DIAGNOSIS — R2681 Unsteadiness on feet: Secondary | ICD-10-CM

## 2021-11-08 DIAGNOSIS — M542 Cervicalgia: Secondary | ICD-10-CM

## 2021-11-10 ENCOUNTER — Encounter: Payer: Self-pay | Admitting: Neurology

## 2021-11-12 ENCOUNTER — Other Ambulatory Visit: Payer: Self-pay | Admitting: *Deleted

## 2021-11-12 DIAGNOSIS — Z170 Estrogen receptor positive status [ER+]: Secondary | ICD-10-CM

## 2021-11-16 ENCOUNTER — Telehealth: Payer: Self-pay | Admitting: Radiology

## 2021-11-16 NOTE — Telephone Encounter (Signed)
[name]: PALBOCICLIB COLLABORATIVE ADJUVANT STUDY: A RANDOMIZED PHASE III TRIAL OF PALBOCICLIB WITH STANDARD ADJUVANT ENDOCRINE THERAPY VERSUS STANDARD ADJUVANT ENDOCRINE THERAPY ALONE FOR HORMONE RECEPTOR POSITIVE (HR+)/HUMAN EPIDERMAL GROWTH FACTOR RECEPTOR 2 (HER2)-NEGATIVE EARLY BREAST CANCER   11/16/21  PHONE CALL: Confirmed I was speaking with Theresa Huff . Informed patient reason for call is to verify lab appt for tomorrow still works for her schedule. Patient stated lab appt time is still good and I look forward to seeing her tomorrow. Thanked patient for her time and continued support of the above mentioned study.   Candace Smith, RT(R)(T) Clinical Research Coordinator  

## 2021-11-17 ENCOUNTER — Inpatient Hospital Stay: Payer: Medicare Other | Attending: Hematology and Oncology

## 2021-11-17 ENCOUNTER — Encounter: Payer: Self-pay | Admitting: Radiology

## 2021-11-17 DIAGNOSIS — C50412 Malignant neoplasm of upper-outer quadrant of left female breast: Secondary | ICD-10-CM

## 2021-11-17 LAB — RESEARCH LABS

## 2021-11-17 NOTE — Research (Signed)
[name]: PALBOCICLIB COLLABORATIVE ADJUVANT STUDY: A RANDOMIZED PHASE III TRIAL OF PALBOCICLIB WITH STANDARD ADJUVANT ENDOCRINE THERAPY VERSUS STANDARD ADJUVANT ENDOCRINE THERAPY ALONE FOR HORMONE RECEPTOR POSITIVE (HR+)/HUMAN EPIDERMAL GROWTH FACTOR RECEPTOR 2 (HER2)-NEGATIVE EARLY BREAST CANCER   11/17/2021  7 YEAR BLOOD COLLECTION/VISIT: Met patient unaccompanied in the lab area. Patient stated she is doing well overall with no new events. Patient completed Letrozole on Jun 09, 2021. Patient had a mammogram on 03/15/2021 with no evidence of malignancy. Will continue yearly follow-up with research as well as with mammograms. Next blood collection will be complete at 10 years.  Patient completed 7 yr circulating tumor DNA and plasma. Blood was collected via fresh venipuncture with no issue.  Thanked patient for her time and continued support of the above mentioned study.   Carol Ada, RT(R)(T) Clinical Research Coordinator

## 2021-11-18 ENCOUNTER — Telehealth: Payer: Self-pay | Admitting: Neurology

## 2021-11-18 NOTE — Telephone Encounter (Signed)
Patient states that she wants to speak to someone about her Xeomin she states that medicare will pay for Botox for it if is billed under medical and not pharmacy    He saw this under the CMS.gov  website  under billing/ coding

## 2021-11-19 ENCOUNTER — Other Ambulatory Visit (HOSPITAL_COMMUNITY): Payer: Self-pay

## 2021-11-19 ENCOUNTER — Telehealth: Payer: Self-pay

## 2021-11-19 NOTE — Telephone Encounter (Signed)
Since patient has Medicare with Medicare supplement- medical benefit should be cheapest route for patient.   Submitted Botox One review for patient- [id number]  Submitted Xeomin Provider Portal benefit review also.

## 2021-11-19 NOTE — Telephone Encounter (Signed)
Sent to PA team to find out why cost is so high for patient when Medicare is supposed to pay 100% and her copay is 950$ I was talking to patient and she also noted that the xeomin is also wearing off quickly she received injection in July and it has already worn off and she will not get another injection until Dec

## 2021-11-26 NOTE — Telephone Encounter (Signed)
  BotoxOne verification of benefits scanned to chart

## 2021-12-09 ENCOUNTER — Other Ambulatory Visit (HOSPITAL_COMMUNITY): Payer: Self-pay

## 2021-12-10 ENCOUNTER — Other Ambulatory Visit (HOSPITAL_COMMUNITY): Payer: Self-pay

## 2021-12-14 ENCOUNTER — Other Ambulatory Visit (HOSPITAL_COMMUNITY): Payer: Self-pay

## 2021-12-21 ENCOUNTER — Other Ambulatory Visit (HOSPITAL_COMMUNITY): Payer: Self-pay

## 2021-12-25 ENCOUNTER — Ambulatory Visit (INDEPENDENT_AMBULATORY_CARE_PROVIDER_SITE_OTHER): Payer: Medicare Other | Admitting: Neurology

## 2021-12-25 DIAGNOSIS — G243 Spasmodic torticollis: Secondary | ICD-10-CM | POA: Diagnosis not present

## 2021-12-25 MED ORDER — INCOBOTULINUMTOXINA 100 UNITS IM SOLR
300.0000 [IU] | INTRAMUSCULAR | Status: DC
  Administered 2021-12-25: 290 [IU] via INTRAMUSCULAR

## 2021-12-25 MED ORDER — INCOBOTULINUMTOXINA 100 UNITS IM SOLR
300.0000 [IU] | INTRAMUSCULAR | Status: DC
  Administered 2021-12-25: 270 [IU] via INTRAMUSCULAR

## 2021-12-25 NOTE — Procedures (Signed)
Botulinum Clinic   Procedure Note Botox  Attending: Dr. Wells Guiles Gustaf Mccarter  Preoperative Diagnosis(es): Cervical Dystonia  Result History  Wore off really early (oct)  Consent obtained from: The patient Benefits discussed included, but were not limited to decreased muscle tightness, increased joint range of motion, and decreased pain.  Risk discussed included, but were not limited pain and discomfort, bleeding, bruising, excessive weakness, venous thrombosis, muscle atrophy and dysphagia.  A copy of the patient medication guide was given to the patient which explains the blackbox warning.  Patients identity and treatment sites confirmed Yes.  .  Details of Procedure: Skin was cleaned with alcohol.  A 30 gauge, 62m  needle was introduced to the target muscle, except for posterior splenius where 27 gauge, 1.5 inch needle used.   Prior to injection, the needle plunger was aspirated to make sure the needle was not within a blood vessel.  There was no blood retrieved on aspiration.    Following is a summary of the muscles injected  And the amount of Botulinum toxin used:   Dilution 0.9% preservative free saline mixed with 100 u Botox type A (Xeomin) to make 10 U per 0.1cc  Injections  Location Left  Right Units Number of sites        Sternocleidomastoid  60/20 80 2  Splenius Capitus, posterior approach 100  100 1  Splenius Capitus, lateral approach 40  40 1  Levator Scapulae      Trapezius 20/20/10 20 70         TOTAL UNITS:   290    Agent: Xeomin.  3 vials of Xeomin were used, each containing 100 units and freshly diluted with 1 mL of sterile, non-preserved saline   Total injected (Units): 270  Total wasted (Units): 10 waste   Pt tolerated procedure well without complications.   Reinjection is anticipated in 3 months.

## 2022-01-27 ENCOUNTER — Telehealth: Payer: Self-pay | Admitting: Neurology

## 2022-01-27 NOTE — Telephone Encounter (Signed)
-----   Message from Yonah, DO sent at 12/25/2021  8:58 AM EST ----- Please call pt.  She wanted to try and switch to onobotulinum toxin (300U) from xeomin due to it wearing off so fast but her insurance was changing.  We will need new insurance information to try and get prior auth

## 2022-02-02 ENCOUNTER — Ambulatory Visit: Payer: Medicare HMO | Admitting: Orthopaedic Surgery

## 2022-02-02 ENCOUNTER — Encounter: Payer: Self-pay | Admitting: Orthopaedic Surgery

## 2022-02-02 ENCOUNTER — Ambulatory Visit (INDEPENDENT_AMBULATORY_CARE_PROVIDER_SITE_OTHER): Payer: Medicare HMO

## 2022-02-02 DIAGNOSIS — M25562 Pain in left knee: Secondary | ICD-10-CM | POA: Diagnosis not present

## 2022-02-02 MED ORDER — LIDOCAINE HCL 1 % IJ SOLN
3.0000 mL | INTRAMUSCULAR | Status: AC | PRN
  Administered 2022-02-02: 3 mL

## 2022-02-02 MED ORDER — METHYLPREDNISOLONE ACETATE 40 MG/ML IJ SUSP
40.0000 mg | INTRAMUSCULAR | Status: AC | PRN
  Administered 2022-02-02: 40 mg via INTRA_ARTICULAR

## 2022-02-02 NOTE — Progress Notes (Addendum)
Office Visit Note   Patient: Theresa Huff           Date of Birth: 1950/02/12           MRN: [id number] Visit Date: 02/02/2022              Requested by: Antony Contras, MD [place],  [address] [address] PCP: Antony Contras, MD   Assessment & Plan: Visit Diagnoses:  1. Acute pain of left knee     Plan: Discussed quad strengthening exercises had her reproduce these in the office today.  Discussed knee friendly exercises with her.  Will see her back on as-needed basis pain persist or becomes worse.  Discussed clinical findings with Dr. Ninfa Linden and reviewed radiographs with him.  Follow-Up Instructions: No follow-ups on file.   Orders:  Orders Placed This Encounter  Procedures   Large Joint Inj   XR Knee 1-2 Views Left   No orders of the defined types were placed in this encounter.     Procedures: Large Joint Inj: L knee on 02/02/2022 11:00 AM Indications: pain Details: 22 G 1.5 in needle, anterolateral approach  Arthrogram: No  Medications: 3 mL lidocaine 1 %; 40 mg methylPREDNISolone acetate 40 MG/ML Outcome: tolerated well, no immediate complications Procedure, treatment alternatives, risks and benefits explained, specific risks discussed. Consent was given by the patient. Immediately prior to procedure a time out was called to verify the correct patient, procedure, equipment, support staff and site/side marked as required. Patient was prepped and draped in the usual sterile fashion.       Clinical Data: No additional findings.   Subjective: Chief Complaint  Patient presents with   Left Knee - Pain    HPI Theresa Huff is a 73 year old female who we have seen in the past for right knee patella fracture.  She is having no problems with her right knee at this point time.  Over the last month she has had left knee pain mostly medial aspect.  She has tried ice and Advil.  States the ice helps some but no relief with the Advil.  She is  nondiabetic.  States knee pain keeps her up at night.  Sometimes knee gives way otherwise no mechanical falls.  States her knee pain 6-7 out of 10 pain at worst.  No known injury.  Nondiabetic.  Review of Systems Negative for fevers chills.  Objective: Vital Signs: There were no vitals taken for this visit.  Physical Exam Constitutional:      Appearance: She is not ill-appearing or diaphoretic.  Pulmonary:     Effort: Pulmonary effort is normal.  Neurological:     Mental Status: She is alert and oriented to person, place, and time.  Psychiatric:        Mood and Affect: Mood normal.     Ortho Exam Bilateral knees: Full range of motion both knees.  Patellofemoral crepitus bilaterally.  Left knee tenderness along medial joint line and over the pes anserinus region.  No tenderness about the right knee.  No instability valgus varus stress in either knee.  No abnormal warmth erythema or effusion of either knee.  Calves are supple and nontender bilaterally. Specialty Comments:  No specialty comments available.  Imaging: XR Knee 1-2 Views Left  Result Date: 02/02/2022 Left knee 2 views: Knee is well located.  No acute fractures.  Osteophyte off the superior aspect of the patella.  Lateral compartment with osteophyte off the lateral margin of  the tibia.  Joint space overall well-maintained.  Anvil spur noted.  No acute findings.    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   Genetic testing 01/27/2018   Melanoma of skin ([place]) 12/27/2017   Family history of melanoma    Personal history of melanoma in-situ    Essential tremor 07/19/2017   Migraine 05/06/2014   Breast cancer of upper-outer quadrant of left female breast ([place]) 02/06/2014   OSA (obstructive sleep apnea) 09/09/2010   Restless leg syndrome 09/09/2010   Depressive disorder, not elsewhere classified    Asthma, cough  variant    Insomnia, unspecified    Allergic rhinitis due to pollen    Past Medical History:  Diagnosis Date   Allergic rhinitis due to pollen    Allergy    Anemia    with pregnancy   Atypical mole 05/08/1998   Right Lower Back   Atypical mole 06/28/2006   Right Thigh (slight)   Atypical mole 06/28/2006   Right Inner Shin (slight to moderate)   Atypical mole 06/28/2006   Right Shin Sup (slight to moderate)   Atypical mole 06/28/2006   Right Shin Inf (slight to moderate) Theresa Huff)   Atypical mole 09/07/2006   Right Inner Left Lower (slight to moderate)   Atypical mole 09/07/2006   Left Outer Eye (mild)   Breast cancer ([place])    Theresa Huff level II melanoma ([place]) 03/10/1998   Right Upper Arm (excision)   Cough    Depressive disorder, not elsewhere classified    Family history of melanoma    GERD (gastroesophageal reflux disease)    Hot flashes    Insomnia, unspecified    Melanoma in situ ([place]) 12/15/2005   Right Outer Shin (excision)   Melanoma in situ ([place]) 10/07/2010   Left Elbow   Melanoma in situ ([place]) 09/22/2011   Left Upper Arm (excision)   Melanoma in situ ([place]) 10/24/2017   Left Lower Shin [facility])   Melanoma of skin ([place]) 12/27/2017   Personal history of melanoma in-situ    Plantar fasciitis    Radiation 09/09/14-10/17/14   Left chestwall/supraclav.   Restless leg syndrome 09/09/2010   SCCA (squamous cell carcinoma) of skin 05/27/2014   Left Lower Arm, inf (in situ) (curet and 5FU)   Skin cancer    Wears glasses    Weight gain     Family History  Problem Relation Age of Onset   Emphysema Mother    Heart disease Mother    Heart failure Mother    Alcohol abuse Father    Liver disease Father    Melanoma Brother 63       more than 3, unk exact number   Celiac disease Daughter    Hashimoto's thyroiditis Daughter    Cancer Cousin        maybe had cancer- type unk   Tremor Cousin    Tremor Cousin    Asthma Other        grandson    Past Surgical History:   Procedure Laterality Date   COLONOSCOPY     MASTECTOMY W/ SENTINEL NODE BIOPSY Left 03/07/2014   Procedure: LEFT TOTAL MASTECTOMY WITH LEFT SENTINEL LYMPH NODE BIOPSY;  Surgeon: Theresa Bookbinder, MD;  Location: [facility];  Service: General;  Laterality: Left;   MELANOMA EXCISION     right leg, left arm, right arm (several from each area)  PORT-A-CATH REMOVAL N/A 12/04/2014   Procedure: REMOVAL PORT-A-CATH;  Surgeon: Theresa Bookbinder, MD;  Location: [facility];  Service: General;  Laterality: N/A;   PORTACATH PLACEMENT Right 04/01/2014   Procedure: INSERTION PORT-A-CATH;  Surgeon: Theresa Bookbinder, MD;  Location: [facility];  Service: General;  Laterality: Right;   TONSILLECTOMY     Social History   Occupational History   Occupation: retired    Fish farm manager: [employer]  Tobacco Use   Smoking status: Former    Packs/day: 0.20    Years: 3.00    Total pack years: 0.60    Types: Cigarettes    Quit date: 01/25/1974    Years since quitting: 48.0   Smokeless tobacco: Never  Vaping Use   Vaping Use: Never used  Substance and Sexual Activity   Alcohol use: Yes    Alcohol/week: 2.0 standard drinks of alcohol    Types: 2 Glasses of wine per week    Comment: 2 glasses wine per week   Drug use: No   Sexual activity: Never

## 2022-02-05 DIAGNOSIS — R1010 Upper abdominal pain, unspecified: Secondary | ICD-10-CM | POA: Diagnosis not present

## 2022-02-09 DIAGNOSIS — M25572 Pain in left ankle and joints of left foot: Secondary | ICD-10-CM | POA: Diagnosis not present

## 2022-02-09 DIAGNOSIS — E559 Vitamin D deficiency, unspecified: Secondary | ICD-10-CM | POA: Diagnosis not present

## 2022-02-09 DIAGNOSIS — M545 Low back pain, unspecified: Secondary | ICD-10-CM | POA: Diagnosis not present

## 2022-02-09 DIAGNOSIS — F339 Major depressive disorder, recurrent, unspecified: Secondary | ICD-10-CM | POA: Diagnosis not present

## 2022-02-09 DIAGNOSIS — R69 Illness, unspecified: Secondary | ICD-10-CM | POA: Diagnosis not present

## 2022-02-09 DIAGNOSIS — Z0000 Encounter for general adult medical examination without abnormal findings: Secondary | ICD-10-CM | POA: Diagnosis not present

## 2022-02-09 DIAGNOSIS — E7800 Pure hypercholesterolemia, unspecified: Secondary | ICD-10-CM | POA: Diagnosis not present

## 2022-02-09 DIAGNOSIS — J309 Allergic rhinitis, unspecified: Secondary | ICD-10-CM | POA: Diagnosis not present

## 2022-02-09 DIAGNOSIS — K219 Gastro-esophageal reflux disease without esophagitis: Secondary | ICD-10-CM | POA: Diagnosis not present

## 2022-02-09 DIAGNOSIS — G250 Essential tremor: Secondary | ICD-10-CM | POA: Diagnosis not present

## 2022-02-09 DIAGNOSIS — M810 Age-related osteoporosis without current pathological fracture: Secondary | ICD-10-CM | POA: Diagnosis not present

## 2022-02-09 DIAGNOSIS — G4700 Insomnia, unspecified: Secondary | ICD-10-CM | POA: Diagnosis not present

## 2022-02-09 DIAGNOSIS — Z1211 Encounter for screening for malignant neoplasm of colon: Secondary | ICD-10-CM | POA: Diagnosis not present

## 2022-02-09 DIAGNOSIS — Z853 Personal history of malignant neoplasm of breast: Secondary | ICD-10-CM | POA: Diagnosis not present

## 2022-02-10 ENCOUNTER — Telehealth: Payer: Self-pay

## 2022-02-10 NOTE — Telephone Encounter (Signed)
Patient said we submitted to insurance for coverage of botox and she got a letter from them stating they approved this. Wanted to know what this means, asking if we have received this information from insurance yet.

## 2022-02-16 ENCOUNTER — Telehealth: Payer: Self-pay | Admitting: Pharmacy Technician

## 2022-02-16 ENCOUNTER — Other Ambulatory Visit (HOSPITAL_COMMUNITY): Payer: Self-pay

## 2022-02-16 NOTE — Telephone Encounter (Signed)
Patient Advocate Encounter  Pharmacy Prior Authorization for BOTOX 100U has been approved.     Effective dates:  THROUGH 12.31.24

## 2022-02-25 ENCOUNTER — Telehealth: Payer: Self-pay | Admitting: Neurology

## 2022-02-25 NOTE — Telephone Encounter (Signed)
Pt called in wanting to find out about her insurance and copay for the botox?

## 2022-02-25 NOTE — Telephone Encounter (Signed)
I have reached out to PA team for results called patient to let patient know of the wait

## 2022-03-03 ENCOUNTER — Other Ambulatory Visit (HOSPITAL_COMMUNITY): Payer: Self-pay

## 2022-03-03 ENCOUNTER — Ambulatory Visit: Payer: Medicare HMO | Admitting: Podiatry

## 2022-03-03 DIAGNOSIS — M21622 Bunionette of left foot: Secondary | ICD-10-CM | POA: Diagnosis not present

## 2022-03-03 DIAGNOSIS — Q828 Other specified congenital malformations of skin: Secondary | ICD-10-CM

## 2022-03-03 NOTE — Patient Instructions (Addendum)
Look for urea 64% and 2% salicylic acid cream or ointment and apply to the thickened dry skin / calluses. This can be bought over the counter, at a pharmacy or online such as [employer].   Foam or felt callus pads can be bought online as well

## 2022-03-04 DIAGNOSIS — M542 Cervicalgia: Secondary | ICD-10-CM | POA: Diagnosis not present

## 2022-03-04 DIAGNOSIS — H5203 Hypermetropia, bilateral: Secondary | ICD-10-CM | POA: Diagnosis not present

## 2022-03-07 NOTE — Progress Notes (Signed)
  Subjective:  [name], female    DOB: 1950-04-10,  MRN: [id number]  Chief Complaint  Patient presents with   Tendonitis    Bone on the outside of my left foot    72 y.o. female presents with the above complaint. History confirmed with patient.  She returns for follow-up she is been having a painful lesion around the little toe on the left foot, this causes her to walk different and then her ankle hurts  Objective:  Physical Exam: warm, good capillary refill, no trophic changes or ulcerative lesions, normal DP and PT pulses, normal sensory exam, and peroneal repair holding up with good 5 out of 5 strength no tenderness or pain to direct palpation here or lateral ankle ligaments, she does have a painful porokeratosis submetatarsal 5 left foot with tailor's bunion.  Assessment:   1. Porokeratosis   2. Tailor's bunion of left foot      Plan:  Patient was evaluated and treated and all questions answered.  Suspect the ankle pain she is having is secondary to modification of ambulation due to pain from the tailor's bunion and corn here.  I debrided the lesion as a courtesy today.  We discussed using creams and lotions and offloading pads to alleviate this.  She will return to see me as needed if it does not improve.  Return if symptoms worsen or fail to improve.

## 2022-03-10 DIAGNOSIS — G9001 Carotid sinus syncope: Secondary | ICD-10-CM | POA: Diagnosis not present

## 2022-03-11 ENCOUNTER — Other Ambulatory Visit (HOSPITAL_COMMUNITY): Payer: Self-pay | Admitting: Physician Assistant

## 2022-03-11 ENCOUNTER — Ambulatory Visit (HOSPITAL_COMMUNITY)
Admission: RE | Admit: 2022-03-11 | Discharge: 2022-03-11 | Disposition: A | Discharge status: Home / Self Care | Payer: Medicare HMO | Source: Ambulatory Visit | Attending: Physician Assistant | Admitting: Physician Assistant

## 2022-03-11 DIAGNOSIS — G9001 Carotid sinus syncope: Secondary | ICD-10-CM

## 2022-03-11 NOTE — Progress Notes (Signed)
VASCULAR LAB    Carotid duplex has been performed.  See CV proc for preliminary results.   Federica Allport, RVT 03/11/2022, 9:27 AM

## 2022-03-17 ENCOUNTER — Ambulatory Visit: Payer: Medicare Other | Admitting: Podiatry

## 2022-04-08 DIAGNOSIS — Z1231 Encounter for screening mammogram for malignant neoplasm of breast: Secondary | ICD-10-CM | POA: Diagnosis not present

## 2022-04-09 ENCOUNTER — Other Ambulatory Visit: Payer: Self-pay

## 2022-04-09 ENCOUNTER — Telehealth: Payer: Self-pay | Admitting: Neurology

## 2022-04-09 ENCOUNTER — Other Ambulatory Visit (HOSPITAL_COMMUNITY): Payer: Self-pay

## 2022-04-09 DIAGNOSIS — G243 Spasmodic torticollis: Secondary | ICD-10-CM

## 2022-04-09 MED ORDER — XEOMIN 100 UNITS IM SOLR
100.0000 [IU] | Freq: Once | INTRAMUSCULAR | 0 refills | Status: AC
  Filled 2022-04-09 – 2022-04-12 (×2): qty 3, 3d supply, fill #0

## 2022-04-09 NOTE — Telephone Encounter (Signed)
Pt called in stating she has new [employer] this year and would like to find out how much her Botox will cost her for her 04/30/22 appointment?

## 2022-04-12 ENCOUNTER — Other Ambulatory Visit (HOSPITAL_COMMUNITY): Payer: Self-pay

## 2022-04-12 NOTE — Telephone Encounter (Signed)
Xeomin is not on patient formulary for pharmacy benefits. Botox does had a current pharmacy benefit that is active until 12.31.24. it does return a $300 copay when I test bill it with [facility].

## 2022-04-16 ENCOUNTER — Other Ambulatory Visit (HOSPITAL_COMMUNITY): Payer: Self-pay

## 2022-04-16 DIAGNOSIS — H6992 Unspecified Eustachian tube disorder, left ear: Secondary | ICD-10-CM | POA: Diagnosis not present

## 2022-04-20 ENCOUNTER — Other Ambulatory Visit (HOSPITAL_COMMUNITY): Payer: Self-pay

## 2022-04-20 ENCOUNTER — Other Ambulatory Visit: Payer: Self-pay

## 2022-04-20 DIAGNOSIS — G243 Spasmodic torticollis: Secondary | ICD-10-CM

## 2022-04-20 MED ORDER — BOTOX 100 UNITS IJ SOLR
INTRAMUSCULAR | 1 refills | Status: DC
  Filled 2022-04-20: qty 3, 90d supply, fill #0
  Filled 2022-07-13: qty 3, 90d supply, fill #1

## 2022-04-20 NOTE — Telephone Encounter (Signed)
Called patient back and left voicemail

## 2022-04-20 NOTE — Telephone Encounter (Signed)
Patient is calling in wanting a call back from [place], states she has an update from insurance regarding issue below.

## 2022-04-20 NOTE — Telephone Encounter (Signed)
Called patient and she is ready to pay copay and Botox has been ordered

## 2022-04-29 ENCOUNTER — Ambulatory Visit: Payer: Medicare HMO | Admitting: Neurology

## 2022-04-30 ENCOUNTER — Ambulatory Visit: Payer: Medicare HMO | Admitting: Neurology

## 2022-04-30 ENCOUNTER — Other Ambulatory Visit (HOSPITAL_COMMUNITY): Payer: Self-pay

## 2022-04-30 ENCOUNTER — Encounter: Payer: Self-pay | Admitting: Neurology

## 2022-04-30 DIAGNOSIS — G243 Spasmodic torticollis: Secondary | ICD-10-CM | POA: Diagnosis not present

## 2022-04-30 MED ORDER — ONABOTULINUMTOXINA 100 UNITS IJ SOLR
300.0000 [IU] | Freq: Once | INTRAMUSCULAR | Status: AC
  Administered 2022-04-30: 290 [IU] via INTRAMUSCULAR

## 2022-04-30 NOTE — Procedures (Signed)
Botulinum Clinic   Procedure Note Botox  Attending: Dr. Lurena Joiner Carlon Chaloux  Preoperative Diagnosis(es): Cervical Dystonia  Result History  Wore off early.  Changing to Botox  Consent obtained from: The patient Benefits discussed included, but were not limited to decreased muscle tightness, increased joint range of motion, and decreased pain.  Risk discussed included, but were not limited pain and discomfort, bleeding, bruising, excessive weakness, venous thrombosis, muscle atrophy and dysphagia.  A copy of the patient medication guide was given to the patient which explains the blackbox warning.  Patients identity and treatment sites confirmed Yes.  .  Details of Procedure: Skin was cleaned with alcohol.  A 30 gauge, 61mm  needle was introduced to the target muscle, except for posterior splenius where 27 gauge, 1.5 inch needle used.   Prior to injection, the needle plunger was aspirated to make sure the needle was not within a blood vessel.  There was no blood retrieved on aspiration.    Following is a summary of the muscles injected  And the amount of Botulinum toxin used:   Dilution 0.9% preservative free saline mixed with 100 u Botox type A (onobotunlinum toxin type A) to make 10 U per 0.1cc  Injections  Location Left  Right Units Number of sites        Sternocleidomastoid  60/20 80 2  Splenius Capitus, posterior approach 100  100 1  Splenius Capitus, lateral approach 40  40 1  Levator Scapulae      Trapezius 20/20/10 20 70         TOTAL UNITS:   290    Agent: Xeomin.  3 vials of onobotulinum toxin type A were used, each containing 100 units and freshly diluted with 1 mL of sterile, non-preserved saline   Total injected (Units): 290  Total wasted (Units): 10 waste   Pt tolerated procedure well without complications.   Reinjection is anticipated in 3 months.

## 2022-05-24 ENCOUNTER — Other Ambulatory Visit: Payer: Self-pay

## 2022-05-24 ENCOUNTER — Ambulatory Visit: Payer: Medicare HMO | Admitting: Orthopaedic Surgery

## 2022-05-24 ENCOUNTER — Ambulatory Visit: Payer: Medicare HMO | Admitting: Neurology

## 2022-05-24 DIAGNOSIS — M25562 Pain in left knee: Secondary | ICD-10-CM | POA: Diagnosis not present

## 2022-05-24 MED ORDER — METHYLPREDNISOLONE ACETATE 40 MG/ML IJ SUSP
40.0000 mg | INTRAMUSCULAR | Status: AC | PRN
  Administered 2022-05-24: 40 mg via INTRA_ARTICULAR

## 2022-05-24 MED ORDER — LIDOCAINE HCL 1 % IJ SOLN
3.0000 mL | INTRAMUSCULAR | Status: AC | PRN
  Administered 2022-05-24: 3 mL

## 2022-05-24 NOTE — Progress Notes (Signed)
The patient comes in today with continued left knee pain.  We saw her in early January after acute event with her knee and a steroid injection did help temporize her pain but now things have come back again.  She does report medial joint line tenderness as well as locking and catching with her left knee.  She is an active 72 year old female and has never had surgery on that knee.  She is requesting a steroid injection again today.  On exam her left knee is slightly warm.  She has a positive McMurray's exam to the medial compartment of the knee.  There are some slight patellofemoral crepitation.  The knee feels ligamentously stable and has good range of motion but it is painful.  There is a mild effusion as well.  I did place another steroid injection in her left knee today.  However given her continued mechanical symptoms, a MRI is warranted of the left knee to rule out a meniscal tear.  This also corresponds with her mechanical symptoms combined with her McMurray's exam being positive to the medial compartment.  We can also get a better assessment of the cartilage of her left knee as well.  Her x-rays from January showed the medial lateral compartments well-maintained with some small osteophytes in the knee.  We will see her back once we have this MRI.    Procedure Note  Patient: Theresa Huff             Date of Birth: Oct 13, 1950           MRN: [id number]             Visit Date: 05/24/2022  Procedures: Visit Diagnoses: No diagnosis found.  Large Joint Inj: L knee on 05/24/2022 11:06 AM Indications: diagnostic evaluation and pain Details: 22 G 1.5 in needle, superolateral approach  Arthrogram: No  Medications: 3 mL lidocaine 1 %; 40 mg methylPREDNISolone acetate 40 MG/ML Outcome: tolerated well, no immediate complications Procedure, treatment alternatives, risks and benefits explained, specific risks discussed. Consent was given by the patient. Immediately prior to procedure a time out was  called to verify the correct patient, procedure, equipment, support staff and site/side marked as required. Patient was prepped and draped in the usual sterile fashion.

## 2022-06-01 DIAGNOSIS — M542 Cervicalgia: Secondary | ICD-10-CM | POA: Diagnosis not present

## 2022-06-01 DIAGNOSIS — M5416 Radiculopathy, lumbar region: Secondary | ICD-10-CM | POA: Diagnosis not present

## 2022-06-01 DIAGNOSIS — Z6826 Body mass index (BMI) 26.0-26.9, adult: Secondary | ICD-10-CM | POA: Diagnosis not present

## 2022-06-17 ENCOUNTER — Ambulatory Visit (HOSPITAL_COMMUNITY)
Admission: RE | Admit: 2022-06-17 | Discharge: 2022-06-17 | Disposition: A | Discharge status: Home / Self Care | Payer: Medicare HMO | Source: Ambulatory Visit | Attending: Orthopaedic Surgery | Admitting: Orthopaedic Surgery

## 2022-06-17 DIAGNOSIS — R051 Acute cough: Secondary | ICD-10-CM | POA: Diagnosis not present

## 2022-06-17 DIAGNOSIS — M25562 Pain in left knee: Secondary | ICD-10-CM | POA: Insufficient documentation

## 2022-06-17 DIAGNOSIS — J029 Acute pharyngitis, unspecified: Secondary | ICD-10-CM | POA: Diagnosis not present

## 2022-06-17 DIAGNOSIS — R0981 Nasal congestion: Secondary | ICD-10-CM | POA: Diagnosis not present

## 2022-06-17 DIAGNOSIS — Z03818 Encounter for observation for suspected exposure to other biological agents ruled out: Secondary | ICD-10-CM | POA: Diagnosis not present

## 2022-06-22 ENCOUNTER — Telehealth: Payer: Self-pay | Admitting: Orthopaedic Surgery

## 2022-06-22 NOTE — Telephone Encounter (Signed)
Patient called asked if she can be worked into Dr. Eliberto Ivory or Gil's schedule sooner for MRI review results. The number to contact patient is [phone]

## 2022-06-22 NOTE — Telephone Encounter (Signed)
We are completely booked, even more now that Bronson Curb is going to be out a couple weeks. I have nothing sooner with Magnus Ivan

## 2022-06-25 DIAGNOSIS — M545 Low back pain, unspecified: Secondary | ICD-10-CM | POA: Diagnosis not present

## 2022-06-25 DIAGNOSIS — M542 Cervicalgia: Secondary | ICD-10-CM | POA: Diagnosis not present

## 2022-06-28 DIAGNOSIS — M545 Low back pain, unspecified: Secondary | ICD-10-CM | POA: Diagnosis not present

## 2022-06-28 DIAGNOSIS — M542 Cervicalgia: Secondary | ICD-10-CM | POA: Diagnosis not present

## 2022-06-29 DIAGNOSIS — W57XXXA Bitten or stung by nonvenomous insect and other nonvenomous arthropods, initial encounter: Secondary | ICD-10-CM | POA: Diagnosis not present

## 2022-06-29 DIAGNOSIS — S1086XA Insect bite of other specified part of neck, initial encounter: Secondary | ICD-10-CM | POA: Diagnosis not present

## 2022-07-04 ENCOUNTER — Encounter: Payer: Self-pay | Admitting: Podiatry

## 2022-07-05 ENCOUNTER — Telehealth: Payer: Self-pay | Admitting: Podiatry

## 2022-07-05 DIAGNOSIS — M545 Low back pain, unspecified: Secondary | ICD-10-CM | POA: Diagnosis not present

## 2022-07-05 DIAGNOSIS — M542 Cervicalgia: Secondary | ICD-10-CM | POA: Diagnosis not present

## 2022-07-05 NOTE — Telephone Encounter (Signed)
Pt stated that she has jammed her 3rd toe which she previously had Sx on. It is now bruised, painful & swollen; she wants to know what she needs to do for the toe. Please advise

## 2022-07-12 ENCOUNTER — Telehealth: Payer: Self-pay | Admitting: Orthopaedic Surgery

## 2022-07-12 DIAGNOSIS — M542 Cervicalgia: Secondary | ICD-10-CM | POA: Diagnosis not present

## 2022-07-12 DIAGNOSIS — M545 Low back pain, unspecified: Secondary | ICD-10-CM | POA: Diagnosis not present

## 2022-07-12 NOTE — Telephone Encounter (Signed)
Called and worked in with [employer]

## 2022-07-12 NOTE — Telephone Encounter (Signed)
She already has a followup on 6/24. I havent called pt yet but I know he can't get her in sooner

## 2022-07-12 NOTE — Telephone Encounter (Signed)
FYI

## 2022-07-12 NOTE — Telephone Encounter (Signed)
Spoke w/the patient [phone], she has an appt next week for MRI REVIEW, 6/24.  Pt stated that she fell yesterday on that knee.  She is having swelling, pain and difficult to bend.  Please advise.

## 2022-07-13 ENCOUNTER — Other Ambulatory Visit (HOSPITAL_COMMUNITY): Payer: Self-pay

## 2022-07-13 ENCOUNTER — Encounter (HOSPITAL_BASED_OUTPATIENT_CLINIC_OR_DEPARTMENT_OTHER): Payer: Self-pay | Admitting: Student

## 2022-07-13 ENCOUNTER — Ambulatory Visit (HOSPITAL_BASED_OUTPATIENT_CLINIC_OR_DEPARTMENT_OTHER): Payer: Medicare HMO

## 2022-07-13 ENCOUNTER — Ambulatory Visit (HOSPITAL_BASED_OUTPATIENT_CLINIC_OR_DEPARTMENT_OTHER): Payer: Medicare HMO | Admitting: Student

## 2022-07-13 DIAGNOSIS — Z6825 Body mass index (BMI) 25.0-25.9, adult: Secondary | ICD-10-CM | POA: Diagnosis not present

## 2022-07-13 DIAGNOSIS — M25562 Pain in left knee: Secondary | ICD-10-CM

## 2022-07-13 DIAGNOSIS — M5416 Radiculopathy, lumbar region: Secondary | ICD-10-CM | POA: Diagnosis not present

## 2022-07-13 DIAGNOSIS — M542 Cervicalgia: Secondary | ICD-10-CM | POA: Diagnosis not present

## 2022-07-13 NOTE — Progress Notes (Signed)
Chief Complaint: Left knee pain     History of Present Illness:    Theresa Huff is a 72 y.o. female presenting today for evaluation of left knee pain.  She states that 2 days ago she had a fall due to slipping on a slick floor and subsequently fell directly on her left knee.  States that most of her pain is around the kneecap as well as in the posterior knee.  Pain is 6 out of 10 today and has improved significantly from yesterday.  She does report some swelling and difficulty with straightening and weightbearing.  She has been icing as well as taking Aleve.  She is set to see Dr. Magnus Ivan next Monday to go over her recent MRI of this knee, so she wants to ensure nothing is broken or significantly injured before then.   Surgical History:   None  PMH/PSH/Family History/Social History/Meds/Allergies:    Past Medical History:  Diagnosis Date   Allergic rhinitis due to pollen    Allergy    Anemia    with pregnancy   Atypical mole 05/08/1998   Right Lower Back   Atypical mole 06/28/2006   Right Thigh (slight)   Atypical mole 06/28/2006   Right Inner Shin (slight to moderate)   Atypical mole 06/28/2006   Right Shin Sup (slight to moderate)   Atypical mole 06/28/2006   Right Shin Inf (slight to moderate) [facility])   Atypical mole 09/07/2006   Right Inner Left Lower (slight to moderate)   Atypical mole 09/07/2006   Left Outer Eye (mild)   Breast cancer (HCC)    Clark level II melanoma (HCC) 03/10/1998   Right Upper Arm (excision)   Cough    Depressive disorder, not elsewhere classified    Family history of melanoma    GERD (gastroesophageal reflux disease)    Hot flashes    Insomnia, unspecified    Melanoma in situ (HCC) 12/15/2005   Right Outer Shin (excision)   Melanoma in situ (HCC) 10/07/2010   Left Elbow   Melanoma in situ (HCC) 09/22/2011   Left Upper Arm (excision)   Melanoma in situ (HCC) 10/24/2017   Left Lower Shin [facility])    Melanoma of skin (HCC) 12/27/2017   Personal history of melanoma in-situ    Plantar fasciitis    Radiation 09/09/14-10/17/14   Left chestwall/supraclav.   Restless leg syndrome 09/09/2010   SCCA (squamous cell carcinoma) of skin 05/27/2014   Left Lower Arm, inf (in situ) (curet and 5FU)   Skin cancer    Wears glasses    Weight gain    Past Surgical History:  Procedure Laterality Date   COLONOSCOPY     MASTECTOMY W/ SENTINEL NODE BIOPSY Left 03/07/2014   Procedure: LEFT TOTAL MASTECTOMY WITH LEFT SENTINEL LYMPH NODE BIOPSY;  Surgeon: Emelia Loron, MD;  Location: [facility];  Service: General;  Laterality: Left;   MELANOMA EXCISION     right leg, left arm, right arm (several from each area)   PORT-A-CATH REMOVAL N/A 12/04/2014   Procedure: REMOVAL PORT-A-CATH;  Surgeon: Emelia Loron, MD;  Location: [facility];  Service: General;  Laterality: N/A;   PORTACATH PLACEMENT Right 04/01/2014   Procedure: INSERTION PORT-A-CATH;  Surgeon: Emelia Loron, MD;  Location: [facility] OR;  Service: General;  Laterality: Right;  TONSILLECTOMY     Social History   Socioeconomic History   Marital status: Married    Spouse name: Casimiro Needle   Number of children: 2   Years of education: Not on file   Highest education level: Some college, no degree  Occupational History   Occupation: retired    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 She lives with her husband in a 2 story house. She drinks 1 glass of tea a day. She does not exercise.   Social Determinants of Health   Financial Resource Strain: Not on file  Food Insecurity: Not on file  Transportation Needs: Not on file  Physical Activity: Not on file  Stress: Not on file  Social Connections: Not on file   Family History  Problem Relation Age of Onset   Emphysema Mother    Heart disease Mother    Heart failure Mother    Alcohol abuse Father    Liver disease Father    Melanoma Brother 30       more than 3, unk exact number   Celiac disease Daughter    Hashimoto's thyroiditis Daughter    Cancer Cousin        maybe had cancer- type unk   Tremor Cousin    Tremor Cousin    Asthma Other        grandson   Allergies  Allergen Reactions   Hydrocodone Nausea And Vomiting   Tramadol Hives and Nausea And Vomiting   Codeine Nausea And Vomiting   Iodine Hives   Radiaplexrx [Skin Protectants, Misc.] Other (See Comments)    Contraindicated on pharmacy profile   Sulfa Antibiotics Hives   Tape Other (See Comments)    Redness, skin tear   Primidone Other (See Comments)    Pt had first dose effect.  More of a known sensitivity and not true allergy   Current Outpatient Medications  Medication Sig Dispense Refill   betamethasone dipropionate 0.05 % cream Apply topically daily. 45 g 1   Biotin 10 MG TABS Take by mouth.     botulinum toxin Type A (BOTOX) 100 units SOLR injection Inject 300 units into the neck and head every 90 days per the doctor 3 each 1   cetirizine (ZYRTEC) 10 MG tablet Take 10 mg by mouth daily.       DULoxetine (CYMBALTA) 60 MG capsule      fluticasone (FLONASE) 50 MCG/ACT nasal spray      incobotulinumtoxinA (XEOMIN) 100 units SOLR injection Inject 300 u per physician as directed 3 each 0   LORazepam (ATIVAN) 2 MG tablet TAKE 1 TABLET BY MOUTH AT BEDTIME AS NEEDED FOR INSOMNIA  0   meloxicam (MOBIC)  15 MG tablet TAKE 1 TABLET BY  MOUTH EVERY DAY 30 tablet 3   omeprazole (PRILOSEC) 40 MG capsule Take 1 capsule (40 mg total) by mouth as needed.  0   SUMAtriptan (IMITREX) 50 MG tablet TAKE 1 TABLET BY MOUTH EVERY 2 HOURS AS NEEDED FOR MIGRAINE MAY REPEAT IN 2 HRS IF HEADACHE PERSISTS 9 tablet 1   tiZANidine (ZANAFLEX) 4 MG tablet Take 1 tablet (4 mg total) by mouth 3 (three) times daily as needed. (Patient taking differently: Take 4 mg by mouth 3 (three) times daily as needed. Pts 1/2 at hs) 40 tablet 1   Vitamin D, Ergocalciferol, (DRISDOL) 50000 UNITS CAPS capsule Take 50,000 Units by mouth every 7 (seven) days.     Current Facility-Administered Medications  Medication Dose Route Frequency Provider Last Rate Last Admin   incobotulinumtoxinA (XEOMIN) 100 units injection 300 Units  300 Units Intramuscular Q90 days Tat, Octaviano Batty, DO   270 Units at 08/14/21 1005   incobotulinumtoxinA (XEOMIN) 100 units injection 300 Units  300 Units Intramuscular Q90 days Tat, Octaviano Batty, DO   290 Units at 12/25/21 0853   No results found.  Review of Systems:   A ROS was performed including pertinent positives and negatives as documented in the HPI.  Physical Exam :   Constitutional: NAD and appears stated age Neurological: Alert and oriented Psych: Appropriate affect and cooperative There were no vitals taken for this visit.   Comprehensive Musculoskeletal Exam:    Soft tissue edema of the left knee noted without any significant effusion, erythema, or ecchymosis.  Mild tenderness to palpation over patella.  No significant medial or lateral joint line tenderness.  Full range of motion with flexion and extension with mild crepitus.  No instability with varus or valgus stress.  Negative Lachman.  Imaging:   Xray (left knee 4 views): No evidence of fracture or dislocation.  Osteophytes noted off the medial joint line and patella.  Joint space is well-maintained and consistent throughout.   I personally  reviewed and interpreted the radiographs.   Assessment:   72 y.o. female with worsened left knee pain after a fall directly on the knee 2 days ago.  X-rays do appear negative for fracture so I believe this is consistent with a mild bone contusion.  Encouraged to continue icing as well as anti-inflammatories as needed.  She is scheduled to see Dr. Magnus Ivan next week to go over recent left knee MRI results and discuss future treatment options.  All other questions addressed at this time.  Plan :    -Follow-up with Dr. Magnus Ivan as planned on 6/24 for MRI review     I personally saw and evaluated the patient, and participated in the management and treatment plan.  Hazle Nordmann, PA-C Orthopedics  This document was dictated using Conservation officer, historic buildings. A reasonable attempt at proof reading has been made to minimize errors.

## 2022-07-19 ENCOUNTER — Other Ambulatory Visit: Payer: Self-pay

## 2022-07-19 ENCOUNTER — Encounter: Payer: Self-pay | Admitting: Orthopaedic Surgery

## 2022-07-19 ENCOUNTER — Telehealth: Payer: Self-pay

## 2022-07-19 ENCOUNTER — Ambulatory Visit: Payer: Medicare HMO | Admitting: Orthopaedic Surgery

## 2022-07-19 DIAGNOSIS — M1712 Unilateral primary osteoarthritis, left knee: Secondary | ICD-10-CM | POA: Diagnosis not present

## 2022-07-19 DIAGNOSIS — M545 Low back pain, unspecified: Secondary | ICD-10-CM | POA: Diagnosis not present

## 2022-07-19 DIAGNOSIS — M542 Cervicalgia: Secondary | ICD-10-CM | POA: Diagnosis not present

## 2022-07-19 DIAGNOSIS — M25562 Pain in left knee: Secondary | ICD-10-CM | POA: Diagnosis not present

## 2022-07-19 NOTE — Progress Notes (Signed)
The patient comes in today to go over MRI of her left knee.  We had provide a steroid injection in that left knee which temporize her pain only shortly.  She has had a fall since we saw her last as well.  She is 72 years old and active.  She reports some locking catching the knee but certainly pain with activities and weightbearing.  I did review the recent x-rays of her left knee and they were negative for any type of fracture.  Her left knee does have some bruising from her more recent fall.  There is slight valgus malalignment of the knee.  There is no effusion today and some lateral joint line tenderness.  There is pain with flexion extension of her left knee.   The MRI of her left knee does show some areas of significant cartilage thinning of the lateral compartment of her left knee with some degenerative lateral meniscal tearing and patellofemoral arthritic changes.  At this point I recommended a combination of outpatient physical therapy for her left knee with strengthening and any modalities that can help improve her balance as well as decrease the pain in that left knee.  She is also is appropriate candidate for hyaluronic acid to treat the pain from osteoarthritis in the left knee.  Will see if we get this ordered for her as well.  She agrees with this treatment plan.  I will see her back in 4 weeks to hopefully see how she is done from a therapy standpoint and to hopefully place hyaluronic acid in the left knee to treat the pain from osteoarthritis.  This patient is diagnosed with osteoarthritis of the knee(s).    Radiographs show evidence of joint space narrowing, osteophytes, subchondral sclerosis and/or subchondral cysts.  This patient has knee pain which interferes with functional and activities of daily living.    This patient has experienced inadequate response, adverse effects and/or intolerance with conservative treatments such as acetaminophen, NSAIDS, topical creams, physical therapy  or regular exercise, knee bracing and/or weight loss.   This patient has experienced inadequate response or has a contraindication to intra articular steroid injections for at least 3 months.   This patient is not scheduled to have a total knee replacement within 6 months of starting treatment with viscosupplementation.

## 2022-07-19 NOTE — Telephone Encounter (Signed)
Left knee gel injection ?

## 2022-07-20 NOTE — Telephone Encounter (Signed)
Submitted for VOB for Euflexxa left knee  

## 2022-07-21 NOTE — Telephone Encounter (Signed)
VOB received. Requires PA

## 2022-07-22 ENCOUNTER — Other Ambulatory Visit: Payer: Self-pay

## 2022-07-22 ENCOUNTER — Encounter: Payer: Self-pay | Admitting: Rehabilitative and Restorative Service Providers"

## 2022-07-22 ENCOUNTER — Ambulatory Visit: Payer: Medicare HMO | Admitting: Rehabilitative and Restorative Service Providers"

## 2022-07-22 DIAGNOSIS — R262 Difficulty in walking, not elsewhere classified: Secondary | ICD-10-CM | POA: Diagnosis not present

## 2022-07-22 DIAGNOSIS — M6281 Muscle weakness (generalized): Secondary | ICD-10-CM | POA: Diagnosis not present

## 2022-07-22 DIAGNOSIS — G8929 Other chronic pain: Secondary | ICD-10-CM

## 2022-07-22 DIAGNOSIS — M25562 Pain in left knee: Secondary | ICD-10-CM

## 2022-07-22 NOTE — Therapy (Signed)
OUTPATIENT PHYSICAL THERAPY EVALUATION   Patient Name: Theresa Huff MRN: [id number] DOB:[birth year], 72 y.o., female Today's Date: 07/22/2022  END OF SESSION:  PT End of Session - 07/22/22 0925     Visit Number 1    Number of Visits 20    Date for PT Re-Evaluation 09/30/22    Authorization Type [employer] Medicare $20 copay    Progress Note Due on Visit 10    PT Start Time 0930    PT Stop Time 1004    PT Time Calculation (min) 34 min    Activity Tolerance Patient tolerated treatment well    Behavior During Therapy [facility] for tasks assessed/performed             Past Medical History:  Diagnosis Date   Allergic rhinitis due to pollen    Allergy    Anemia    with pregnancy   Atypical mole 05/08/1998   Right Lower Back   Atypical mole 06/28/2006   Right Thigh (slight)   Atypical mole 06/28/2006   Right Inner Shin (slight to moderate)   Atypical mole 06/28/2006   Right Shin Sup (slight to moderate)   Atypical mole 06/28/2006   Right Shin Inf (slight to moderate) [facility])   Atypical mole 09/07/2006   Right Inner Left Lower (slight to moderate)   Atypical mole 09/07/2006   Left Outer Eye (mild)   Breast cancer (HCC)    Clark level II melanoma (HCC) 03/10/1998   Right Upper Arm (excision)   Cough    Depressive disorder, not elsewhere classified    Family history of melanoma    GERD (gastroesophageal reflux disease)    Hot flashes    Insomnia, unspecified    Melanoma in situ (HCC) 12/15/2005   Right Outer Shin (excision)   Melanoma in situ (HCC) 10/07/2010   Left Elbow   Melanoma in situ (HCC) 09/22/2011   Left Upper Arm (excision)   Melanoma in situ (HCC) 10/24/2017   Left Lower Shin [facility])   Melanoma of skin (HCC) 12/27/2017   Personal history of melanoma in-situ    Plantar fasciitis    Radiation 09/09/14-10/17/14   Left chestwall/supraclav.   Restless leg syndrome 09/09/2010   SCCA (squamous cell carcinoma) of skin 05/27/2014   Left Lower Arm, inf (in situ)  (curet and 5FU)   Skin cancer    Wears glasses    Weight gain    Past Surgical History:  Procedure Laterality Date   COLONOSCOPY     MASTECTOMY W/ SENTINEL NODE BIOPSY Left 03/07/2014   Procedure: LEFT TOTAL MASTECTOMY WITH LEFT SENTINEL LYMPH NODE BIOPSY;  Surgeon: Emelia Loron, MD;  Location: [facility];  Service: General;  Laterality: Left;   MELANOMA EXCISION     right leg, left arm, right arm (several from each area)   PORT-A-CATH REMOVAL N/A 12/04/2014   Procedure: REMOVAL PORT-A-CATH;  Surgeon: Emelia Loron, MD;  Location: [facility];  Service: General;  Laterality: N/A;   PORTACATH PLACEMENT Right 04/01/2014   Procedure: INSERTION PORT-A-CATH;  Surgeon: Emelia Loron, MD;  Location: [facility] OR;  Service: General;  Laterality: Right;   TONSILLECTOMY     Patient Active Problem List   Diagnosis Date Noted   Cervical dystonia 11/25/2020   Monoallelic mutation of CDKN2A gene 01/27/2018   Genetic testing 01/27/2018   Melanoma of skin (HCC) 12/27/2017   Family history of melanoma    Personal history of melanoma in-situ    Essential tremor 07/19/2017  Migraine 05/06/2014   Breast cancer of upper-outer quadrant of left female breast (HCC) 02/06/2014   OSA (obstructive sleep apnea) 09/09/2010   Restless leg syndrome 09/09/2010   Depressive disorder, not elsewhere classified    Asthma, cough variant    Insomnia, unspecified    Allergic rhinitis due to pollen     PCP: Tally Joe, MD  REFERRING PROVIDER: Kathryne Hitch, MD  REFERRING DIAG: [id number] (ICD-10-CM) - Acute pain of left knee M17.12 (ICD-10-CM) - Unilateral primary   THERAPY DIAG:  Chronic pain of left knee  Muscle weakness (generalized)  Difficulty in walking, not elsewhere classified  Rationale for Evaluation and Treatment: Rehabilitation  ONSET DATE: 01/2022 worsening of chronic symptoms  SUBJECTIVE:   SUBJECTIVE STATEMENT: She reported feeling knee pain  with various movements.  Insidious onset of symptoms over time.  She indicated having discomfort and complaints and can still do activity but its " bothersome."  Reported WB trouble and giving way.  Pt indicated sleeping with pillow under knee to help sleeping.   Reported a recent fall onto knee, unsure if that worsened any knee symptoms.   PERTINENT HISTORY: History of breast cancer, GERD, melanomas.   PAIN:  NPRS scale: at worst 9/10, at current 5-6/10, at best 0/10 Pain location: Lt knee  Pain description: sharp, dull, achy Aggravating factors: walking, bending knee, WB activity, stairs.  Relieving factors: pillow under knee, aleve, reduced activity  PRECAUTIONS: None  WEIGHT BEARING RESTRICTIONS: No  FALLS:  Has patient fallen in last 6 months? Yes 1  Reported no fear of falling.   LIVING ENVIRONMENT: Lives in: House/apartment Stairs: multilevel house, two rails and one rail.   Stairs for entry to house.   OCCUPATION: no work  PLOF: Independent, reading, painting  PATIENT GOALS: Reduce pain, be able to walk for exercise.   Could have access to gym in future.    OBJECTIVE:   IMAGING 07/22/2022 review of MD report:  The MRI of her left knee does show some areas of significant cartilage thinning of the lateral compartment of her left knee with some degenerative lateral meniscal tearing and patellofemoral arthritic changes.   Xrays were negative for fracture.   PATIENT SURVEYS:  07/22/2022 FOTO intake: 52   predicted:  60  COGNITION: 07/22/2022 Overall cognitive status: WFL    SENSATION: 07/22/2022 WFL  EDEMA:  07/22/2022 No specific edema noted visually  MUSCLE LENGTH: 07/22/2022 No specific testing today  POSTURE:  07/22/2022 Mild WB shift to Rt leg in standing with knee flexed slightly on Lt.   PALPATION: 07/22/2022 No specific tenderness to light touch around Lt knee.   LOWER EXTREMITY ROM:   ROM Right 07/22/2022 Left 07/22/2022  Hip flexion    Hip  extension    Hip abduction    Hip adduction    Hip internal rotation    Hip external rotation    Knee flexion  131 AROM in supine heel slide c pain  Knee extension 0 -3 AROM in seated LAQ c pain  Ankle dorsiflexion    Ankle plantarflexion    Ankle inversion    Ankle eversion     (Blank rows = not tested)  LOWER EXTREMITY MMT:  MMT Right 07/22/2022 Left 07/22/2022  Hip flexion 5/5 5/5  Hip extension 4/5 4/5  Hip abduction 4+/5 3/5  Hip adduction    Hip internal rotation    Hip external rotation    Knee flexion 5/5 5/5  Knee extension 5/5 4/5 c pain  Ankle  dorsiflexion 5/5 5/5  Ankle plantarflexion    Ankle inversion    Ankle eversion     (Blank rows = not tested)  LOWER EXTREMITY SPECIAL TESTS:  07/22/2022 No specific testing  FUNCTIONAL TESTS:  07/22/2022 18 inch chair transfer: s UE assist on 1st try with deviation to Rt leg WB.  Lt SLS: 3 seconds moderate aberrant movements c pain Rt SLS: 10 seconds c mild aberrant movements  GAIT: 07/22/2022 Independent ambulation c decreased WB stance on Lt leg.                                                                                                                                                                         TODAY'S TREATMENT                                                                          DATE: 07/22/2022 Therex:    HEP instruction/performance c cues for techniques, handout provided.  Trial set performed of each for comprehension and symptom assessment.  See below for exercise list  PATIENT EDUCATION:  Education details: HEP, POC Person educated: Patient Education method: Explanation, Demonstration, Verbal cues, and Handouts Education comprehension: verbalized understanding, returned demonstration, and verbal cues required  HOME EXERCISE PROGRAM: Access Code: [email or web address] URL: [email or web address] Date: 07/22/2022 Prepared by: Chyrel Masson  Exercises - Clamshell  - 1-2 x daily - 7  x weekly - 2-3 sets - 10 reps - Supine Bridge  - 1-2 x daily - 7 x weekly - 2-3 sets - 10 reps - 2 hold - Sit to Stand  - 3 x daily - 7 x weekly - 1 sets - 10 reps - Seated Quad Set (Mirrored)  - 3-5 x daily - 7 x weekly - 1 sets - 10 reps - 5 hold - Seated Straight Leg Heel Taps  - 1-2 x daily - 7 x weekly - 3 sets - 10 reps  ASSESSMENT:  CLINICAL IMPRESSION: Patient is a 72 y.o. who comes to clinic with complaints of Lt knee pain with mobility, strength and movement coordination deficits that impair their ability to perform usual daily and recreational functional activities without increase difficulty/symptoms at this time.  Patient to benefit from skilled PT services to address impairments and limitations to improve to previous level of function without restriction secondary to condition.   OBJECTIVE IMPAIRMENTS: Abnormal gait, decreased activity tolerance, decreased balance, decreased coordination, decreased endurance, decreased mobility, difficulty walking, decreased ROM, decreased  strength, impaired perceived functional ability, impaired flexibility, improper body mechanics, and pain.   ACTIVITY LIMITATIONS: carrying, lifting, bending, sitting, standing, squatting, sleeping, stairs, transfers, and locomotion level  PARTICIPATION LIMITATIONS: meal prep, cleaning, laundry, interpersonal relationship, and community activity  PERSONAL FACTORS:  History of breast cancer, GERD, melanomas.   are also affecting patient's functional outcome.   REHAB POTENTIAL: Good  CLINICAL DECISION MAKING: Stable/uncomplicated  EVALUATION COMPLEXITY: Low   GOALS: Goals reviewed with patient? Yes  SHORT TERM GOALS: (target date for Short term goals are 3 weeks 08/12/2022)   1.  Patient will demonstrate independent use of home exercise program to maintain progress from in clinic treatments.  Goal status: New  LONG TERM GOALS: (target dates for all long term goals are 10 weeks  09/30/2022 )   1. Patient  will demonstrate/report pain at worst less than or equal to 2/10 to facilitate minimal limitation in daily activity secondary to pain symptoms.  Goal status: New   2. Patient will demonstrate independent use of home exercise program to facilitate ability to maintain/progress functional gains from skilled physical therapy services.  Goal status: New   3. Patient will demonstrate FOTO outcome > or = 60 % to indicate reduced disability due to condition.  Goal status: New   4.  Patient will demonstrate Lt LE MMT 5/5 throughout to faciltiate usual transfers, stairs, squatting at [facility] for daily life.   Goal status: New   5.  Patient will demonstrate ability to walk for exercise s limitation due to symptoms.  Goal status: New   6.  Patient will demonstrate ascending/descending stairs reciprocally s UE assist for community integration.   Goal status: New   7.  Patient will demonstrate/report ability to sleep s waking due to symptoms.  Goal Status: New   PLAN:  PT FREQUENCY: 1-2x/week  PT DURATION: 10 weeks  PLANNED INTERVENTIONS: Therapeutic exercises, Therapeutic activity, Neuro Muscular re-education, Balance training, Gait training, Patient/Family education, Joint mobilization, Stair training, DME instructions, Dry Needling, Electrical stimulation, Traction, Cryotherapy, vasopneumatic deviceMoist heat, Taping, Ultrasound, Ionotophoresis 4mg /ml Dexamethasone, and aquatic therapy, Manual therapy.  All included unless contraindicated  PLAN FOR NEXT SESSION: Review HEP knowledge/results.   Progressive quad strengthening without pain aggravation.   Chyrel Masson, PT, DPT, OCS, ATC 07/22/22  10:07 AM

## 2022-07-22 NOTE — Telephone Encounter (Signed)
PA paperwork states monovisc/orthovisc is preferred. Submitted for this to confirm

## 2022-07-22 NOTE — Telephone Encounter (Signed)
Submitted for VOB for Durolane as well

## 2022-07-23 NOTE — Telephone Encounter (Signed)
PA Completed. Needs signature then I will fax

## 2022-07-26 NOTE — Telephone Encounter (Signed)
PA faxed

## 2022-07-27 ENCOUNTER — Telehealth: Payer: Self-pay

## 2022-07-27 NOTE — Telephone Encounter (Signed)
PA required for durolane, left knee.  PA submitted online through [id number] portal. Durolane is preferred product.

## 2022-07-28 ENCOUNTER — Other Ambulatory Visit (HOSPITAL_COMMUNITY): Payer: Self-pay

## 2022-07-30 ENCOUNTER — Other Ambulatory Visit: Payer: Self-pay

## 2022-07-30 ENCOUNTER — Ambulatory Visit: Payer: Medicare HMO | Admitting: Neurology

## 2022-08-02 ENCOUNTER — Ambulatory Visit: Payer: Medicare HMO | Admitting: Physical Therapy

## 2022-08-02 ENCOUNTER — Encounter: Payer: Self-pay | Admitting: Physical Therapy

## 2022-08-02 ENCOUNTER — Ambulatory Visit: Payer: Medicare HMO | Admitting: Orthopaedic Surgery

## 2022-08-02 DIAGNOSIS — R262 Difficulty in walking, not elsewhere classified: Secondary | ICD-10-CM | POA: Diagnosis not present

## 2022-08-02 DIAGNOSIS — G8929 Other chronic pain: Secondary | ICD-10-CM | POA: Diagnosis not present

## 2022-08-02 DIAGNOSIS — M6281 Muscle weakness (generalized): Secondary | ICD-10-CM | POA: Diagnosis not present

## 2022-08-02 DIAGNOSIS — M25562 Pain in left knee: Secondary | ICD-10-CM | POA: Diagnosis not present

## 2022-08-02 NOTE — Therapy (Signed)
OUTPATIENT PHYSICAL THERAPY TREATMENT   Patient Name: Theresa Huff MRN: [id number] DOB:[birth year], 72 y.o., female Today's Date: 08/02/2022  END OF SESSION:  PT End of Session - 08/02/22 1348     Visit Number 2    Number of Visits 20    Date for PT Re-Evaluation 09/30/22    Authorization Type [employer] $20 copay    Progress Note Due on Visit 10    PT Start Time 1349   late check in   PT Stop Time 1425    PT Time Calculation (min) 36 min    Activity Tolerance Patient tolerated treatment well    Behavior During Therapy [facility] for tasks assessed/performed              Past Medical History:  Diagnosis Date   Allergic rhinitis due to pollen    Allergy    Anemia    with pregnancy   Atypical mole 05/08/1998   Right Lower Back   Atypical mole 06/28/2006   Right Thigh (slight)   Atypical mole 06/28/2006   Right Inner Shin (slight to moderate)   Atypical mole 06/28/2006   Right Shin Sup (slight to moderate)   Atypical mole 06/28/2006   Right Shin Inf (slight to moderate) [facility])   Atypical mole 09/07/2006   Right Inner Left Lower (slight to moderate)   Atypical mole 09/07/2006   Left Outer Eye (mild)   Breast cancer (HCC)    [place] level II melanoma (HCC) 03/10/1998   Right Upper Arm (excision)   Cough    Depressive disorder, not elsewhere classified    Family history of melanoma    GERD (gastroesophageal reflux disease)    Hot flashes    Insomnia, unspecified    Melanoma in situ (HCC) 12/15/2005   Right Outer Shin (excision)   Melanoma in situ (HCC) 10/07/2010   Left Elbow   Melanoma in situ (HCC) 09/22/2011   Left Upper Arm (excision)   Melanoma in situ (HCC) 10/24/2017   Left Lower Shin [facility])   Melanoma of skin (HCC) 12/27/2017   Personal history of melanoma in-situ    Plantar fasciitis    Radiation 09/09/14-10/17/14   Left chestwall/supraclav.   Restless leg syndrome 09/09/2010   SCCA (squamous cell carcinoma) of skin 05/27/2014   Left Lower  Arm, inf (in situ) (curet and 5FU)   Skin cancer    Wears glasses    Weight gain    Past Surgical History:  Procedure Laterality Date   COLONOSCOPY     MASTECTOMY W/ SENTINEL NODE BIOPSY Left 03/07/2014   Procedure: LEFT TOTAL MASTECTOMY WITH LEFT SENTINEL LYMPH NODE BIOPSY;  Surgeon: Emelia Loron, MD;  Location: [facility];  Service: General;  Laterality: Left;   MELANOMA EXCISION     right leg, left arm, right arm (several from each area)   PORT-A-CATH REMOVAL N/A 12/04/2014   Procedure: REMOVAL PORT-A-CATH;  Surgeon: Emelia Loron, MD;  Location: [facility];  Service: General;  Laterality: N/A;   PORTACATH PLACEMENT Right 04/01/2014   Procedure: INSERTION PORT-A-CATH;  Surgeon: Emelia Loron, MD;  Location: [facility] OR;  Service: General;  Laterality: Right;   TONSILLECTOMY     Patient Active Problem List   Diagnosis Date Noted   Cervical dystonia 11/25/2020   Monoallelic mutation of CDKN2A gene 01/27/2018   Genetic testing 01/27/2018   Melanoma of skin (HCC) 12/27/2017   Family history of melanoma    Personal history of melanoma in-situ  Essential tremor 07/19/2017   Migraine 05/06/2014   Breast cancer of upper-outer quadrant of left female breast (HCC) 02/06/2014   OSA (obstructive sleep apnea) 09/09/2010   Restless leg syndrome 09/09/2010   Depressive disorder, not elsewhere classified    Asthma, cough variant    Insomnia, unspecified    Allergic rhinitis due to pollen     PCP: Tally Joe, MD  REFERRING PROVIDER: Kathryne Hitch, MD  REFERRING DIAG: [phone] (ICD-10-CM) - Acute pain of left knee M17.12 (ICD-10-CM) - Unilateral primary   THERAPY DIAG:  Chronic pain of left knee  Muscle weakness (generalized)  Difficulty in walking, not elsewhere classified  Rationale for Evaluation and Treatment: Rehabilitation  ONSET DATE: 01/2022 worsening of chronic symptoms  SUBJECTIVE:  Per eval - She reported feeling knee  pain with various movements.  Insidious onset of symptoms over time.  She indicated having discomfort and complaints and can still do activity but its " bothersome."  Reported WB trouble and giving way.  Pt indicated sleeping with pillow under knee to help sleeping.   Reported a recent fall onto knee, unsure if that worsened any knee symptoms.   SUBJECTIVE STATEMENT: 08/02/2022 Pt states she hasn't been able to do HEP due to life stressors, husband having to be hospitalized. Pt denies any significant changes in symptoms or new updates    PERTINENT HISTORY: History of breast cancer, GERD, melanomas.   PAIN:  08/02/2022 5/10 L knee pain Eval -  NPRS scale: at worst 9/10, at current 5-6/10, at best 0/10 Pain location: Lt knee  Pain description: sharp, dull, achy Aggravating factors: walking, bending knee, WB activity, stairs.  Relieving factors: pillow under knee, aleve, reduced activity  PRECAUTIONS: None  WEIGHT BEARING RESTRICTIONS: No  FALLS:  Has patient fallen in last 6 months? Yes 1  Reported no fear of falling.   LIVING ENVIRONMENT: Lives in: House/apartment Stairs: multilevel house, two rails and one rail.   Stairs for entry to house.   OCCUPATION: no work  PLOF: Independent, reading, painting  PATIENT GOALS: Reduce pain, be able to walk for exercise.   Could have access to gym in future.    OBJECTIVE: (objective measures completed at initial evaluation unless otherwise dated)   IMAGING 07/22/2022 review of MD report:  The MRI of her left knee does show some areas of significant cartilage thinning of the lateral compartment of her left knee with some degenerative lateral meniscal tearing and patellofemoral arthritic changes.   Xrays were negative for fracture.   PATIENT SURVEYS:  07/22/2022 FOTO intake: 52   predicted:  60  COGNITION: 07/22/2022 Overall cognitive status: WFL    SENSATION: 07/22/2022 WFL  EDEMA:  07/22/2022 No specific edema noted  visually  MUSCLE LENGTH: 07/22/2022 No specific testing today  POSTURE:  07/22/2022 Mild WB shift to Rt leg in standing with knee flexed slightly on Lt.   PALPATION: 07/22/2022 No specific tenderness to light touch around Lt knee.   LOWER EXTREMITY ROM:   ROM Right 07/22/2022 Left 07/22/2022  Hip flexion    Hip extension    Hip abduction    Hip adduction    Hip internal rotation    Hip external rotation    Knee flexion  131 AROM in supine heel slide c pain  Knee extension 0 -3 AROM in seated LAQ c pain  Ankle dorsiflexion    Ankle plantarflexion    Ankle inversion    Ankle eversion     (Blank rows = not tested)  LOWER EXTREMITY MMT:  MMT Right 07/22/2022 Left 07/22/2022  Hip flexion 5/5 5/5  Hip extension 4/5 4/5  Hip abduction 4+/5 3/5  Hip adduction    Hip internal rotation    Hip external rotation    Knee flexion 5/5 5/5  Knee extension 5/5 4/5 c pain  Ankle dorsiflexion 5/5 5/5  Ankle plantarflexion    Ankle inversion    Ankle eversion     (Blank rows = not tested)  LOWER EXTREMITY SPECIAL TESTS:  07/22/2022 No specific testing  FUNCTIONAL TESTS:  07/22/2022 18 inch chair transfer: s UE assist on 1st try with deviation to Rt leg WB.  Lt SLS: 3 seconds moderate aberrant movements c pain Rt SLS: 10 seconds c mild aberrant movements  GAIT: 07/22/2022 Independent ambulation c decreased WB stance on Lt leg.                                                                                                                                                                         TODAY'S TREATMENT                                                                           [facility] Adult PT Treatment:                                                DATE: 08/02/22 Therapeutic Exercise: STS from chair 2x10 no UE support  Bridge 2x10 cues for breath control  Supine SLR 2x8 BIL cues for control  Supine clam red band x10 Seated hip ER w/ ball as fulcrum x10 red band Seated  hip IR w/ ball as fulcrum x10 red band  Triple extension seated into ball, 2x10 cues for comfortable contraction and form Standing TKE ball at wall 2x10 cues for comfortable contraction and form  HEP update + education/handout   DATE: 07/22/2022 Therex:    HEP instruction/performance c cues for techniques, handout provided.  Trial set performed of each for comprehension and symptom assessment.  See below for exercise list  PATIENT EDUCATION:  Education details: HEP, POC Person educated: Patient Education method: Explanation, Demonstration, Verbal cues, and Handouts Education comprehension: verbalized understanding, returned demonstration, and verbal cues required  HOME EXERCISE PROGRAM: Access Code: [email or web address] URL: [email or web address] Date: 08/02/2022 Prepared by: Fransisco Hertz  Exercises - Supine  Bridge  - 1-2 x daily - 7 x weekly - 2-3 sets - 10 reps - 2 hold - Sit to Stand  - 3 x daily - 7 x weekly - 1 sets - 10 reps - Seated Quad Set (Mirrored)  - 3-5 x daily - 7 x weekly - 1 sets - 10 reps - 5 hold - Seated Hip Abduction with Resistance  - 2-3 x daily - 7 x weekly - 1 sets - 10 reps - Small Range Straight Leg Raise  - 2-3 x daily - 7 x weekly - 1 sets - 10 reps  ASSESSMENT:  CLINICAL IMPRESSION: 08/02/2022 Pt arrives w/ 5/10 pain, reports difficulty w/ HEP due to life stressors. Today tolerates activity fairly well, most aggravation with direct quad exercise although this is transient and resolves with rest. Cues as above, tendency towards improved tolerance for exercises with repetition. No adverse events, denies any overt changes in pain on departure. Recommend continuing along current POC in order to address relevant deficits and improve functional tolerance. Pt departs today's session in no acute distress, all voiced questions/concerns addressed appropriately from PT perspective.    Per eval - Patient is a 72 y.o. who comes to clinic with complaints of Lt knee  pain with mobility, strength and movement coordination deficits that impair their ability to perform usual daily and recreational functional activities without increase difficulty/symptoms at this time.  Patient to benefit from skilled PT services to address impairments and limitations to improve to previous level of function without restriction secondary to condition.   OBJECTIVE IMPAIRMENTS: Abnormal gait, decreased activity tolerance, decreased balance, decreased coordination, decreased endurance, decreased mobility, difficulty walking, decreased ROM, decreased strength, impaired perceived functional ability, impaired flexibility, improper body mechanics, and pain.   ACTIVITY LIMITATIONS: carrying, lifting, bending, sitting, standing, squatting, sleeping, stairs, transfers, and locomotion level  PARTICIPATION LIMITATIONS: meal prep, cleaning, laundry, interpersonal relationship, and community activity  PERSONAL FACTORS:  History of breast cancer, GERD, melanomas.   are also affecting patient's functional outcome.   REHAB POTENTIAL: Good  CLINICAL DECISION MAKING: Stable/uncomplicated  EVALUATION COMPLEXITY: Low   GOALS: Goals reviewed with patient? Yes  SHORT TERM GOALS: (target date for Short term goals are 3 weeks 08/12/2022)   1.  Patient will demonstrate independent use of home exercise program to maintain progress from in clinic treatments.  Goal status: New  LONG TERM GOALS: (target dates for all long term goals are 10 weeks  09/30/2022 )   1. Patient will demonstrate/report pain at worst less than or equal to 2/10 to facilitate minimal limitation in daily activity secondary to pain symptoms.  Goal status: New   2. Patient will demonstrate independent use of home exercise program to facilitate ability to maintain/progress functional gains from skilled physical therapy services.  Goal status: New   3. Patient will demonstrate FOTO outcome > or = 60 % to indicate reduced  disability due to condition.  Goal status: New   4.  Patient will demonstrate Lt LE MMT 5/5 throughout to faciltiate usual transfers, stairs, squatting at [facility] for daily life.   Goal status: New   5.  Patient will demonstrate ability to walk for exercise s limitation due to symptoms.  Goal status: New   6.  Patient will demonstrate ascending/descending stairs reciprocally s UE assist for community integration.   Goal status: New   7.  Patient will demonstrate/report ability to sleep s waking due to symptoms.  Goal Status: New   PLAN:  PT  FREQUENCY: 1-2x/week  PT DURATION: 10 weeks  PLANNED INTERVENTIONS: Therapeutic exercises, Therapeutic activity, Neuro Muscular re-education, Balance training, Gait training, Patient/Family education, Joint mobilization, Stair training, DME instructions, Dry Needling, Electrical stimulation, Traction, Cryotherapy, vasopneumatic deviceMoist heat, Taping, Ultrasound, Ionotophoresis 4mg /ml Dexamethasone, and aquatic therapy, Manual therapy.  All included unless contraindicated  PLAN FOR NEXT SESSION: review/update HEP PRN. Anticipate benefit of continued open/closed chain quad strengthening within pt tolerance.   Ashley Murrain PT, DPT 08/02/2022 4:06 PM

## 2022-08-04 ENCOUNTER — Other Ambulatory Visit: Payer: Self-pay

## 2022-08-04 DIAGNOSIS — M1712 Unilateral primary osteoarthritis, left knee: Secondary | ICD-10-CM

## 2022-08-05 ENCOUNTER — Ambulatory Visit: Payer: Medicare HMO | Admitting: Rehabilitative and Restorative Service Providers"

## 2022-08-05 ENCOUNTER — Encounter: Payer: Self-pay | Admitting: Rehabilitative and Restorative Service Providers"

## 2022-08-05 DIAGNOSIS — M25562 Pain in left knee: Secondary | ICD-10-CM

## 2022-08-05 DIAGNOSIS — M542 Cervicalgia: Secondary | ICD-10-CM | POA: Diagnosis not present

## 2022-08-05 DIAGNOSIS — G8929 Other chronic pain: Secondary | ICD-10-CM

## 2022-08-05 DIAGNOSIS — R262 Difficulty in walking, not elsewhere classified: Secondary | ICD-10-CM

## 2022-08-05 DIAGNOSIS — M545 Low back pain, unspecified: Secondary | ICD-10-CM | POA: Diagnosis not present

## 2022-08-05 DIAGNOSIS — M6281 Muscle weakness (generalized): Secondary | ICD-10-CM | POA: Diagnosis not present

## 2022-08-05 NOTE — Therapy (Signed)
OUTPATIENT PHYSICAL THERAPY TREATMENT   Patient Name: Theresa Huff MRN: [id number] DOB:1950-03-11, 72 y.o., female Today's Date: 08/05/2022  END OF SESSION:  PT End of Session - 08/05/22 1519     Visit Number 3    Number of Visits 20    Date for PT Re-Evaluation 09/30/22    Authorization Type [employer] $20 copay    Progress Note Due on Visit 10    PT Start Time 1516    PT Stop Time 1555    PT Time Calculation (min) 39 min    Activity Tolerance Patient tolerated treatment well    Behavior During Therapy [facility] for tasks assessed/performed               Past Medical History:  Diagnosis Date   Allergic rhinitis due to pollen    Allergy    Anemia    with pregnancy   Atypical mole 05/08/1998   Right Lower Back   Atypical mole 06/28/2006   Right Thigh (slight)   Atypical mole 06/28/2006   Right Inner Shin (slight to moderate)   Atypical mole 06/28/2006   Right Shin Sup (slight to moderate)   Atypical mole 06/28/2006   Right Shin Inf (slight to moderate) [facility] Glow)   Atypical mole 09/07/2006   Right Inner Left Lower (slight to moderate)   Atypical mole 09/07/2006   Left Outer Eye (mild)   Breast cancer (HCC)    Clark level II melanoma (HCC) 03/10/1998   Right Upper Arm (excision)   Cough    Depressive disorder, not elsewhere classified    Family history of melanoma    GERD (gastroesophageal reflux disease)    Hot flashes    Insomnia, unspecified    Melanoma in situ (HCC) 12/15/2005   Right Outer Shin (excision)   Melanoma in situ (HCC) 10/07/2010   Left Elbow   Melanoma in situ (HCC) 09/22/2011   Left Upper Arm (excision)   Melanoma in situ (HCC) 10/24/2017   Left Lower Shin [facility])   Melanoma of skin (HCC) 12/27/2017   Personal history of melanoma in-situ    Plantar fasciitis    Radiation 09/09/14-10/17/14   Left chestwall/supraclav.   Restless leg syndrome 09/09/2010   SCCA (squamous cell carcinoma) of skin 05/27/2014   Left Lower Arm, inf (in  situ) (curet and 5FU)   Skin cancer    Wears glasses    Weight gain    Past Surgical History:  Procedure Laterality Date   COLONOSCOPY     MASTECTOMY W/ SENTINEL NODE BIOPSY Left 03/07/2014   Procedure: LEFT TOTAL MASTECTOMY WITH LEFT SENTINEL LYMPH NODE BIOPSY;  Surgeon: Emelia Loron, MD;  Location: [facility];  Service: General;  Laterality: Left;   MELANOMA EXCISION     right leg, left arm, right arm (several from each area)   PORT-A-CATH REMOVAL N/A 12/04/2014   Procedure: REMOVAL PORT-A-CATH;  Surgeon: Emelia Loron, MD;  Location: [facility];  Service: General;  Laterality: N/A;   PORTACATH PLACEMENT Right 04/01/2014   Procedure: INSERTION PORT-A-CATH;  Surgeon: Emelia Loron, MD;  Location: [facility];  Service: General;  Laterality: Right;   TONSILLECTOMY     Patient Active Problem List   Diagnosis Date Noted   Cervical dystonia 11/25/2020   Monoallelic mutation of CDKN2A gene 01/27/2018   Genetic testing 01/27/2018   Melanoma of skin (HCC) 12/27/2017   Family history of melanoma    Personal history of melanoma in-situ    Essential tremor 07/19/2017  Migraine 05/06/2014   Breast cancer of upper-outer quadrant of left female breast (HCC) 02/06/2014   OSA (obstructive sleep apnea) 09/09/2010   Restless leg syndrome 09/09/2010   Depressive disorder, not elsewhere classified    Asthma, cough variant    Insomnia, unspecified    Allergic rhinitis due to pollen     PCP: Tally Joe, MD  REFERRING PROVIDER: Kathryne Hitch, MD  REFERRING DIAG: [phone] (ICD-10-CM) - Acute pain of left knee M17.12 (ICD-10-CM) - Unilateral primary   THERAPY DIAG:  Chronic pain of left knee  Muscle weakness (generalized)  Difficulty in walking, not elsewhere classified  Rationale for Evaluation and Treatment: Rehabilitation  ONSET DATE: 01/2022 worsening of chronic symptoms  SUBJECTIVE:   SUBJECTIVE STATEMENT: She reported doing a  little bit of HEP (mainly sitting items).     PERTINENT HISTORY: History of breast cancer, GERD, melanomas.   PAIN:   NPRS scale: no pain upon arrival/today.  Reported 6-7/10 at worst since eval.  Pain location: Lt knee  Pain description: sharp, dull, achy Aggravating factors: walking, bending knee, WB activity, stairs.  Relieving factors: pillow under knee, aleve, reduced activity  PRECAUTIONS: None  WEIGHT BEARING RESTRICTIONS: No  FALLS:  Has patient fallen in last 6 months? Yes 1  Reported no fear of falling.   LIVING ENVIRONMENT: Lives in: House/apartment Stairs: multilevel house, two rails and one rail.   Stairs for entry to house.   OCCUPATION: no work  PLOF: Independent, reading, painting  PATIENT GOALS: Reduce pain, be able to walk for exercise.   Could have access to gym in future.    OBJECTIVE: (objective measures completed at initial evaluation unless otherwise dated)   IMAGING 07/22/2022 review of MD report:  The MRI of her left knee does show some areas of significant cartilage thinning of the lateral compartment of her left knee with some degenerative lateral meniscal tearing and patellofemoral arthritic changes.   Xrays were negative for fracture.   PATIENT SURVEYS:  07/22/2022 FOTO intake: 52   predicted:  60  COGNITION: 07/22/2022 Overall cognitive status: WFL    SENSATION: 07/22/2022 WFL  EDEMA:  07/22/2022 No specific edema noted visually  MUSCLE LENGTH: 07/22/2022 No specific testing today  POSTURE:  07/22/2022 Mild WB shift to Rt leg in standing with knee flexed slightly on Lt.   PALPATION: 07/22/2022 No specific tenderness to light touch around Lt knee.   LOWER EXTREMITY ROM:   ROM Right 07/22/2022 Left 07/22/2022  Hip flexion    Hip extension    Hip abduction    Hip adduction    Hip internal rotation    Hip external rotation    Knee flexion  131 AROM in supine heel slide c pain  Knee extension 0 -3 AROM in seated LAQ c pain   Ankle dorsiflexion    Ankle plantarflexion    Ankle inversion    Ankle eversion     (Blank rows = not tested)  LOWER EXTREMITY MMT:  MMT Right 07/22/2022 Left 07/22/2022  Hip flexion 5/5 5/5  Hip extension 4/5 4/5  Hip abduction 4+/5 3/5  Hip adduction    Hip internal rotation    Hip external rotation    Knee flexion 5/5 5/5  Knee extension 5/5 4/5 c pain  Ankle dorsiflexion 5/5 5/5  Ankle plantarflexion    Ankle inversion    Ankle eversion     (Blank rows = not tested)  LOWER EXTREMITY SPECIAL TESTS:  07/22/2022 No specific testing  FUNCTIONAL TESTS:  08/05/2022:  Lt SLS:  30 seconds with mild aberrant movement.   07/22/2022 18 inch chair transfer: s UE assist on 1st try with deviation to Rt leg WB.  Lt SLS: 3 seconds moderate aberrant movements c pain Rt SLS: 10 seconds c mild aberrant movements  GAIT: 07/22/2022 Independent ambulation c decreased WB stance on Lt leg.                                                                                                                                                                         TODAY'S TREATMENT                                                                             DATE: 08/05/22 Therapeutic Exercise: Nustep Lvl 6 8 mins UE/LE Leg press double leg 62 lbs x 15, single leg 2 x 10 bilateral 31 lbs  Lateral step down 4 inch 2 x 10 bilaterally  Seated SLR Lt and Rt x 15  each c quad set focus  Neuro Re-ed Tandem stance on foam 1 min x 1 bilateral c occasional HHA on bars Tandem ambulation fwd/back 6 ft in // bars on foam with occasional HHA x 5 each way   TODAY'S TREATMENT                                                                             DATE: 08/02/22 Therapeutic Exercise: STS from chair 2x10 no UE support  Bridge 2x10 cues for breath control  Supine SLR 2x8 BIL cues for control  Supine clam red band x10 Seated hip ER w/ ball as fulcrum x10 red band Seated hip IR w/ ball as fulcrum x10 red band   Triple extension seated into ball, 2x10 cues for comfortable contraction and form Standing TKE ball at wall 2x10 cues for comfortable contraction and form  HEP update + education/handout   TODAY'S TREATMENT  DATE: 07/22/2022 Therex:    HEP instruction/performance c cues for techniques, handout provided.  Trial set performed of each for comprehension and symptom assessment.  See below for exercise list  PATIENT EDUCATION:  Education details: HEP, POC Person educated: Patient Education method: Explanation, Demonstration, Verbal cues, and Handouts Education comprehension: verbalized understanding, returned demonstration, and verbal cues required  HOME EXERCISE PROGRAM: Access Code: [email or web address] URL: [email or web address] Date: 08/02/2022 Prepared by: Fransisco Hertz  Exercises - Supine Bridge  - 1-2 x daily - 7 x weekly - 2-3 sets - 10 reps - 2 hold - Sit to Stand  - 3 x daily - 7 x weekly - 1 sets - 10 reps - Seated Quad Set (Mirrored)  - 3-5 x daily - 7 x weekly - 1 sets - 10 reps - 5 hold - Seated Hip Abduction with Resistance  - 2-3 x daily - 7 x weekly - 1 sets - 10 reps - Small Range Straight Leg Raise  - 2-3 x daily - 7 x weekly - 1 sets - 10 reps  ASSESSMENT:  CLINICAL IMPRESSION: Fair performance on compliant surface balance today.  Pt to benefit from progressive strengthening and balance improvements with continued skilled PT services indicated.   OBJECTIVE IMPAIRMENTS: Abnormal gait, decreased activity tolerance, decreased balance, decreased coordination, decreased endurance, decreased mobility, difficulty walking, decreased ROM, decreased strength, impaired perceived functional ability, impaired flexibility, improper body mechanics, and pain.   ACTIVITY LIMITATIONS: carrying, lifting, bending, sitting, standing, squatting, sleeping, stairs, transfers, and locomotion level  PARTICIPATION  LIMITATIONS: meal prep, cleaning, laundry, interpersonal relationship, and community activity  PERSONAL FACTORS:  History of breast cancer, GERD, melanomas.   are also affecting patient's functional outcome.   REHAB POTENTIAL: Good  CLINICAL DECISION MAKING: Stable/uncomplicated  EVALUATION COMPLEXITY: Low   GOALS: Goals reviewed with patient? Yes  SHORT TERM GOALS: (target date for Short term goals are 3 weeks 08/12/2022)   1.  Patient will demonstrate independent use of home exercise program to maintain progress from in clinic treatments.  Goal status: New  LONG TERM GOALS: (target dates for all long term goals are 10 weeks  09/30/2022 )   1. Patient will demonstrate/report pain at worst less than or equal to 2/10 to facilitate minimal limitation in daily activity secondary to pain symptoms.  Goal status: New   2. Patient will demonstrate independent use of home exercise program to facilitate ability to maintain/progress functional gains from skilled physical therapy services.  Goal status: New   3. Patient will demonstrate FOTO outcome > or = 60 % to indicate reduced disability due to condition.  Goal status: New   4.  Patient will demonstrate Lt LE MMT 5/5 throughout to faciltiate usual transfers, stairs, squatting at [facility] for daily life.   Goal status: New   5.  Patient will demonstrate ability to walk for exercise s limitation due to symptoms.  Goal status: New   6.  Patient will demonstrate ascending/descending stairs reciprocally s UE assist for community integration.   Goal status: New   7.  Patient will demonstrate/report ability to sleep s waking due to symptoms.  Goal Status: New   PLAN:  PT FREQUENCY: 1-2x/week  PT DURATION: 10 weeks  PLANNED INTERVENTIONS: Therapeutic exercises, Therapeutic activity, Neuro Muscular re-education, Balance training, Gait training, Patient/Family education, Joint mobilization, Stair training, DME instructions, Dry  Needling, Electrical stimulation, Traction, Cryotherapy, vasopneumatic deviceMoist heat, Taping, Ultrasound, Ionotophoresis 4mg /ml Dexamethasone, and aquatic therapy, Manual therapy.  All included unless contraindicated  PLAN  FOR NEXT SESSION: Progressive WB strengthening as tolerated.  Compliant surface /dynamic balance improvements.    Chyrel Masson, PT, DPT, OCS, ATC 08/05/22  3:51 PM

## 2022-08-06 ENCOUNTER — Ambulatory Visit: Payer: Medicare HMO | Admitting: Neurology

## 2022-08-06 DIAGNOSIS — G243 Spasmodic torticollis: Secondary | ICD-10-CM | POA: Diagnosis not present

## 2022-08-06 MED ORDER — ONABOTULINUMTOXINA 100 UNITS IJ SOLR
300.0000 [IU] | Freq: Once | INTRAMUSCULAR | Status: AC
Start: 2022-08-06 — End: 2022-08-06
  Administered 2022-08-06: 290 [IU] via INTRAMUSCULAR

## 2022-08-06 NOTE — Addendum Note (Signed)
Addended by: Ila Mcgill C on: 08/06/2022 04:06 PM   Modules accepted: Orders

## 2022-08-06 NOTE — Procedures (Signed)
[facility]   Procedure Note Botox  Attending: Dr. Lurena Joiner Perry Brucato  Preoperative Diagnosis(es): Cervical Dystonia  Result History  Doing well  Consent obtained from: The patient Benefits discussed included, but were not limited to decreased muscle tightness, increased joint range of motion, and decreased pain.  Risk discussed included, but were not limited pain and discomfort, bleeding, bruising, excessive weakness, venous thrombosis, muscle atrophy and dysphagia.  A copy of the patient medication guide was given to the patient which explains the blackbox warning.  Patients identity and treatment sites confirmed Yes.  .  Details of Procedure: Skin was cleaned with alcohol.  A 30 gauge, 25mm  needle was introduced to the target muscle, except for posterior splenius where 27 gauge, 1.5 inch needle used.   Prior to injection, the needle plunger was aspirated to make sure the needle was not within a blood vessel.  There was no blood retrieved on aspiration.    Following is a summary of the muscles injected  And the amount of Botulinum toxin used:   Dilution 0.9% preservative free saline mixed with 100 u Botox type A (onobotunlinum toxin type A) to make 10 U per 0.1cc  Injections  Location Left  Right Units Number of sites        Sternocleidomastoid  60/20 80 2  Splenius Capitus, posterior approach 100  100 1  Splenius Capitus, lateral approach 40  40 1  Levator Scapulae      Trapezius 20/20/10 20 70         TOTAL UNITS:   290    Agent: Xeomin.  3 vials of onobotulinum toxin type A were used, each containing 100 units and freshly diluted with 1 mL of sterile, non-preserved saline   Total injected (Units): 290  Total wasted (Units): 10 waste   Pt tolerated procedure well without complications.   Reinjection is anticipated in 3 months.

## 2022-08-09 ENCOUNTER — Ambulatory Visit: Payer: Medicare HMO | Admitting: Rehabilitative and Restorative Service Providers"

## 2022-08-09 ENCOUNTER — Encounter: Payer: Self-pay | Admitting: Rehabilitative and Restorative Service Providers"

## 2022-08-09 DIAGNOSIS — M25562 Pain in left knee: Secondary | ICD-10-CM

## 2022-08-09 DIAGNOSIS — R262 Difficulty in walking, not elsewhere classified: Secondary | ICD-10-CM | POA: Diagnosis not present

## 2022-08-09 DIAGNOSIS — M6281 Muscle weakness (generalized): Secondary | ICD-10-CM | POA: Diagnosis not present

## 2022-08-09 DIAGNOSIS — G8929 Other chronic pain: Secondary | ICD-10-CM

## 2022-08-09 DIAGNOSIS — M545 Low back pain, unspecified: Secondary | ICD-10-CM | POA: Diagnosis not present

## 2022-08-09 DIAGNOSIS — M542 Cervicalgia: Secondary | ICD-10-CM | POA: Diagnosis not present

## 2022-08-09 NOTE — Therapy (Addendum)
OUTPATIENT PHYSICAL THERAPY TREATMENT   Patient Name: Theresa Huff MRN: [id number] DOB:05-Aug-1950, 72 y.o., female Today's Date: 08/09/2022  END OF SESSION:  PT End of Session - 08/09/22 1100     Visit Number 4    Number of Visits 20    Date for PT Re-Evaluation 09/30/22    Authorization Type [employer] $20 copay    Progress Note Due on Visit 10    PT Start Time 1058    PT Stop Time 1137    PT Time Calculation (min) 39 min    Activity Tolerance Patient tolerated treatment well    Behavior During Therapy [facility] for tasks assessed/performed                Past Medical History:  Diagnosis Date   Allergic rhinitis due to pollen    Allergy    Anemia    with pregnancy   Atypical mole 05/08/1998   Right Lower Back   Atypical mole 06/28/2006   Right Thigh (slight)   Atypical mole 06/28/2006   Right Inner Shin (slight to moderate)   Atypical mole 06/28/2006   Right Shin Sup (slight to moderate)   Atypical mole 06/28/2006   Right Shin Inf (slight to moderate) [facility])   Atypical mole 09/07/2006   Right Inner Left Lower (slight to moderate)   Atypical mole 09/07/2006   Left Outer Eye (mild)   Breast cancer (HCC)    [place]ark level II melanoma (HCC) 03/10/1998   Right Upper Arm (excision)   Cough    Depressive disorder, not elsewhere classified    Family history of melanoma    GERD (gastroesophageal reflux disease)    Hot flashes    Insomnia, unspecified    Melanoma in situ (HCC) 12/15/2005   Right Outer Shin (excision)   Melanoma in situ (HCC) 10/07/2010   Left Elbow   Melanoma in situ (HCC) 09/22/2011   Left Upper Arm (excision)   Melanoma in situ (HCC) 10/24/2017   Left Lower Shin [facility])   Melanoma of skin (HCC) 12/27/2017   Personal history of melanoma in-situ    Plantar fasciitis    Radiation 09/09/14-10/17/14   Left chestwall/supraclav.   Restless leg syndrome 09/09/2010   SCCA (squamous cell carcinoma) of skin 05/27/2014   Left Lower Arm, inf (in  situ) (curet and 5FU)   Skin cancer    Wears glasses    Weight gain    Past Surgical History:  Procedure Laterality Date   COLONOSCOPY     MASTECTOMY W/ SENTINEL NODE BIOPSY Left 03/07/2014   Procedure: LEFT TOTAL MASTECTOMY WITH LEFT SENTINEL LYMPH NODE BIOPSY;  Surgeon: Emelia Loron, MD;  Location: [facility];  Service: General;  Laterality: Left;   MELANOMA EXCISION     right leg, left arm, right arm (several from each area)   PORT-A-CATH REMOVAL N/A 12/04/2014   Procedure: REMOVAL PORT-A-CATH;  Surgeon: Emelia Loron, MD;  Location: [facility];  Service: General;  Laterality: N/A;   PORTACATH PLACEMENT Right 04/01/2014   Procedure: INSERTION PORT-A-CATH;  Surgeon: Emelia Loron, MD;  Location: [facility] OR;  Service: General;  Laterality: Right;   TONSILLECTOMY     Patient Active Problem List   Diagnosis Date Noted   Cervical dystonia 11/25/2020   Monoallelic mutation of CDKN2A gene 01/27/2018   Genetic testing 01/27/2018   Melanoma of skin (HCC) 12/27/2017   Family history of melanoma    Personal history of melanoma in-situ    Essential tremor  07/19/2017   Migraine 05/06/2014   Breast cancer of upper-outer quadrant of left female breast (HCC) 02/06/2014   OSA (obstructive sleep apnea) 09/09/2010   Restless leg syndrome 09/09/2010   Depressive disorder, not elsewhere classified    Asthma, cough variant    Insomnia, unspecified    Allergic rhinitis due to pollen     PCP: Tally Joe, MD  REFERRING PROVIDER: Kathryne Hitch, MD  REFERRING DIAG: [phone] (ICD-10-CM) - Acute pain of left knee M17.12 (ICD-10-CM) - Unilateral primary   THERAPY DIAG:  Chronic pain of left knee  Muscle weakness (generalized)  Difficulty in walking, not elsewhere classified  Rationale for Evaluation and Treatment: Rehabilitation  ONSET DATE: 01/2022 worsening of chronic symptoms  SUBJECTIVE:   SUBJECTIVE STATEMENT: She indicated a little  muscle sore after last visit but not having knee pain.    PERTINENT HISTORY: History of breast cancer, GERD, melanomas.   PAIN:   NPRS scale: 0/10 Pain location: Lt knee  Pain description: sharp, dull, achy Aggravating factors: walking, bending knee, WB activity, stairs.  Relieving factors: pillow under knee, aleve, reduced activity  PRECAUTIONS: None  WEIGHT BEARING RESTRICTIONS: No  FALLS:  Has patient fallen in last 6 months? Yes 1  Reported no fear of falling.   LIVING ENVIRONMENT: Lives in: House/apartment Stairs: multilevel house, two rails and one rail.   Stairs for entry to house.   OCCUPATION: no work  PLOF: Independent, reading, painting  PATIENT GOALS: Reduce pain, be able to walk for exercise.   Could have access to gym in future.    OBJECTIVE: (objective measures completed at initial evaluation unless otherwise dated)   IMAGING 07/22/2022 review of MD report:  The MRI of her left knee does show some areas of significant cartilage thinning of the lateral compartment of her left knee with some degenerative lateral meniscal tearing and patellofemoral arthritic changes.   Xrays were negative for fracture.   PATIENT SURVEYS:  07/22/2022 FOTO intake: 52   predicted:  60  COGNITION: 07/22/2022 Overall cognitive status: WFL    SENSATION: 07/22/2022 WFL  EDEMA:  07/22/2022 No specific edema noted visually  MUSCLE LENGTH: 07/22/2022 No specific testing today  POSTURE:  07/22/2022 Mild WB shift to Rt leg in standing with knee flexed slightly on Lt.   PALPATION: 07/22/2022 No specific tenderness to light touch around Lt knee.   LOWER EXTREMITY ROM:   ROM Right 07/22/2022 Left 07/22/2022  Hip flexion    Hip extension    Hip abduction    Hip adduction    Hip internal rotation    Hip external rotation    Knee flexion  131 AROM in supine heel slide c pain  Knee extension 0 -3 AROM in seated LAQ c pain  Ankle dorsiflexion    Ankle plantarflexion     Ankle inversion    Ankle eversion     (Blank rows = not tested)  LOWER EXTREMITY MMT:  MMT Right 07/22/2022 Left 07/22/2022 Left 08/09/2022  Hip flexion 5/5 5/5   Hip extension 4/5 4/5   Hip abduction 4+/5 3/5   Hip adduction     Hip internal rotation     Hip external rotation     Knee flexion 5/5 5/5   Knee extension 5/5 4/5 c pain 5/5  Ankle dorsiflexion 5/5 5/5   Ankle plantarflexion     Ankle inversion     Ankle eversion      (Blank rows = not tested)  LOWER EXTREMITY SPECIAL TESTS:  07/22/2022 No specific testing  FUNCTIONAL TESTS:  08/05/2022:  Lt SLS:  30 seconds with mild aberrant movement.   07/22/2022 18 inch chair transfer: s UE assist on 1st try with deviation to Rt leg WB.  Lt SLS: 3 seconds moderate aberrant movements c pain Rt SLS: 10 seconds c mild aberrant movements  GAIT: 07/22/2022 Independent ambulation c decreased WB stance on Lt leg.                                                                                                                                                                         TODAY'S TREATMENT                                                                             DATE: 08/09/2022 Therapeutic Exercise: UBE LE only seat 8 lvl 2.5 8 mins  Leg press double leg 67 lbs x 15, single leg 2 x 15 bilateral 37 lbs  Lateral step down 6 inch 2 x 10 bilaterally   Neuro Re-ed SLS c contralateral leg step over 9 inch hurdle x 15 bilateral with occasional HHA  Tandem stance on foam 1 min x 2 bilateral c occasional HHA on bars SLS on black mat with contralateral leg corner touching x 6 each corner , performed bilaterally   TODAY'S TREATMENT                                                                             DATE: 08/05/22 Therapeutic Exercise: Nustep Lvl 6 8 mins UE/LE Leg press double leg 62 lbs x 15, single leg 2 x 10 bilateral 31 lbs  Lateral step down 4 inch 2 x 10 bilaterally  Seated SLR Lt and Rt x 15  each c quad set  focus  Neuro Re-ed Tandem stance on foam 1 min x 1 bilateral c occasional HHA on bars Tandem ambulation fwd/back 6 ft in // bars on foam with occasional HHA x 5 each way   TODAY'S TREATMENT  DATE: 08/02/22 Therapeutic Exercise: STS from chair 2x10 no UE support  Bridge 2x10 cues for breath control  Supine SLR 2x8 BIL cues for control  Supine clam red band x10 Seated hip ER w/ ball as fulcrum x10 red band Seated hip IR w/ ball as fulcrum x10 red band  Triple extension seated into ball, 2x10 cues for comfortable contraction and form Standing TKE ball at wall 2x10 cues for comfortable contraction and form  HEP update + education/handout   TODAY'S TREATMENT                                                                             DATE: 07/22/2022 Therex:    HEP instruction/performance c cues for techniques, handout provided.  Trial set performed of each for comprehension and symptom assessment.  See below for exercise list  PATIENT EDUCATION:  Education details: HEP, POC Person educated: Patient Education method: Explanation, Demonstration, Verbal cues, and Handouts Education comprehension: verbalized understanding, returned demonstration, and verbal cues required  HOME EXERCISE PROGRAM: Access Code: [email or web address] URL: [email or web address] Date: 08/02/2022 Prepared by: Fransisco Hertz  Exercises - Supine Bridge  - 1-2 x daily - 7 x weekly - 2-3 sets - 10 reps - 2 hold - Sit to Stand  - 3 x daily - 7 x weekly - 1 sets - 10 reps - Seated Quad Set (Mirrored)  - 3-5 x daily - 7 x weekly - 1 sets - 10 reps - 5 hold - Seated Hip Abduction with Resistance  - 2-3 x daily - 7 x weekly - 1 sets - 10 reps - Small Range Straight Leg Raise  - 2-3 x daily - 7 x weekly - 1 sets - 10 reps  ASSESSMENT:  CLINICAL IMPRESSION: Good reporting on symptom response to increased strengthening/WB activity.  Pt to  continue to benefit from progressive strengthening and balance improvements for functional activity tolerance improvements.    OBJECTIVE IMPAIRMENTS: Abnormal gait, decreased activity tolerance, decreased balance, decreased coordination, decreased endurance, decreased mobility, difficulty walking, decreased ROM, decreased strength, impaired perceived functional ability, impaired flexibility, improper body mechanics, and pain.   ACTIVITY LIMITATIONS: carrying, lifting, bending, sitting, standing, squatting, sleeping, stairs, transfers, and locomotion level  PARTICIPATION LIMITATIONS: meal prep, cleaning, laundry, interpersonal relationship, and community activity  PERSONAL FACTORS:  History of breast cancer, GERD, melanomas.   are also affecting patient's functional outcome.   REHAB POTENTIAL: Good  CLINICAL DECISION MAKING: Stable/uncomplicated  EVALUATION COMPLEXITY: Low   GOALS: Goals reviewed with patient? Yes  SHORT TERM GOALS: (target date for Short term goals are 3 weeks 08/12/2022)   1.  Patient will demonstrate independent use of home exercise program to maintain progress from in clinic treatments.  Goal status: Met   LONG TERM GOALS: (target dates for all long term goals are 10 weeks  09/30/2022 )   1. Patient will demonstrate/report pain at worst less than or equal to 2/10 to facilitate minimal limitation in daily activity secondary to pain symptoms.  Goal status: on going 08/09/2022   2. Patient will demonstrate independent use of home exercise program to facilitate ability to maintain/progress functional gains from skilled physical therapy services.  Goal status: on going 08/09/2022   3.  Patient will demonstrate FOTO outcome > or = 60 % to indicate reduced disability due to condition.  Goal status: on going 08/09/2022   4.  Patient will demonstrate Lt LE MMT 5/5 throughout to faciltiate usual transfers, stairs, squatting at [facility] for daily life.   Goal status: on going  08/09/2022   5.  Patient will demonstrate ability to walk for exercise s limitation due to symptoms.  Goal status: on going 08/09/2022   6.  Patient will demonstrate ascending/descending stairs reciprocally s UE assist for community integration.   Goal status: on going 08/09/2022   7.  Patient will demonstrate/report ability to sleep s waking due to symptoms.  Goal Status Met 08/09/2022   PLAN:  PT FREQUENCY: 1-2x/week  PT DURATION: 10 weeks  PLANNED INTERVENTIONS: Therapeutic exercises, Therapeutic activity, Neuro Muscular re-education, Balance training, Gait training, Patient/Family education, Joint mobilization, Stair training, DME instructions, Dry Needling, Electrical stimulation, Traction, Cryotherapy, vasopneumatic deviceMoist heat, Taping, Ultrasound, Ionotophoresis 4mg /ml Dexamethasone, and aquatic therapy, Manual therapy.  All included unless contraindicated  PLAN FOR NEXT SESSION:   Compliant surface balance, dynamic balance improvements.    Chyrel Masson, PT, DPT, OCS, ATC 08/09/22  11:35 AM

## 2022-08-11 ENCOUNTER — Encounter: Payer: Medicare HMO | Admitting: Rehabilitative and Restorative Service Providers"

## 2022-08-16 ENCOUNTER — Encounter: Payer: Self-pay | Admitting: Rehabilitative and Restorative Service Providers"

## 2022-08-16 ENCOUNTER — Ambulatory Visit: Payer: Medicare HMO | Admitting: Rehabilitative and Restorative Service Providers"

## 2022-08-16 DIAGNOSIS — M545 Low back pain, unspecified: Secondary | ICD-10-CM | POA: Diagnosis not present

## 2022-08-16 DIAGNOSIS — G8929 Other chronic pain: Secondary | ICD-10-CM

## 2022-08-16 DIAGNOSIS — M6281 Muscle weakness (generalized): Secondary | ICD-10-CM | POA: Diagnosis not present

## 2022-08-16 DIAGNOSIS — R262 Difficulty in walking, not elsewhere classified: Secondary | ICD-10-CM

## 2022-08-16 DIAGNOSIS — M25562 Pain in left knee: Secondary | ICD-10-CM | POA: Diagnosis not present

## 2022-08-16 DIAGNOSIS — M542 Cervicalgia: Secondary | ICD-10-CM | POA: Diagnosis not present

## 2022-08-16 NOTE — Therapy (Addendum)
OUTPATIENT PHYSICAL THERAPY TREATMENT / DISCHARGE   Patient Name: Theresa Huff MRN: [id number] DOB:[birth year], 72 y.o., female Today's Date: 08/16/2022  END OF SESSION:  PT End of Session - 08/16/22 1002     Visit Number 5    Number of Visits 20    Date for PT Re-Evaluation 09/30/22    Authorization Type [employer] $20 copay    Progress Note Due on Visit 10    PT Start Time 1002    PT Stop Time 1041    PT Time Calculation (min) 39 min    Activity Tolerance Patient tolerated treatment well    Behavior During Therapy [facility] for tasks assessed/performed                 Past Medical History:  Diagnosis Date   Allergic rhinitis due to pollen    Allergy    Anemia    with pregnancy   Atypical mole 05/08/1998   Right Lower Back   Atypical mole 06/28/2006   Right Thigh (slight)   Atypical mole 06/28/2006   Right Inner Shin (slight to moderate)   Atypical mole 06/28/2006   Right Shin Sup (slight to moderate)   Atypical mole 06/28/2006   Right Shin Inf (slight to moderate) [facility])   Atypical mole 09/07/2006   Right Inner Left Lower (slight to moderate)   Atypical mole 09/07/2006   Left Outer Eye (mild)   Breast cancer (HCC)    Clark level II melanoma (HCC) 03/10/1998   Right Upper Arm (excision)   Cough    Depressive disorder, not elsewhere classified    Family history of melanoma    GERD (gastroesophageal reflux disease)    Hot flashes    Insomnia, unspecified    Melanoma in situ (HCC) 12/15/2005   Right Outer Shin (excision)   Melanoma in situ (HCC) 10/07/2010   Left Elbow   Melanoma in situ (HCC) 09/22/2011   Left Upper Arm (excision)   Melanoma in situ (HCC) 10/24/2017   Left Lower Shin [facility])   Melanoma of skin (HCC) 12/27/2017   Personal history of melanoma in-situ    Plantar fasciitis    Radiation 09/09/14-10/17/14   Left chestwall/supraclav.   Restless leg syndrome 09/09/2010   SCCA (squamous cell carcinoma) of skin 05/27/2014   Left Lower  Arm, inf (in situ) (curet and 5FU)   Skin cancer    Wears glasses    Weight gain    Past Surgical History:  Procedure Laterality Date   COLONOSCOPY     MASTECTOMY W/ SENTINEL NODE BIOPSY Left 03/07/2014   Procedure: LEFT TOTAL MASTECTOMY WITH LEFT SENTINEL LYMPH NODE BIOPSY;  Surgeon: Emelia Loron, MD;  Location: [facility];  Service: General;  Laterality: Left;   MELANOMA EXCISION     right leg, left arm, right arm (several from each area)   PORT-A-CATH REMOVAL N/A 12/04/2014   Procedure: REMOVAL PORT-A-CATH;  Surgeon: Emelia Loron, MD;  Location: [facility];  Service: General;  Laterality: N/A;   PORTACATH PLACEMENT Right 04/01/2014   Procedure: INSERTION PORT-A-CATH;  Surgeon: Emelia Loron, MD;  Location: [facility] OR;  Service: General;  Laterality: Right;   TONSILLECTOMY     Patient Active Problem List   Diagnosis Date Noted   Cervical dystonia 11/25/2020   Monoallelic mutation of CDKN2A gene 01/27/2018   Genetic testing 01/27/2018   Melanoma of skin (HCC) 12/27/2017   Family history of melanoma    Personal history of melanoma in-situ  Essential tremor 07/19/2017   Migraine 05/06/2014   Breast cancer of upper-outer quadrant of left female breast (HCC) 02/06/2014   OSA (obstructive sleep apnea) 09/09/2010   Restless leg syndrome 09/09/2010   Depressive disorder, not elsewhere classified    Asthma, cough variant    Insomnia, unspecified    Allergic rhinitis due to pollen     PCP: Tally Joe, MD  REFERRING PROVIDER: Kathryne Hitch, MD  REFERRING DIAG: [phone] (ICD-10-CM) - Acute pain of left knee M17.12 (ICD-10-CM) - Unilateral primary   THERAPY DIAG:  Chronic pain of left knee  Muscle weakness (generalized)  Difficulty in walking, not elsewhere classified  Rationale for Evaluation and Treatment: Rehabilitation  ONSET DATE: 01/2022 worsening of chronic symptoms  SUBJECTIVE:   SUBJECTIVE STATEMENT: She indicated  feeling a little here or there but overall not having pain since last visit. She reported some pressure with sitting on leg.  She reported some difficulty with getting down to pull weeds.  Global rating of change at + 5 quite a bit better.    PERTINENT HISTORY: History of breast cancer, GERD, melanomas.   PAIN:   NPRS scale: 0/10 Pain location: Lt knee  Pain description: sharp, dull, achy Aggravating factors: walking, bending knee, WB activity, stairs.  Relieving factors: pillow under knee, aleve, reduced activity  PRECAUTIONS: None  WEIGHT BEARING RESTRICTIONS: No  FALLS:  Has patient fallen in last 6 months? Yes 1  Reported no fear of falling.   LIVING ENVIRONMENT: Lives in: House/apartment Stairs: multilevel house, two rails and one rail.   Stairs for entry to house.   OCCUPATION: no work  PLOF: Independent, reading, painting  PATIENT GOALS: Reduce pain, be able to walk for exercise.   Could have access to gym in future.    OBJECTIVE: (objective measures completed at initial evaluation unless otherwise dated)   IMAGING 07/22/2022 review of MD report:  The MRI of her left knee does show some areas of significant cartilage thinning of the lateral compartment of her left knee with some degenerative lateral meniscal tearing and patellofemoral arthritic changes.   Xrays were negative for fracture.   PATIENT SURVEYS:  08/16/2022:  FOTO update 63  07/22/2022 FOTO intake: 52   predicted:  60  COGNITION: 07/22/2022 Overall cognitive status: WFL    SENSATION: 07/22/2022 WFL  EDEMA:  07/22/2022 No specific edema noted visually  MUSCLE LENGTH: 07/22/2022 No specific testing today  POSTURE:  07/22/2022 Mild WB shift to Rt leg in standing with knee flexed slightly on Lt.   PALPATION: 07/22/2022 No specific tenderness to light touch around Lt knee.   LOWER EXTREMITY ROM:   ROM Right 07/22/2022 Left 07/22/2022  Hip flexion    Hip extension    Hip abduction    Hip  adduction    Hip internal rotation    Hip external rotation    Knee flexion  131 AROM in supine heel slide c pain  Knee extension 0 -3 AROM in seated LAQ c pain  Ankle dorsiflexion    Ankle plantarflexion    Ankle inversion    Ankle eversion     (Blank rows = not tested)  LOWER EXTREMITY MMT:  MMT Right 07/22/2022 Left 07/22/2022 Left 08/09/2022  Hip flexion 5/5 5/5   Hip extension 4/5 4/5   Hip abduction 4+/5 3/5   Hip adduction     Hip internal rotation     Hip external rotation     Knee flexion 5/5 5/5   Knee extension  5/5 4/5 c pain 5/5  Ankle dorsiflexion 5/5 5/5   Ankle plantarflexion     Ankle inversion     Ankle eversion      (Blank rows = not tested)  LOWER EXTREMITY SPECIAL TESTS:  07/22/2022 No specific testing  FUNCTIONAL TESTS:  08/05/2022:  Lt SLS:  30 seconds with mild aberrant movement.   07/22/2022 18 inch chair transfer: s UE assist on 1st try with deviation to Rt leg WB.  Lt SLS: 3 seconds moderate aberrant movements c pain Rt SLS: 10 seconds c mild aberrant movements  GAIT: 07/22/2022 Independent ambulation c decreased WB stance on Lt leg.                                                                                                                                                                         TODAY'S TREATMENT                                                                             DATE: 08/16/2022 Therapeutic Exercise: Recumbent bike lvl 2 10 mins, seat 3 Leg press double leg 67 lbs x 15, single leg 2 x 15 bilateral 43 lbs  Flight of stairs up/down c reciprocal gait pattern no UE assist .  Pt caught foot on one step going up and had to catch with hands to prevent fall forward onto stairs.  No injury noted.   Review of HEP program and gym based program activity for trial HEP period.   Neuro Re-ed SLS c contralateral leg step over 9 inch hurdle x 15 bilateral with occasional HHA  Tandem stance 1 min x 1 bilateral c occasional HHA on  bars SLS with contralateral leg corner touching x 6 each corner , performed bilaterally  TODAY'S TREATMENT                                                                             DATE: 08/09/2022 Therapeutic Exercise: UBE LE only seat 8 lvl 2.5 8 mins  Leg press double leg 67 lbs x 15, single leg 2 x 15 bilateral 37 lbs  Lateral step down 6 inch 2 x  10 bilaterally   Neuro Re-ed SLS c contralateral leg step over 9 inch hurdle x 15 bilateral with occasional HHA  Tandem stance on foam 1 min x 2 bilateral c occasional HHA on bars SLS on black man with contralateral leg corner touching x 6 each corner , performed bilaterally   TODAY'S TREATMENT                                                                             DATE: 08/05/22 Therapeutic Exercise: Nustep Lvl 6 8 mins UE/LE Leg press double leg 62 lbs x 15, single leg 2 x 10 bilateral 31 lbs  Lateral step down 4 inch 2 x 10 bilaterally  Seated SLR Lt and Rt x 15  each c quad set focus  Neuro Re-ed Tandem stance on foam 1 min x 1 bilateral c occasional HHA on bars Tandem ambulation fwd/back 6 ft in // bars on foam with occasional HHA x 5 each way   TODAY'S TREATMENT                                                                             DATE: 08/02/22 Therapeutic Exercise: STS from chair 2x10 no UE support  Bridge 2x10 cues for breath control  Supine SLR 2x8 BIL cues for control  Supine clam red band x10 Seated hip ER w/ ball as fulcrum x10 red band Seated hip IR w/ ball as fulcrum x10 red band  Triple extension seated into ball, 2x10 cues for comfortable contraction and form Standing TKE ball at wall 2x10 cues for comfortable contraction and form  HEP update + education/handout   PATIENT EDUCATION:  Education details: HEP, POC Person educated: Patient Education method: Programmer, multimedia, Facilities manager, Verbal cues, and Handouts Education comprehension: verbalized understanding, returned demonstration, and verbal cues  required  HOME EXERCISE PROGRAM: Access Code: [email or web address] URL: [email or web address] Date: 08/16/2022 Prepared by: Chyrel Masson  Exercises - Supine Bridge  - 1-2 x daily - 7 x weekly - 2-3 sets - 10 reps - 2 hold - Sit to Stand  - 3 x daily - 7 x weekly - 1 sets - 10 reps - Seated Quad Set (Mirrored)  - 3-5 x daily - 7 x weekly - 1 sets - 10 reps - 5 hold - Seated Straight Leg Heel Taps  - 1-2 x daily - 7 x weekly - 3 sets - 10 reps - Standing Hip Hiking  - 1-2 x daily - 7 x weekly - 1 sets - 10 reps - 5 hold - Tandem Stance  - 1 x daily - 7 x weekly - 1 sets - 3-5 reps - 30 hold  ASSESSMENT:  CLINICAL IMPRESSION: Good reporting on symptom response to increased strengthening/WB activity.  Pt to continue to benefit from progressive strengthening and balance improvements for functional activity tolerance improvements.    OBJECTIVE IMPAIRMENTS: Abnormal gait, decreased activity tolerance, decreased balance, decreased coordination, decreased endurance,  decreased mobility, difficulty walking, decreased ROM, decreased strength, impaired perceived functional ability, impaired flexibility, improper body mechanics, and pain.   ACTIVITY LIMITATIONS: carrying, lifting, bending, sitting, standing, squatting, sleeping, stairs, transfers, and locomotion level  PARTICIPATION LIMITATIONS: meal prep, cleaning, laundry, interpersonal relationship, and community activity  PERSONAL FACTORS:  History of breast cancer, GERD, melanomas.   are also affecting patient's functional outcome.   REHAB POTENTIAL: Good  CLINICAL DECISION MAKING: Stable/uncomplicated  EVALUATION COMPLEXITY: Low   GOALS: Goals reviewed with patient? Yes  SHORT TERM GOALS: (target date for Short term goals are 3 weeks 08/12/2022)   1.  Patient will demonstrate independent use of home exercise program to maintain progress from in clinic treatments.  Goal status: Met   LONG TERM GOALS: (target dates for all long  term goals are 10 weeks  09/30/2022 )   1. Patient will demonstrate/report pain at worst less than or equal to 2/10 to facilitate minimal limitation in daily activity secondary to pain symptoms.  Goal status: on going 08/09/2022   2. Patient will demonstrate independent use of home exercise program to facilitate ability to maintain/progress functional gains from skilled physical therapy services.  Goal status: on going 08/09/2022   3. Patient will demonstrate FOTO outcome > or = 60 % to indicate reduced disability due to condition.  Goal status: met 08/16/2022   4.  Patient will demonstrate Lt LE MMT 5/5 throughout to faciltiate usual transfers, stairs, squatting at [facility] for daily life.   Goal status: mostly met 08/16/2022   5.  Patient will demonstrate ability to walk for exercise s limitation due to symptoms.  Goal status: met 08/16/2022   6.  Patient will demonstrate ascending/descending stairs reciprocally s UE assist for community integration.   Goal status: on going 08/09/2022   7.  Patient will demonstrate/report ability to sleep s waking due to symptoms.  Goal Status Met 08/09/2022   PLAN:  PT FREQUENCY: 1-2x/week  PT DURATION: 10 weeks  PLANNED INTERVENTIONS: Therapeutic exercises, Therapeutic activity, Neuro Muscular re-education, Balance training, Gait training, Patient/Family education, Joint mobilization, Stair training, DME instructions, Dry Needling, Electrical stimulation, Traction, Cryotherapy, vasopneumatic deviceMoist heat, Taping, Ultrasound, Ionotophoresis 4mg /ml Dexamethasone, and aquatic therapy, Manual therapy.  All included unless contraindicated  PLAN FOR NEXT SESSION:   Trial HEP until return if necessary.   Chyrel Masson, PT, DPT, OCS, ATC 08/16/22  10:43 AM  PHYSICAL THERAPY DISCHARGE SUMMARY  Visits from Start of Care: 5  Current functional level related to goals / functional outcomes: See note   Remaining deficits: See note   Education /  Equipment: HEP  Patient goals were  mostly met . Patient is being discharged due to not returning since the last visit.  Chyrel Masson, PT, DPT, OCS, ATC 10/06/22  3:21 PM

## 2022-08-17 ENCOUNTER — Encounter: Payer: Self-pay | Admitting: Orthopaedic Surgery

## 2022-08-17 ENCOUNTER — Ambulatory Visit: Payer: Medicare HMO | Admitting: Orthopaedic Surgery

## 2022-08-17 DIAGNOSIS — M1712 Unilateral primary osteoarthritis, left knee: Secondary | ICD-10-CM

## 2022-08-17 MED ORDER — SODIUM HYALURONATE 60 MG/3ML IX PRSY
60.0000 mg | PREFILLED_SYRINGE | INTRA_ARTICULAR | Status: AC | PRN
Start: 2022-08-17 — End: 2022-08-17
  Administered 2022-08-17: 60 mg via INTRA_ARTICULAR

## 2022-08-17 NOTE — Progress Notes (Signed)
   Procedure Note  Patient: Theresa Huff             Date of Birth: 28-Dec-1950           MRN: [id number]             Visit Date: 08/17/2022  Procedures: Visit Diagnoses:  1. Unilateral primary osteoarthritis, left knee     Large Joint Inj: L knee on 08/17/2022 10:50 AM Indications: diagnostic evaluation and pain Details: 22 G 1.5 in needle, superolateral approach  Arthrogram: No  Medications: 60 mg Sodium Hyaluronate 60 MG/3ML Outcome: tolerated well, no immediate complications Procedure, treatment alternatives, risks and benefits explained, specific risks discussed. Consent was given by the patient. Immediately prior to procedure a time out was called to verify the correct patient, procedure, equipment, support staff and site/side marked as required. Patient was prepped and draped in the usual sterile fashion.    The patient comes in today for scheduled hyaluronic acid injection in her left knee to treat the pain from osteoarthritis of that knee.  This is with Durolane.  She has been having some knee pain but it is stable.  There is no knee joint effusion today.  I did place Durolane in her left knee today without difficulty.  She knows to wait a minimum of 6 months between the size of injections.  If this does not help we can try steroid again versus considering knee replacement surgery.  All questions and concerns were addressed and answered.  Lot #[id number]

## 2022-08-18 ENCOUNTER — Encounter: Payer: Medicare HMO | Admitting: Rehabilitative and Restorative Service Providers"

## 2022-08-18 ENCOUNTER — Encounter: Payer: Self-pay | Admitting: Orthopaedic Surgery

## 2022-08-23 ENCOUNTER — Encounter: Payer: Medicare HMO | Admitting: Physical Therapy

## 2022-08-23 ENCOUNTER — Other Ambulatory Visit (HOSPITAL_BASED_OUTPATIENT_CLINIC_OR_DEPARTMENT_OTHER): Payer: Self-pay | Admitting: Family Medicine

## 2022-08-23 DIAGNOSIS — J309 Allergic rhinitis, unspecified: Secondary | ICD-10-CM | POA: Diagnosis not present

## 2022-08-23 DIAGNOSIS — G250 Essential tremor: Secondary | ICD-10-CM | POA: Diagnosis not present

## 2022-08-23 DIAGNOSIS — F339 Major depressive disorder, recurrent, unspecified: Secondary | ICD-10-CM | POA: Diagnosis not present

## 2022-08-23 DIAGNOSIS — M545 Low back pain, unspecified: Secondary | ICD-10-CM | POA: Diagnosis not present

## 2022-08-23 DIAGNOSIS — R5383 Other fatigue: Secondary | ICD-10-CM | POA: Diagnosis not present

## 2022-08-23 DIAGNOSIS — K219 Gastro-esophageal reflux disease without esophagitis: Secondary | ICD-10-CM | POA: Diagnosis not present

## 2022-08-23 DIAGNOSIS — G4700 Insomnia, unspecified: Secondary | ICD-10-CM | POA: Diagnosis not present

## 2022-08-23 DIAGNOSIS — M542 Cervicalgia: Secondary | ICD-10-CM | POA: Diagnosis not present

## 2022-08-23 DIAGNOSIS — M810 Age-related osteoporosis without current pathological fracture: Secondary | ICD-10-CM | POA: Diagnosis not present

## 2022-08-23 DIAGNOSIS — M25572 Pain in left ankle and joints of left foot: Secondary | ICD-10-CM | POA: Diagnosis not present

## 2022-08-23 DIAGNOSIS — E559 Vitamin D deficiency, unspecified: Secondary | ICD-10-CM | POA: Diagnosis not present

## 2022-08-23 DIAGNOSIS — Z853 Personal history of malignant neoplasm of breast: Secondary | ICD-10-CM | POA: Diagnosis not present

## 2022-08-23 DIAGNOSIS — E7800 Pure hypercholesterolemia, unspecified: Secondary | ICD-10-CM

## 2022-08-25 ENCOUNTER — Encounter: Payer: Medicare HMO | Admitting: Rehabilitative and Restorative Service Providers"

## 2022-08-30 ENCOUNTER — Encounter: Payer: Medicare HMO | Admitting: Rehabilitative and Restorative Service Providers"

## 2022-10-05 ENCOUNTER — Ambulatory Visit (HOSPITAL_BASED_OUTPATIENT_CLINIC_OR_DEPARTMENT_OTHER)
Admission: RE | Admit: 2022-10-05 | Discharge: 2022-10-05 | Disposition: A | Discharge status: Home / Self Care | Payer: Medicare HMO | Source: Ambulatory Visit | Attending: Family Medicine | Admitting: Family Medicine

## 2022-10-05 DIAGNOSIS — E7800 Pure hypercholesterolemia, unspecified: Secondary | ICD-10-CM

## 2022-10-11 ENCOUNTER — Ambulatory Visit: Payer: Medicare HMO | Admitting: Orthopaedic Surgery

## 2022-10-13 DIAGNOSIS — L820 Inflamed seborrheic keratosis: Secondary | ICD-10-CM | POA: Diagnosis not present

## 2022-10-13 DIAGNOSIS — L608 Other nail disorders: Secondary | ICD-10-CM | POA: Diagnosis not present

## 2022-10-13 DIAGNOSIS — D239 Other benign neoplasm of skin, unspecified: Secondary | ICD-10-CM | POA: Diagnosis not present

## 2022-10-13 DIAGNOSIS — L814 Other melanin hyperpigmentation: Secondary | ICD-10-CM | POA: Diagnosis not present

## 2022-10-13 DIAGNOSIS — Z85820 Personal history of malignant melanoma of skin: Secondary | ICD-10-CM | POA: Diagnosis not present

## 2022-10-13 DIAGNOSIS — L570 Actinic keratosis: Secondary | ICD-10-CM | POA: Diagnosis not present

## 2022-10-13 DIAGNOSIS — L821 Other seborrheic keratosis: Secondary | ICD-10-CM | POA: Diagnosis not present

## 2022-10-13 DIAGNOSIS — D1801 Hemangioma of skin and subcutaneous tissue: Secondary | ICD-10-CM | POA: Diagnosis not present

## 2022-10-13 DIAGNOSIS — L578 Other skin changes due to chronic exposure to nonionizing radiation: Secondary | ICD-10-CM | POA: Diagnosis not present

## 2022-10-13 DIAGNOSIS — D485 Neoplasm of uncertain behavior of skin: Secondary | ICD-10-CM | POA: Diagnosis not present

## 2022-10-20 ENCOUNTER — Other Ambulatory Visit: Payer: Self-pay

## 2022-10-28 ENCOUNTER — Other Ambulatory Visit (HOSPITAL_COMMUNITY): Payer: Self-pay

## 2022-10-28 ENCOUNTER — Other Ambulatory Visit: Payer: Self-pay | Admitting: Neurology

## 2022-10-28 ENCOUNTER — Other Ambulatory Visit: Payer: Self-pay

## 2022-10-28 DIAGNOSIS — G243 Spasmodic torticollis: Secondary | ICD-10-CM

## 2022-10-28 NOTE — Progress Notes (Signed)
Specialty Pharmacy Refill Coordination Note  Theresa Huff is a 72 y.o. female contacted today regarding refills of specialty medication(s) Onabotulinumtoxina   Patient requested Courier to Provider Office   Delivery date: 11/09/22   Verified address: [address] [place], [place] [address]   Medication will be filled on 11-08-22. *Pending Refill Request*

## 2022-10-29 ENCOUNTER — Other Ambulatory Visit: Payer: Self-pay

## 2022-10-29 MED ORDER — BOTOX 100 UNITS IJ SOLR
INTRAMUSCULAR | 1 refills | Status: DC
  Filled 2022-10-29: qty 3, 84d supply, fill #0
  Filled 2023-01-25: qty 3, 84d supply, fill #1

## 2022-11-03 ENCOUNTER — Other Ambulatory Visit: Payer: Self-pay

## 2022-11-03 NOTE — Progress Notes (Signed)
Refill received

## 2022-11-08 ENCOUNTER — Other Ambulatory Visit: Payer: Self-pay

## 2022-11-10 DIAGNOSIS — R233 Spontaneous ecchymoses: Secondary | ICD-10-CM | POA: Diagnosis not present

## 2022-11-10 DIAGNOSIS — L608 Other nail disorders: Secondary | ICD-10-CM | POA: Diagnosis not present

## 2022-11-12 ENCOUNTER — Ambulatory Visit: Payer: Medicare HMO | Admitting: Neurology

## 2022-11-12 ENCOUNTER — Other Ambulatory Visit (HOSPITAL_COMMUNITY): Payer: Self-pay

## 2022-11-12 DIAGNOSIS — G243 Spasmodic torticollis: Secondary | ICD-10-CM | POA: Diagnosis not present

## 2022-11-12 MED ORDER — ONABOTULINUMTOXINA 100 UNITS IJ SOLR
300.0000 [IU] | Freq: Once | INTRAMUSCULAR | Status: AC
Start: 2022-11-12 — End: 2022-11-12
  Administered 2022-11-12: 290 [IU] via INTRAMUSCULAR

## 2022-11-12 NOTE — Procedures (Signed)
[facility]   Procedure Note Botox  Attending: Dr. Lurena Joiner Perry Brucato  Preoperative Diagnosis(es): Cervical Dystonia  Result History  Doing well  Consent obtained from: The patient Benefits discussed included, but were not limited to decreased muscle tightness, increased joint range of motion, and decreased pain.  Risk discussed included, but were not limited pain and discomfort, bleeding, bruising, excessive weakness, venous thrombosis, muscle atrophy and dysphagia.  A copy of the patient medication guide was given to the patient which explains the blackbox warning.  Patients identity and treatment sites confirmed Yes.  .  Details of Procedure: Skin was cleaned with alcohol.  A 30 gauge, 25mm  needle was introduced to the target muscle, except for posterior splenius where 27 gauge, 1.5 inch needle used.   Prior to injection, the needle plunger was aspirated to make sure the needle was not within a blood vessel.  There was no blood retrieved on aspiration.    Following is a summary of the muscles injected  And the amount of Botulinum toxin used:   Dilution 0.9% preservative free saline mixed with 100 u Botox type A (onobotunlinum toxin type A) to make 10 U per 0.1cc  Injections  Location Left  Right Units Number of sites        Sternocleidomastoid  60/20 80 2  Splenius Capitus, posterior approach 100  100 1  Splenius Capitus, lateral approach 40  40 1  Levator Scapulae      Trapezius 20/20/10 20 70         TOTAL UNITS:   290    Agent: Xeomin.  3 vials of onobotulinum toxin type A were used, each containing 100 units and freshly diluted with 1 mL of sterile, non-preserved saline   Total injected (Units): 290  Total wasted (Units): 10 waste   Pt tolerated procedure well without complications.   Reinjection is anticipated in 3 months.

## 2022-11-22 ENCOUNTER — Encounter: Payer: Self-pay | Admitting: Genetic Counselor

## 2022-11-22 NOTE — Progress Notes (Signed)
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  The amended report date is August 04, 2022.

## 2022-12-01 ENCOUNTER — Telehealth: Payer: Self-pay | Admitting: Oncology

## 2022-12-01 ENCOUNTER — Other Ambulatory Visit: Payer: Self-pay

## 2022-12-01 NOTE — Telephone Encounter (Signed)
AFT-05 [name] Follow-up Visit Year 8  Called patient on the phone for her follow-up year 8 visit for the [name] study. Patient stated she is doing well with no new events.   Anti-cancer treatment with Letrozole was completed last year in May 2023. Covid-19 Testing/Vaccines: Patient does not remember having Covid in the last year. She just recently had a Covid vaccine in Sept or Oct 2024. She will get another phone call next year for year 87 and then in Oct or Nov 2026 for year 10 when she is called for the follow-up visit then she is ok with scheduling the year 10 blood draw.  The patient had no concerns today and she was thanked for her continued participation in this clinical trial. Adella Nissen Research Specialist 12/01/22

## 2022-12-16 ENCOUNTER — Encounter: Payer: Self-pay | Admitting: Orthopaedic Surgery

## 2022-12-16 ENCOUNTER — Ambulatory Visit: Payer: Medicare HMO | Admitting: Orthopaedic Surgery

## 2022-12-16 DIAGNOSIS — M1712 Unilateral primary osteoarthritis, left knee: Secondary | ICD-10-CM

## 2022-12-16 MED ORDER — METHYLPREDNISOLONE ACETATE 40 MG/ML IJ SUSP
1.0000 mL | INTRAMUSCULAR | Status: AC | PRN
Start: 2022-12-16 — End: 2022-12-16
  Administered 2022-12-16: 1 mL via INTRA_ARTICULAR

## 2022-12-16 MED ORDER — LIDOCAINE HCL 1 % IJ SOLN
3.0000 mL | INTRAMUSCULAR | Status: AC | PRN
Start: 2022-12-16 — End: 2022-12-16
  Administered 2022-12-16: 3 mL

## 2022-12-16 NOTE — Progress Notes (Addendum)
Office Visit Note   Patient: Theresa Huff           Date of Birth: [birth year]           MRN: [id number] Visit Date: 12/16/2022              Requested by: Tally Joe, MD [phone] [place],  [place] [address] PCP: Tally Joe, MD   Assessment & Plan: Visit Diagnoses:  1. Unilateral primary osteoarthritis, left knee     Plan: Patient here for follow-up for knee injection, patient notes significant provide after previous steroid injection would like to go ahead and proceed with another 1.  Will go ahead and do steroid injection today, patient was able and tolerated the procedure well without any difficulties.  Did advise patient that she can make schedule in 3 months and if she has more than 3 months of pain relief she can always cancel that appointment.  Patient advised to continue doing some quadricep strengthening exercises and to follow-up as needed.  Patient is understanding and agreeable with plan.  I have seen the patient and agree with Dr.Rashema Seawright's treatment plan.  We will place a steroid injection in her left knee today.  She knows to wait at least 3 to 4 months between steroid injections.  If this gets to a point where this does not work, she will consider knee replacement surgery.  Follow-Up Instructions: No follow-ups on file.   Orders:  Orders Placed This Encounter  Procedures   Large Joint Inj   No orders of the defined types were placed in this encounter.     Procedures: Large Joint Inj on 12/16/2022 3:48 PM Indications: pain Details: 22 G 1.5 in needle, anteromedial approach Medications: 3 mL lidocaine 1 %; 1 mL methylPREDNISolone acetate 40 MG/ML  PROCEDURE:  Risks & benefits of left knee cortisone injection reviewed. Consent obtained. Time-out completed. Patient prepped and draped in the normal fashion. Area cleansed with alcohol. Ethyl chloride spray used to anesthetize the skin. Solution of 3 mL 1% lidocaine with 1 mL methylprednisolone  (Depo-medrol) 40mg /mL injected into the left knee using a 22-gauge 1.5-inch needle via the anterior lateral approach. Patient tolerated procedure well without any complications. Area covered with adhesive bandage.  Post-procedure care reviewed, all questions answered.        Clinical Data: No additional findings.   Subjective: Chief Complaint  Patient presents with   Left Knee - Follow-up    Patient is here for follow-up of her left knee pain.  Patient had a knee injection approximately 5 months ago and states that she had significant relief from that.  Patient has had over the past few weeks has been having some pain, patient states when she stands for an hour she starts to have some the generalized aching that radiates to the posterior aspect of her knee.  Patient also notes some pain more so at night when she is bending her knee or straightening her knee.  Patient hears and clicking from time to time.  Patient otherwise has no other concerns at this time.  Patient has no trauma to the knee.    Review of Systems   Objective: Vital Signs: There were no vitals taken for this visit.  Physical Exam  Knee, Left: Inspection was negative for erythema, ecchymosis, and effusion. No obvious bony abnormalities or signs of osteophyte development. Palpation yielded no asymmetric warmth; Medial joint line tenderness; No condyle tenderness; Mild patellar tenderness; Noted knee crepitus.  Patellar and quadriceps tendons with TTP over quad tendon, no TTP over patellar, and no tenderness of the pes anserine bursa. No obvious Baker's cyst development. ROM normal in flexion (135 degrees) and extension (0 degrees). Strength 5/5 with knee flexion and extension. Neurovascularly intact bilaterally.   Provocative Testing:    - Patella:   - Patellar grind/compression: POS   - Patellar glide: Appropriate medial/lateral glide without apprehension  - Collateral Ligaments:   - Varus/Valgus (MCL/LCL) Stress  test at 0, 15d: NEG   Ortho Exam  Specialty Comments:  No specialty comments available.  Imaging: No results found.   PMFS History: Patient Active Problem List   Diagnosis Date Noted   Cervical dystonia 11/25/2020   Monoallelic mutation of CDKN2A gene 01/27/2018   Genetic testing 01/27/2018   Melanoma of skin (HCC) 12/27/2017   Family history of melanoma    Personal history of melanoma in-situ    Essential tremor 07/19/2017   Migraine 05/06/2014   Breast cancer of upper-outer quadrant of left female breast (HCC) 02/06/2014   OSA (obstructive sleep apnea) 09/09/2010   Restless leg syndrome 09/09/2010   Depressive disorder, not elsewhere classified    Asthma, cough variant    Insomnia, unspecified    Allergic rhinitis due to pollen    Past Medical History:  Diagnosis Date   Allergic rhinitis due to pollen    Allergy    Anemia    with pregnancy   Atypical mole 05/08/1998   Right Lower Back   Atypical mole 06/28/2006   Right Thigh (slight)   Atypical mole 06/28/2006   Right Inner Shin (slight to moderate)   Atypical mole 06/28/2006   Right Shin Sup (slight to moderate)   Atypical mole 06/28/2006   Right Shin Inf (slight to moderate) [facility])   Atypical mole 09/07/2006   Right Inner Left Lower (slight to moderate)   Atypical mole 09/07/2006   Left Outer Eye (mild)   Breast cancer (HCC)    Clark level II melanoma (HCC) 03/10/1998   Right Upper Arm (excision)   Cough    Depressive disorder, not elsewhere classified    Family history of melanoma    GERD (gastroesophageal reflux disease)    Hot flashes    Insomnia, unspecified    Melanoma in situ (HCC) 12/15/2005   Right Outer Shin (excision)   Melanoma in situ (HCC) 10/07/2010   Left Elbow   Melanoma in situ (HCC) 09/22/2011   Left Upper Arm (excision)   Melanoma in situ (HCC) 10/24/2017   Left Lower Shin [facility])   Melanoma of skin (HCC) 12/27/2017   Personal history of melanoma in-situ    Plantar  fasciitis    Radiation 09/09/14-10/17/14   Left chestwall/supraclav.   Restless leg syndrome 09/09/2010   SCCA (squamous cell carcinoma) of skin 05/27/2014   Left Lower Arm, inf (in situ) (curet and 5FU)   Skin cancer    Wears glasses    Weight gain     Family History  Problem Relation Age of Onset   Emphysema Mother    Heart disease Mother    Heart failure Mother    Alcohol abuse Father    Liver disease Father    Melanoma Brother 30       more than 3, unk exact number   Celiac disease Daughter    Hashimoto's thyroiditis Daughter    Cancer Cousin        maybe had cancer- type unk   Tremor Cousin  Tremor Cousin    Asthma Other        grandson    Past Surgical History:  Procedure Laterality Date   COLONOSCOPY     MASTECTOMY W/ SENTINEL NODE BIOPSY Left 03/07/2014   Procedure: LEFT TOTAL MASTECTOMY WITH LEFT SENTINEL LYMPH NODE BIOPSY;  Surgeon: Emelia Loron, MD;  Location: [facility];  Service: General;  Laterality: Left;   MELANOMA EXCISION     right leg, left arm, right arm (several from each area)   PORT-A-CATH REMOVAL N/A 12/04/2014   Procedure: REMOVAL PORT-A-CATH;  Surgeon: Emelia Loron, MD;  Location: [facility];  Service: General;  Laterality: N/A;   PORTACATH PLACEMENT Right 04/01/2014   Procedure: INSERTION PORT-A-CATH;  Surgeon: Emelia Loron, MD;  Location: [facility];  Service: General;  Laterality: Right;   TONSILLECTOMY     Social History   Occupational History   Occupation: retired    Associate Professor: Sport and exercise psychologist [employer]  Tobacco Use   Smoking status: Former    Current packs/day: 0.00    Average packs/day: 0.2 packs/day for 3.0 years (0.6 ttl pk-yrs)    Types: Cigarettes    Start date: 01/26/1971    Quit date: 01/25/1974    Years since quitting: 48.9   Smokeless tobacco: Never  Vaping Use   Vaping status: Never Used  Substance and Sexual Activity   Alcohol use: Yes    Alcohol/week: 2.0 standard drinks of  alcohol    Types: 2 Glasses of wine per week    Comment: 2 glasses wine per week   Drug use: No   Sexual activity: Never

## 2022-12-16 NOTE — Progress Notes (Signed)
I have seen the patient and agree with Dr. Rosie Fate treatment plan.  The patient is requesting a steroid injection in her left knee today due to pain from osteoarthritis.  It has been at least 30-month since her last steroid injection.  She knows that if he gets to the point where these injections are not helping that we can recommend a joint replacement.

## 2023-01-13 DIAGNOSIS — C50912 Malignant neoplasm of unspecified site of left female breast: Secondary | ICD-10-CM | POA: Diagnosis not present

## 2023-01-25 ENCOUNTER — Other Ambulatory Visit: Payer: Self-pay

## 2023-01-25 NOTE — Progress Notes (Signed)
 Specialty Pharmacy Refill Coordination Note  Theresa Huff is a 72 y.o. female contacted today regarding refills of specialty medication(s) OnabotulinumtoxinA  (Botox )   Patient requested Courier to Provider Office   Delivery date: 02/03/23   Verified address: [address] [place], [place] [address]   Medication will be filled on 01.08.25.

## 2023-01-31 ENCOUNTER — Other Ambulatory Visit: Payer: Self-pay

## 2023-02-02 ENCOUNTER — Other Ambulatory Visit: Payer: Self-pay

## 2023-02-02 ENCOUNTER — Telehealth: Payer: Self-pay | Admitting: Pharmacy Technician

## 2023-02-02 ENCOUNTER — Other Ambulatory Visit (HOSPITAL_COMMUNITY): Payer: Self-pay

## 2023-02-02 NOTE — Progress Notes (Signed)
 Spoke with patient regarding option to call insurance regarding payment plan. Pt opted to pay full amount of $691.76.

## 2023-02-02 NOTE — Telephone Encounter (Signed)
 Called and spoke with patient and she will pay full amount of 691.76. Informed [facility] and they will fill.

## 2023-02-02 NOTE — Telephone Encounter (Signed)
 Pharmacy Patient Advocate Encounter  [facility] is filling BOTOX  200. Currently a quantity of 3 is a 90 day supply and the co-pay is 691.76.   This test claim was processed through [facility]- copay amounts may vary at other pharmacies due to pharmacy/plan contracts, or as the patient moves through the different stages of their insurance plan. Patient may be eligible for a Medicare prescription Payment plan. The patient will need to reach out to their insurance company to enrol in the payment plan to spread out their payments throughout the year, If available.

## 2023-02-11 ENCOUNTER — Encounter: Payer: Self-pay | Admitting: Neurology

## 2023-02-11 ENCOUNTER — Ambulatory Visit: Payer: Medicare HMO | Admitting: Neurology

## 2023-02-11 DIAGNOSIS — G243 Spasmodic torticollis: Secondary | ICD-10-CM

## 2023-02-11 MED ORDER — ONABOTULINUMTOXINA 100 UNITS IJ SOLR
300.0000 [IU] | Freq: Once | INTRAMUSCULAR | Status: AC
Start: 2023-02-11 — End: 2023-02-11
  Administered 2023-02-11: 290 [IU] via INTRAMUSCULAR

## 2023-02-11 NOTE — Procedures (Signed)
[facility]   Procedure Note Botox  Attending: Dr. Lurena Joiner Perry Brucato  Preoperative Diagnosis(es): Cervical Dystonia  Result History  Doing well  Consent obtained from: The patient Benefits discussed included, but were not limited to decreased muscle tightness, increased joint range of motion, and decreased pain.  Risk discussed included, but were not limited pain and discomfort, bleeding, bruising, excessive weakness, venous thrombosis, muscle atrophy and dysphagia.  A copy of the patient medication guide was given to the patient which explains the blackbox warning.  Patients identity and treatment sites confirmed Yes.  .  Details of Procedure: Skin was cleaned with alcohol.  A 30 gauge, 25mm  needle was introduced to the target muscle, except for posterior splenius where 27 gauge, 1.5 inch needle used.   Prior to injection, the needle plunger was aspirated to make sure the needle was not within a blood vessel.  There was no blood retrieved on aspiration.    Following is a summary of the muscles injected  And the amount of Botulinum toxin used:   Dilution 0.9% preservative free saline mixed with 100 u Botox type A (onobotunlinum toxin type A) to make 10 U per 0.1cc  Injections  Location Left  Right Units Number of sites        Sternocleidomastoid  60/20 80 2  Splenius Capitus, posterior approach 100  100 1  Splenius Capitus, lateral approach 40  40 1  Levator Scapulae      Trapezius 20/20/10 20 70         TOTAL UNITS:   290    Agent: Xeomin.  3 vials of onobotulinum toxin type A were used, each containing 100 units and freshly diluted with 1 mL of sterile, non-preserved saline   Total injected (Units): 290  Total wasted (Units): 10 waste   Pt tolerated procedure well without complications.   Reinjection is anticipated in 3 months.

## 2023-02-15 DIAGNOSIS — G250 Essential tremor: Secondary | ICD-10-CM | POA: Diagnosis not present

## 2023-02-15 DIAGNOSIS — E7800 Pure hypercholesterolemia, unspecified: Secondary | ICD-10-CM | POA: Diagnosis not present

## 2023-02-15 DIAGNOSIS — F339 Major depressive disorder, recurrent, unspecified: Secondary | ICD-10-CM | POA: Diagnosis not present

## 2023-02-15 DIAGNOSIS — R1319 Other dysphagia: Secondary | ICD-10-CM | POA: Diagnosis not present

## 2023-02-15 DIAGNOSIS — M810 Age-related osteoporosis without current pathological fracture: Secondary | ICD-10-CM | POA: Diagnosis not present

## 2023-02-15 DIAGNOSIS — K219 Gastro-esophageal reflux disease without esophagitis: Secondary | ICD-10-CM | POA: Diagnosis not present

## 2023-02-15 DIAGNOSIS — Z0000 Encounter for general adult medical examination without abnormal findings: Secondary | ICD-10-CM | POA: Diagnosis not present

## 2023-02-15 DIAGNOSIS — E559 Vitamin D deficiency, unspecified: Secondary | ICD-10-CM | POA: Diagnosis not present

## 2023-02-15 DIAGNOSIS — M545 Low back pain, unspecified: Secondary | ICD-10-CM | POA: Diagnosis not present

## 2023-02-15 DIAGNOSIS — M25572 Pain in left ankle and joints of left foot: Secondary | ICD-10-CM | POA: Diagnosis not present

## 2023-02-15 DIAGNOSIS — G4700 Insomnia, unspecified: Secondary | ICD-10-CM | POA: Diagnosis not present

## 2023-02-15 DIAGNOSIS — Z853 Personal history of malignant neoplasm of breast: Secondary | ICD-10-CM | POA: Diagnosis not present

## 2023-02-18 DIAGNOSIS — Z1211 Encounter for screening for malignant neoplasm of colon: Secondary | ICD-10-CM | POA: Diagnosis not present

## 2023-03-06 NOTE — Progress Notes (Signed)
This encounter was created in error - please disregard.

## 2023-03-07 DIAGNOSIS — H35372 Puckering of macula, left eye: Secondary | ICD-10-CM | POA: Diagnosis not present

## 2023-03-07 DIAGNOSIS — H353111 Nonexudative age-related macular degeneration, right eye, early dry stage: Secondary | ICD-10-CM | POA: Diagnosis not present

## 2023-03-17 ENCOUNTER — Encounter: Payer: Self-pay | Admitting: Orthopaedic Surgery

## 2023-03-17 ENCOUNTER — Ambulatory Visit: Payer: Medicare HMO | Admitting: Orthopaedic Surgery

## 2023-03-17 DIAGNOSIS — G8929 Other chronic pain: Secondary | ICD-10-CM | POA: Diagnosis not present

## 2023-03-17 DIAGNOSIS — M1712 Unilateral primary osteoarthritis, left knee: Secondary | ICD-10-CM

## 2023-03-17 DIAGNOSIS — M25562 Pain in left knee: Secondary | ICD-10-CM | POA: Diagnosis not present

## 2023-03-17 MED ORDER — LIDOCAINE HCL 1 % IJ SOLN
3.0000 mL | INTRAMUSCULAR | Status: AC | PRN
  Administered 2023-03-17: 3 mL

## 2023-03-17 MED ORDER — METHYLPREDNISOLONE ACETATE 40 MG/ML IJ SUSP
40.0000 mg | INTRAMUSCULAR | Status: AC | PRN
  Administered 2023-03-17: 40 mg via INTRA_ARTICULAR

## 2023-03-17 NOTE — Progress Notes (Signed)
The patient is an active 73 year old female well-known to [place].  She is thin and not a diabetic.  She has known pain for arthritis of her left knee.  She is requesting a steroid injection today.  She knows to always wait at least between 3 months and more before having an injection.  She has had no acute change in her medical status.  She said the last injection worked up to about a week and a half ago.  She says that usually taken about a week after having an injection.  She still states that she is not ready for any type of surgical intervention for her knee.  We did talk about exercises that she should avoid and things that she can do to still help with her knee.  Examination of her left knee today shows no malalignment.  There is pain throughout the arc of motion of her knee but it is ligamentously stable and no effusion today.  Per her request I did place a steroid injection in her left knee today which she tolerated well.  She can have this again at a minimum 3 months if needed.    Procedure Note  Patient: Theresa Huff             Date of Birth: Mar 29, 1950           MRN: [id number]             Visit Date: 03/17/2023  Procedures: Visit Diagnoses:  1. Unilateral primary osteoarthritis, left knee   2. Chronic pain of left knee     Large Joint Inj: L knee on 03/17/2023 3:01 PM Indications: diagnostic evaluation and pain Details: 22 G 1.5 in needle, superolateral approach  Arthrogram: No  Medications: 3 mL lidocaine 1 %; 40 mg methylPREDNISolone acetate 40 MG/ML Outcome: tolerated well, no immediate complications Procedure, treatment alternatives, risks and benefits explained, specific risks discussed. Consent was given by the patient. Immediately prior to procedure a time out was called to verify the correct patient, procedure, equipment, support staff and site/side marked as required. Patient was prepped and draped in the usual sterile fashion.

## 2023-03-18 DIAGNOSIS — H18831 Recurrent erosion of cornea, right eye: Secondary | ICD-10-CM | POA: Diagnosis not present

## 2023-04-09 DIAGNOSIS — B029 Zoster without complications: Secondary | ICD-10-CM | POA: Diagnosis not present

## 2023-04-14 DIAGNOSIS — M810 Age-related osteoporosis without current pathological fracture: Secondary | ICD-10-CM | POA: Diagnosis not present

## 2023-04-14 DIAGNOSIS — Z853 Personal history of malignant neoplasm of breast: Secondary | ICD-10-CM | POA: Diagnosis not present

## 2023-04-14 DIAGNOSIS — Z1231 Encounter for screening mammogram for malignant neoplasm of breast: Secondary | ICD-10-CM | POA: Diagnosis not present

## 2023-04-14 DIAGNOSIS — M8588 Other specified disorders of bone density and structure, other site: Secondary | ICD-10-CM | POA: Diagnosis not present

## 2023-04-20 ENCOUNTER — Other Ambulatory Visit: Payer: Self-pay

## 2023-04-20 ENCOUNTER — Other Ambulatory Visit: Payer: Self-pay | Admitting: Neurology

## 2023-04-20 DIAGNOSIS — G243 Spasmodic torticollis: Secondary | ICD-10-CM

## 2023-04-20 NOTE — Progress Notes (Signed)
 Specialty Pharmacy Refill Coordination Note  Theresa Huff is a 73 y.o. female contacted today regarding refills of specialty medication(s) OnabotulinumtoxinA (Botox)   Patient requested Courier to Provider Office   Delivery date: 05/16/23   Verified address: [address] [place], [place] [address]   Medication will be filled on 05/13/23.

## 2023-04-21 ENCOUNTER — Other Ambulatory Visit (HOSPITAL_COMMUNITY): Payer: Self-pay

## 2023-04-21 MED ORDER — BOTOX 100 UNITS IJ SOLR
INTRAMUSCULAR | 1 refills | Status: DC
  Filled 2023-04-21: qty 3, 90d supply, fill #0
  Filled 2023-08-23: qty 3, 90d supply, fill #1

## 2023-05-02 DIAGNOSIS — Z1379 Encounter for other screening for genetic and chromosomal anomalies: Secondary | ICD-10-CM | POA: Diagnosis not present

## 2023-05-04 ENCOUNTER — Other Ambulatory Visit: Payer: Self-pay

## 2023-05-12 ENCOUNTER — Other Ambulatory Visit (HOSPITAL_COMMUNITY): Payer: Self-pay

## 2023-05-13 ENCOUNTER — Other Ambulatory Visit: Payer: Self-pay

## 2023-05-16 DIAGNOSIS — K2930 Chronic superficial gastritis without bleeding: Secondary | ICD-10-CM | POA: Diagnosis not present

## 2023-05-16 DIAGNOSIS — K219 Gastro-esophageal reflux disease without esophagitis: Secondary | ICD-10-CM | POA: Diagnosis not present

## 2023-05-16 DIAGNOSIS — K3189 Other diseases of stomach and duodenum: Secondary | ICD-10-CM | POA: Diagnosis not present

## 2023-05-16 DIAGNOSIS — R1310 Dysphagia, unspecified: Secondary | ICD-10-CM | POA: Diagnosis not present

## 2023-05-18 DIAGNOSIS — M810 Age-related osteoporosis without current pathological fracture: Secondary | ICD-10-CM | POA: Diagnosis not present

## 2023-05-18 DIAGNOSIS — C50919 Malignant neoplasm of unspecified site of unspecified female breast: Secondary | ICD-10-CM | POA: Diagnosis not present

## 2023-05-18 DIAGNOSIS — K2930 Chronic superficial gastritis without bleeding: Secondary | ICD-10-CM | POA: Diagnosis not present

## 2023-05-18 DIAGNOSIS — E559 Vitamin D deficiency, unspecified: Secondary | ICD-10-CM | POA: Diagnosis not present

## 2023-05-18 DIAGNOSIS — K219 Gastro-esophageal reflux disease without esophagitis: Secondary | ICD-10-CM | POA: Diagnosis not present

## 2023-05-20 ENCOUNTER — Ambulatory Visit: Payer: Medicare HMO | Admitting: Neurology

## 2023-05-20 DIAGNOSIS — G243 Spasmodic torticollis: Secondary | ICD-10-CM

## 2023-05-20 MED ORDER — ONABOTULINUMTOXINA 100 UNITS IJ SOLR
300.0000 [IU] | Freq: Once | INTRAMUSCULAR | Status: AC
  Administered 2023-05-20: 290 [IU] via INTRAMUSCULAR

## 2023-05-20 NOTE — Procedures (Signed)
 [facility]   Procedure Note Botox   Attending: Dr. Ivette Marks Orla Jolliff  Preoperative Diagnosis(es): Cervical Dystonia  Result History  Happy with the results  Consent obtained from: The patient Benefits discussed included, but were not limited to decreased muscle tightness, increased joint range of motion, and decreased pain.  Risk discussed included, but were not limited pain and discomfort, bleeding, bruising, excessive weakness, venous thrombosis, muscle atrophy and dysphagia.  A copy of the patient medication guide was given to the patient which explains the blackbox warning.  Patients identity and treatment sites confirmed Yes.  .  Details of Procedure: Skin was cleaned with alcohol.  A 30 gauge, 25mm  needle was introduced to the target muscle, except for posterior splenius where 27 gauge, 1.5 inch needle used.   Prior to injection, the needle plunger was aspirated to make sure the needle was not within a blood vessel.  There was no blood retrieved on aspiration.    Following is a summary of the muscles injected  And the amount of Botulinum toxin used:   Dilution 0.9% preservative free saline mixed with 100 u Botox  type A (onobotunlinum toxin type A) to make 10 U per 0.1cc  Injections  Location Left  Right Units Number of sites        Sternocleidomastoid  60/20 80 2  Splenius Capitus, posterior approach 100  100 1  Splenius Capitus, lateral approach 40  40 1  Levator Scapulae      Trapezius 20/20/10 20 70         TOTAL UNITS:   290    Agent: Xeomin .  3 vials of onobotulinum toxin type A were used, each containing 100 units and freshly diluted with 1 mL of sterile, non-preserved saline   Total injected (Units): 290  Total wasted (Units): 10 waste   Pt tolerated procedure well without complications.   Reinjection is anticipated in 3 months.

## 2023-06-14 DIAGNOSIS — R059 Cough, unspecified: Secondary | ICD-10-CM | POA: Diagnosis not present

## 2023-06-14 DIAGNOSIS — J302 Other seasonal allergic rhinitis: Secondary | ICD-10-CM | POA: Diagnosis not present

## 2023-06-14 DIAGNOSIS — K219 Gastro-esophageal reflux disease without esophagitis: Secondary | ICD-10-CM | POA: Diagnosis not present

## 2023-06-23 ENCOUNTER — Encounter: Payer: Self-pay | Admitting: Orthopaedic Surgery

## 2023-06-23 ENCOUNTER — Ambulatory Visit: Payer: Medicare HMO | Admitting: Orthopaedic Surgery

## 2023-06-23 DIAGNOSIS — M1712 Unilateral primary osteoarthritis, left knee: Secondary | ICD-10-CM | POA: Diagnosis not present

## 2023-06-23 DIAGNOSIS — G8929 Other chronic pain: Secondary | ICD-10-CM | POA: Diagnosis not present

## 2023-06-23 DIAGNOSIS — M25562 Pain in left knee: Secondary | ICD-10-CM | POA: Diagnosis not present

## 2023-06-23 DIAGNOSIS — L304 Erythema intertrigo: Secondary | ICD-10-CM | POA: Diagnosis not present

## 2023-06-23 DIAGNOSIS — M25561 Pain in right knee: Secondary | ICD-10-CM

## 2023-06-23 DIAGNOSIS — L309 Dermatitis, unspecified: Secondary | ICD-10-CM | POA: Diagnosis not present

## 2023-06-23 MED ORDER — LIDOCAINE HCL 1 % IJ SOLN
3.0000 mL | INTRAMUSCULAR | Status: AC | PRN
  Administered 2023-06-23: 3 mL

## 2023-06-23 MED ORDER — METHYLPREDNISOLONE ACETATE 40 MG/ML IJ SUSP
40.0000 mg | INTRAMUSCULAR | Status: AC | PRN
  Administered 2023-06-23: 40 mg via INTRA_ARTICULAR

## 2023-06-23 NOTE — Progress Notes (Signed)
 The patient comes in requesting a steroid injection in both knees today.  She is 73 years old and we have seen her for a long period of time.  She comes in about every 3 months for steroid injections in her left knee.  Her right knee is only started hurting recently and she would like to consider steroid injection in that knee as well today.  Steroids do help her for about 3 months each time.  She is not interested in any type of surgery.  She is not a diabetic.  She has never had an injury to either knee and steroids do help her.  Examination both knees show no effusion.  Both knees have just slight varus malalignment but no significant deformities and good range of motion overall with some global tenderness.  I did place a sterile injection in both knees per her request today.  We counseled her about the risk and benefits of steroid injections.  We can repeat this in 3 months if needed.    Procedure Note  Patient: Theresa Huff             Date of Birth: 23-Sep-1950           MRN: [id number]             Visit Date: 06/23/2023  Procedures: Visit Diagnoses:  1. Unilateral primary osteoarthritis, left knee   2. Chronic pain of left knee   3. Acute pain of right knee     Large Joint Inj: R knee on 06/23/2023 1:25 PM Indications: diagnostic evaluation and pain Details: 22 G 1.5 in needle, superolateral approach  Arthrogram: No  Medications: 3 mL lidocaine  1 %; 40 mg methylPREDNISolone  acetate 40 MG/ML Outcome: tolerated well, no immediate complications Procedure, treatment alternatives, risks and benefits explained, specific risks discussed. Consent was given by the patient. Immediately prior to procedure a time out was called to verify the correct patient, procedure, equipment, support staff and site/side marked as required. Patient was prepped and draped in the usual sterile fashion.    Large Joint Inj: L knee on 06/23/2023 1:25 PM Indications: diagnostic evaluation and pain Details:  22 G 1.5 in needle, superolateral approach  Arthrogram: No  Medications: 3 mL lidocaine  1 %; 40 mg methylPREDNISolone  acetate 40 MG/ML Outcome: tolerated well, no immediate complications Procedure, treatment alternatives, risks and benefits explained, specific risks discussed. Consent was given by the patient. Immediately prior to procedure a time out was called to verify the correct patient, procedure, equipment, support staff and site/side marked as required. Patient was prepped and draped in the usual sterile fashion.

## 2023-06-25 DIAGNOSIS — M810 Age-related osteoporosis without current pathological fracture: Secondary | ICD-10-CM | POA: Diagnosis not present

## 2023-06-25 DIAGNOSIS — Z853 Personal history of malignant neoplasm of breast: Secondary | ICD-10-CM | POA: Diagnosis not present

## 2023-06-25 DIAGNOSIS — E7800 Pure hypercholesterolemia, unspecified: Secondary | ICD-10-CM | POA: Diagnosis not present

## 2023-06-25 DIAGNOSIS — F339 Major depressive disorder, recurrent, unspecified: Secondary | ICD-10-CM | POA: Diagnosis not present

## 2023-07-31 DIAGNOSIS — H6090 Unspecified otitis externa, unspecified ear: Secondary | ICD-10-CM | POA: Diagnosis not present

## 2023-07-31 DIAGNOSIS — L0889 Other specified local infections of the skin and subcutaneous tissue: Secondary | ICD-10-CM | POA: Diagnosis not present

## 2023-08-01 DIAGNOSIS — G35 Multiple sclerosis: Secondary | ICD-10-CM | POA: Diagnosis not present

## 2023-08-01 DIAGNOSIS — M545 Low back pain, unspecified: Secondary | ICD-10-CM | POA: Diagnosis not present

## 2023-08-05 ENCOUNTER — Other Ambulatory Visit: Payer: Self-pay

## 2023-08-08 DIAGNOSIS — M545 Low back pain, unspecified: Secondary | ICD-10-CM | POA: Diagnosis not present

## 2023-08-15 DIAGNOSIS — M545 Low back pain, unspecified: Secondary | ICD-10-CM | POA: Diagnosis not present

## 2023-08-15 DIAGNOSIS — Z853 Personal history of malignant neoplasm of breast: Secondary | ICD-10-CM | POA: Diagnosis not present

## 2023-08-15 DIAGNOSIS — F339 Major depressive disorder, recurrent, unspecified: Secondary | ICD-10-CM | POA: Diagnosis not present

## 2023-08-15 DIAGNOSIS — M25572 Pain in left ankle and joints of left foot: Secondary | ICD-10-CM | POA: Diagnosis not present

## 2023-08-15 DIAGNOSIS — K219 Gastro-esophageal reflux disease without esophagitis: Secondary | ICD-10-CM | POA: Diagnosis not present

## 2023-08-15 DIAGNOSIS — E7800 Pure hypercholesterolemia, unspecified: Secondary | ICD-10-CM | POA: Diagnosis not present

## 2023-08-15 DIAGNOSIS — J309 Allergic rhinitis, unspecified: Secondary | ICD-10-CM | POA: Diagnosis not present

## 2023-08-15 DIAGNOSIS — F439 Reaction to severe stress, unspecified: Secondary | ICD-10-CM | POA: Diagnosis not present

## 2023-08-15 DIAGNOSIS — G4700 Insomnia, unspecified: Secondary | ICD-10-CM | POA: Diagnosis not present

## 2023-08-15 DIAGNOSIS — G250 Essential tremor: Secondary | ICD-10-CM | POA: Diagnosis not present

## 2023-08-15 DIAGNOSIS — M810 Age-related osteoporosis without current pathological fracture: Secondary | ICD-10-CM | POA: Diagnosis not present

## 2023-08-15 DIAGNOSIS — E559 Vitamin D deficiency, unspecified: Secondary | ICD-10-CM | POA: Diagnosis not present

## 2023-08-19 ENCOUNTER — Ambulatory Visit: Admitting: Neurology

## 2023-08-20 DIAGNOSIS — G35 Multiple sclerosis: Secondary | ICD-10-CM | POA: Diagnosis not present

## 2023-08-23 ENCOUNTER — Other Ambulatory Visit: Payer: Self-pay

## 2023-08-25 ENCOUNTER — Other Ambulatory Visit: Payer: Self-pay

## 2023-08-25 DIAGNOSIS — E7800 Pure hypercholesterolemia, unspecified: Secondary | ICD-10-CM | POA: Diagnosis not present

## 2023-08-25 DIAGNOSIS — Z853 Personal history of malignant neoplasm of breast: Secondary | ICD-10-CM | POA: Diagnosis not present

## 2023-08-25 DIAGNOSIS — F339 Major depressive disorder, recurrent, unspecified: Secondary | ICD-10-CM | POA: Diagnosis not present

## 2023-08-25 DIAGNOSIS — M810 Age-related osteoporosis without current pathological fracture: Secondary | ICD-10-CM | POA: Diagnosis not present

## 2023-08-26 ENCOUNTER — Other Ambulatory Visit: Payer: Self-pay

## 2023-08-26 NOTE — Progress Notes (Signed)
 Specialty Pharmacy Refill Coordination Note  Theresa Huff is a 73 y.o. female contacted today regarding refills of specialty medication(s) OnabotulinumtoxinA  (Botox )   Patient requested Courier to Provider Office   Delivery date: 08/29/23   Verified address: [address] [place], [place] [address]   Medication will be filled on 08/26/23.

## 2023-08-29 ENCOUNTER — Other Ambulatory Visit (HOSPITAL_COMMUNITY): Payer: Self-pay

## 2023-09-02 ENCOUNTER — Ambulatory Visit: Admitting: Neurology

## 2023-09-02 DIAGNOSIS — G243 Spasmodic torticollis: Secondary | ICD-10-CM

## 2023-09-02 MED ORDER — ONABOTULINUMTOXINA 100 UNITS IJ SOLR
300.0000 [IU] | Freq: Once | INTRAMUSCULAR | Status: AC
  Administered 2023-09-02: 290 [IU] via INTRAMUSCULAR

## 2023-09-02 NOTE — Procedures (Signed)
 [facility]   Procedure Note Botox   Attending: Dr. Asberry Johni Narine  Preoperative Diagnosis(es): Cervical Dystonia  Result History  Happy with the results  Prior history with toxins: Failed Xeomin   Consent obtained from: The patient Benefits discussed included, but were not limited to decreased muscle tightness, increased joint range of motion, and decreased pain.  Risk discussed included, but were not limited pain and discomfort, bleeding, bruising, excessive weakness, venous thrombosis, muscle atrophy and dysphagia.  A copy of the patient medication guide was given to the patient which explains the blackbox warning.  Patients identity and treatment sites confirmed Yes.  .  Details of Procedure: Skin was cleaned with alcohol.  A 30 gauge, 25mm  needle was introduced to the target muscle, except for posterior splenius where 27 gauge, 1.5 inch needle used.   Prior to injection, the needle plunger was aspirated to make sure the needle was not within a blood vessel.  There was no blood retrieved on aspiration.    Following is a summary of the muscles injected  And the amount of Botulinum toxin used:   Dilution 0.9% preservative free saline mixed with 100 u Botox  type A (onobotunlinum toxin type A) to make 10 U per 0.1cc  Injections  Location Left  Right Units Number of sites        Sternocleidomastoid  60/20 80 2  Splenius Capitus, posterior approach 100  100 1  Splenius Capitus, lateral approach 40  40 1  Levator Scapulae      Trapezius 20/20/10 20 70         TOTAL UNITS:   290    Agent: Xeomin .  3 vials of onobotulinum toxin type A were used, each containing 100 units and freshly diluted with 1 mL of sterile, non-preserved saline   Total injected (Units): 290  Total wasted (Units): 10 waste   Pt tolerated procedure well without complications.   Reinjection is anticipated in 3 months.

## 2023-09-05 DIAGNOSIS — M4316 Spondylolisthesis, lumbar region: Secondary | ICD-10-CM | POA: Diagnosis not present

## 2023-09-22 ENCOUNTER — Encounter: Payer: Self-pay | Admitting: Orthopaedic Surgery

## 2023-09-22 ENCOUNTER — Ambulatory Visit: Admitting: Orthopaedic Surgery

## 2023-09-22 DIAGNOSIS — M25561 Pain in right knee: Secondary | ICD-10-CM

## 2023-09-22 DIAGNOSIS — G8929 Other chronic pain: Secondary | ICD-10-CM | POA: Diagnosis not present

## 2023-09-22 DIAGNOSIS — M25562 Pain in left knee: Secondary | ICD-10-CM | POA: Diagnosis not present

## 2023-09-22 MED ORDER — METHYLPREDNISOLONE ACETATE 40 MG/ML IJ SUSP
40.0000 mg | INTRAMUSCULAR | Status: AC | PRN
  Administered 2023-09-22: 40 mg via INTRA_ARTICULAR

## 2023-09-22 MED ORDER — LIDOCAINE HCL 1 % IJ SOLN
3.0000 mL | INTRAMUSCULAR | Status: AC | PRN
  Administered 2023-09-22: 3 mL

## 2023-09-22 NOTE — Progress Notes (Signed)
 The patient is a 73 year old female with chronic bilateral knee pain.  She comes in about every 3 months for steroid injections in her knees to treat the pain.  She does have osteoarthritis of both knees and she says she is still not ready for knee replacement surgery.  She says the injections help long enough for her.  She is not a diabetic.  She has had no acute change in her medical status.  Both knees were examined today and showed no significant malalignment and no effusion but global pain from her arthritis.  She has good range of motion of both knees and they are ligamentously stable.  Per the patient's request we did place a steroid injection in both knees today.  She understands the risks and benefits of these injections.  She also knows to wait at least 3 months between steroid injections.    Procedure Note  Patient: Theresa Huff             Date of Birth: 05/25/1950           MRN: [id number]             Visit Date: 09/22/2023  Procedures: Visit Diagnoses:  1. Chronic pain of left knee   2. Chronic pain of right knee     Large Joint Inj: R knee on 09/22/2023 1:12 PM Indications: diagnostic evaluation and pain Details: 22 G 1.5 in needle, superolateral approach  Arthrogram: No  Medications: 3 mL lidocaine  1 %; 40 mg methylPREDNISolone  acetate 40 MG/ML Outcome: tolerated well, no immediate complications Procedure, treatment alternatives, risks and benefits explained, specific risks discussed. Consent was given by the patient. Immediately prior to procedure a time out was called to verify the correct patient, procedure, equipment, support staff and site/side marked as required. Patient was prepped and draped in the usual sterile fashion.    Large Joint Inj: L knee on 09/22/2023 1:12 PM Indications: diagnostic evaluation and pain Details: 22 G 1.5 in needle, superolateral approach  Arthrogram: No  Medications: 3 mL lidocaine  1 %; 40 mg methylPREDNISolone  acetate 40  MG/ML Outcome: tolerated well, no immediate complications Procedure, treatment alternatives, risks and benefits explained, specific risks discussed. Consent was given by the patient. Immediately prior to procedure a time out was called to verify the correct patient, procedure, equipment, support staff and site/side marked as required. Patient was prepped and draped in the usual sterile fashion.

## 2023-09-25 DIAGNOSIS — Z853 Personal history of malignant neoplasm of breast: Secondary | ICD-10-CM | POA: Diagnosis not present

## 2023-09-25 DIAGNOSIS — M810 Age-related osteoporosis without current pathological fracture: Secondary | ICD-10-CM | POA: Diagnosis not present

## 2023-09-25 DIAGNOSIS — E7800 Pure hypercholesterolemia, unspecified: Secondary | ICD-10-CM | POA: Diagnosis not present

## 2023-09-25 DIAGNOSIS — F339 Major depressive disorder, recurrent, unspecified: Secondary | ICD-10-CM | POA: Diagnosis not present

## 2023-09-27 DIAGNOSIS — C009 Malignant neoplasm of lip, unspecified: Secondary | ICD-10-CM | POA: Diagnosis not present

## 2023-09-29 DIAGNOSIS — M545 Low back pain, unspecified: Secondary | ICD-10-CM | POA: Diagnosis not present

## 2023-10-04 DIAGNOSIS — C50919 Malignant neoplasm of unspecified site of unspecified female breast: Secondary | ICD-10-CM | POA: Diagnosis not present

## 2023-10-04 DIAGNOSIS — M810 Age-related osteoporosis without current pathological fracture: Secondary | ICD-10-CM | POA: Diagnosis not present

## 2023-10-04 DIAGNOSIS — K219 Gastro-esophageal reflux disease without esophagitis: Secondary | ICD-10-CM | POA: Diagnosis not present

## 2023-10-04 DIAGNOSIS — E559 Vitamin D deficiency, unspecified: Secondary | ICD-10-CM | POA: Diagnosis not present

## 2023-10-06 DIAGNOSIS — M545 Low back pain, unspecified: Secondary | ICD-10-CM | POA: Diagnosis not present

## 2023-10-13 DIAGNOSIS — M810 Age-related osteoporosis without current pathological fracture: Secondary | ICD-10-CM | POA: Diagnosis not present

## 2023-10-24 DIAGNOSIS — H35372 Puckering of macula, left eye: Secondary | ICD-10-CM | POA: Diagnosis not present

## 2023-10-27 DIAGNOSIS — M545 Low back pain, unspecified: Secondary | ICD-10-CM | POA: Diagnosis not present

## 2023-10-28 DIAGNOSIS — L821 Other seborrheic keratosis: Secondary | ICD-10-CM | POA: Diagnosis not present

## 2023-10-28 DIAGNOSIS — L608 Other nail disorders: Secondary | ICD-10-CM | POA: Diagnosis not present

## 2023-10-28 DIAGNOSIS — Z85820 Personal history of malignant melanoma of skin: Secondary | ICD-10-CM | POA: Diagnosis not present

## 2023-10-28 DIAGNOSIS — L570 Actinic keratosis: Secondary | ICD-10-CM | POA: Diagnosis not present

## 2023-10-28 DIAGNOSIS — L814 Other melanin hyperpigmentation: Secondary | ICD-10-CM | POA: Diagnosis not present

## 2023-10-28 DIAGNOSIS — L578 Other skin changes due to chronic exposure to nonionizing radiation: Secondary | ICD-10-CM | POA: Diagnosis not present

## 2023-10-28 DIAGNOSIS — D239 Other benign neoplasm of skin, unspecified: Secondary | ICD-10-CM | POA: Diagnosis not present

## 2023-10-28 DIAGNOSIS — L719 Rosacea, unspecified: Secondary | ICD-10-CM | POA: Diagnosis not present

## 2023-10-28 DIAGNOSIS — D1801 Hemangioma of skin and subcutaneous tissue: Secondary | ICD-10-CM | POA: Diagnosis not present

## 2023-11-01 DIAGNOSIS — H2513 Age-related nuclear cataract, bilateral: Secondary | ICD-10-CM | POA: Diagnosis not present

## 2023-11-01 DIAGNOSIS — H35361 Drusen (degenerative) of macula, right eye: Secondary | ICD-10-CM | POA: Diagnosis not present

## 2023-11-01 DIAGNOSIS — H35372 Puckering of macula, left eye: Secondary | ICD-10-CM | POA: Diagnosis not present

## 2023-11-01 DIAGNOSIS — H43823 Vitreomacular adhesion, bilateral: Secondary | ICD-10-CM | POA: Diagnosis not present

## 2023-11-04 DIAGNOSIS — M545 Low back pain, unspecified: Secondary | ICD-10-CM | POA: Diagnosis not present

## 2023-11-07 DIAGNOSIS — M542 Cervicalgia: Secondary | ICD-10-CM | POA: Diagnosis not present

## 2023-11-07 DIAGNOSIS — M545 Low back pain, unspecified: Secondary | ICD-10-CM | POA: Diagnosis not present

## 2023-11-08 DIAGNOSIS — M4316 Spondylolisthesis, lumbar region: Secondary | ICD-10-CM | POA: Diagnosis not present

## 2023-11-08 DIAGNOSIS — Z6826 Body mass index (BMI) 26.0-26.9, adult: Secondary | ICD-10-CM | POA: Diagnosis not present

## 2023-11-16 DIAGNOSIS — M4186 Other forms of scoliosis, lumbar region: Secondary | ICD-10-CM | POA: Diagnosis not present

## 2023-11-16 DIAGNOSIS — M545 Low back pain, unspecified: Secondary | ICD-10-CM | POA: Diagnosis not present

## 2023-11-16 DIAGNOSIS — M4316 Spondylolisthesis, lumbar region: Secondary | ICD-10-CM | POA: Diagnosis not present

## 2023-11-16 DIAGNOSIS — M48061 Spinal stenosis, lumbar region without neurogenic claudication: Secondary | ICD-10-CM | POA: Diagnosis not present

## 2023-11-17 ENCOUNTER — Other Ambulatory Visit: Payer: Self-pay

## 2023-11-17 ENCOUNTER — Other Ambulatory Visit: Payer: Self-pay | Admitting: Neurology

## 2023-11-17 ENCOUNTER — Other Ambulatory Visit (HOSPITAL_COMMUNITY): Payer: Self-pay

## 2023-11-17 DIAGNOSIS — G243 Spasmodic torticollis: Secondary | ICD-10-CM

## 2023-11-17 MED ORDER — BOTOX 100 UNITS IJ SOLR
INTRAMUSCULAR | 1 refills | Status: AC
  Filled 2023-11-17: qty 3, 90d supply, fill #0
  Filled 2024-02-15: qty 3, 90d supply, fill #1

## 2023-11-17 NOTE — Progress Notes (Signed)
 Specialty Pharmacy Refill Coordination Note  Theresa Huff is a 73 y.o. female assessed today regarding refills of clinic administered specialty medication(s) OnabotulinumtoxinA  (Botox )   Clinic requested Courier to Provider Office   Delivery date: 11/29/23   Verified address: [address] [place], [place] [address]   Medication will be filled on 11/28/23.

## 2023-11-18 ENCOUNTER — Other Ambulatory Visit (HOSPITAL_COMMUNITY): Payer: Self-pay

## 2023-11-25 DIAGNOSIS — E7800 Pure hypercholesterolemia, unspecified: Secondary | ICD-10-CM | POA: Diagnosis not present

## 2023-11-25 DIAGNOSIS — F339 Major depressive disorder, recurrent, unspecified: Secondary | ICD-10-CM | POA: Diagnosis not present

## 2023-11-25 DIAGNOSIS — M810 Age-related osteoporosis without current pathological fracture: Secondary | ICD-10-CM | POA: Diagnosis not present

## 2023-11-25 DIAGNOSIS — Z853 Personal history of malignant neoplasm of breast: Secondary | ICD-10-CM | POA: Diagnosis not present

## 2023-11-28 ENCOUNTER — Encounter: Payer: Self-pay | Admitting: Radiology

## 2023-11-28 ENCOUNTER — Other Ambulatory Visit: Payer: Self-pay

## 2023-11-28 DIAGNOSIS — H35361 Drusen (degenerative) of macula, right eye: Secondary | ICD-10-CM | POA: Diagnosis not present

## 2023-11-28 DIAGNOSIS — H2513 Age-related nuclear cataract, bilateral: Secondary | ICD-10-CM | POA: Diagnosis not present

## 2023-11-28 DIAGNOSIS — H43823 Vitreomacular adhesion, bilateral: Secondary | ICD-10-CM | POA: Diagnosis not present

## 2023-11-28 DIAGNOSIS — H35372 Puckering of macula, left eye: Secondary | ICD-10-CM | POA: Diagnosis not present

## 2023-12-02 ENCOUNTER — Ambulatory Visit: Admitting: Neurology

## 2023-12-02 DIAGNOSIS — G243 Spasmodic torticollis: Secondary | ICD-10-CM

## 2023-12-02 MED ORDER — ONABOTULINUMTOXINA 100 UNITS IJ SOLR
300.0000 [IU] | Freq: Once | INTRAMUSCULAR | Status: AC
  Administered 2023-12-02: 290 [IU] via INTRAMUSCULAR

## 2023-12-02 NOTE — Procedures (Signed)
 [facility]   Procedure Note Botox   Attending: Dr. Asberry Tanica Gaige  Preoperative Diagnosis(es): Cervical Dystonia  Result History  Happy with the results/stable  Current toxin:  onobotulinumtoxinA  Prior toxins: Failed Xeomin   Consent obtained from: The patient Benefits discussed included, but were not limited to decreased muscle tightness, increased joint range of motion, and decreased pain.  Risk discussed included, but were not limited pain and discomfort, bleeding, bruising, excessive weakness, venous thrombosis, muscle atrophy and dysphagia.  A copy of the patient medication guide was given to the patient which explains the blackbox warning.  Patients identity and treatment sites confirmed Yes.  .  Details of Procedure: Skin was cleaned with alcohol.  A 30 gauge, 25mm  needle was introduced to the target muscle, except for posterior splenius where 27 gauge, 1.5 inch needle used.   Prior to injection, the needle plunger was aspirated to make sure the needle was not within a blood vessel.  There was no blood retrieved on aspiration.    Following is a summary of the muscles injected  And the amount of Botulinum toxin used:   Dilution 0.9% preservative free saline mixed with 100 u Botox  type A (onobotunlinum toxin type A) to make 10 U per 0.1cc  Injections  Location Left  Right Units Number of sites        Sternocleidomastoid  60/20 80 2  Splenius Capitus, posterior approach 100  100 1  Splenius Capitus, lateral approach 40  40 1  Levator Scapulae      Trapezius 20/20/10 20 70         TOTAL UNITS:   290    Agent: Xeomin .  3 vials of onobotulinum toxin type A were used, each containing 100 units and freshly diluted with 1 mL of sterile, non-preserved saline   Total injected (Units): 290  Total wasted (Units): 10 waste   Pt tolerated procedure well without complications.   Reinjection is anticipated in 3 months.

## 2023-12-08 DIAGNOSIS — M5416 Radiculopathy, lumbar region: Secondary | ICD-10-CM | POA: Diagnosis not present

## 2023-12-08 DIAGNOSIS — M4316 Spondylolisthesis, lumbar region: Secondary | ICD-10-CM | POA: Diagnosis not present

## 2023-12-14 DIAGNOSIS — H33332 Multiple defects of retina without detachment, left eye: Secondary | ICD-10-CM | POA: Diagnosis not present

## 2023-12-14 DIAGNOSIS — H35372 Puckering of macula, left eye: Secondary | ICD-10-CM | POA: Diagnosis not present

## 2023-12-14 DIAGNOSIS — H33302 Unspecified retinal break, left eye: Secondary | ICD-10-CM | POA: Diagnosis not present

## 2023-12-25 DIAGNOSIS — Z853 Personal history of malignant neoplasm of breast: Secondary | ICD-10-CM | POA: Diagnosis not present

## 2023-12-25 DIAGNOSIS — E7800 Pure hypercholesterolemia, unspecified: Secondary | ICD-10-CM | POA: Diagnosis not present

## 2023-12-25 DIAGNOSIS — M810 Age-related osteoporosis without current pathological fracture: Secondary | ICD-10-CM | POA: Diagnosis not present

## 2023-12-25 DIAGNOSIS — F339 Major depressive disorder, recurrent, unspecified: Secondary | ICD-10-CM | POA: Diagnosis not present

## 2023-12-26 ENCOUNTER — Ambulatory Visit: Admitting: Orthopaedic Surgery

## 2023-12-26 DIAGNOSIS — H33332 Multiple defects of retina without detachment, left eye: Secondary | ICD-10-CM | POA: Diagnosis not present

## 2023-12-26 DIAGNOSIS — Z9889 Other specified postprocedural states: Secondary | ICD-10-CM | POA: Diagnosis not present

## 2023-12-26 DIAGNOSIS — H35372 Puckering of macula, left eye: Secondary | ICD-10-CM | POA: Diagnosis not present

## 2024-01-09 ENCOUNTER — Ambulatory Visit: Admitting: Orthopaedic Surgery

## 2024-01-09 DIAGNOSIS — G8929 Other chronic pain: Secondary | ICD-10-CM

## 2024-01-09 MED ORDER — METHYLPREDNISOLONE ACETATE 40 MG/ML IJ SUSP
40.0000 mg | INTRAMUSCULAR | Status: AC | PRN
  Administered 2024-01-09: 16:00:00 40 mg via INTRA_ARTICULAR

## 2024-01-09 MED ORDER — METHYLPREDNISOLONE ACETATE 40 MG/ML IJ SUSP
40.0000 mg | INTRAMUSCULAR | Status: AC | PRN
  Administered 2024-01-09: 15:00:00 40 mg via INTRA_ARTICULAR

## 2024-01-09 MED ORDER — LIDOCAINE HCL 1 % IJ SOLN
3.0000 mL | INTRAMUSCULAR | Status: AC | PRN
  Administered 2024-01-09: 16:00:00 3 mL

## 2024-01-09 MED ORDER — LIDOCAINE HCL 1 % IJ SOLN
3.0000 mL | INTRAMUSCULAR | Status: AC | PRN
  Administered 2024-01-09: 15:00:00 3 mL

## 2024-01-09 NOTE — Progress Notes (Signed)
 The patient is a 73 year old active female well-known to us .  She comes in about every 3 months for steroid injections in both knees.  The left knee is much worse off than the right knee in terms of arthritis and pain.  It does wake her up at night.  She is not interested thus far knee replacement surgery.  Even on x-ray and MRI of her left knee there is more cartilage wear.  She says the injections last to less than 3 months.  She is not a diabetic.  Again, she is mobilizing enough to still not want to proceed with any type of knee replacement surgery.  She recently had surgery on one of her eyes for some macular issues and is on prednisone drops.  Examination of both knees today shows no effusion but some global tenderness with good range of motion of both knees.  Both knees are looking stable.  Per her request I did place a steroid injection in both knees which she tolerated well.  She knows that we can repeat these in 3 months.  We talked about the possibility knee replacement surgery but she is still not ready yet.    Procedure Note  Patient: Theresa Huff             Date of Birth: 05/11/1950           MRN: [id number]             Visit Date: 01/09/2024  Procedures: Visit Diagnoses:  1. Chronic pain of left knee   2. Chronic pain of right knee     Large Joint Inj: R knee on 01/09/2024 3:29 PM Indications: diagnostic evaluation and pain Details: 22 G 1.5 in needle, superolateral approach  Arthrogram: No  Medications: 3 mL lidocaine  1 %; 40 mg methylPREDNISolone  acetate 40 MG/ML Outcome: tolerated well, no immediate complications Procedure, treatment alternatives, risks and benefits explained, specific risks discussed. Consent was given by the patient. Immediately prior to procedure a time out was called to verify the correct patient, procedure, equipment, support staff and site/side marked as required. Patient was prepped and draped in the usual sterile fashion.    Large Joint  Inj: L knee on 01/09/2024 3:30 PM Indications: diagnostic evaluation and pain Details: 22 G 1.5 in needle, superolateral approach  Arthrogram: No  Medications: 3 mL lidocaine  1 %; 40 mg methylPREDNISolone  acetate 40 MG/ML Outcome: tolerated well, no immediate complications Procedure, treatment alternatives, risks and benefits explained, specific risks discussed. Consent was given by the patient. Immediately prior to procedure a time out was called to verify the correct patient, procedure, equipment, support staff and site/side marked as required. Patient was prepped and draped in the usual sterile fashion.

## 2024-01-10 ENCOUNTER — Encounter: Payer: Self-pay | Admitting: Gastroenterology

## 2024-01-10 ENCOUNTER — Ambulatory Visit: Admitting: Gastroenterology

## 2024-01-10 VITALS — BP 110/68 | HR 87 | Ht 65.0 in | Wt 156.5 lb

## 2024-01-10 DIAGNOSIS — Z1501 Genetic susceptibility to malignant neoplasm of breast: Secondary | ICD-10-CM

## 2024-01-10 DIAGNOSIS — Z1211 Encounter for screening for malignant neoplasm of colon: Secondary | ICD-10-CM | POA: Insufficient documentation

## 2024-01-10 DIAGNOSIS — K219 Gastro-esophageal reflux disease without esophagitis: Secondary | ICD-10-CM | POA: Insufficient documentation

## 2024-01-10 DIAGNOSIS — Z1509 Genetic susceptibility to other malignant neoplasm: Secondary | ICD-10-CM | POA: Diagnosis not present

## 2024-01-10 DIAGNOSIS — R1319 Other dysphagia: Secondary | ICD-10-CM | POA: Diagnosis not present

## 2024-01-10 DIAGNOSIS — Z9189 Other specified personal risk factors, not elsewhere classified: Secondary | ICD-10-CM

## 2024-01-10 DIAGNOSIS — Z129 Encounter for screening for malignant neoplasm, site unspecified: Secondary | ICD-10-CM | POA: Insufficient documentation

## 2024-01-10 NOTE — Patient Instructions (Signed)
 Your provider has ordered Cologuard testing as an option for colon cancer screening. This is performed by [employer] and may be out of network with your insurance. PRIOR to completing the test, it is YOUR responsibility to contact your insurance about covered benefits for this test. Your out of pocket expense could be anywhere from $0.00 to $649.00.   When you call to check coverage with your insurer, please provide the following information:   -The ONLY provider of Cologuard is [facility], Dispensing  - CPT code for Cologuard is [phone].  [employer] NPI # [id number]  -[employer] Tax ID # [id number]   We have already sent your demographic and insurance information to [employer] (phone number [phone]) and they should contact you within the next week regarding your test. If you have not heard from them within the next week, please call our office at [phone].   You will be contacted by [facility] Sched[facility] in the next 2 days to arrange a MRI/MRCP.  The number on your caller ID will be [phone], please answer when they call.  If you have not heard from them in 2 days please call [phone] to schedule.    We will try to obtain labs from your Primary Care Physician.   _______________________________________________________  If your blood pressure at your visit was 140/90 or greater, please contact your primary care physician to follow up on this.  _______________________________________________________  If you are age 11 or older, your body mass index should be between 23-30. Your Body mass index is 26.04 kg/m. If this is out of the aforementioned range listed, please consider follow up with your Primary Care Provider.  If you are age 75 or younger, your body mass index should be between 19-25. Your Body mass index is 26.04 kg/m. If this is out of the aformentioned range listed, please consider follow up with your Primary  Care Provider.   ________________________________________________________  The [facility] providers would like to encourage you to use MYCHART to communicate with providers for non-urgent requests or questions.  Due to long hold times on the telephone, sending your provider a message by [facility] may be a faster and more efficient way to get a response.  Please allow 48 business hours for a response.  Please remember that this is for non-urgent requests.  _______________________________________________________  [facility] is using a team-based approach to care.  Your team is made up of your doctor and two to three APPS. Our APPS (Nurse Practitioners and Physician Assistants) work with your physician to ensure care continuity for you. They are fully qualified to address your health concerns and develop a treatment plan. They communicate directly with your gastroenterologist to care for you. Seeing the Advanced Practice Practitioners on your physician's team can help you by facilitating care more promptly, often allowing for earlier appointments, access to diagnostic testing, procedures, and other specialty referrals.   Thank you for choosing me and [facility].  Dr. Wilhelmenia

## 2024-01-10 NOTE — Progress Notes (Signed)
 GASTROENTEROLOGY OUTPATIENT CLINIC VISIT   Primary Care Provider Seabron Lenis, MD [address] [place] [address] [phone]  Referring Provider Seabron Lenis, MD [address] [place],  [place] [address] [phone]  Patient Profile: Theresa Huff is a 73 y.o. female with a pmh significant for melanoma, breast cancer, migraines, allergies, MDD, GERD, CDNK2A mutation.  The patient presents to the [facility] for an evaluation and management of problem(s) noted below:  Problem List 1. At high risk for pancreatic cancer   2. Monoallelic mutation of CDKN2A gene   3. Cancer screening   4. Colon cancer screening   5. Gastroesophageal reflux disease, unspecified whether esophagitis present   6. Esophageal dysphagia     History of Present Illness This is the patient's first visit to the outpatient [facility].  She is referred for evaluation of CDKN2A gene mutation and desire for high risk pancreas cancer screening.  She has no family history of pancreaticobiliary disorders.  She has no history of chronic alcohol use.  She has history of intermittent issues of GERD.  She has had dysphagia symptoms rarely, has seen [facility] gastro[facility] with recent upper endoscopy having been performed within the last year (and she states that she was released from their care).  She states that her dysphagia symptoms are somewhat better.  She does not take her PPI every single day but only if she knows that she is going to be eating heavy or have foodstuffs that may cause her to have symptoms.  She has done stool-based colon testing within the last year and this was negative (she believes that this was Cologuard but the Care Everywhere would suggest that this was a FIT based testing).  She denies any changes in her bowel habits.  She has not noticed any blood in her stools.  She is interested in being proactive and would like to proceed with pancreas  cancer screening, if it can be offered here.  GI Review of Systems Positive as above Negative for pain, nausea, vomiting  Review of Systems General: Denies fevers/chills/weight loss unintentionally Cardiovascular: Denies chest pain Pulmonary: Denies shortness of breath Gastroenterological: See HPI Genitourinary: Denies darkened urine Hematological: Denies easy bruising/bleeding Dermatological: Denies jaundice Psychological: Mood is stable  Medications Current Outpatient Medications  Medication Sig Dispense Refill   botulinum toxin Type A  (BOTOX ) 100 units SOLR injection Inject 300 units into the neck and head every 90 days per the doctor 3 each 1   cetirizine (ZYRTEC) 10 MG tablet Take 10 mg by mouth daily.       LORazepam  (ATIVAN ) 2 MG tablet TAKE 1 TABLET BY MOUTH AT BEDTIME AS NEEDED FOR INSOMNIA  0   metroNIDAZOLE (METROGEL) 0.75 % gel Apply 1 Application topically 2 (two) times daily.     omeprazole  (PRILOSEC) 20 MG capsule Take 20 mg by mouth as needed.     prednisoLONE acetate (PRED FORTE) 1 % ophthalmic suspension 1 drop 4 (four) times daily.     SUMAtriptan  (IMITREX ) 50 MG tablet TAKE 1 TABLET BY MOUTH EVERY 2 HOURS AS NEEDED FOR MIGRAINE MAY REPEAT IN 2 HRS IF HEADACHE PERSISTS 9 tablet 1   tiZANidine  (ZANAFLEX ) 4 MG tablet 1 tablet at bedtime as needed Orally 3 times a day; Duration: 30 days As needed     Vitamin D, Ergocalciferol, (DRISDOL) 50000 UNITS CAPS capsule Take 50,000 Units by mouth every 7 (seven) days.     Current Facility-Administered Medications  Medication Dose Route Frequency Provider Last Rate Last Admin   incobotulinumtoxinA  (XEOMIN ) 100 units injection 300 Units  300 Units Intramuscular Q90 days Tat, Asberry RAMAN, DO   270 Units at 08/14/21 1005   incobotulinumtoxinA  (XEOMIN ) 100 units injection 300 Units  300 Units Intramuscular Q90 days TatAsberry RAMAN, DO   290 Units at 12/25/21 9146    Allergies Allergies[1]  Histories Past Medical History:   Diagnosis Date   Allergic rhinitis due to pollen    Allergy    Anemia    with pregnancy   Atypical mole 05/08/1998   Right Lower Back   Atypical mole 06/28/2006   Right Thigh (slight)   Atypical mole 06/28/2006   Right Inner Shin (slight to moderate)   Atypical mole 06/28/2006   Right Shin Sup (slight to moderate)   Atypical mole 06/28/2006   Right Shin Inf (slight to moderate) terril)   Atypical mole 09/07/2006   Right Inner Left Lower (slight to moderate)   Atypical mole 09/07/2006   Left Outer Eye (mild)   Breast cancer (HCC)    Clark level II melanoma (HCC) 03/10/1998   Right Upper Arm (excision)   Cough    Depressive disorder, not elsewhere classified    Family history of melanoma    GERD (gastroesophageal reflux disease)    Hot flashes    Insomnia, unspecified    Melanoma in situ (HCC) 12/15/2005   Right Outer Shin (excision)   Melanoma in situ (HCC) 10/07/2010   Left Elbow   Melanoma in situ (HCC) 09/22/2011   Left Upper Arm (excision)   Melanoma in situ (HCC) 10/24/2017   Left Lower Shin [facility])   Melanoma of skin (HCC) 12/27/2017   Personal history of melanoma in-situ    Plantar fasciitis    Radiation 09/09/14-10/17/14   Left chestwall/supraclav.   Restless leg syndrome 09/09/2010   SCCA (squamous cell carcinoma) of skin 05/27/2014   Left Lower Arm, inf (in situ) (curet and 5FU)   Skin cancer    Wears glasses    Weight gain    Past Surgical History:  Procedure Laterality Date   COLONOSCOPY     EYE SURGERY  2025   MASTECTOMY W/ SENTINEL NODE BIOPSY Left 03/07/2014   Procedure: LEFT TOTAL MASTECTOMY WITH LEFT SENTINEL LYMPH NODE BIOPSY;  Surgeon: Donnice Bury, MD;  Location: [facility];  Service: General;  Laterality: Left;   MELANOMA EXCISION     right leg, left arm, right arm (several from each area)   PORT-A-CATH REMOVAL N/A 12/04/2014   Procedure: REMOVAL PORT-A-CATH;  Surgeon: Donnice Bury, MD;  Location: [facility];  Service: General;  Laterality: N/A;   PORTACATH PLACEMENT Right 04/01/2014   Procedure: INSERTION PORT-A-CATH;  Surgeon: Donnice Bury, MD;  Location: [facility] OR;  Service: General;  Laterality: Right;   TONSILLECTOMY     Social History   Socioeconomic History   Marital status: Married    Spouse name: Ozell   Number of children: 2   Years of education: Not on file   Highest education level: Some college, no degree  Occupational History   Occupation: retired    Associate Professor: [employer]  Tobacco Use   Smoking status: Former    Current packs/day: 0.00    Average packs/day: 0.2 packs/day for 3.0 years (0.6 ttl pk-yrs)    Types: Cigarettes    Start date: 01/26/1971    Quit date: 01/25/1974    Years since quitting: 35.9  Smokeless tobacco: Never  Vaping Use   Vaping status: Never Used  Substance and Sexual Activity   Alcohol use: Yes    Alcohol/week: 2.0 standard drinks of alcohol    Types: 2 Glasses of wine per week    Comment: 2 glasses wine per week   Drug use: No   Sexual activity: Never  Other Topics Concern   Not on file  Social History Narrative   Patient is left-handed. She lives with her husband in a 2 story house. She drinks 1 glass of tea a day. She does not exercise.   Social Drivers of Health   Tobacco Use: Medium Risk (01/10/2024)   Patient History    Smoking Tobacco Use: Former    Smokeless Tobacco Use: Never    Passive Exposure: Not on Actuary Strain: Not on file  Food Insecurity: Not on file  Transportation Needs: Not on file  Physical Activity: Not on file  Stress: Not on file  Social Connections: Not on file  Intimate Partner Violence: Not on file  Depression (PHQ2-9): Not on file  Alcohol Screen: Not on file  Housing: Not on file  Utilities: Not on file  Health Literacy: Not on file   Family History  Problem Relation Age of Onset   Emphysema Mother    Heart disease Mother    Heart failure  Mother    Alcohol abuse Father    Liver disease Father    Melanoma Brother 30       more than 3, unk exact number   Celiac disease Daughter    Hashimoto's thyroiditis Daughter    Cancer Cousin        maybe had cancer- type unk   Tremor Cousin    Tremor Cousin    Asthma Other        grandson   Colon cancer Neg Hx    Esophageal cancer Neg Hx    Inflammatory bowel disease Neg Hx    Pancreatic cancer Neg Hx    Rectal cancer Neg Hx    Stomach cancer Neg Hx    I have reviewed her medical, social, and family history in detail and updated the electronic medical record as necessary.    PHYSICAL EXAMINATION  BP 110/68   Pulse 87   Ht 5' 5 (1.651 m)   Wt 156 lb 8 oz (71 kg)   BMI 26.04 kg/m  Wt Readings from Last 3 Encounters:  01/10/24 156 lb 8 oz (71 kg)  10/28/21 155 lb 12.8 oz (70.7 kg)  06/09/21 151 lb 1.6 oz (68.5 kg)  GEN: NAD, appears stated age, doesn't appear chronically ill PSYCH: Cooperative, without pressured speech EYE: Conjunctivae pink, sclerae anicteric ENT: MMM CV: Nontachycardic RESP: No audible wheezing GI: NABS, soft, NT/ND, without rebound MSK/EXT: No significant lower extremity edema SKIN: No jaundice NEURO:  Alert & Oriented x 3, no focal deficits   REVIEW OF DATA  I reviewed the following data at the time of this encounter:  GI Procedures and Studies  Reports an EGD at [facility] gastroenterology We will work on obtaining this  Laboratory Studies  Reviewed those in epic  Imaging Studies  No relevant studies to review   ASSESSMENT  Ms. Petrucci is a 73 y.o. female.  The patient is seen today for evaluation and management of:  1. At high risk for pancreatic cancer   2. Monoallelic mutation of CDKN2A gene   3. Cancer screening   4. Colon cancer screening  5. Gastroesophageal reflux disease, unspecified whether esophagitis present   6. Esophageal dysphagia    The patient is clinically and hemodynamically stable at this time.  The patient is  seen today for consideration of high risk pancreas cancer screening.  Based on CDKN2A, the patient meets criteria for high risk pancreas cancer screening.  The patient was educated that while we are hopeful that screening will allow us  to catch a pancreatic cancer at an early stage, and that earlier staging would likely improve long term outcomes, data continues to evolve.  Our high risk pancreatic cancer guidelines follow typical guidelines across country.  We establish a baseline status and alternate between MRI/MRCP plus endoscopic ultrasound.  Assuming all is well on the initial baseline, the next study would be 1 year later.  The risks of an EUS including intestinal perforation, bleeding, infection, aspiration, and medication effects were discussed as was the possibility it may not give a definitive diagnosis if a biopsy is performed.  When a biopsy of the pancreas is done as part of the EUS, there is an additional risk of pancreatitis at the rate of about 1-2%.  It was explained that procedure related pancreatitis is typically mild, although it can be severe and even life threatening, which is why we do not perform random pancreatic biopsies and only biopsy a lesion/area we feel is concerning enough to warrant the risk.  With this being said, it must also be understood that patients who are in the high risk pancreas cancer screening cohort, are at increased risk of undergoing further imaging studies and procedures, where in which patients in average risk/normal population may be able to be followed with longer intervals or have less endoscopic biopsies performed, thus placing them at slightly increased risk for potential complications.  It is recommended that patient have hemoglobin A1c done yearly (can be done via PCP).  If new onset diabetes is found, recommend consideration of earlier imaging or EUS within 3 months.  Tobacco use increases risk for pancreas cancer, so avoiding tobacco is critical.  High risk  screening can be considered for ending at age 60 but can individualized based on patient's overall medical conditions and continued if patient's overall health is such that their life expectancy is 5 to 10 years thereafter.  We will proceed with MRI/MRCP.  If all is well then EUS in 6 to 12 months.  In regards to the patient's colon cancer screening, she had a fit based test done last year and this coming year we will transition her to Cologuard testing.  We did discuss colon cancer screening as another option as she remains average risk and if negative she would be good for 10 years, but she wants to remain with Cologuard testing at this time.  The risks of an EUS including intestinal perforation, bleeding, infection, aspiration, and medication effects were discussed as was the possibility it may not give a definitive diagnosis if a biopsy is performed.  When a biopsy of the pancreas is done as part of the EUS, there is an additional risk of pancreatitis at the rate of about 1-2%.  It was explained that procedure related pancreatitis is typically mild, although it can be severe and even life threatening, which is why we do not perform random pancreatic biopsies and only biopsy a lesion/area we feel is concerning enough to warrant the risk.  She will update us  if her GERD symptoms progress, at which point when we do her endoscopic ultrasound we can perform  a dilation if necessary.  We will try to obtain the [facility] from earlier this year.  Esophageal manometry was discussed with the patient as well, but based on how she is doing currently is not clear that she needs this.  All patient questions were answered to the best of my ability, and the patient agrees to the aforementioned plan of action with follow-up as indicated.   PLAN  Proceed with high risk pancreas cancer screening - MRI/MRCP - If all is well, EUS in 6 months - Hemoglobin A1c per her report performed by PCP and will be  redone again this year (this should be done yearly) Cologuard to be ordered for her January 2026 PPI as needed as per patient Should dysphagia symptoms progress or worsen she will update us  and upper endoscopy with dilation can be pursued Loss Adjuster, [facility] records She will transition to [facility] gastroenterology for all of her GI care moving forward   Orders Placed This Encounter  Procedures   MR ABDOMEN MRCP W WO CONTAST   Cologuard    New Prescriptions   No medications on file   Modified Medications   No medications on file    Planned Follow Up No follow-ups on file.   Total Time in Face-to-Face and in Coordination of Care for patient including independent/personal interpretation/review of prior testing, medical history, examination, medication adjustment, communicating results with the patient directly, and documentation within the EHR is 45 minutes.   Aloha Finner, MD [facility] Advanced Endoscopy Office # [phone]     [1]  Allergies Allergen Reactions   Hydrocodone  Nausea And Vomiting   Tramadol Hives and Nausea And Vomiting   Codeine Nausea And Vomiting   Denosumab Other (See Comments)    Other Reaction(s): left sided jaw pain   Iodine Hives   Radiaplexrx [Skin Protectants, Misc.] Other (See Comments)    Contraindicated on pharmacy profile   Sulfa Antibiotics Hives   Tape Other (See Comments)    Redness, skin tear   Primidone  Other (See Comments)    Pt had first dose effect.  More of a known sensitivity and not true allergy

## 2024-01-12 DIAGNOSIS — Z9889 Other specified postprocedural states: Secondary | ICD-10-CM | POA: Diagnosis not present

## 2024-01-12 DIAGNOSIS — H35372 Puckering of macula, left eye: Secondary | ICD-10-CM | POA: Diagnosis not present

## 2024-01-12 DIAGNOSIS — H33332 Multiple defects of retina without detachment, left eye: Secondary | ICD-10-CM | POA: Diagnosis not present

## 2024-01-18 ENCOUNTER — Ambulatory Visit: Admitting: Orthopaedic Surgery

## 2024-01-18 ENCOUNTER — Ambulatory Visit (HOSPITAL_COMMUNITY)
Admission: RE | Admit: 2024-01-18 | Discharge: 2024-01-18 | Disposition: A | Discharge status: Home / Self Care | Source: Ambulatory Visit | Attending: Gastroenterology | Admitting: Gastroenterology

## 2024-01-18 DIAGNOSIS — Z129 Encounter for screening for malignant neoplasm, site unspecified: Secondary | ICD-10-CM | POA: Diagnosis not present

## 2024-01-18 DIAGNOSIS — Z9189 Other specified personal risk factors, not elsewhere classified: Secondary | ICD-10-CM | POA: Insufficient documentation

## 2024-01-18 DIAGNOSIS — K859 Acute pancreatitis without necrosis or infection, unspecified: Secondary | ICD-10-CM

## 2024-01-18 MED ORDER — GADOBUTROL 1 MMOL/ML IV SOLN
7.5000 mL | Freq: Once | INTRAVENOUS | Status: AC | PRN
  Administered 2024-01-18: 7.5 mL via INTRAVENOUS

## 2024-01-20 ENCOUNTER — Ambulatory Visit: Payer: Self-pay | Admitting: Gastroenterology

## 2024-01-23 ENCOUNTER — Other Ambulatory Visit: Payer: Self-pay | Admitting: Gastroenterology

## 2024-01-23 DIAGNOSIS — Z129 Encounter for screening for malignant neoplasm, site unspecified: Secondary | ICD-10-CM

## 2024-01-23 DIAGNOSIS — K859 Acute pancreatitis without necrosis or infection, unspecified: Secondary | ICD-10-CM

## 2024-01-23 DIAGNOSIS — Z9189 Other specified personal risk factors, not elsewhere classified: Secondary | ICD-10-CM

## 2024-01-24 ENCOUNTER — Ambulatory Visit: Payer: Self-pay | Admitting: Gastroenterology

## 2024-01-26 LAB — COLOGUARD: COLOGUARD: NEGATIVE

## 2024-02-15 ENCOUNTER — Other Ambulatory Visit: Payer: Self-pay

## 2024-02-15 NOTE — Progress Notes (Signed)
 Specialty Pharmacy Refill Coordination Note  Theresa Huff is a 74 y.o. female contacted today regarding refills of specialty medication(s) OnabotulinumtoxinA  (Botox )   Patient requested Courier to Provider Office   Delivery date: 02/27/24   Verified address: [facility] [address] [place], [address], [place]   Medication will be filled on: 02/24/24

## 2024-02-24 ENCOUNTER — Other Ambulatory Visit: Payer: Self-pay

## 2024-02-24 NOTE — Progress Notes (Signed)
 Due to impending winter weather conditions specialty medication(s) OnabotulinumtoxinA  (Botox )  will be filled 02/29/24 to be Courier to Provider Office by 03/01/24.

## 2024-02-28 DIAGNOSIS — I700 Atherosclerosis of aorta: Secondary | ICD-10-CM | POA: Insufficient documentation

## 2024-02-28 NOTE — Progress Notes (Unsigned)
 H/o Insomnia since 2012 Hot flashes/night sweats Labile mood  H/o breast cancer, ER/PR pos

## 2024-02-29 ENCOUNTER — Other Ambulatory Visit: Payer: Self-pay

## 2024-03-01 ENCOUNTER — Ambulatory Visit: Admitting: Obstetrics and Gynecology

## 2024-03-01 ENCOUNTER — Encounter: Payer: Self-pay | Admitting: Obstetrics and Gynecology

## 2024-03-01 VITALS — BP 116/80 | HR 80 | Ht 65.0 in | Wt 155.0 lb

## 2024-03-01 DIAGNOSIS — N951 Menopausal and female climacteric states: Secondary | ICD-10-CM

## 2024-03-01 DIAGNOSIS — Z1509 Genetic susceptibility to other malignant neoplasm: Secondary | ICD-10-CM

## 2024-03-01 MED ORDER — BUPROPION HCL ER (XL) 150 MG PO TB24: 150.0000 mg | ORAL_TABLET | Freq: Every day | ORAL | 3 refills | Status: AC

## 2024-03-01 MED ORDER — PROGESTERONE MICRONIZED 100 MG PO CAPS: 300.0000 mg | ORAL_CAPSULE | Freq: Every day | ORAL | 6 refills | Status: AC

## 2024-03-02 ENCOUNTER — Ambulatory Visit: Admitting: Neurology

## 2024-03-02 DIAGNOSIS — G243 Spasmodic torticollis: Secondary | ICD-10-CM

## 2024-03-02 MED ORDER — ONABOTULINUMTOXINA 100 UNITS IJ SOLR
300.0000 [IU] | Freq: Once | INTRAMUSCULAR | Status: AC
  Administered 2024-03-02: 290 [IU] via INTRAMUSCULAR

## 2024-03-02 NOTE — Procedures (Signed)
 Botulinum Clinic   Procedure Note Botox   Attending: Dr. Asberry Venessa Wickham  Preoperative Diagnosis(es): Cervical Dystonia  Result History  Doing well.  Discussed potential MCR changes coming with toxins  Current toxin:  onobotulinumtoxinA  Prior toxins: Failed Xeomin   Consent obtained from: The patient Benefits discussed included, but were not limited to decreased muscle tightness, increased joint range of motion, and decreased pain.  Risk discussed included, but were not limited pain and discomfort, bleeding, bruising, excessive weakness, venous thrombosis, muscle atrophy and dysphagia.  A copy of the patient medication guide was given to the patient which explains the blackbox warning.  Patients identity and treatment sites confirmed Yes.  .  Details of Procedure: Skin was cleaned with alcohol.  A 30 gauge, 25mm  needle was introduced to the target muscle, except for posterior splenius where 27 gauge, 1.5 inch needle used.   Prior to injection, the needle plunger was aspirated to make sure the needle was not within a blood vessel.  There was no blood retrieved on aspiration.    Following is a summary of the muscles injected  And the amount of Botulinum toxin used:   Dilution 0.9% preservative free saline mixed with 100 u Botox  type A (onobotunlinum toxin type A) to make 10 U per 0.1cc  Injections  Location Left  Right Units Number of sites        Sternocleidomastoid  60/20 80 2  Splenius Capitus, posterior approach 100  100 1  Splenius Capitus, lateral approach 40  40 1  Levator Scapulae      Trapezius 20/20/10 20 70         TOTAL UNITS:   290    Agent: Xeomin .  3 vials of onobotulinum toxin type A were used, each containing 100 units and freshly diluted with 1 mL of sterile, non-preserved saline   Total injected (Units): 290  Total wasted (Units): 10 waste   Pt tolerated procedure well without complications.   Reinjection is anticipated in 3 months.

## 2024-04-02 ENCOUNTER — Ambulatory Visit: Admitting: Orthopaedic Surgery

## 2024-04-12 ENCOUNTER — Ambulatory Visit: Admitting: Obstetrics and Gynecology

## 2024-06-01 ENCOUNTER — Ambulatory Visit: Payer: Self-pay | Admitting: Neurology
